# Patient Record
Sex: Female | Born: 1975 | Race: Black or African American | Hispanic: No | Marital: Married | Smoking: Never smoker
Health system: Southern US, Community
[De-identification: ages and names within clinical notes are randomized; demographics above are authoritative.]

## PROBLEM LIST (undated history)

## (undated) DIAGNOSIS — D6861 Antiphospholipid syndrome: Secondary | ICD-10-CM

## (undated) DIAGNOSIS — N6009 Solitary cyst of unspecified breast: Secondary | ICD-10-CM

## (undated) DIAGNOSIS — N938 Other specified abnormal uterine and vaginal bleeding: Secondary | ICD-10-CM

## (undated) DIAGNOSIS — E669 Obesity, unspecified: Secondary | ICD-10-CM

## (undated) DIAGNOSIS — D219 Benign neoplasm of connective and other soft tissue, unspecified: Secondary | ICD-10-CM

## (undated) DIAGNOSIS — I82409 Acute embolism and thrombosis of unspecified deep veins of unspecified lower extremity: Secondary | ICD-10-CM

## (undated) DIAGNOSIS — D649 Anemia, unspecified: Secondary | ICD-10-CM

## (undated) HISTORY — DX: Obesity, unspecified: E66.9

## (undated) HISTORY — DX: Solitary cyst of unspecified breast: N60.09

## (undated) HISTORY — DX: Benign neoplasm of connective and other soft tissue, unspecified: D21.9

## (undated) HISTORY — DX: Acute embolism and thrombosis of unspecified deep veins of unspecified lower extremity: I82.409

## (undated) HISTORY — PX: MYOMECTOMY: SHX85

---

## 2006-08-03 ENCOUNTER — Ambulatory Visit: Payer: Self-pay | Admitting: Internal Medicine

## 2006-08-07 ENCOUNTER — Ambulatory Visit: Payer: Self-pay | Admitting: Internal Medicine

## 2006-08-17 ENCOUNTER — Ambulatory Visit: Payer: Self-pay | Admitting: Internal Medicine

## 2006-09-21 ENCOUNTER — Ambulatory Visit (HOSPITAL_COMMUNITY): Admission: RE | Admit: 2006-09-21 | Discharge: 2006-09-21 | Payer: Self-pay | Admitting: Obstetrics & Gynecology

## 2006-09-21 ENCOUNTER — Ambulatory Visit: Payer: Self-pay | Admitting: Obstetrics & Gynecology

## 2006-09-27 ENCOUNTER — Ambulatory Visit: Payer: Self-pay | Admitting: Internal Medicine

## 2006-09-28 ENCOUNTER — Encounter: Payer: Self-pay | Admitting: Internal Medicine

## 2006-10-03 ENCOUNTER — Ambulatory Visit: Payer: Self-pay

## 2006-10-05 ENCOUNTER — Ambulatory Visit: Payer: Self-pay | Admitting: Cardiology

## 2006-10-09 ENCOUNTER — Encounter: Payer: Self-pay | Admitting: Family Medicine

## 2006-10-09 ENCOUNTER — Ambulatory Visit: Payer: Self-pay | Admitting: Family Medicine

## 2006-10-15 ENCOUNTER — Ambulatory Visit (HOSPITAL_COMMUNITY): Admission: RE | Admit: 2006-10-15 | Discharge: 2006-10-15 | Payer: Self-pay | Admitting: Obstetrics & Gynecology

## 2006-10-15 ENCOUNTER — Ambulatory Visit: Payer: Self-pay | Admitting: Family Medicine

## 2006-11-06 ENCOUNTER — Ambulatory Visit: Payer: Self-pay | Admitting: Family Medicine

## 2006-11-06 ENCOUNTER — Ambulatory Visit (HOSPITAL_COMMUNITY): Admission: RE | Admit: 2006-11-06 | Discharge: 2006-11-06 | Payer: Self-pay | Admitting: Obstetrics & Gynecology

## 2006-11-26 ENCOUNTER — Ambulatory Visit (HOSPITAL_COMMUNITY): Admission: RE | Admit: 2006-11-26 | Discharge: 2006-11-26 | Payer: Self-pay | Admitting: Obstetrics & Gynecology

## 2006-12-03 ENCOUNTER — Ambulatory Visit: Payer: Self-pay | Admitting: Gynecology

## 2006-12-18 ENCOUNTER — Ambulatory Visit: Payer: Self-pay | Admitting: Family Medicine

## 2006-12-24 ENCOUNTER — Ambulatory Visit (HOSPITAL_COMMUNITY): Admission: RE | Admit: 2006-12-24 | Discharge: 2006-12-24 | Payer: Self-pay | Admitting: Obstetrics & Gynecology

## 2007-01-03 ENCOUNTER — Ambulatory Visit: Payer: Self-pay | Admitting: Obstetrics & Gynecology

## 2007-01-22 ENCOUNTER — Ambulatory Visit: Payer: Self-pay | Admitting: Family Medicine

## 2007-01-22 ENCOUNTER — Ambulatory Visit (HOSPITAL_COMMUNITY): Admission: RE | Admit: 2007-01-22 | Discharge: 2007-01-22 | Payer: Self-pay | Admitting: Obstetrics & Gynecology

## 2007-02-19 ENCOUNTER — Ambulatory Visit (HOSPITAL_COMMUNITY): Admission: RE | Admit: 2007-02-19 | Discharge: 2007-02-19 | Payer: Self-pay | Admitting: Obstetrics & Gynecology

## 2007-02-20 ENCOUNTER — Ambulatory Visit: Payer: Self-pay | Admitting: Obstetrics & Gynecology

## 2007-02-24 ENCOUNTER — Inpatient Hospital Stay (HOSPITAL_COMMUNITY): Admission: AD | Admit: 2007-02-24 | Discharge: 2007-02-24 | Payer: Self-pay | Admitting: Obstetrics and Gynecology

## 2007-02-26 ENCOUNTER — Ambulatory Visit: Payer: Self-pay | Admitting: Family Medicine

## 2007-03-12 ENCOUNTER — Ambulatory Visit: Payer: Self-pay | Admitting: Family Medicine

## 2007-03-19 ENCOUNTER — Ambulatory Visit: Payer: Self-pay | Admitting: Family Medicine

## 2007-03-19 ENCOUNTER — Ambulatory Visit (HOSPITAL_COMMUNITY): Admission: RE | Admit: 2007-03-19 | Discharge: 2007-03-19 | Payer: Self-pay | Admitting: Obstetrics & Gynecology

## 2007-03-23 ENCOUNTER — Inpatient Hospital Stay (HOSPITAL_COMMUNITY): Admission: AD | Admit: 2007-03-23 | Discharge: 2007-03-23 | Payer: Self-pay | Admitting: Family Medicine

## 2007-03-23 ENCOUNTER — Ambulatory Visit: Payer: Self-pay | Admitting: Family Medicine

## 2007-03-28 ENCOUNTER — Inpatient Hospital Stay (HOSPITAL_COMMUNITY): Admission: RE | Admit: 2007-03-28 | Discharge: 2007-03-31 | Payer: Self-pay | Admitting: Family Medicine

## 2007-03-28 ENCOUNTER — Ambulatory Visit: Payer: Self-pay | Admitting: Family Medicine

## 2007-03-31 ENCOUNTER — Encounter: Payer: Self-pay | Admitting: Internal Medicine

## 2007-04-01 ENCOUNTER — Telehealth: Payer: Self-pay | Admitting: Family Medicine

## 2007-04-01 ENCOUNTER — Encounter: Admission: RE | Admit: 2007-04-01 | Discharge: 2007-05-01 | Payer: Self-pay | Admitting: Family Medicine

## 2007-04-02 ENCOUNTER — Ambulatory Visit: Payer: Self-pay | Admitting: Family Medicine

## 2007-04-02 LAB — CONVERTED CEMR LAB: Prothrombin Time: 23.6 s

## 2007-04-03 ENCOUNTER — Ambulatory Visit: Payer: Self-pay | Admitting: Obstetrics & Gynecology

## 2007-04-04 ENCOUNTER — Ambulatory Visit: Payer: Self-pay | Admitting: Internal Medicine

## 2007-04-04 LAB — CONVERTED CEMR LAB: Prothrombin Time: 16.8 s

## 2007-04-08 ENCOUNTER — Ambulatory Visit: Payer: Self-pay | Admitting: Gynecology

## 2007-04-09 ENCOUNTER — Ambulatory Visit: Payer: Self-pay | Admitting: Family Medicine

## 2007-04-10 DIAGNOSIS — Z8672 Personal history of thrombophlebitis: Secondary | ICD-10-CM

## 2007-04-11 ENCOUNTER — Ambulatory Visit: Admission: RE | Admit: 2007-04-11 | Discharge: 2007-04-11 | Payer: Self-pay | Admitting: Family Medicine

## 2007-04-16 ENCOUNTER — Ambulatory Visit: Payer: Self-pay | Admitting: Family Medicine

## 2007-04-16 ENCOUNTER — Ambulatory Visit: Payer: Self-pay | Admitting: Internal Medicine

## 2007-04-21 ENCOUNTER — Ambulatory Visit: Payer: Self-pay | Admitting: Surgery

## 2007-04-21 ENCOUNTER — Other Ambulatory Visit: Payer: Self-pay | Admitting: Family Medicine

## 2007-04-21 ENCOUNTER — Ambulatory Visit: Payer: Self-pay | Admitting: Oncology

## 2007-04-21 ENCOUNTER — Inpatient Hospital Stay (HOSPITAL_COMMUNITY): Admission: EM | Admit: 2007-04-21 | Discharge: 2007-04-23 | Payer: Self-pay | Admitting: Internal Medicine

## 2007-04-21 ENCOUNTER — Ambulatory Visit: Payer: Self-pay | Admitting: Internal Medicine

## 2007-04-22 ENCOUNTER — Encounter: Payer: Self-pay | Admitting: Internal Medicine

## 2007-04-23 ENCOUNTER — Ambulatory Visit: Payer: Self-pay | Admitting: Oncology

## 2007-05-02 ENCOUNTER — Encounter: Admission: RE | Admit: 2007-05-02 | Discharge: 2007-05-31 | Payer: Self-pay | Admitting: Family Medicine

## 2007-05-03 ENCOUNTER — Encounter (INDEPENDENT_AMBULATORY_CARE_PROVIDER_SITE_OTHER): Payer: Self-pay | Admitting: *Deleted

## 2007-05-09 ENCOUNTER — Ambulatory Visit: Payer: Self-pay | Admitting: Family Medicine

## 2007-05-22 ENCOUNTER — Encounter (INDEPENDENT_AMBULATORY_CARE_PROVIDER_SITE_OTHER): Payer: Self-pay | Admitting: *Deleted

## 2007-05-23 LAB — PROTEIN C ACTIVITY: Protein C Activity: 122 % (ref 75–133)

## 2007-06-01 ENCOUNTER — Encounter: Admission: RE | Admit: 2007-06-01 | Discharge: 2007-07-01 | Payer: Self-pay | Admitting: Family Medicine

## 2007-06-13 ENCOUNTER — Ambulatory Visit: Payer: Self-pay | Admitting: Obstetrics & Gynecology

## 2007-11-21 ENCOUNTER — Ambulatory Visit: Payer: Self-pay | Admitting: Oncology

## 2007-11-25 ENCOUNTER — Encounter: Payer: Self-pay | Admitting: Internal Medicine

## 2007-11-25 LAB — CBC & DIFF AND RETIC
Basophils Absolute: 0 10*3/uL (ref 0.0–0.1)
EOS%: 1 % (ref 0.0–7.0)
HCT: 39.7 % (ref 34.8–46.6)
HGB: 13.8 g/dL (ref 11.6–15.9)
IRF: 0.26 (ref 0.130–0.330)
MCH: 26.3 pg (ref 26.0–34.0)
MONO#: 0.4 10*3/uL (ref 0.1–0.9)
NEUT%: 40.9 % (ref 39.6–76.8)
Platelets: 273 10*3/uL (ref 145–400)
lymph#: 2 10*3/uL (ref 0.9–3.3)

## 2007-11-25 LAB — HEPARIN ANTI-XA: Heparin LMW: 1.06 IU/mL

## 2007-11-25 LAB — FERRITIN: Ferritin: 10 ng/mL (ref 10–291)

## 2007-12-04 ENCOUNTER — Encounter: Payer: Self-pay | Admitting: Family Medicine

## 2007-12-04 ENCOUNTER — Ambulatory Visit: Payer: Self-pay | Admitting: Family Medicine

## 2007-12-12 ENCOUNTER — Ambulatory Visit: Payer: Self-pay | Admitting: Family Medicine

## 2007-12-12 ENCOUNTER — Encounter: Payer: Self-pay | Admitting: Internal Medicine

## 2008-01-23 ENCOUNTER — Ambulatory Visit (HOSPITAL_COMMUNITY): Admission: RE | Admit: 2008-01-23 | Discharge: 2008-01-23 | Payer: Self-pay | Admitting: Gynecology

## 2008-02-04 ENCOUNTER — Ambulatory Visit: Payer: Self-pay | Admitting: Family Medicine

## 2008-02-11 ENCOUNTER — Ambulatory Visit: Payer: Self-pay | Admitting: Family Medicine

## 2008-02-11 ENCOUNTER — Encounter (INDEPENDENT_AMBULATORY_CARE_PROVIDER_SITE_OTHER): Payer: Self-pay | Admitting: Internal Medicine

## 2008-03-24 ENCOUNTER — Ambulatory Visit (HOSPITAL_COMMUNITY): Admission: RE | Admit: 2008-03-24 | Discharge: 2008-03-24 | Payer: Self-pay | Admitting: Gynecology

## 2008-04-06 ENCOUNTER — Ambulatory Visit: Payer: Self-pay | Admitting: Oncology

## 2008-04-08 ENCOUNTER — Encounter: Payer: Self-pay | Admitting: Internal Medicine

## 2008-04-08 ENCOUNTER — Ambulatory Visit (HOSPITAL_COMMUNITY): Admission: RE | Admit: 2008-04-08 | Discharge: 2008-04-08 | Payer: Self-pay | Admitting: Oncology

## 2008-05-22 ENCOUNTER — Ambulatory Visit: Payer: Self-pay | Admitting: Family Medicine

## 2008-05-22 DIAGNOSIS — J069 Acute upper respiratory infection, unspecified: Secondary | ICD-10-CM | POA: Insufficient documentation

## 2008-05-25 ENCOUNTER — Ambulatory Visit: Payer: Self-pay | Admitting: Family Medicine

## 2008-05-25 DIAGNOSIS — J019 Acute sinusitis, unspecified: Secondary | ICD-10-CM

## 2009-02-25 ENCOUNTER — Encounter: Payer: Self-pay | Admitting: Obstetrics & Gynecology

## 2009-02-25 ENCOUNTER — Ambulatory Visit: Payer: Self-pay | Admitting: Obstetrics & Gynecology

## 2009-03-10 ENCOUNTER — Ambulatory Visit (HOSPITAL_COMMUNITY): Admission: RE | Admit: 2009-03-10 | Discharge: 2009-03-10 | Payer: Self-pay | Admitting: Obstetrics and Gynecology

## 2010-03-03 ENCOUNTER — Ambulatory Visit: Payer: Self-pay | Admitting: Family Medicine

## 2010-03-03 DIAGNOSIS — B359 Dermatophytosis, unspecified: Secondary | ICD-10-CM | POA: Insufficient documentation

## 2010-03-08 ENCOUNTER — Ambulatory Visit: Payer: Self-pay | Admitting: Obstetrics & Gynecology

## 2010-07-08 ENCOUNTER — Ambulatory Visit (HOSPITAL_COMMUNITY): Admission: RE | Admit: 2010-07-08 | Discharge: 2010-07-08 | Payer: Self-pay | Admitting: Obstetrics & Gynecology

## 2010-07-08 ENCOUNTER — Encounter: Admission: RE | Admit: 2010-07-08 | Discharge: 2010-07-08 | Payer: Self-pay | Admitting: Obstetrics & Gynecology

## 2010-09-19 ENCOUNTER — Encounter: Payer: Self-pay | Admitting: Oncology

## 2010-09-27 NOTE — Assessment & Plan Note (Signed)
Summary: RASH/DLO   Vital Signs:  Patient profile:   35 year old female Height:      68 inches Weight:      201.0 pounds BMI:     30.67 Temp:     98.6 degrees F oral Pulse rate:   88 / minute Pulse rhythm:   regular BP sitting:   110 / 80  (left arm) Cuff size:   regular  Vitals Entered By: Benny Lennert CMA Duncan Dull) (March 03, 2010 3:27 PM)  History of Present Illness: Chief complaint rash   35 year old female:  L arm and torso  several weeks, itchy, circular with central clearing daughter with dry, itchy scalp.   ROS: o/w no fever, chills, sweats  GEN: Well-developed,well-nourished,in no acute distress; alert,appropriate and cooperative throughout examination HEENT: Normocephalic and atraumatic without obvious abnormalities. No apparent alopecia or balding. Ears, externally no deformities PULM: Breathing comfortably in no respiratory distress EXT: No clubbing, cyanosis, or edema PSYCH: Normally interactive. Cooperative during the interview. Pleasant. Friendly and conversant. Not anxious or depressed appearing. Normal, full affect.   SKIN: circular elevated scale o nL forearm and torso  Allergies (verified): No Known Drug Allergies  Past History:  Past medical, surgical, family and social histories (including risk factors) reviewed, and no changes noted (except as noted below).  Past Medical History: Reviewed history from 04/10/2007 and no changes required. DVT--9/07  Past Surgical History: Reviewed history from 02/11/2008 and no changes required. DVT L--9/07--annd R --9/08 Myomectomy 3/07 C'secstion 7/08  Family History: Reviewed history from 04/10/2007 and no changes required. Father: Alive Mother: Alive Siblings: One brother, alive No CAD or HTN DM in Pat GM No breast or colon cancer No Hx DVT/PE  Social History: Reviewed history from 04/10/2007 and no changes required. 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

## 2011-01-10 NOTE — Assessment & Plan Note (Signed)
NAME:  [name], [name]            ACCOUNT NO.:  [id number]   MEDICAL RECORD NO.:  [id number]          PATIENT TYPE:  POB   LOCATION:  [facility]         FACILITY:  [facility]   PHYSICIAN:  Scheryl Darter, MD       DATE OF BIRTH:  Apr 27, 1976   DATE OF SERVICE:  02/25/2009                                  CLINIC NOTE   CHIEF COMPLAINT:  Need routine physical exam.   The patient is a 35 year old black female, gravida 1, para 1, last  menstrual period February 11, 2009.  She is not currently using any  contraception.  She has considered using Mirena in the past, still  unsure which she wants to use for birth control.  She is followed with a  4.8 x 3.9 cm left adnexal cyst and the repeat ultrasound March 24, 2008  showed that this was stable.  It was recommended that she follow up 6  months after that for another ultrasound.  She has no complaints of  pain.  Her cycles are regular and complains heavy flow.   PAST MEDICAL HISTORY:  1. Bilateral deep venous thrombosis.  2. Fibroid uterus.  3. Bilateral breast cyst.   SURGICAL HISTORY:  1. Myomectomy.  2. Cesarean section.   FAMILY HISTORY:  Significant for fibroids.   SOCIAL HISTORY:  The patient is married and she denies alcohol, tobacco,  or drug use.  She is a Education officer, museum.   MEDICATIONS:  None.   ALLERGIES:  No known drug allergies.   REVIEW OF SYSTEMS:  Negative for any breast pain, abdominal pain, heavy  bleeding, vaginal discharge, or urinary pain.   PHYSICAL EXAMINATION:  GENERAL:  The patient is in no acute distress and  affect is normal.  VITAL SIGNS:  Weight 199 pounds, height 5 feet 9 inches, BP 132/93,  pulse 80.  CHEST:  Clear.  HEART:  Regular rhythm.  No thyromegaly.  BREASTS:  Without masses and nontender.  ABDOMEN:  Soft, nontender, no mass.  EXTREMITIES:  Show no swelling.  She has some mild early venous stasis  changes around her left ankle.  PELVIC:  External genitalia, vagina, and cervix  normal.  Pap was done.  Uterus normal size.  No adnexal masses or tenderness.   IMPRESSION:  1. History of left ovarian cyst with possible thin septum  2. History of fibrocystic breast changes.   PLAN:  The patient will have a followup ultrasound of the pelvis.  She  needs a repeat bilateral mammogram.  She will notify us if she decides  she wants to schedule Mirena insertion that had been discussed before.      Scheryl Darter, MD     JA/MEDQ  D:  02/25/2009  T:  02/26/2009  Job:  [id number]

## 2011-01-10 NOTE — H&P (Signed)
[name], [name]            ACCOUNT NO.:  [id number]   MEDICAL RECORD NO.:  [id number]          PATIENT TYPE:  MAT   LOCATION:  [facility]                          [facility]ACILITY:  [facility]   PHYSICIAN:  Hollice Espy, M.D.DATE OF BIRTH:  [birth year]   DATE OF ADMISSION:  04/21/2007  DATE OF DISCHARGE:  04/21/2007                              HISTORY & PHYSICAL   ATTENDING PHYSICIAN:  Vikki Ports A. Felicity Coyer, M.D.   PRIMARY CARE PHYSICIAN:  Karie Schwalbe, M.D.   CHIEF COMPLAINT:  Left-sided groin pain.   HISTORY OF PRESENT ILLNESS:  The patient is a 35 year old African  American female who has had previous history of DVT and underwent a  cesarean section at 36 weeks on March 28, 2007.  She was discharged from  the hospital on March 31, 2007.  Her Coumadin, which had been held  during her pregnancy, was resumed at the time of discharge.  Postpartum  she was doing well with no complications.  However, over the last one to  two days, she started having problems with severe pain in her left  groin.  Again she has a previous history of DVT on the right lower  extremity.  She went to [facility] and there in the emergency  room,  she was evaluated and found to have by ultrasound an acute DVT  involving the right saphenous, common femoral, femoral, profundus, and  popliteal.  I spoke with the physician who was evaluating the patient  and after an INR was checked, it was found to be 3.2.  The patient has  failed outpatient therapy despite therapeutic Coumadin still developed  an acute DVT and was at high risk for possible pulmonary embolus.  In  addition, in discussion with the [facility] physician, there was no further OB  needs for this patient at this time, so it was felt best that she  transfer over to [facility] for further evaluation.  The patient was  arranged as a direct transfer by [facility] and brought to 4700 floor.  Currently she is doing well.  She complains of some left-sided groin  pain but otherwise she is doing well.  She denies any headaches, vision  changes, dysphagia, chest pain, palpitations, shortness of breath,  wheeze, cough, abdominal pain, hematuria, dysuria, constipation,  diarrhea. No focal extremity numbness, weakness or pain other than  described above.  Review of systems is otherwise negative.   PAST MEDICAL HISTORY:  1. History of a fibroid uterus, previous myomectomy.  2. DVT of the right lower extremity.  3. Intrauterine pregnancy, status post cesarean section at 36 weeks on      March 28, 2007.   MEDICATIONS:  1. Percocet p.r.n.  2. Colace 100 mg b.i.d.  3. Coumadin 5 mg at bedtime.  4. Lovenox 80 subcutaneously b.i.d. which she discontinued when she      was therapeutic on Coumadin.  5. Prenatal vitamins daily.   ALLERGIES:  No known drug allergies.   SOCIAL HISTORY:  She denies any tobacco, alcohol or drug use.   FAMILY HISTORY:  A grandmother was on Coumadin for unknown reasons.  PHYSICAL EXAMINATION:  VITAL SIGNS:  On admission, temperature 97, heart  rate 103, blood pressure 115/82, respirations 18, oxygen saturation 98%  on room air.  GENERAL:  The patient is alert and oriented x3, in no apparent distress.  HEENT:  Normocephalic, atraumatic.  Mucous membranes are moist.  No  carotid bruits.  HEART:  Regular rhythm.  Mild tachycardia.  LUNGS:  Clear to auscultation bilaterally.  ABDOMEN:  Soft, nontender, nondistended.  Positive bowel sounds.  Occasional high-pitched bowel sounds.  EXTREMITIES:  No clubbing or cyanosis.  She has a trace pitting edema on  the left side from the mid calf down.   LABORATORY DATA:  PT 34.1, INR 3.2.  White count 6.5, no shift.  Hemoglobin 11.1, hematocrit 35.  MCV 76, platelet count 368.  Urinalysis  unremarkable.   ASSESSMENT/PLAN:  1. Acute deep venous thrombosis despite therapeutic Coumadin level.      Clearly the patient has failed outpatient therapy.  Plan to admit      the patient,  monitor her on telemetry and I have asked for a      consult from interventional radiology for placement of a IVC      filter.  In addition, we will hold her Coumadin and let her levels      trend down to acceptable range for interventional radiology.  In      the meantime, the decision should be made as to whether or not to      resume the patient on Coumadin with placement of this filter.  Once      she is off Coumadin, we will consider as a possibility do genetic      screening  for clot formation although she has been recently      pregnant, but this does not explain why she was clot forming      despite therapeutic INR.  2. Status post pregnancy, stable.      Hollice Espy, M.D.  Electronically Signed     SKK/MEDQ  D:  04/22/2007  T:  04/22/2007  Job:  [id number]   cc:   Shelbie Proctor. Shawnie Pons, M.D.  Karie Schwalbe, MD

## 2011-01-10 NOTE — Assessment & Plan Note (Signed)
NAME:  [name], [name]            ACCOUNT NO.:  [id number]   MEDICAL RECORD NO.:  [id number]          PATIENT TYPE:  POB   LOCATION:  [facility]         FACILITY:  [facility]   PHYSICIAN:  Tinnie Gens, MD        DATE OF BIRTH:  1976/08/15   DATE OF SERVICE:  12/04/2007                                  CLINIC NOTE   CHIEF COMPLAINT:  Yearly exam.   HISTORY OF PRESENT ILLNESS:  The patient is a 35 year old gravida 1,  para 1, who is status post C-section for a previous myomectomy in August  2008.  Postpartum, she went on Coumadin.  She had a lot of trouble with  bleeding related to Lovenox plus Coumadin.  She had her postpartum visit  here and was going to come back for IUD insertion, but subsequently  developed a new blood clot in her opposite leg, the right one.  She was  hospitalized and has since been on Lovenox 120 mg subcu daily.  She  supposedly is on that until at least August.  She sees Dr. Darnelle Catalan with  Hematology/Oncology for this problem.  The patient does not use any  birth control, except occasional condom use.  She is not anxious to get  pregnant at this time.   The patient also complains of abdominal pain that comes with her cycle  and lasts approximately 4 days post cycle.  It has been going on since  her February cycle.  Her cycles are not heavy.  She has no significant  bleeding.   PAST MEDICAL HISTORY:  DVT.   PAST SURGICAL HISTORY:  1. Myomectomy.  2. C-section.   FAMILY HISTORY:  Significant for fibroid.   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   MEDICATIONS:  Lovenox 120 mg subcu daily.   ALLERGIES:  NONE KNOWN.   OBSTETRICAL HISTORY:  G1, P1, with one C-section in August 2008.   GYN HISTORY:  No history of abnormal Paps.   REVIEW OF SYSTEMS:  A 14 point review of systems was reviewed and is  negative for headache, vision changes, chest pain,  shortness of breath,  hemoptysis, breast discomfort, breast masses, abdominal pain, nausea,  vomiting, diarrhea, constipation, trouble passing her urine.  No vaginal  discharge.  She does report pain as described in the HPI.   PHYSICAL EXAMINATION:  VITAL SIGNS:  Are as noted in the chart.  GENERAL:  She is a well-developed, well-nourished female in no acute  distress.  HEENT:  Normocephalic, atraumatic.  Sclerae anicteric.  NECK:  Supple.  Normal thyroid.  LUNGS:  Clear bilaterally.  CV:  Regular rate and rhythm.  No rubs, gallops or murmurs.  ABDOMEN:  Soft, nontender, nondistended, C-section scar is well-healed.  EXTREMITIES:  No cyanosis.  Trace edema.  She has vascular changes,  especially in the left greater than right leg with dilation of small  capillary vessels on the foot and ankle.  BREASTS:  Symmetric with everted nipples.  No masses.  No  supraclavicular or axillary adenopathy.  GU:  Normal external  female genitalia, BUS normal.  Vagina is pink and  rugated.  Cervix is nulliparous and anterior.  The uterus is retroverted  and firm, but not overly enlarged.  No adnexal mass or tenderness was  appreciated.   IMPRESSION:  1. Physical examination.  2. Cycle related low pelvic pain for the past to 2 months.  3. Undesired fertility.   PLAN:  1. Pap smear today.  2. Pelvic sonogram.  3. Info given about Mirena versus copper T.  The patient is to return      with her cycle for insertion of this.           ______________________________  Tinnie Gens, MD     TP/MEDQ  D:  12/04/2007  T:  12/04/2007  Job:  [id number]

## 2011-01-10 NOTE — Discharge Summary (Signed)
NAME:  [name], [name]            ACCOUNT NO.:  [id number]   MEDICAL RECORD NO.:  [id number]          PATIENT TYPE:  INP   LOCATION:  6711                         FACILITY:  [facility]   PHYSICIAN:  Valerie A. Felicity Coyer, MDDATE OF BIRTH:  October 29, 1975   DATE OF ADMISSION:  04/21/2007  DATE OF DISCHARGE:                               DISCHARGE SUMMARY   DISCHARGE DIAGNOSIS:  1. Extensive right lower extremity DVT, question hypercoagulable      state, question Coumadin failure, to continue solo Lovenox therapy      without Coumadin until follow-up with hematology, see below.  2. Small bilateral PEs by CT angio 08/25 asymptomatic without chest,      pain without shortness of breath, without hypoxia or pleurisy.  3. Anemia multifactorial.  4. Recent postpartum state necessitating delivery July 31.  Discharge      medications include Lovenox 80 mg subcu q.12h until supply gone,      then to begin Lovenox 120 mg subcu q.24 h for planned therapy of      approximately 1 year anticoagulation, or as to be determined by      hematologist Dr. Darnelle Catalan  5. Discontinue Coumadin.  6. Tylenol #3 one to two p.o. q.4 h p.r.n. pain.  The patient      understands she is to discontinue any form of Coumadin and will not      resume any form of oral contraceptive pills and postpartum state.   CONSULTATIONS THIS HOSPITALIZATION:  Hematology, Dr. Marikay Alar Magrinat.  Hospital follow-up will be with primary care physician Dr. Tillman Abide.  The patient to call for routine follow-up with the next 3-4  weeks, also to follow up with hematologist, Dr. Darnelle Catalan in mid  September, presumably September 22.  The office will call and confirm  this day and time with the patient later this week.   CONDITION ON DISCHARGE:  Hemodynamically stable, asymptomatic and pain  controlled, with the understanding of need for treatment and plans for  follow-up regarding anticoagulation issues.   HOSPITAL COURSE BY PROBLEM:  1. Acute  DVT.  The patient is a 35 year old woman with previous      history of DVT in September 2007 prepregnancy who was then treated      with twice daily Lovenox throughout her pregnancy until cesarean      delivery recently July 31, at which time she was then resumed on      p.o. Coumadin.  She says her doses have been variable due to      fluctuating INR sometimes too high and sometimes too low, most      recently 2 levels of INR 1.4 on August 19.  However, in 3 days      prior to presentation at this time she began to develop an ache and      pain in her right groin similar to that of her previous DVT on the      left side last summer so she went to [facility] for      evaluation where she was diagnosed with extensive DVT despite  having at that time an INR 3.3.  She was referred to [facility] for further evaluation and treatment for presumed failure      of Coumadin therapy with an acute DVT.  Initially plans were      arranged for an IVC filter,  however due to concerns that this      would not adequately prevent development of thromboembolism and      only prevent symptomatic pulmonary embolism she was continued on      full-dose Lovenox as well as Coumadin pending a hematology      evaluation.  Dr. Darnelle Catalan kindly saw the patient the evening of      August 25 who agreed that this may not represent a true Coumadin      failure and recommended further blood testing of her      hypercoagulable state as far as the Factor II levels  and for a      lupus anticoagulant given her elevated PTT as well.  He recommended      discontinuation of Coumadin altogether at this time and continuing      full-dose Lovenox every 24 hours for the time course at least 1      year, at which point further decisions will be made regarding      observation off anticoagulation versus continue lifelong treatment.      He agreed there was no clear indication for a filter at this time      and  felt the patient was stable for discharge home to continue this      treatment of full Lovenox therapy with outpatient follow-up in the      next several weeks to follow up on these lab tests for further      establishment of planned follow-up. Given the patient's newborn      daughter and hemodynamic and pulmonary stability she is anxious for      discharge home.  For completeness of evaluation we did do a CT test      which also confirmed  bilateral small PEs which are generally      asymptomatic as noted above, which was then discussed with the      patient who understands diagnosis and need for absolute compliance      with Lovenox therapy as instructed.  All this being said, we have      agreed for discharge home to continue general bedrest with slow      increase in activity, maintaining full Lovenox therapy at this time      and close followup with hematology who will decide on further      management.  Coumadin will be discontinued at this time and she      will continue Tylenol #3 p.r.n. pain.  This has been discussed with      the patient as well as consultant Dr. Darnelle Catalan prior to discharge.      Valerie A. Felicity Coyer, MD  Electronically Signed     VAL/MEDQ  D:  04/23/2007  T:  04/23/2007  Job:  [id number]

## 2011-01-10 NOTE — Discharge Summary (Signed)
NAME:  [name], [name]            ACCOUNT NO.:  [id number]   MEDICAL RECORD NO.:  [id number]          PATIENT TYPE:  INP   LOCATION:  9124                          [facility]ACILITY[facility]   PHYSICIAN:  Tanya S. Shawnie Pons, M.D.   DATE OF BIRTH:  [birth year]   DATE OF ADMISSION:  03/28/2007  DATE OF DISCHARGE:  03/31/2007                               DISCHARGE SUMMARY   FINAL DIAGNOSES:  1. Intrauterine pregnancy at 36 weeks.  2. Previous myomectomy.  3. History of a deep vein thrombosis.  4. Fibroid uterus, previous myomectomy.   PERTINENT LABS:  A preoperative hemoglobin of 12.2, postoperative  hemoglobin of 9.8, normal PT, PTT, INR and bleeding time.  Blood type is  B positive, RPR is nonreactive.   REASON FOR ADMISSION:  Briefly, patient is a 35 year old gravida 1 who  had previous DVT on oral contraceptives who then got pregnant 3 months  after her DVT and stopped her Coumadin and was on Lovenox for this  pregnancy after propagation of the clot.  The patient had a history of  previous myomectomy and needed a cesarean section.  Given the previous  myomectomy it was felt that cesarean section should be done early and  this was done at 36 weeks.   HOSPITAL COURSE:  The patient was admitted, underwent primary low  transverse C-section without difficulty, which resulted in a viable  female infant.  Postoperatively, she was transferred to the floor where  she was ambulating well, her Foley was discontinued, she was able to  void without difficulty, she was tolerating p.o.'s and had restarted her  Lovenox and Coumadin.  These were restarted approximately 18 hours post  surgery.  She tolerated both medications well and is discharged home on  postoperative day #3.   DISCHARGE DISPOSITION AND CONDITION:  The patient is discharged home in  good condition.  Followup care will be at [facility] office in 6 weeks  for a postpartum check and a visit April 02, 2007, at 11:30, with her  primary care  doctor at [facility], to follow her INR, which should be  ready to be checked at that point and to essentially discontinue her  Lovenox.   DISCHARGE MEDICATIONS:  1. Percocet 5/325, 1-2 p.o. q.4-6h. p.r.n. pain, #48.  2. Colace 100 mg one p.o. b.i.d.  3. Coumadin 5 mg p.o. q.h.s.  4. Lovenox 80 mg subcu b.i.d.  5. Prenatal vitamin one p.o. daily.   She also has pain management followup arranged at her primary care  physician office as well.  Staples were discontinued prior to discharge  and wound care is discussed with this patient.      Shelbie Proctor. Shawnie Pons, M.D.  Electronically Signed     TSP/MEDQ  D:  03/31/2007  T:  03/31/2007  Job:  [id number]

## 2011-01-10 NOTE — Assessment & Plan Note (Signed)
NAME:  [name], [name]            ACCOUNT NO.:  [id number]   MEDICAL RECORD NO.:  [id number]          PATIENT TYPE:  POB   LOCATION:  [facility]         FACILITY:  [facility]   PHYSICIAN:  Scheryl Darter, MD       DATE OF BIRTH:  04-Jan-1976   DATE OF SERVICE:  03/08/2010                                  CLINIC NOTE   The patient comes for her yearly exam.  The patient is a 35 year old  black female gravida 1, para 1, last menstrual period February 12, 2010, who  returns for her yearly examination.  She is not using any birth control  method currently and is considering whether she would like to conceive  again.  The patient has known history of fibroid uterus since she has  had left adnexal cyst in the past.  Last ultrasound was done on March 10, 2009, which showed physiologic bilateral ovarian cyst and a 3.3 x 2.9 cm  left posterior fundal intramural fibroid.  She says her menses actually  gotten a bit lighter and lasted about 4 days and are fairly regular.   PAST MEDICAL HISTORY:  1. Bilateral DVT  2. Fibroid uterus.  3. Bilateral breast cyst.   PAST SURGICAL HISTORY:  1. Myomectomy.  2. Cesarean section.   FAMILY HISTORY:  Fibroid uterus.  She says her father has high  cholesterol.  She denies any family history of hypertension.   SOCIAL HISTORY:  The patient is married.  She denies alcohol, tobacco or  drug use.  She is a Education officer, museum.   MEDICATIONS:  Clotrimazole topical that she is using for ringworm.   ALLERGIES:  No known drug allergies.   REVIEW OF SYSTEMS:  The patient is being treated for ringworm on her  abdomen and her left arm.  Her child also has ringworm.  Denies any  breast pain, abdominal pain, heavy bleeding, vaginal discharge, or  dysuria.   PHYSICAL EXAMINATION:  GENERAL:  The patient in no acute distress.  VITAL SIGNS:  Weight is 202 pounds, height 5 feet 9 inches, blood  pressure 143/90.  CHEST:  Clear.  HEART:  Regular rate and rhythm.  NECK:  No thyromegaly.  BREASTS:  Symmetric.  No mass or tenderness.  No axillary  lymphadenopathy.  ABDOMEN:  Soft, nontender, no mass, well healed, no transverse incision.  EXTREMITIES:  Some superficial venous stasis especially in the left  lower extremity.  PELVIC:  External genitalia, vagina and cervix are normal.  Pap was  done.  Uterus about 8-week size, nontender.  No adnexal masses.   IMPRESSION:  1. History of fibroid uterus.  2. History of deep vein thrombosis.  3. History of breast cyst.   PLAN:  Plan is to repeat ultrasound scheduled.  She also has screening  mammogram.  Followup for breast cyst.  Recommend that she consider  seeing a vein specialist due to abnormal vein especially over the left  leg.  She has had DVT in the past.      Scheryl Darter, MD     JA/MEDQ  D:  03/08/2010  T:  03/09/2010  Job:  [id number]

## 2011-01-10 NOTE — Consult Note (Signed)
NAME:  [name], [name]            ACCOUNT NO.:  [id number]   MEDICAL RECORD NO.:  [id number]          PATIENT TYPE:  POB   LOCATION:  [facility]                          FACILITY:  [facility]   PHYSICIAN:  Raymond Gurney C. Magrinat, M.D.DATE OF BIRTH:  1975/12/30   DATE OF CONSULTATION:  DATE OF DISCHARGE:                                 CONSULTATION   HISTORY OF PRESENT ILLNESS:  [name] is a 35 year old  [place] woman referred by Dr. Felicity Coyer for evaluation and treatment  in the setting of hypercoagulability.   The patient had a left lower extremity DVT September 2007 and now has a  postpartum DVT in the setting of a therapeutic PT/INR.   That is the basic story, but in more detail, there are some suggestive  details.  The patient had a myomectomy in March of 2007 which is  described as extensive.  After the surgery, she developed pain in the  left groin/left leg area and she had this in the middle of the summer  around June and July.  She was not diagnosed with her left lower  extremity clot until September.  What this suggests is there might have  been some local change in the left leg venous drainage area from the  surgery, whether this was damage to a blood vessel, scar tissue impeding  regular flow from the vascular bed or some other possibility.   At any rate the patient was coumadinized and was on Coumadin for a  couple of months before her pregnancy developed and she was switched to  Lovenox.  She did very well through the pregnancy and delivered by  cesarean section March 28, 2007.  She was then restarted appropriately on  Lovenox, and the Coumadin was started under cover of Lovenox.  She was  followed through [facility] and she tells me the Lovenox  was stopped and the Coumadin INR was therapeutic.  However, she also  tells me that it was low there a little after the Lovenox was stopped,  which certainly happens.  At any rate, the patient presented to the  emergency room with worsening problems in the left leg yesterday and was  found to have progression of her DVT in the left leg.  Her INR at that  point was 3.2.   The questions then are whether the patient needs a filter, whether she  needs lifelong coumadinization and what further tests need to be sent  for further evaluation.   PAST MEDICAL HISTORY:  Significant only for the history of fibroids and  she had again an extensive myomectomy I believe at [facility].  I do not have those records, but will try to obtain them.   FAMILY HISTORY:  There is a vague memory that possibly the patient's  mother's mother may have had a clot, but this is really just as vague as  it sounds.  The patient's father is 63 years old, is present this  evening.  The patient's mother 43 years old is present tonight.  Neither  of them has any history of clotting.  The patient's mother in  particular  had two successful pregnancies with no difficulties.  The patient's  brother also has no clotting problems.   GYNECOLOGIC HISTORY:  The patient is G X P 1.  As stated, she just had  her child a March 28, 2007, the delivery itself being through cesarean.   SOCIAL HISTORY:  She teaches seventh grade English.  Her husband [name]  also present tonight teaches computer systems.  Their daughter [name] who  is now almost a month old is present, alert, very pretty and smart  looking.  In addition there were two aunts and the patient's brother in  the room.  None was aware of any clotting history in the family.   REVIEW OF SYSTEMS:  Her pain is controlled on her current medications.  There has been no bleeding with her Coumadin.  She has a history of oral  contraceptives for a few months with no clotting complications at that  time.  Otherwise review of systems is noncontributory.   PHYSICAL EXAMINATION:  VITAL SIGNS:  Her temperature is 98.5, pulse 101,  respiratory rate 20 and blood pressure 117/80.  Her  room air saturation  is 100%.  HEENT:  The sclerae are not icteric.  NECK:  I do not palpate any adenopathy.  CHEST:  The lungs were auscultated anteriorly with no crackles or  wheezes noted.  HEART:  Regular rate and rhythm.  ABDOMEN:  Benign.   ALLERGIES:  No known drug allergies.   MEDICATIONS:  1. Prior to admission she was on Coumadin 5 mg at bedtime.  2. Percocet as needed.  3. Colace as needed.  4. Prior to that she was on Lovenox 80 mg subcutaneously b.i.d.   LABORATORY DATA:  Labs on admission showed a white cell count of 6.5,  hemoglobin 11.1, MCV 76.4 and platelets 368,000.  On April 22, 2007 she  had a sodium of 140, potassium 3.6, chloride 102, CO2 28, glucose 97,  BUN 6, creatinine 0.76 and calcium 9.1.  Her PTT was 66 this morning on  Lovenox, not unfractionated heparin.  Her PTT this morning was 3.3.  She  had a bleeding time March 27, 2007 which was 5.  She had a normal PT and  PTT just before delivery.   IMPRESSION AND PLAN:  A 35 year old [place] woman with a history of  left lower extremity deep venous thrombosis September 2007, now with  progression of the deep venous thrombosis in the setting of a  therapeutic prothrombin time/international normalized ratio.   First of all as discussed above, there is no family history whatsoever  of clotting diathesis and the patient herself took oral contraceptives  for many months without complications.  I think the possible chance of  an inheritable cause being found is low.  We will send the appropriate  tests (the protein CNS at this point would not be informative) to check  for the most common inheritable conditions.  Again, my expectation is  these will not be informative.   The patient may have had some trauma, damage or otherwise scarring in  the drainage of the left lower extremity which may have caused her  original clot.  That would then not be an unprovoked clot.  The clot  progression of course we have  two reasons, namely cesarean section which  is a form of trauma even those well controlled and secondly the  pregnancy itself, and thirdly the fact that she already had a clot in  that leg.  It  is of concern that she may have had clot progression and a  therapeutic INR, but she tells me there may have been some low INRs  after she went off the Lovenox and we will have to get the records from  [place].   I do not see a clear indication for a filter in this patient.  Usually  those are placed short term to facilitate a procedure where the patient  cannot be anticoagulated.  The big question with Ms. Nantz is how  long should she be anticoagulated.   The initial answer is at least a year she should be on the Lovenox.  I  would do it once a day and do that for a year.  She will need a bone  density at some point in the next couple of months and she should be on  appropriate osteoporosis prophylaxis.   After a year, the question is should we gone on for lifelong  anticoagulation or should we go for observation with perhaps coverage  with Lovenox for periods of greater risk (for example another pregnancy,  a long trip, post trauma, or postop.)  We can make that decision at that  time although at this point my __________  would be for observation with  close monitoring after a year.   She may be discharged at your discretion.  I will follow her in my  clinic in a few weeks to discuss these plans.  Please feel free to  contact me further as needed.      Valentino Hue. Magrinat, M.D.  Electronically Signed     GCM/MEDQ  D:  04/22/2007  T:  04/23/2007  Job:  [id number]   cc:   Vikki Ports A. Felicity Coyer, MD  Shelbie Proctor Shawnie Pons, M.D.

## 2011-01-10 NOTE — Op Note (Signed)
NAME:  KAYAL, MULA            ACCOUNT NO.:  [id number]   MEDICAL RECORD NO.:  [id number]          PATIENT TYPE:  INP   LOCATION:  9124                          [facility]ACILITY:  [facility]   PHYSICIAN:  Tanya S. Shawnie Pons, M.D.   DATE OF BIRTH:  [birth year]   DATE OF PROCEDURE:  03/28/2007  DATE OF DISCHARGE:                               OPERATIVE REPORT   PREOPERATIVE DIAGNOSIS:  Intrauterine pregnancy at 37 weeks, previous  myomectomy, fibroid uterus, and deep venous thrombosis.   POSTOPERATIVE DIAGNOSIS:  Intrauterine pregnancy at 37 weeks, previous  myomectomy, fibroid uterus, and deep venous thrombosis.   PROCEDURE:  Primary low transverse cesarean section.   SURGEON:  Shelbie Proctor. Shawnie Pons, M.D.   ASSISTANT:  None.   ANESTHESIA:  Spinal local.   FINDINGS:  A viable female infant.  Apgars are 9 and 9.  Weight is 6  pounds, 2 ounces.   SPECIMENS:  Placenta to labor and delivery.   ESTIMATED BLOOD LOSS:  Approximately 1000 cc.   COMPLICATIONS:  None were obvious.   REASON FOR PROCEDURE:  Briefly, patient is a 35 year old gravida 1 who  had a history of a myomectomy with a fairly extensive dissection in the  body of the uterus.  It was felt she would be safer delivered by C-  section.  She is at 36 weeks.  She also has a history of a DVT that she  acquired approximately three months prior to pregnancy on oral  contraceptives, which propagated during pregnancy when she was on no  anticoagulation.  She has been off her Lovenox for approximately 36  hours.   PROCEDURE:  Patient is taken to the OR.  She is placed in a supine  position with a left lateral tilt.  She is prepped and draped in the  usual sterile fashion.  The anesthesia was felt to be adequate.  A  Pfannenstiel incision was made through an old scar.  The incision was  carried down to the underlying fascia which was incised in the midline.  The fascia incision was extended laterally sharply.  Kocher clamps were  used to elevate  the superior edge of the fascia off the underlying  rectus, and sharp dissection was used to remove the fascia from the  rectus in the midline.  The peritoneal cavity was then entered bluntly,  and the peritoneal opening extended with the electrocautery laterally  high on the peritoneum above the bladder.  A self-retaining retractor  was then placed inside the incision.  The bladder was noted to be high  up on the uterus.  A bladder flap was created, and a knife was used to  make a low transverse incision on the uterus.  The low uterine segment  was not well developed, and this incision was very thick.  Once the  amniotic cavity was entered, rupture of membranes revealed clear fluid.  The infant was in a vertex position and was brought out through the  incision.  The infant was bulb-suctioned on the abdomen.  The cord was  clamped x2.  A spontaneous cry was heard.  The infant was taken  to the  waiting peds.  The placenta was then delivered manually.  The uterine  cavity cleaned with dry lap pads.  The edge of the uterine incision was  grasped with a ring forceps, and the uterine incision closed with a 0  Vicryl suture in a locked running fashion.  Any areas of major  hemorrhage were treated with over-sewing and figure-of-eight.  The edges  of the bladder flap were treated with cauterization until hemostasis was  felt to be adequate.  The attention was then turned to the rectus, and  any bleeders were cauterized.  The fascia was closed with a 0 Vicryl  suture in a running fashion.  The subcutaneous tissue was irrigated, and  any bleeders.  The same closed using clips.  Then 30 cc of 0.25%  Marcaine was injected, and a pressure dressing was applied.  The patient  was awakened and taken to the recovery room in stable condition.  The  infant to the newborn nursery, also stable.      Shelbie Proctor. Shawnie Pons, M.D.  Electronically Signed     TSP/MEDQ  D:  03/28/2007  T:  03/29/2007  Job:  [id number]

## 2011-06-09 LAB — URINALYSIS, ROUTINE W REFLEX MICROSCOPIC
Glucose, UA: NEGATIVE
Leukocytes, UA: NEGATIVE
Nitrite: NEGATIVE
Protein, ur: 30 — AB
Specific Gravity, Urine: 1.01
Specific Gravity, Urine: >1.046 — ABNORMAL HIGH
Urobilinogen, UA: 4 — ABNORMAL HIGH
pH: 6.5

## 2011-06-09 LAB — BASIC METABOLIC PANEL
BUN: 6
CO2: 28
Calcium: 9.1
Chloride: 102
Creatinine, Ser: 0.76
Glucose, Bld: 97

## 2011-06-09 LAB — URINE MICROSCOPIC-ADD ON

## 2011-06-09 LAB — CULTURE, BLOOD (ROUTINE X 2)
Culture: NO GROWTH
Culture: NO GROWTH

## 2011-06-09 LAB — CBC
HCT: 34.8 — ABNORMAL LOW
HCT: 30.4 — ABNORMAL LOW
Hemoglobin: 11.1 — ABNORMAL LOW
Hemoglobin: 9.6 — ABNORMAL LOW
MCHC: 32
MCHC: 31.9
MCV: 76.4 — ABNORMAL LOW
MCV: 75.3 — ABNORMAL LOW
Platelets: 368
Platelets: 293
RBC: 4.06
RDW: 23.6 — ABNORMAL HIGH
RDW: 23.5 — ABNORMAL HIGH
RDW: 23.8 — ABNORMAL HIGH

## 2011-06-09 LAB — APTT
aPTT: 81 — ABNORMAL HIGH
aPTT: 89 — ABNORMAL HIGH

## 2011-06-09 LAB — LUPUS ANTICOAGULANT PANEL
DRVVT: 122.5 — ABNORMAL HIGH (ref 36.1–47.0)
Drvvt confirmation: 1.67 — ABNORMAL HIGH (ref <1.18)
PTT Lupus Anticoagulant: 139.5 — ABNORMAL HIGH (ref 36.3–48.8)
PTTLA 4:1 Mix: 90.7 — ABNORMAL HIGH (ref 36.3–48.8)
PTTLA Confirmation: 15.8 — ABNORMAL HIGH (ref <8.0)

## 2011-06-09 LAB — ANTITHROMBIN III: AntiThromb III Func: 117

## 2011-06-09 LAB — FACTOR 2 ASSAY: Factor II Activity: 24 — ABNORMAL LOW

## 2011-06-09 LAB — DIFFERENTIAL
Basophils Absolute: 0
Eosinophils Absolute: 0.1
Lymphs Abs: 1
Monocytes Absolute: 0.5
Monocytes Relative: 7
Neutro Abs: 4.9

## 2011-06-09 LAB — PROTIME-INR
Prothrombin Time: 34.1 — ABNORMAL HIGH
Prothrombin Time: 34.4 — ABNORMAL HIGH
Prothrombin Time: 40.7 — ABNORMAL HIGH

## 2011-06-12 LAB — CBC
HCT: 29.6 — ABNORMAL LOW
HCT: 38.2
Hemoglobin: 11.1 — ABNORMAL LOW
Hemoglobin: 12.2
MCHC: 32
MCHC: 32.5
MCV: 73 — ABNORMAL LOW
MCV: 73.4 — ABNORMAL LOW
Platelets: 129 — ABNORMAL LOW
RBC: 4.06
RBC: 4.74
RBC: 5.2 — ABNORMAL HIGH
RDW: 30.3 — ABNORMAL HIGH
RDW: 30.6 — ABNORMAL HIGH
WBC: 6.2

## 2011-06-12 LAB — URINALYSIS, ROUTINE W REFLEX MICROSCOPIC
Glucose, UA: NEGATIVE
Ketones, ur: NEGATIVE
Nitrite: NEGATIVE
Specific Gravity, Urine: >1.03 — ABNORMAL HIGH
pH: 6

## 2011-06-12 LAB — COMPREHENSIVE METABOLIC PANEL
ALT: 12
AST: 18
Alkaline Phosphatase: 128 — ABNORMAL HIGH
Calcium: 9.4
GFR calc Af Amer: >60
Potassium: 4.1
Sodium: 136
Total Protein: 6.2

## 2011-06-12 LAB — CROSSMATCH
ABO/RH(D): B POS
Antibody Screen: NEGATIVE

## 2011-06-12 LAB — PROTIME-INR: INR: 0.9

## 2011-06-12 LAB — URINE MICROSCOPIC-ADD ON

## 2011-06-14 LAB — URINALYSIS, ROUTINE W REFLEX MICROSCOPIC
Glucose, UA: NEGATIVE
Leukocytes, UA: NEGATIVE
pH: 6

## 2011-06-14 LAB — URINE MICROSCOPIC-ADD ON

## 2011-07-19 ENCOUNTER — Ambulatory Visit: Payer: Self-pay | Admitting: Obstetrics & Gynecology

## 2011-08-08 ENCOUNTER — Ambulatory Visit: Payer: Self-pay | Admitting: Obstetrics and Gynecology

## 2011-08-08 DIAGNOSIS — Z01419 Encounter for gynecological examination (general) (routine) without abnormal findings: Secondary | ICD-10-CM

## 2012-04-10 ENCOUNTER — Telehealth: Payer: Self-pay | Admitting: Emergency Medicine

## 2012-04-10 ENCOUNTER — Ambulatory Visit (HOSPITAL_COMMUNITY): Admission: RE | Admit: 2012-04-10 | Payer: Self-pay | Source: Ambulatory Visit

## 2012-04-10 ENCOUNTER — Other Ambulatory Visit: Payer: Self-pay | Admitting: Emergency Medicine

## 2012-04-10 ENCOUNTER — Ambulatory Visit (HOSPITAL_COMMUNITY)
Admission: RE | Admit: 2012-04-10 | Discharge: 2012-04-10 | Disposition: A | Discharge status: Home / Self Care | Payer: Self-pay | Source: Ambulatory Visit | Attending: Oncology | Admitting: Oncology

## 2012-04-10 DIAGNOSIS — M7989 Other specified soft tissue disorders: Secondary | ICD-10-CM | POA: Insufficient documentation

## 2012-04-10 DIAGNOSIS — Z8672 Personal history of thrombophlebitis: Secondary | ICD-10-CM

## 2012-04-10 DIAGNOSIS — M79609 Pain in unspecified limb: Secondary | ICD-10-CM | POA: Insufficient documentation

## 2012-04-10 DIAGNOSIS — M79606 Pain in leg, unspecified: Secondary | ICD-10-CM

## 2012-04-10 NOTE — Progress Notes (Signed)
Bilateral:  No evidence of DVT, superficial thrombosis, or Baker's Cyst.  A fibrous string is visualized in the left common femoral and popliteal veins.

## 2012-04-10 NOTE — Telephone Encounter (Signed)
Patient states that her left lower extremity is "feeling weird". States onset 8/11 after wearing high heels. Patient states pain with palpation and some swelling. Patient states area is "a little warm".  Per Dr Darnelle Catalan, a doppler study was ordered and scheduled for today. Patient aware of plan and instructed to come to [facility] admitting now for registration.

## 2012-04-11 ENCOUNTER — Ambulatory Visit: Payer: Self-pay | Admitting: Obstetrics & Gynecology

## 2012-04-12 ENCOUNTER — Telehealth: Payer: Self-pay | Admitting: *Deleted

## 2012-04-12 NOTE — Telephone Encounter (Signed)
Message left by pt requesting results of doppler done 04/10/2012.  Return call number given as [phone].  Obtained reading- called to above number and transferred to verified VM.  Message left.

## 2012-04-15 ENCOUNTER — Ambulatory Visit: Payer: Self-pay | Admitting: Obstetrics & Gynecology

## 2012-04-18 ENCOUNTER — Encounter: Payer: Self-pay | Admitting: Internal Medicine

## 2012-04-19 ENCOUNTER — Ambulatory Visit (INDEPENDENT_AMBULATORY_CARE_PROVIDER_SITE_OTHER): Payer: BC Managed Care – PPO | Admitting: Internal Medicine

## 2012-04-19 ENCOUNTER — Ambulatory Visit: Payer: Self-pay | Admitting: Obstetrics and Gynecology

## 2012-04-19 DIAGNOSIS — Z0000 Encounter for general adult medical examination without abnormal findings: Secondary | ICD-10-CM

## 2012-04-20 DIAGNOSIS — Z0000 Encounter for general adult medical examination without abnormal findings: Secondary | ICD-10-CM | POA: Insufficient documentation

## 2012-04-20 NOTE — Progress Notes (Signed)
  Subjective:    Patient ID: [name], female    DOB: 25-Jan-1976, 36 y.o.   MRN: [id number]  HPI Visit cancelled and rescheduled   Review of Systems     Objective:   Physical Exam        Assessment & Plan:

## 2012-05-01 ENCOUNTER — Encounter: Payer: Self-pay | Admitting: Internal Medicine

## 2012-05-01 ENCOUNTER — Encounter: Payer: BC Managed Care – PPO | Admitting: Internal Medicine

## 2012-05-01 ENCOUNTER — Ambulatory Visit (INDEPENDENT_AMBULATORY_CARE_PROVIDER_SITE_OTHER): Payer: BC Managed Care – PPO | Admitting: Internal Medicine

## 2012-05-01 VITALS — BP 122/90 | HR 117 | Temp 99.0°F | Ht 68.0 in | Wt 192.0 lb

## 2012-05-01 DIAGNOSIS — Z0000 Encounter for general adult medical examination without abnormal findings: Secondary | ICD-10-CM

## 2012-05-01 NOTE — Progress Notes (Signed)
Subjective:    Patient ID: [name], female    DOB: 04-29-1976, 36 y.o.   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    No Known Allergies  Past Medical History  Diagnosis Date  . DVT (deep venous thrombosis)   . Fibroids   . Breast cyst     BILATERAL    Past Surgical History  Procedure Date  . Myomectomy   . Cesarean section     Family History  Problem Relation Age of Onset  . Diabetes Paternal Grandmother   . Heart disease Neg Hx   . Hypertension Neg Hx   . Cancer Neg Hx     breast or colon cancer    History   Social History  . Marital Status: Married    Spouse Name: N/A    Number of Children: 1  . Years of Education: N/A   Occupational History  . 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 Smoking status: Never Smoker   . Smokeless tobacco: Never Used  . Alcohol Use: Yes  . Drug Use: No  . Sexually Active: Not on file   Other Topics Concern  . Not on file   Social History Narrative  . No narrative on file   Review of Systems  Constitutional: Negative for fatigue and unexpected weight change.       Wears seat belt  HENT: Positive for congestion and rhinorrhea. Negative for hearing loss, dental problem and tinnitus.        Mild rhinorrhea--cold vs. Allergies.  Regular with dentist  Eyes: Negative for visual disturbance.       No diplopia or unilateral vision loss  Respiratory: Negative for cough, chest tightness and shortness of breath.   Cardiovascular: Positive for chest pain, palpitations and leg swelling.       Twinge of chest pain a couple of weeks ago----worried about blood clot. Seen at hematologist and ultrasound on legs negative Occ  heart races  Gastrointestinal: Negative for nausea, vomiting, abdominal pain, constipation and blood in stool.       No heartburn  Genitourinary: Negative for dysuria, difficulty urinating and dyspareunia.       Periods are longer now and more irregular No birth control---okay if she conceives again  Musculoskeletal: Negative for back pain, joint swelling and arthralgias.  Skin: Positive for rash.       Gets flaking on extensor forearms in cold weather Lighter areas and flaking lateral to right eye  Neurological: Negative for dizziness, syncope, weakness, light-headedness, numbness and headaches.       Arm tingling with the chest pain episode  Hematological: Negative for adenopathy. Bruises/bleeds easily.  Psychiatric/Behavioral: Negative for disturbed wake/sleep cycle and dysphoric mood. The patient is nervous/anxious.        Mild anxiety at times----normally a week before periods       Objective:   Physical Exam  Constitutional: She is oriented to person, place, and time. She appears well-developed and well-nourished. No distress.  HENT:  Head: Normocephalic and atraumatic.  Right Ear: External ear normal.  Left Ear: External ear normal.  Mouth/Throat: Oropharynx is clear and moist. No oropharyngeal exudate.       Mild  nasal congestion and rhinorrhea  Eyes: Conjunctivae and EOM are normal. Pupils are equal, round, and reactive to light.  Neck: Normal range of motion. Neck supple. No thyromegaly present.  Cardiovascular: Normal rate, regular rhythm, normal heart sounds and intact distal pulses.  Exam reveals no gallop.   No murmur heard. Pulmonary/Chest: Effort normal and breath sounds normal. No respiratory distress. She has no wheezes. She has no rales.  Abdominal: Soft. Bowel sounds are normal. There is no tenderness.  Musculoskeletal: Normal range of motion. She exhibits no edema and no tenderness.  Lymphadenopathy:    She has no cervical adenopathy.  Neurological: She is  alert and oriented to person, place, and time.  Skin: No rash noted. No erythema.  Psychiatric: She has a normal mood and affect. Her behavior is normal. Thought content normal.          Assessment & Plan:

## 2012-05-01 NOTE — Assessment & Plan Note (Signed)
Healthy School form done Discussed fitness efforts Rx given to check lipid and glucose (not fasting now)

## 2012-06-13 ENCOUNTER — Encounter: Payer: Self-pay | Admitting: Obstetrics & Gynecology

## 2012-06-13 ENCOUNTER — Ambulatory Visit (INDEPENDENT_AMBULATORY_CARE_PROVIDER_SITE_OTHER): Payer: BC Managed Care – PPO | Admitting: Obstetrics & Gynecology

## 2012-06-13 VITALS — BP 136/86 | HR 85 | Ht 69.0 in | Wt 213.0 lb

## 2012-06-13 DIAGNOSIS — Z01419 Encounter for gynecological examination (general) (routine) without abnormal findings: Secondary | ICD-10-CM

## 2012-06-13 DIAGNOSIS — D219 Benign neoplasm of connective and other soft tissue, unspecified: Secondary | ICD-10-CM

## 2012-06-13 DIAGNOSIS — Z1151 Encounter for screening for human papillomavirus (HPV): Secondary | ICD-10-CM

## 2012-06-13 DIAGNOSIS — N949 Unspecified condition associated with female genital organs and menstrual cycle: Secondary | ICD-10-CM

## 2012-06-13 DIAGNOSIS — N938 Other specified abnormal uterine and vaginal bleeding: Secondary | ICD-10-CM

## 2012-06-13 DIAGNOSIS — Z113 Encounter for screening for infections with a predominantly sexual mode of transmission: Secondary | ICD-10-CM

## 2012-06-13 DIAGNOSIS — N920 Excessive and frequent menstruation with regular cycle: Secondary | ICD-10-CM

## 2012-06-13 DIAGNOSIS — D259 Leiomyoma of uterus, unspecified: Secondary | ICD-10-CM

## 2012-06-13 DIAGNOSIS — Z124 Encounter for screening for malignant neoplasm of cervix: Secondary | ICD-10-CM

## 2012-06-13 NOTE — Patient Instructions (Addendum)
Menorrhagia Dysfunctional uterine bleeding is different from a normal menstrual period. When periods are heavy or there is more bleeding than is usual for you, it is called menorrhagia. It may be caused by hormonal imbalance, or physical, metabolic, or other problems. Examination is necessary in order that your caregiver may treat treatable causes. If this is a continuing problem, a D&C may be needed. That means that the cervix (the opening of the uterus or womb) is dilated (stretched larger) and the lining of the uterus is scraped out. The tissue scraped out is then examined under a microscope by a specialist (pathologist) to make sure there is nothing of concern that needs further or more extensive treatment. HOME CARE INSTRUCTIONS   If medications were prescribed, take exactly as directed. Do not change or switch medications without consulting your caregiver.  Long term heavy bleeding may result in iron deficiency. Your caregiver may have prescribed iron pills. They help replace the iron your body lost from heavy bleeding. Take exactly as directed. Iron may cause constipation. If this becomes a problem, increase the bran, fruits, and roughage in your diet.  Do not take aspirin or medicines that contain aspirin one week before or during your menstrual period. Aspirin may make the bleeding worse.  If you need to change your sanitary pad or tampon more than once every 2 hours, stay in bed and rest as much as possible until the bleeding stops.  Eat well-balanced meals. Eat foods high in iron. Examples are leafy green vegetables, meat, liver, eggs, and whole grain breads and cereals. Do not try to lose weight until the abnormal bleeding has stopped and your blood iron level is back to normal. SEEK MEDICAL CARE IF:   You need to change your sanitary pad or tampon more than once an hour.  You develop nausea (feeling sick to your stomach) and vomiting, dizziness, or diarrhea while you are taking your  medicine.  You have any problems that may be related to the medicine you are taking. SEEK IMMEDIATE MEDICAL CARE IF:   You have a fever.  You develop chills.  You develop severe bleeding or start to pass blood clots.  You feel dizzy or faint. MAKE SURE YOU:   Understand these instructions.  Will watch your condition.  Will get help right away if you are not doing well or get worse. Document Released: 08/14/2005 Document Revised: 11/06/2011 Document Reviewed: 04/03/2008 [facility] Patient Information 2013 [place], [place]. Fibroids Fibroids are lumps (tumors) that can occur any place in a woman's body. These lumps are not cancerous. Fibroids vary in size, weight, and where they grow. HOME CARE  Do not take aspirin.  Write down the number of pads or tampons you use during your period. Tell your doctor. This can help determine the best treatment for you. GET HELP RIGHT AWAY IF:  You have pain in your lower belly (abdomen) that is not helped with medicine.  You have cramps that are not helped with medicine.  You have more bleeding between or during your period.  You feel lightheaded or pass out (faint).  Your lower belly pain gets worse. MAKE SURE YOU:  Understand these instructions.  Will watch your condition.  Will get help right away if you are not doing well or get worse. Document Released: 09/16/2010 Document Revised: 11/06/2011 Document Reviewed: 09/16/2010 [facility] Patient Information 2013 [place], [place].

## 2012-06-14 DIAGNOSIS — N938 Other specified abnormal uterine and vaginal bleeding: Secondary | ICD-10-CM | POA: Insufficient documentation

## 2012-06-14 NOTE — Progress Notes (Signed)
Patient ID: [name], female   DOB: 11-07-1975, 36 y.o.   MRN: [id number] [name] is an 36 y.o. female. Pt presents for annual GYN exam.  Pt c/o irregular cycles.  H/o uterine fibroids.  Pt and partner are interested in conception.  Pertinent Gynecological History: Menses: usually lasting 10 to 14 days Bleeding: dysfunctional uterine bleeding Contraception: none DES exposure: denies Blood transfusions: unk Sexually transmitted diseases: no past history Last mammogram:has not has Last pap: normal Date: 02/2009 OB History: G?, P1   Menstrual History: Menarche age: 19 Patient's last menstrual period was 05/19/2012.    Past Medical History  Diagnosis Date  . DVT (deep venous thrombosis)   . Fibroids   . Breast cyst     BILATERAL    Past Surgical History  Procedure Date  . Myomectomy   . Cesarean section     Family History  Problem Relation Age of Onset  . Diabetes Paternal Grandmother   . Heart disease Neg Hx   . Hypertension Neg Hx   . Cancer Neg Hx     breast or colon cancer   No current outpatient prescriptions on file prior to visit.   Social History:  reports that she has never smoked. She has never used smokeless tobacco. She reports that she drinks alcohol. She reports that she does not use illicit drugs.  Allergies: No Known Allergies   ROS  Blood pressure 136/86, pulse 85, height 5\' 9"  (1.753 m), weight 213 lb (96.616 kg), last menstrual period 05/19/2012. Physical Exam Lungs: CV: Abd:  GU: EGBUS: no lesions Vagina: no blood in vault Cervix: no lesion; no mucopurulent d/c Uterus: enlarged with irregular contour, mobile Adnexa: no masses; non tender     Assessment: Annual GYN exam H/o symptomatic uterine fibroids in a pt who desires fertility and has a h/o a DVT on no meds presently  Plan: Pelvic sono F/u PAP with HPV and cx F/u 4 weeks to review sono and discuss conception      HARRAWAY-SMITH, Schneur Crowson 06/14/2012, 11:57  AM

## 2012-06-17 ENCOUNTER — Ambulatory Visit (INDEPENDENT_AMBULATORY_CARE_PROVIDER_SITE_OTHER): Payer: BC Managed Care – PPO | Admitting: Obstetrics & Gynecology

## 2012-06-17 ENCOUNTER — Encounter: Payer: Self-pay | Admitting: Obstetrics & Gynecology

## 2012-06-17 VITALS — BP 122/88 | HR 102 | Ht 69.0 in | Wt 212.0 lb

## 2012-06-17 DIAGNOSIS — D259 Leiomyoma of uterus, unspecified: Secondary | ICD-10-CM

## 2012-06-17 DIAGNOSIS — N938 Other specified abnormal uterine and vaginal bleeding: Secondary | ICD-10-CM

## 2012-06-17 DIAGNOSIS — N949 Unspecified condition associated with female genital organs and menstrual cycle: Secondary | ICD-10-CM

## 2012-06-17 DIAGNOSIS — N946 Dysmenorrhea, unspecified: Secondary | ICD-10-CM

## 2012-06-17 DIAGNOSIS — D219 Benign neoplasm of connective and other soft tissue, unspecified: Secondary | ICD-10-CM | POA: Insufficient documentation

## 2012-06-17 MED ORDER — MEDROXYPROGESTERONE ACETATE 10 MG PO TABS: 20.0000 mg | ORAL_TABLET | Freq: Every day | ORAL | Status: DC

## 2012-06-17 MED ORDER — DICLOFENAC SODIUM 75 MG PO TBEC: 75.0000 mg | DELAYED_RELEASE_TABLET | Freq: Two times a day (BID) | ORAL | Status: DC

## 2012-06-17 NOTE — Patient Instructions (Signed)
Uterine Fibroid A uterine fibroid is a growth (tumor) that occurs in a woman's uterus. This type of tumor is not cancerous and does not spread out of the uterus. A woman can have one or many fibroids, and the fiboid(s) can become quite large. A fibroid can vary in size, weight, and where it grows in the uterus. Most fibroids do not require medical treatment, but some can cause pain or heavy bleeding during and between periods. CAUSES  A fibroid is the result of a single uterine cell that keeps growing (unregulated), which is different than most cells in the human body. Most cells have a control mechanism that keeps them from reproducing without control.  SYMPTOMS   Bleeding.  Pelvic pain and pressure.  Bladder problems due to the size of the fibroid.  Infertility and miscarriages depending on the size and location of the fibroid. DIAGNOSIS  A diagnosis is made by physical exam. Your caregiver may feel the lumpy tumors during a pelvic exam. Important information regarding size, location, and number of tumors can be gained by having an ultrasound. It is rare that other tests, such as a CT scan or MRI, are needed. TREATMENT   Your caregiver may recommend watchful waiting. This involves getting the fibroid checked by your caregiver to see if the fibroids grow or shrink.   Hormonal treatment or an intrauterine device (IUD) may be prescribed.   Surgery may be needed to remove the fibroids (myomectomy) or the uterus (hysterectomy). This depends on your situation. When fibroids interfere with fertility and a woman wants to become pregnant, a caregiver may recommend having the fibroids removed.  HOME CARE INSTRUCTIONS  Home care depends on how you were treated. In general:   Keep all follow-up appointments with your caregiver.   Only take medicine as told by your caregiver. Do not take aspirin. It can cause bleeding.   If you have excessive periods and soak tampons or pads in a half hour or  less, contact your caregiver immediately. If your periods are troublesome but not so heavy, lie down with your feet raised slightly above your heart. Place cold packs on your lower abdomen.   If your periods are heavy, write down the number of pads or tampons you use per month. Bring this information to your caregiver.   Talk to your caregiver about taking iron pills.   Include green vegetables in your diet.   If you were prescribed a hormonal treatment, take the hormonal medicines as directed.   If you need surgery, ask your caregiver for information on your specific surgery.  SEEK IMMEDIATE MEDICAL CARE IF:  You have pelvic pain or cramps not controlled with medicines.   You have a sudden increase in pelvic pain.   You have an increase of bleeding between and during periods.   You feel lightheaded or have fainting episodes.  MAKE SURE YOU:  Understand these instructions.  Will watch your condition.  Will get help right away if you are not doing well or get worse. Document Released: 08/11/2000 Document Revised: 11/06/2011 Document Reviewed: 09/04/2011 [facility] Patient Information 2013 [place], [place].  Uterine Artery Embolization for Fibroids Uterine fibroids are non-cancerous (benign) smooth muscle tumors of the uterus. When they become large, they may produce symptoms of pain and bleeding. Fibroids are sometimes individually removed during surgery or removed with the uterus (hysterectomy).  One non-surgical treatment used to shrink fibroids is called uterine artery embolization. A specialist (interventional radiologist) uses a thin plastic hose(catheter) to inject  material that blocks off the blood supply to the fibroid. In time, this causes the fibroid to shrink. PROCEDURE  Under local anesthetic (a medication that numbs part of the body) the radiologist makes a small cut in the groin. A catheter is then inserted into the main artery of the leg. Using fluoroscopy,  your radiologist guides the catheter through the artery to the uterus. A series of images are taken while dye is injected. This is done to provide a road map of the blood supply to the uterus and fibroids. Tiny plastic spheres about the size of sand grains are then injected through the catheter. Metal coils may sometimes also be used to help block the artery. The particles lodge in tiny branches of the uterine artery that supplies blood to the fibroids. The procedure is repeated on the artery that supplies the other side of the uterus. The hospital stay is usually overnight. Normal activity can resume after about a week. Mild pain and cramping following the procedure is easily treated with medication and anti-inflammatory drugs. These usually last only a couple days.  RISKS AND COMPLICATIONS  Injury to the uterus from decreased blood supply may happen.  This could require removal of the uterus (hysterectomy).  Pain and bleeding can occur.  Infection and abscess (a cyst filled with pus).  A cyst filled with blood (hematoma).  Blood infection (septicemia).  Amenorrhea (no menstrual period).  Dying of tissue cells that cannot recover (necrosis) to the bladder or lips of the vagina.  Fistula (a connection between organs or from organ to the skin).  Blood clot in the lung (pulmonary embolus).  Rarely death. EXPECTED OUTCOME An ultrasound or MRI is done in 6 months to make sure the fibroids have shrunk. The fibroids usually shrink to about half their original size. In most cases these effects are long lasting.   The uterus also shrinks but does not die. You may not be able to get pregnant following this procedure.  It cannot be estimated what the effects of the procedure will be on menses. Usually there is less bleeding.  The procedure may cause premature menopause or loss of menstrual cycle. HOME CARE INSTRUCTIONS   Follow your caregiver's advice regarding medications given to you, diet,  activity and when to begin sexual activity.  See your caregiver for follow up care as directed.  Do not take aspirin it can cause bleeding. Only take over-the-counter or prescription medicines for pain, discomfort, or fever as directed by your caregiver.  Care for and change dressing as directed. SEEK MEDICAL CARE IF:   You develop a temperature of 102 F (38.9 C) or higher.  There is redness, swelling and pain around the wound.  You have pus draining from the wound.  You develop a rash. SEEK IMMEDIATE MEDICAL CARE IF:   You have bleeding from the wound.  You have difficulty breathing.  You develop chest pain.  You develop belly (abdominal) pain.  You develop leg pain.  You become dizzy and pass out. Document Released: 10/30/2005 Document Revised: 11/06/2011 Document Reviewed: 09/26/2007 [facility] Patient Information 2013 [place], [place].

## 2012-06-17 NOTE — Progress Notes (Signed)
History:  36 y.o. G1P1 here today for management of ongoing heavy menstrual period. Reports soaking through a pad every two hours, no LH, dizziness.  Also has a lot of cramping.  She was seen by Dr. Erin Fulling for her annual exam on 06/14/12 and pelvic ultrasound was ordered for evaluation of her DUB and fibroids.    The following portions of the patient's history were reviewed and updated as appropriate: allergies, current medications, past family history, past medical history, past social history, past surgical history and problem list. Pap smear done 06/14/12, results pending.  Review of Systems:  Pertinent items are noted in HPI.  Objective:  Physical Exam Blood pressure 122/88, pulse 102, height 5\' 9"  (1.753 m), weight 212 lb (96.163 kg), last menstrual period 05/12/2012. Deferred  Assessment & Plan:  Provera prescribed for patient to help with her heavy menstrual period (40 mg daily for 4 days then 20 mg daily). NSAIDs recommended for cramping; Diclofenac prescribed. Will follow up with ultrasound results and decide on further management. Bleeding precautions reviewed

## 2012-06-24 ENCOUNTER — Ambulatory Visit (HOSPITAL_COMMUNITY)
Admission: RE | Admit: 2012-06-24 | Discharge: 2012-06-24 | Disposition: A | Discharge status: Home / Self Care | Payer: BC Managed Care – PPO | Source: Ambulatory Visit | Attending: Obstetrics & Gynecology | Admitting: Obstetrics & Gynecology

## 2012-06-24 DIAGNOSIS — N920 Excessive and frequent menstruation with regular cycle: Secondary | ICD-10-CM

## 2012-06-24 DIAGNOSIS — D251 Intramural leiomyoma of uterus: Secondary | ICD-10-CM | POA: Insufficient documentation

## 2012-06-24 DIAGNOSIS — N854 Malposition of uterus: Secondary | ICD-10-CM | POA: Insufficient documentation

## 2012-06-24 DIAGNOSIS — D219 Benign neoplasm of connective and other soft tissue, unspecified: Secondary | ICD-10-CM

## 2012-07-08 ENCOUNTER — Encounter: Payer: Self-pay | Admitting: Family Medicine

## 2012-07-08 ENCOUNTER — Ambulatory Visit (INDEPENDENT_AMBULATORY_CARE_PROVIDER_SITE_OTHER): Payer: BC Managed Care – PPO | Admitting: Family Medicine

## 2012-07-08 ENCOUNTER — Ambulatory Visit (HOSPITAL_COMMUNITY): Payer: BC Managed Care – PPO

## 2012-07-08 VITALS — BP 138/90 | HR 81 | Ht 69.0 in | Wt 215.0 lb

## 2012-07-08 DIAGNOSIS — Z8672 Personal history of thrombophlebitis: Secondary | ICD-10-CM

## 2012-07-08 DIAGNOSIS — N979 Female infertility, unspecified: Secondary | ICD-10-CM

## 2012-07-09 LAB — FOLLICLE STIMULATING HORMONE: FSH: 0.7 m[IU]/mL

## 2012-07-09 LAB — TSH: TSH: 1.257 u[IU]/mL (ref 0.350–4.500)

## 2012-07-09 NOTE — Assessment & Plan Note (Signed)
Will check TSH, FSH and AMH level.  Order Semen analysis and HSG.  Pt. May elect for ovulation prediction kits.  I have given instruction on timing of intercourse, and normal cycle length.  She will f/u in 1 month for results.  May need referral to [facility].

## 2012-07-09 NOTE — Patient Instructions (Signed)
Infertility WHAT IS INFERTILITY?  Infertility is usually defined as not being able to get pregnant after trying for one year of regular sexual intercourse without the use of contraceptives. Or not being able to carry a pregnancy to term and have a baby. The infertility rate in the [place] is around 10%. Pregnancy is the result of a chain of events. A woman must release an egg from one of her ovaries (ovulation). The egg must be fertilized by the female sperm. Then it travels through a fallopian tube into the uterus (womb), where it attaches to the wall of the uterus and grows. A man must have enough sperm, and the sperm must join with (fertilize) the egg along the way, at the proper time. The fertilized egg must then become attached to the inside of the uterus. While this may seem simple, many things can happen to prevent pregnancy from occurring.  WHOSE PROBLEM IS IT?  About 20% of infertility cases are due to problems with the man (female factors) and 65% are due to problems with the woman (female factors). Other cases are due to a combination of female and female factors or to unknown causes.  WHAT CAUSES INFERTILITY IN MEN?  Infertility in men is often caused by problems with making enough normal sperm or getting the sperm to reach the egg. Problems with sperm may exist from birth or develop later in life, due to illness or injury. Some men produce no sperm, or produce too few sperm (oligospermia). Other problems include:  Sexual dysfunction.  Hormonal or endocrine problems.  Age. Female fertility decreases with age, but not at as young an age as female fertility.  Infection.  Congenital problems. Birth defect, such as absence of the tubes that carry the sperm (vas deferens).  Genetic/chromosomal problems.  Antisperm antibody problems.  Retrograde ejaculation (sperm go into the bladder).  Varicoceles, spematoceles, or tumors of the testicles.  Lifestyle can influence the number and  quality of a man's sperm.  Alcohol and drugs can temporarily reduce sperm quality.  Environmental toxins, including pesticides and lead, may cause some cases of infertility in men. WHAT CAUSES INFERTILITY IN WOMEN?   Problems with ovulation account for most infertility in women. Without ovulation, eggs are not available to be fertilized.  Signs of problems with ovulation include irregular menstrual periods or no periods at all.  Simple lifestyle factors, including stress, diet, or athletic training, can affect a woman's hormonal balance.  Age. Fertility begins to decrease in women in the early 30s and is worse after age 37.  Much less often, a hormonal imbalance from a serious medical problem, such as a pituitary gland tumor, thyroid or other chronic medical disease, can cause ovulation problems.  Pelvic infections.  Polycystic ovary syndrome (increase in female hormones, unable to ovulate).  Alcohol or illegal drugs.  Environmental toxins, radiation, pesticides, and certain chemicals.  Aging is an important factor in female infertility.  The ability of a woman's ovaries to produce eggs declines with age, especially after age 35. About one third of couples where the woman is over 35 will have problems with fertility.  By the time she reaches menopause when her monthly periods stop for good, a woman can no longer produce eggs or become pregnant.  Other problems can also lead to infertility in women. If the fallopian tubes are blocked at one or both ends, the egg cannot travel through the tubes into the uterus. Scar tissue (adhesions) in the pelvis may cause blocked   tubes. This may result from pelvic inflammatory disease, endometriosis, or surgery for an ectopic pregnancy (fertilized egg implanted outside the uterus) or any pelvic or abdominal surgery causing adhesions.  Fibroid tumors or polyps of the uterus.  Congenital (birth defect) abnormalities of the uterus.  Infection of the  cervix (cervicitis).  Cervical stenosis (narrowing).  Abnormal cervical mucus.  Polycystic ovary syndrome.  Having sexual intercourse too often (every other day or 4 to 5 times a week).  Obesity.  Anorexia.  Poor nutrition.  Over exercising, with loss of body fat.  DES. Your mother received diethylstilbesterol hormone when pregnant with you. HOW IS INFERTILITY TESTED?  If you have been trying to have a baby without success, you may want to seek medical help. You should not wait for one year of trying before seeing a health care provider if:  You are over 35.  You have reason to believe that there may be a fertility problem. A medical evaluation may determine the reasons for a couple's infertility. Usually this process begins with:  Physical exams.  Medical histories of both partners.  Sexual histories of both partners. If there is no obvious problem, like improperly timed intercourse or absence of ovulation, tests may be needed.   For a man, testing usually begins with tests of his semen to look at:  The number of sperm.  The shape of sperm.  Movement of his sperm.  Taking a complete medical and surgical history.  Physical examination.  Check for infection of the female reproductive organs. Sometimes hormone tests are done.   For a woman, the first step in testing is to find out if she is ovulating each month. There are several ways to do this. For example, she can keep track of changes in her morning body temperature and in the texture of her cervical mucus. Another tool is a home ovulation test kit, which can be bought at drug or grocery stores.  Checks of ovulation can also be done in the doctor's office, using blood tests for hormone levels or ultrasound tests of the ovaries. If the woman is ovulating, more tests will need to be done. Some common female tests include:  Hysterosalpingogram: An x-ray of the fallopian tubes and uterus after they are injected with  dye. It shows if the tubes are open and shows the shape of the uterus.  Laparoscopy: An exam of the tubes and other female organs for disease. A lighted tube called a laparoscope is used to see inside the abdomen.  Endometrial biopsy: Sample of uterus tissue taken on the first day of the menstrual period, to see if the tissue indicates you are ovulating.  Transvaginal ultrasound: Examines the female organs.  Hysteroscopy: Uses a lighted tube to examine the cervix and inside the uterus, to see if there are any abnormalities inside the uterus. TREATMENT  Depending on the test results, different treatments can be suggested. The type of treatment depends on the cause. 85 to 90% of infertility cases are treated with drugs or surgery.   Various fertility drugs may be used for women with ovulation problems. It is important to talk with your caregiver about the drug to be used. You should understand the drug's benefits and side effects. Depending on the type of fertility drug and the dosage of the drug used, multiple births (twins or multiples) can occur in some women.  If needed, surgery can be done to repair damage to a woman's ovaries, fallopian tubes, cervix, or uterus.  Surgery   or medical treatment for endometriosis or polycystic ovary syndrome. Sometimes a man has an infertility problem that can be corrected with medicine or by surgery.  Intrauterine insemination (IUI) of sperm, timed with ovulation.  Change in lifestyle, if that is the cause (lose weight, increase exercise, and stop smoking, drinking excessively, or taking illegal drugs).  Other types of surgery:  Removing growths inside and on the uterus.  Removing scar tissue from inside of the uterus.  Fixing blocked tubes.  Removing scar tissue in the pelvis and around the female organs. WHAT IS ASSISTED REPRODUCTIVE TECHNOLOGY (ART)?  Assisted reproductive technology (ART) is another form of special methods used to help infertile  couples. ART involves handling both the woman's eggs and the man's sperm. Success rates vary and depend on many factors. ART can be expensive and time-consuming. But ART has made it possible for many couples to have children that otherwise would not have been conceived. Some methods are listed below:  In vitro fertilization (IVF). IVF is often used when a woman's fallopian tubes are blocked or when a man has low sperm counts. A drug is used to stimulate the ovaries to produce multiple eggs. Once mature, the eggs are removed and placed in a culture dish with the man's sperm for fertilization. After about 40 hours, the eggs are examined to see if they have become fertilized by the sperm and are dividing into cells. These fertilized eggs (embryos) are then placed in the woman's uterus. This bypasses the fallopian tubes.  Gamete intrafallopian transfer (GIFT) is similar to IVF, but used when the woman has at least one normal fallopian tube. Three to five eggs are placed in the fallopian tube, along with the man's sperm, for fertilization inside the woman's body.  Zygote intrafallopian transfer (ZIFT), also called tubal embryo transfer, combines IVF and GIFT. The eggs retrieved from the woman's ovaries are fertilized in the lab and placed in the fallopian tubes rather than in the uterus.  ART procedures sometimes involve the use of donor eggs (eggs from another woman) or previously frozen embryos. Donor eggs may be used if a woman has impaired ovaries or has a genetic disease that could be passed on to her baby.  When performing ART, you are at higher risk for resulting in multiple pregnancies, twins, triplets or more.  Intracytoplasma sperm injection is a procedure that injects a single sperm into the egg to fertilize it.  Embryo transplant is a procedure that starts after growing an embryo in a special media (chemical solution) developed to keep the embryo alive for 2 to 5 days, and then transplanting it  into the uterus. In cases where a cause cannot be found and pregnancy does not occur, adoption may be a consideration. Document Released: 08/17/2003 Document Revised: 11/06/2011 Document Reviewed: 07/13/2009 [employer] Patient Information 2013 [employer].  

## 2012-07-09 NOTE — Progress Notes (Signed)
  Subjective:    Patient ID: [name], female    DOB: October 29, 1975, 36 y.o.   MRN: [id number]  HPI Here today for f/u.  2 issues 1.  Painful cycles, with h/o myomectomy--pelvic sonogram ordered and reviewed results with pt. Today 2.  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  Had difficulty conceiving first pregnancy, but achieved easily after myomectomy.  Prior C-section for myomectomy.  Same partner.  Has one focal 3.3 cm fibroid in left fundus.   Review of Systems  Constitutional: Negative for fever and chills.  Gastrointestinal: Negative for nausea, vomiting and abdominal pain.  Genitourinary: Positive for menstrual problem.       Objective:   Physical Exam  Vitals reviewed. Constitutional: She appears well-developed and well-nourished.  HENT:  Head: Normocephalic and atraumatic.  Neck: Neck supple.  Cardiovascular: Normal rate.   Pulmonary/Chest: Effort normal.  Abdominal: Soft. There is no tenderness.          Assessment & Plan:

## 2012-07-30 ENCOUNTER — Ambulatory Visit: Payer: BC Managed Care – PPO | Admitting: Family Medicine

## 2012-08-14 ENCOUNTER — Other Ambulatory Visit: Payer: Self-pay | Admitting: Family Medicine

## 2012-08-14 DIAGNOSIS — N979 Female infertility, unspecified: Secondary | ICD-10-CM

## 2012-08-15 ENCOUNTER — Other Ambulatory Visit (HOSPITAL_COMMUNITY): Payer: Self-pay | Admitting: Orthopedic Surgery

## 2012-08-19 ENCOUNTER — Ambulatory Visit (HOSPITAL_COMMUNITY)
Admission: RE | Admit: 2012-08-19 | Discharge: 2012-08-19 | Disposition: A | Discharge status: Home / Self Care | Payer: BC Managed Care – PPO | Source: Ambulatory Visit | Attending: Family Medicine | Admitting: Family Medicine

## 2012-08-19 DIAGNOSIS — N979 Female infertility, unspecified: Secondary | ICD-10-CM | POA: Insufficient documentation

## 2012-08-19 MED ORDER — IOHEXOL 300 MG/ML  SOLN
15.0000 mL | Freq: Once | INTRAMUSCULAR | Status: AC | PRN
  Administered 2012-08-19: 15 mL

## 2013-06-24 ENCOUNTER — Ambulatory Visit: Payer: BC Managed Care – PPO | Admitting: Family Medicine

## 2013-07-04 ENCOUNTER — Ambulatory Visit: Payer: BC Managed Care – PPO | Admitting: Obstetrics & Gynecology

## 2013-10-13 ENCOUNTER — Ambulatory Visit (INDEPENDENT_AMBULATORY_CARE_PROVIDER_SITE_OTHER): Payer: BC Managed Care – PPO | Admitting: Obstetrics & Gynecology

## 2013-10-13 ENCOUNTER — Encounter: Payer: Self-pay | Admitting: Obstetrics & Gynecology

## 2013-10-13 VITALS — BP 132/88 | HR 102 | Ht 69.0 in | Wt 211.0 lb

## 2013-10-13 DIAGNOSIS — Z124 Encounter for screening for malignant neoplasm of cervix: Secondary | ICD-10-CM

## 2013-10-13 DIAGNOSIS — N949 Unspecified condition associated with female genital organs and menstrual cycle: Secondary | ICD-10-CM

## 2013-10-13 DIAGNOSIS — Z1151 Encounter for screening for human papillomavirus (HPV): Secondary | ICD-10-CM

## 2013-10-13 DIAGNOSIS — N938 Other specified abnormal uterine and vaginal bleeding: Secondary | ICD-10-CM

## 2013-10-13 DIAGNOSIS — Z0000 Encounter for general adult medical examination without abnormal findings: Secondary | ICD-10-CM

## 2013-10-13 DIAGNOSIS — Z01419 Encounter for gynecological examination (general) (routine) without abnormal findings: Secondary | ICD-10-CM

## 2013-10-13 LAB — CBC
HEMATOCRIT: 37 % (ref 36.0–46.0)
HEMOGLOBIN: 12.2 g/dL (ref 12.0–15.0)
MCH: 24.2 pg — ABNORMAL LOW (ref 26.0–34.0)
MCHC: 33 g/dL (ref 30.0–36.0)
MCV: 73.4 fL — AB (ref 78.0–100.0)
Platelets: 324 10*3/uL (ref 150–400)
RBC: 5.04 MIL/uL (ref 3.87–5.11)
RDW: 16.6 % — AB (ref 11.5–15.5)
WBC: 5 10*3/uL (ref 4.0–10.5)

## 2013-10-13 NOTE — Progress Notes (Signed)
Subjective:    [name] is a 38 y.o. female who presents for an annual exam. The patient has no complaints today. She does mention that her cycles are"irregular", lasted 14-15 days in January and now 6 days this months. The patient is sexually active. GYN screening history: last pap: was normal. The patient wears seatbelts: yes. The patient participates in regular exercise: yes. Has the patient ever been transfused or tattooed?: no. The patient reports that there is not domestic violence in her life.   Menstrual History: OB History   Grav Para Term Preterm Abortions TAB SAB Ect Mult Living   1 1        1       Menarche age: 52  Patient's last menstrual period was 10/02/2013.    The following portions of the patient's history were reviewed and updated as appropriate: allergies, current medications, past family history, past medical history, past social history, past surgical history and problem list.  Review of Systems Pertinent items are noted in HPI. Married for 7 years. She declines a flu vaccine today.   Objective:    BP 132/88  Pulse 102  Ht 5' 9"  (1.753 m)  Wt 211 lb (95.709 kg)  BMI 31.15 kg/m2  LMP 10/02/2013  General Appearance:    Alert, cooperative, no distress, appears stated age  Head:    Normocephalic, without obvious abnormality, atraumatic  Eyes:    PERRL, conjunctiva/corneas clear, EOM's intact, fundi    benign, both eyes  Ears:    Normal TM's and external ear canals, both ears  Nose:   Nares normal, septum midline, mucosa normal, no drainage    or sinus tenderness  Throat:   Lips, mucosa, and tongue normal; teeth and gums normal  Neck:   Supple, symmetrical, trachea midline, no adenopathy;    thyroid:  no enlargement/tenderness/nodules; no carotid   bruit or JVD  Back:     Symmetric, no curvature, ROM normal, no CVA tenderness  Lungs:     Clear to auscultation bilaterally, respirations unlabored  Chest Wall:    No tenderness or deformity   Heart:     Regular rate and rhythm, S1 and S2 normal, no murmur, rub   or gallop  Breast Exam:    No tenderness, masses, or nipple abnormality  Abdomen:     Soft, non-tender, bowel sounds active all four quadrants,    no masses, no organomegaly  Genitalia:    Normal female without lesion, discharge or tenderness, 10 week size mobile and NT uterus, normal adnexal exam     Extremities:   Extremities normal, atraumatic, no cyanosis or edema  Pulses:   2+ and symmetric all extremities  Skin:   Skin color, texture, turgor normal, no rashes or lesions  Lymph nodes:   Cervical, supraclavicular, and axillary nodes normal  Neurologic:   CNII-XII intact, normal strength, sensation and reflexes    throughout  .    Assessment:    Healthy female exam.  DUB    Plan:     Breast self exam technique reviewed and patient encouraged to perform self-exam monthly. Thin prep Pap smear.  with cotesting Check TSH and CBC

## 2013-10-13 NOTE — Progress Notes (Signed)
Patient is having irregular cycles.

## 2013-10-14 LAB — TSH: TSH: 1.419 u[IU]/mL (ref 0.350–4.500)

## 2014-02-13 ENCOUNTER — Encounter: Payer: Self-pay | Admitting: Family Medicine

## 2014-02-13 ENCOUNTER — Ambulatory Visit (INDEPENDENT_AMBULATORY_CARE_PROVIDER_SITE_OTHER): Payer: BC Managed Care – PPO | Admitting: Family Medicine

## 2014-02-13 VITALS — BP 120/80 | HR 90 | Temp 98.0°F | Wt 215.0 lb

## 2014-02-13 DIAGNOSIS — A499 Bacterial infection, unspecified: Secondary | ICD-10-CM

## 2014-02-13 DIAGNOSIS — J208 Acute bronchitis due to other specified organisms: Principal | ICD-10-CM

## 2014-02-13 DIAGNOSIS — B9689 Other specified bacterial agents as the cause of diseases classified elsewhere: Secondary | ICD-10-CM

## 2014-02-13 DIAGNOSIS — J209 Acute bronchitis, unspecified: Secondary | ICD-10-CM

## 2014-02-13 MED ORDER — AZITHROMYCIN 250 MG PO TABS: ORAL_TABLET | ORAL | Status: DC

## 2014-02-13 MED ORDER — GUAIFENESIN-CODEINE 100-10 MG/5ML PO SYRP: 5.0000 mL | ORAL_SOLUTION | Freq: Every evening | ORAL | Status: DC | PRN

## 2014-02-13 NOTE — Progress Notes (Signed)
   Subjective:    Patient ID: [name], female    DOB: 03/31/1976, 38 y.o.   MRN: [id number]  Cough This is a new problem. The current episode started 1 to 4 weeks ago. The problem has been gradually worsening. The problem occurs constantly. The cough is productive of sputum (keeping up at night). Associated symptoms include ear congestion, nasal congestion, postnasal drip, rhinorrhea and a sore throat. Pertinent negatives include no chills, ear pain, fever, rash, shortness of breath or wheezing. Associated symptoms comments: sneezing. The symptoms are aggravated by lying down. Risk factors: daughter sick contact. She has tried OTC cough suppressant (mucinex, dayquil) for the symptoms. The treatment provided mild relief. There is no history of asthma, bronchiectasis, bronchitis, COPD, emphysema, environmental allergies or pneumonia.      Review of Systems  Constitutional: Negative for fever and chills.  HENT: Positive for postnasal drip, rhinorrhea and sore throat. Negative for ear pain.   Respiratory: Positive for cough. Negative for shortness of breath and wheezing.   Skin: Negative for rash.  Allergic/Immunologic: Negative for environmental allergies.       Objective:   Physical Exam        Assessment & Plan:

## 2014-02-13 NOTE — Progress Notes (Signed)
Pre visit review using our clinic review tool, if applicable. No additional management support is needed unless otherwise documented below in the visit note. 

## 2014-02-13 NOTE — Patient Instructions (Signed)
Rest, fluids.  Cough suppressant at night.  Continue Mucinex DM during the day.  Complete antibiotics Call if you are not feeling better in next 4-5 days, go To ER if severe shortness of breath.

## 2014-02-13 NOTE — Assessment & Plan Note (Signed)
>   2 weeks.  Start and complete antibiotics.

## 2014-06-29 ENCOUNTER — Encounter: Payer: Self-pay | Admitting: Family Medicine

## 2014-06-30 ENCOUNTER — Ambulatory Visit (INDEPENDENT_AMBULATORY_CARE_PROVIDER_SITE_OTHER): Payer: BC Managed Care – PPO | Admitting: Internal Medicine

## 2014-06-30 ENCOUNTER — Encounter: Payer: Self-pay | Admitting: Internal Medicine

## 2014-06-30 VITALS — BP 118/82 | HR 91 | Temp 98.2°F | Wt 225.0 lb

## 2014-06-30 DIAGNOSIS — B9789 Other viral agents as the cause of diseases classified elsewhere: Principal | ICD-10-CM

## 2014-06-30 DIAGNOSIS — J069 Acute upper respiratory infection, unspecified: Secondary | ICD-10-CM

## 2014-06-30 MED ORDER — HYDROCODONE-HOMATROPINE 5-1.5 MG/5ML PO SYRP: 5.0000 mL | ORAL_SOLUTION | Freq: Three times a day (TID) | ORAL | Status: DC | PRN

## 2014-06-30 NOTE — Progress Notes (Signed)
HPI  Pt presents to the clinic today with c/o sore throat, headache, and cough. She reports this started 3 days ago. The cough is unproductive. It is keeping her up at night. She denies fever but has had chills and body aches. She has had 1 episode of loose stool but not sure if the is related. She denies nausea or vomiting. She has tried Mucinex without much relief. She has no history of allergies or breathing problems. She has had sick contacts.  Review of Systems      Past Medical History  Diagnosis Date  . DVT (deep venous thrombosis)   . Fibroids   . Breast cyst     BILATERAL  . Obesity     Family History  Problem Relation Age of Onset  . Diabetes Paternal Grandmother   . Heart disease Neg Hx   . Hypertension Neg Hx   . Cancer Neg Hx     breast or colon cancer    History   Social History  . Marital Status: Married    Spouse Name: N/A    Number of Children: 1  . Years of Education: N/A   Occupational History  . 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 Smoking status: Never Smoker   . Smokeless tobacco: Never Used  . Alcohol Use: Yes     Comment: rare  . Drug Use: No  . Sexual Activity:    Partners: Male    Birth Control/ Protection: None   Other Topics Concern  . Not on file   Social History Narrative    No Known Allergies   Constitutional: Positive headache, fatigueDenies fever or abrupt weight changes.  HEENT:  Positive sore throat. Denies eye redness, eye pain, pressure behind the eyes, facial pain, nasal congestion, ear pain, ringing in the ears, wax buildup, runny nose or bloody nose. Respiratory: Positive cough. Denies difficulty breathing or shortness of breath.  Cardiovascular: Denies chest pain, chest tightness, palpitations or swelling in the hands or feet.   No other specific complaints in a complete review of systems (except as listed in HPI above).  Objective:   BP 118/82 mmHg  Pulse 91  Temp(Src) 98.2  F (36.8 C) (Oral)  Wt 225 lb (102.059 kg)  SpO2 98% Wt Readings from Last 3 Encounters:  06/30/14 225 lb (102.059 kg)  02/13/14 215 lb (97.523 kg)  10/13/13 211 lb (95.709 kg)     General: Appears her stated age, well developed, well nourished in NAD. HEENT: Head: normal shape and size;  Ears: Tm's red and intact, normal light reflex, + effusion on the right; Nose: mucosa pink and moist, septum midline; Throat/Mouth: . Teeth present, mucosa erythematous and moist, no exudate noted, no lesions or ulcerations noted.  Cardiovascular: Normal rate and rhythm. S1,S2 noted.  No murmur, rubs or gallops noted.  Pulmonary/Chest: Normal effort and positive vesicular breath sounds. No respiratory distress. No wheezes, rales or ronchi noted.      Assessment & Plan:   Viral Upper Respiratory Infection with Cough:  Get some rest and drink plenty of water Do salt water gargles for the sore throat Try ibuprofen for the body aches eRx for Hycodan cough syrup  RTC as needed or if symptoms persist.

## 2014-06-30 NOTE — Progress Notes (Signed)
Pre visit review using our clinic review tool, if applicable. No additional management support is needed unless otherwise documented below in the visit note. 

## 2014-06-30 NOTE — Patient Instructions (Signed)
Upper Respiratory Infection, Adult An upper respiratory infection (URI) is also sometimes known as the common cold. The upper respiratory tract includes the nose, sinuses, throat, trachea, and bronchi. Bronchi are the airways leading to the lungs. Most people improve within 1 week, but symptoms can last up to 2 weeks. A residual cough may last even longer.  CAUSES Many different viruses can infect the tissues lining the upper respiratory tract. The tissues become irritated and inflamed and often become very moist. Mucus production is also common. A cold is contagious. You can easily spread the virus to others by oral contact. This includes kissing, sharing a glass, coughing, or sneezing. Touching your mouth or nose and then touching a surface, which is then touched by another person, can also spread the virus. SYMPTOMS  Symptoms typically develop 1 to 3 days after you come in contact with a cold virus. Symptoms vary from person to person. They may include:  Runny nose.  Sneezing.  Nasal congestion.  Sinus irritation.  Sore throat.  Loss of voice (laryngitis).  Cough.  Fatigue.  Muscle aches.  Loss of appetite.  Headache.  Low-grade fever. DIAGNOSIS  You might diagnose your own cold based on familiar symptoms, since most people get a cold 2 to 3 times a year. Your caregiver can confirm this based on your exam. Most importantly, your caregiver can check that your symptoms are not due to another disease such as strep throat, sinusitis, pneumonia, asthma, or epiglottitis. Blood tests, throat tests, and X-rays are not necessary to diagnose a common cold, but they may sometimes be helpful in excluding other more serious diseases. Your caregiver will decide if any further tests are required. RISKS AND COMPLICATIONS  You may be at risk for a more severe case of the common cold if you smoke cigarettes, have chronic heart disease (such as heart failure) or lung disease (such as asthma), or if  you have a weakened immune system. The very young and very old are also at risk for more serious infections. Bacterial sinusitis, middle ear infections, and bacterial pneumonia can complicate the common cold. The common cold can worsen asthma and chronic obstructive pulmonary disease (COPD). Sometimes, these complications can require emergency medical care and may be life-threatening. PREVENTION  The best way to protect against getting a cold is to practice good hygiene. Avoid oral or hand contact with people with cold symptoms. Wash your hands often if contact occurs. There is no clear evidence that vitamin C, vitamin E, echinacea, or exercise reduces the chance of developing a cold. However, it is always recommended to get plenty of rest and practice good nutrition. TREATMENT  Treatment is directed at relieving symptoms. There is no cure. Antibiotics are not effective, because the infection is caused by a virus, not by bacteria. Treatment may include:  Increased fluid intake. Sports drinks offer valuable electrolytes, sugars, and fluids.  Breathing heated mist or steam (vaporizer or shower).  Eating chicken soup or other clear broths, and maintaining good nutrition.  Getting plenty of rest.  Using gargles or lozenges for comfort.  Controlling fevers with ibuprofen or acetaminophen as directed by your caregiver.  Increasing usage of your inhaler if you have asthma. Zinc gel and zinc lozenges, taken in the first 24 hours of the common cold, can shorten the duration and lessen the severity of symptoms. Pain medicines may help with fever, muscle aches, and throat pain. A variety of non-prescription medicines are available to treat congestion and runny nose. Your caregiver   can make recommendations and may suggest nasal or lung inhalers for other symptoms.  HOME CARE INSTRUCTIONS   Only take over-the-counter or prescription medicines for pain, discomfort, or fever as directed by your  caregiver.  Use a warm mist humidifier or inhale steam from a shower to increase air moisture. This may keep secretions moist and make it easier to breathe.  Drink enough water and fluids to keep your urine clear or pale yellow.  Rest as needed.  Return to work when your temperature has returned to normal or as your caregiver advises. You may need to stay home longer to avoid infecting others. You can also use a face mask and careful hand washing to prevent spread of the virus. SEEK MEDICAL CARE IF:   After the first few days, you feel you are getting worse rather than better.  You need your caregiver's advice about medicines to control symptoms.  You develop chills, worsening shortness of breath, or brown or red sputum. These may be signs of pneumonia.  You develop yellow or brown nasal discharge or pain in the face, especially when you bend forward. These may be signs of sinusitis.  You develop a fever, swollen neck glands, pain with swallowing, or white areas in the back of your throat. These may be signs of strep throat. SEEK IMMEDIATE MEDICAL CARE IF:   You have a fever.  You develop severe or persistent headache, ear pain, sinus pain, or chest pain.  You develop wheezing, a prolonged cough, cough up blood, or have a change in your usual mucus (if you have chronic lung disease).  You develop sore muscles or a stiff neck. Document Released: 02/07/2001 Document Revised: 11/06/2011 Document Reviewed: 11/19/2013 [employer] Patient Information 2015 [employer]. This information is not intended to replace advice given to you by your health care provider. Make sure you discuss any questions you have with your health care provider.  

## 2014-07-08 ENCOUNTER — Ambulatory Visit (INDEPENDENT_AMBULATORY_CARE_PROVIDER_SITE_OTHER): Payer: BC Managed Care – PPO | Admitting: Advanced Practice Midwife

## 2014-07-08 ENCOUNTER — Encounter: Payer: Self-pay | Admitting: Advanced Practice Midwife

## 2014-07-08 VITALS — BP 129/95 | HR 85 | Wt 227.0 lb

## 2014-07-08 DIAGNOSIS — N938 Other specified abnormal uterine and vaginal bleeding: Secondary | ICD-10-CM

## 2014-07-08 DIAGNOSIS — N979 Female infertility, unspecified: Secondary | ICD-10-CM

## 2014-07-08 NOTE — Progress Notes (Signed)
Subjective:     Patient ID: [name], female   DOB: [birth year], 38 y.o.   MRN: [id number]  HPI 38 y.o. G1P1 presents to office with irregular periods, lasting 2 weeks each time, with spotting in between.  She also has desired fertility, and is interested in talking about ways to regulate her cycle.  She was seen for her annual exam in February and had normal TSH and CBC.  She denies vaginal itching/burning, urinary symptoms, h/a, dizziness, n/v, or fever/chills.    Review of Systems Review of Systems - General ROS: negative    Objective:   Physical Exam Physical Examination: General appearance - alert, well appearing, and in no distress, oriented to person, place, and time and acyanotic, in no respiratory distress  While discussing endometrial biopsy with pt, fire alarm went off in office. Pt decided to leave, and reschedule visit for endometrial biopsy on same day as ultrasound at [facility].      Assessment:     1. DUB (dysfunctional uterine bleeding)   2. Infertility, female        Plan:     Reschedule office visit with provider for endometrial biopsy Pelvic U/S ordered at [facility] r/t DUB Follow up in office with results Discuss fertility treatment options at follow up visit

## 2014-07-22 ENCOUNTER — Ambulatory Visit (HOSPITAL_COMMUNITY)
Admission: RE | Admit: 2014-07-22 | Discharge: 2014-07-22 | Disposition: A | Discharge status: Home / Self Care | Payer: BC Managed Care – PPO | Source: Ambulatory Visit | Attending: Advanced Practice Midwife | Admitting: Advanced Practice Midwife

## 2014-07-22 ENCOUNTER — Encounter: Payer: Self-pay | Admitting: Family Medicine

## 2014-07-22 ENCOUNTER — Ambulatory Visit (INDEPENDENT_AMBULATORY_CARE_PROVIDER_SITE_OTHER): Payer: BC Managed Care – PPO | Admitting: Family Medicine

## 2014-07-22 ENCOUNTER — Other Ambulatory Visit: Payer: Self-pay | Admitting: Advanced Practice Midwife

## 2014-07-22 VITALS — BP 116/85 | HR 74 | Ht 71.0 in | Wt 222.6 lb

## 2014-07-22 DIAGNOSIS — N938 Other specified abnormal uterine and vaginal bleeding: Secondary | ICD-10-CM

## 2014-07-22 DIAGNOSIS — D251 Intramural leiomyoma of uterus: Secondary | ICD-10-CM | POA: Diagnosis not present

## 2014-07-22 DIAGNOSIS — Z01812 Encounter for preprocedural laboratory examination: Secondary | ICD-10-CM

## 2014-07-22 LAB — POCT URINE PREGNANCY: PREG TEST UR: NEGATIVE

## 2014-07-22 NOTE — Progress Notes (Signed)
ENDOMETRIAL BIOPSY     The indications for endometrial biopsy were reviewed.   Risks of the biopsy including cramping, bleeding, infection, uterine perforation, inadequate specimen and need for additional procedures  were discussed. The patient states she understands and agrees to undergo procedure today. Consent was signed. Time out was performed. Urine HCG was negative. A sterile speculum was placed in the patient's vagina and the cervix was prepped with Betadine. A single-toothed tenaculum was placed on the anterior lip of the cervix to stabilize it. The 3 mm pipelle was introduced into the endometrial cavity without difficulty to a depth of 10 cm, and a moderate amount of tissue was obtained and sent to pathology. The instruments were removed from the patient's vagina. Minimal bleeding from the cervix was noted. The patient tolerated the procedure well. Routine post-procedure instructions were given to the patient. The patient will follow up to review the results and for further management.

## 2014-08-18 ENCOUNTER — Ambulatory Visit (INDEPENDENT_AMBULATORY_CARE_PROVIDER_SITE_OTHER): Payer: BC Managed Care – PPO | Admitting: Family Medicine

## 2014-08-18 ENCOUNTER — Encounter: Payer: Self-pay | Admitting: Family Medicine

## 2014-08-18 VITALS — BP 139/94 | HR 87 | Ht 69.0 in | Wt 224.4 lb

## 2014-08-18 DIAGNOSIS — N979 Female infertility, unspecified: Secondary | ICD-10-CM | POA: Diagnosis not present

## 2014-08-18 DIAGNOSIS — N938 Other specified abnormal uterine and vaginal bleeding: Secondary | ICD-10-CM | POA: Diagnosis not present

## 2014-08-18 MED ORDER — MEGESTROL ACETATE 40 MG PO TABS: 40.0000 mg | ORAL_TABLET | Freq: Two times a day (BID) | ORAL | Status: DC

## 2014-08-18 NOTE — Patient Instructions (Addendum)
Spero Curb, M.D.  Endocrinologist  Address: [address], [place], [place] [address]  [phone]   Uterine Fibroid A uterine fibroid is a growth (tumor) that occurs in your uterus. This type of tumor is not cancerous and does not spread out of the uterus. You can have one or many fibroids. Fibroids can vary in size, weight, and where they grow in the uterus. Some can become quite large. Most fibroids do not require medical treatment, but some can cause pain or heavy bleeding during and between periods. CAUSES  A fibroid is the result of a single uterine cell that keeps growing (unregulated), which is different than most cells in the human body. Most cells have a control mechanism that keeps them from reproducing without control.  SIGNS AND SYMPTOMS   Bleeding.  Pelvic pain and pressure.  Bladder problems due to the size of the fibroid.  Infertility and miscarriages depending on the size and location of the fibroid. DIAGNOSIS  Uterine fibroids are diagnosed through a physical exam. Your health care provider may feel the lumpy tumors during a pelvic exam. Ultrasonography may be done to get information regarding size, location, and number of tumors.  TREATMENT   Your health care provider may recommend watchful waiting. This involves getting the fibroid checked by your health care provider to see if it grows or shrinks.   Hormone treatment or an intrauterine device (IUD) may be prescribed.   Surgery may be needed to remove the fibroids (myomectomy) or the uterus (hysterectomy). This depends on your situation. When fibroids interfere with fertility and a woman wants to become pregnant, a health care provider may recommend having the fibroids removed.  [place] care depends on how you were treated. In general:   Keep all follow-up appointments with your health care provider.   Only take over-the-counter or prescription medicines as directed by  your health care provider. If you were prescribed a hormone treatment, take the hormone medicines exactly as directed. Do not take aspirin. It can cause bleeding.   Talk to your health care provider about taking iron pills.  If your periods are troublesome but not so heavy, lie down with your feet raised slightly above your heart. Place cold packs on your lower abdomen.   If your periods are heavy, write down the number of pads or tampons you use per month. Bring this information to your health care provider.   Include green vegetables in your diet.  SEEK IMMEDIATE MEDICAL CARE IF:  You have pelvic pain or cramps not controlled with medicines.   You have a sudden increase in pelvic pain.   You have an increase in bleeding between and during periods.   You have excessive periods and soak tampons or pads in a half hour or less.  You feel lightheaded or have fainting episodes. Document Released: 08/11/2000 Document Revised: 06/04/2013 Document Reviewed: 03/13/2013 [facility] Patient Information 2015 [place], [place]. This information is not intended to replace advice given to you by your health care provider. Make sure you discuss any questions you have with your health care provider. Menorrhagia Menorrhagia is the medical term for when your menstrual periods are heavy or last longer than usual. With menorrhagia, every period you have may cause enough blood loss and cramping that you are unable to maintain your usual activities. CAUSES  In some cases, the cause of heavy periods is unknown, but a number of conditions may cause menorrhagia. Common causes include:  A problem with  the hormone-producing thyroid gland (hypothyroid).  Noncancerous growths in the uterus (polyps or fibroids).  An imbalance of the estrogen and progesterone hormones.  One of your ovaries not releasing an egg during one or more months.  Side effects of having an intrauterine device (IUD).  Side effects of  some medicines, such as anti-inflammatory medicines or blood thinners.  A bleeding disorder that stops your blood from clotting normally. SIGNS AND SYMPTOMS  During a normal period, bleeding lasts between 4 and 8 days. Signs that your periods are too heavy include:  You routinely have to change your pad or tampon every 1 or 2 hours because it is completely soaked.  You pass blood clots larger than 1 inch (2.5 cm) in size.  You have bleeding for more than 7 days.  You need to use pads and tampons at the same time because of heavy bleeding.  You need to wake up to change your pads or tampons during the night.  You have symptoms of anemia, such as tiredness, fatigue, or shortness of breath. DIAGNOSIS  Your health care provider will perform a physical exam and ask you questions about your symptoms and menstrual history. Other tests may be ordered based on what the health care provider finds during the exam. These tests can include:  Blood tests. Blood tests are used to check if you are pregnant or have hormonal changes, a bleeding or thyroid disorder, low iron levels (anemia), or other problems.  Endometrial biopsy. Your health care provider takes a sample of tissue from the inside of your uterus to be examined under a microscope.  Pelvic ultrasound. This test uses sound waves to make a picture of your uterus, ovaries, and vagina. The pictures can show if you have fibroids or other growths.  Hysteroscopy. For this test, your health care provider will use a small telescope to look inside your uterus. Based on the results of your initial tests, your health care provider may recommend further testing. TREATMENT  Treatment may not be needed. If it is needed, your health care provider may recommend treatment with one or more medicines first. If these do not reduce bleeding enough, a surgical treatment might be an option. The best treatment for you will depend on:   Whether you need to prevent  pregnancy.  Your desire to have children in the future.  The cause and severity of your bleeding.  Your opinion and personal preference.  Medicines for menorrhagia may include:  Birth control methods that use hormones. These include the pill, skin patch, vaginal ring, shots that you get every 3 months, hormonal IUD, and implant. These treatments reduce bleeding during your menstrual period.  Medicines that thicken blood and slow bleeding.  Medicines that reduce swelling, such as ibuprofen.  Medicines that contain a synthetic hormone called progestin.   Medicines that make the ovaries stop working for a short time.  You may need surgical treatment for menorrhagia if the medicines are unsuccessful. Treatment options include:  Dilation and curettage (D&C). In this procedure, your health care provider opens (dilates) your cervix and then scrapes or suctions tissue from the lining of your uterus to reduce menstrual bleeding.  Operative hysteroscopy. This procedure uses a tiny tube with a light (hysteroscope) to view your uterine cavity and can help in the surgical removal of a polyp that may be causing heavy periods.  Endometrial ablation. Through various techniques, your health care provider permanently destroys the entire lining of your uterus (endometrium). After endometrial ablation, most  women have little or no menstrual flow. Endometrial ablation reduces your ability to become pregnant.  Endometrial resection. This surgical procedure uses an electrosurgical wire loop to remove the lining of the uterus. This procedure also reduces your ability to become pregnant.  Hysterectomy. Surgical removal of the uterus and cervix is a permanent procedure that stops menstrual periods. Pregnancy is not possible after a hysterectomy. This procedure requires anesthesia and hospitalization. HOME CARE INSTRUCTIONS   Only take over-the-counter or prescription medicines as directed by your health  care provider. Take prescribed medicines exactly as directed. Do not change or switch medicines without consulting your health care provider.  Take any prescribed iron pills exactly as directed by your health care provider. Long-term heavy bleeding may result in low iron levels. Iron pills help replace the iron your body lost from heavy bleeding. Iron may cause constipation. If this becomes a problem, increase the bran, fruits, and roughage in your diet.  Do not take aspirin or medicines that contain aspirin 1 week before or during your menstrual period. Aspirin may make the bleeding worse.  If you need to change your sanitary pad or tampon more than once every 2 hours, stay in bed and rest as much as possible until the bleeding stops.  Eat well-balanced meals. Eat foods high in iron. Examples are leafy green vegetables, meat, liver, eggs, and whole grain breads and cereals. Do not try to lose weight until the abnormal bleeding has stopped and your blood iron level is back to normal. SEEK MEDICAL CARE IF:   You soak through a pad or tampon every 1 or 2 hours, and this happens every time you have a period.  You need to use pads and tampons at the same time because you are bleeding so much.  You need to change your pad or tampon during the night.  You have a period that lasts for more than 8 days.  You pass clots bigger than 1 inch wide.  You have irregular periods that happen more or less often than once a month.  You feel dizzy or faint.  You feel very weak or tired.  You feel short of breath or feel your heart is beating too fast when you exercise.  You have nausea and vomiting or diarrhea while you are taking your medicine.  You have any problems that may be related to the medicine you are taking. SEEK IMMEDIATE MEDICAL CARE IF:   You soak through 4 or more pads or tampons in 2 hours.  You have any bleeding while you are pregnant. MAKE SURE YOU:   Understand these  instructions.  Will watch your condition.  Will get help right away if you are not doing well or get worse. Document Released: 08/14/2005 Document Revised: 08/19/2013 Document Reviewed: 02/02/2013 [facility] Patient Information 2015 [place], [place]. This information is not intended to replace advice given to you by your health care provider. Make sure you discuss any questions you have with your health care provider.

## 2014-08-18 NOTE — Assessment & Plan Note (Signed)
Options reviewed.  Including treatment with myomectomy, hysterectomy, IUD insertion, endometrial ablation, po progesterone [place]. Fertility will drive treatment.  At this point she desires only po progesterone-->to take with cycles to shorten them and make them shorter. She will continue to consider desires around fertility.

## 2014-08-18 NOTE — Assessment & Plan Note (Signed)
Remains unclear about possible further w/u and treatment.  Advised that age is a factor.  Given number for Dr. Kerin Perna, for possible w/u, robotic myomectomy, treatment of fertility.  Discussed surrogacy option, adoption.  Pt. Would need anti-coagulation, and repeat C-section, due to h/o DVT and myomectomy if achieves pregnancy.

## 2014-08-18 NOTE — Progress Notes (Signed)
    Subjective:    Patient ID: [name] is a 38 y.o. female presenting with Follow-up  on 08/18/2014  HPI: Reports that she is having monthly cycles, but they are longer than normal and heavier. U/s shows fibroids have returned, has 2 now, largest is 4 cm. Had negative EMB.  Normal thyroid in 2/15.  Has h/o DVT on OC's. Pt. Has had trouble conceiving since delivery of her last child.  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  Normal left tube.  Never had semen analysis.  Normal FSH, [facility] 2 years ago.  Review of Systems  Constitutional: Negative for fever and chills.  Respiratory: Negative for shortness of breath.   Cardiovascular: Negative for chest pain.  Gastrointestinal: Negative for nausea, vomiting and abdominal pain.  Genitourinary: Positive for vaginal bleeding and menstrual problem. Negative for dysuria and vaginal pain.  Skin: Negative for rash.      Objective:    BP 139/94 mmHg  Pulse 87  Ht 5' 9"  (1.753 m)  Wt 224 lb 6.4 oz (101.787 kg)  BMI 33.12 kg/m2  LMP 07/21/2014 (Exact Date) Physical Exam  Constitutional: She is oriented to person, place, and time. She appears well-developed and well-nourished. No distress.  HENT:  Head: Normocephalic and atraumatic.  Eyes: No scleral icterus.  Neck: Neck supple.  Cardiovascular: Normal rate.   Pulmonary/Chest: Effort normal.  Abdominal: Soft.  Neurological: She is alert and oriented to person, place, and time.  Skin: Skin is warm and dry.  Psychiatric: She has a normal mood and affect.        Assessment & Plan:   Problem List Items Addressed This Visit      Unprioritized   DUB (dysfunctional uterine bleeding) - Primary    Options reviewed.  Including treatment with myomectomy, hysterectomy, IUD insertion, endometrial ablation, po progesterone [place]. Fertility will drive treatment.  At this point she desires only po progesterone-->to take with cycles to shorten them and make them shorter. She will  continue to consider desires around fertility.    Relevant Medications      megestrol (MEGACE) tablet   Infertility, female    Remains unclear about possible further w/u and treatment.  Advised that age is a factor.  Given number for Dr. Kerin Perna, for possible w/u, robotic myomectomy, treatment of fertility.  Discussed surrogacy option, adoption.  Pt. Would need anti-coagulation, and repeat C-section, due to h/o DVT and myomectomy if achieves pregnancy.        Return in about 3 months (around 11/17/2014).

## 2014-09-24 ENCOUNTER — Ambulatory Visit (INDEPENDENT_AMBULATORY_CARE_PROVIDER_SITE_OTHER): Payer: BC Managed Care – PPO | Admitting: Family Medicine

## 2014-09-24 ENCOUNTER — Encounter: Payer: Self-pay | Admitting: Family Medicine

## 2014-09-24 VITALS — BP 123/95 | HR 110 | Ht 69.0 in | Wt 221.4 lb

## 2014-09-24 DIAGNOSIS — Z8672 Personal history of thrombophlebitis: Secondary | ICD-10-CM

## 2014-09-24 DIAGNOSIS — N979 Female infertility, unspecified: Secondary | ICD-10-CM

## 2014-09-24 DIAGNOSIS — N938 Other specified abnormal uterine and vaginal bleeding: Secondary | ICD-10-CM | POA: Diagnosis not present

## 2014-09-24 LAB — CBC
HCT: 24.4 % — ABNORMAL LOW (ref 36.0–46.0)
Hemoglobin: 7.6 g/dL — ABNORMAL LOW (ref 12.0–15.0)
MCH: 20.3 pg — ABNORMAL LOW (ref 26.0–34.0)
MCHC: 31.1 g/dL (ref 30.0–36.0)
MCV: 65.2 fL — AB (ref 78.0–100.0)
MPV: 9 fL (ref 8.6–12.4)
Platelets: 248 10*3/uL (ref 150–400)
RBC: 3.74 MIL/uL — AB (ref 3.87–5.11)
RDW: 16.7 % — ABNORMAL HIGH (ref 11.5–15.5)
WBC: 6.4 10*3/uL (ref 4.0–10.5)

## 2014-09-24 MED ORDER — MEGESTROL ACETATE 40 MG PO TABS: 80.0000 mg | ORAL_TABLET | Freq: Three times a day (TID) | ORAL | Status: DC

## 2014-09-24 NOTE — Progress Notes (Signed)
    Subjective:    Patient ID: [name] is a 39 y.o. female presenting with Follow-up  on 09/24/2014  HPI: Here for f/u.  Heavy cycles.  S/p EMB--wnl, u/s shows return of fibroids, 2 5 and 3.5 cm. Using megace but bleeding continues--she is still trying to decide about fertility. Turns 39 in 3 months.  Review of Systems  Constitutional: Negative for fever and chills.  Respiratory: Negative for shortness of breath.   Cardiovascular: Negative for chest pain.  Gastrointestinal: Negative for nausea, vomiting and abdominal pain.  Genitourinary: Negative for dysuria.  Skin: Negative for rash.      Objective:    BP 123/95 mmHg  Pulse 110  Ht 5' 9"  (1.753 m)  Wt 221 lb 6.4 oz (100.426 kg)  BMI 32.68 kg/m2  LMP  Physical Exam  Constitutional: She is oriented to person, place, and time. She appears well-developed and well-nourished. No distress.  HENT:  Head: Normocephalic and atraumatic.  Eyes: No scleral icterus.  Neck: Neck supple.  Cardiovascular: Normal rate.   Pulmonary/Chest: Effort normal.  Abdominal: Soft.  Neurological: She is alert and oriented to person, place, and time.  Skin: Skin is warm and dry.  Psychiatric: She has a normal mood and affect.        Assessment & Plan:   Problem List Items Addressed This Visit      Medium   DEEP VENOUS THROMBOPHLEBITIS, HX OF   DUB (dysfunctional uterine bleeding) - Primary    Likely related to fibroids--will increase megace and await infertility w/u.  Discussed myomectomy, hysterectomy, continued po progesterone, IUD, [place].      Relevant Medications   megestrol (MEGACE) tablet   Other Relevant Orders   CBC   TSH   Follicle stimulating hormone   Infertility, female    Given age and issues--think she should start with [place] and see about fertility--then can make a better decision around treatment of fibroids and bleeding.      Relevant Orders   Ambulatory referral to Infertility      Return in  about 3 months (around 12/24/2014).

## 2014-09-24 NOTE — Assessment & Plan Note (Signed)
Likely related to fibroids--will increase megace and await infertility w/u.  Discussed myomectomy, hysterectomy, continued po progesterone, IUD, [place].

## 2014-09-24 NOTE — Assessment & Plan Note (Signed)
Given age and issues--think she should start with [place] and see about fertility--then can make a better decision around treatment of fibroids and bleeding.

## 2014-09-24 NOTE — Patient Instructions (Signed)
Menorrhagia Menorrhagia is the medical term for when your menstrual periods are heavy or last longer than usual. With menorrhagia, every period you have may cause enough blood loss and cramping that you are unable to maintain your usual activities. CAUSES  In some cases, the cause of heavy periods is unknown, but a number of conditions may cause menorrhagia. Common causes include:  A problem with the hormone-producing thyroid gland (hypothyroid).  Noncancerous growths in the uterus (polyps or fibroids).  An imbalance of the estrogen and progesterone hormones.  One of your ovaries not releasing an egg during one or more months.  Side effects of having an intrauterine device (IUD).  Side effects of some medicines, such as anti-inflammatory medicines or blood thinners.  A bleeding disorder that stops your blood from clotting normally. SIGNS AND SYMPTOMS  During a normal period, bleeding lasts between 4 and 8 days. Signs that your periods are too heavy include:  You routinely have to change your pad or tampon every 1 or 2 hours because it is completely soaked.  You pass blood clots larger than 1 inch (2.5 cm) in size.  You have bleeding for more than 7 days.  You need to use pads and tampons at the same time because of heavy bleeding.  You need to wake up to change your pads or tampons during the night.  You have symptoms of anemia, such as tiredness, fatigue, or shortness of breath. DIAGNOSIS  Your health care provider will perform a physical exam and ask you questions about your symptoms and menstrual history. Other tests may be ordered based on what the health care provider finds during the exam. These tests can include:  Blood tests. Blood tests are used to check if you are pregnant or have hormonal changes, a bleeding or thyroid disorder, low iron levels (anemia), or other problems.  Endometrial biopsy. Your health care provider takes a sample of tissue from the inside of your  uterus to be examined under a microscope.  Pelvic ultrasound. This test uses sound waves to make a picture of your uterus, ovaries, and vagina. The pictures can show if you have fibroids or other growths.  Hysteroscopy. For this test, your health care provider will use a small telescope to look inside your uterus. Based on the results of your initial tests, your health care provider may recommend further testing. TREATMENT  Treatment may not be needed. If it is needed, your health care provider may recommend treatment with one or more medicines first. If these do not reduce bleeding enough, a surgical treatment might be an option. The best treatment for you will depend on:   Whether you need to prevent pregnancy.  Your desire to have children in the future.  The cause and severity of your bleeding.  Your opinion and personal preference.  Medicines for menorrhagia may include:  Birth control methods that use hormones. These include the pill, skin patch, vaginal ring, shots that you get every 3 months, hormonal IUD, and implant. These treatments reduce bleeding during your menstrual period.  Medicines that thicken blood and slow bleeding.  Medicines that reduce swelling, such as ibuprofen.  Medicines that contain a synthetic hormone called progestin.   Medicines that make the ovaries stop working for a short time.  You may need surgical treatment for menorrhagia if the medicines are unsuccessful. Treatment options include:  Dilation and curettage (D&C). In this procedure, your health care provider opens (dilates) your cervix and then scrapes or suctions tissue from  the lining of your uterus to reduce menstrual bleeding.  Operative hysteroscopy. This procedure uses a tiny tube with a light (hysteroscope) to view your uterine cavity and can help in the surgical removal of a polyp that may be causing heavy periods.  Endometrial ablation. Through various techniques, your health care  provider permanently destroys the entire lining of your uterus (endometrium). After endometrial ablation, most women have little or no menstrual flow. Endometrial ablation reduces your ability to become pregnant.  Endometrial resection. This surgical procedure uses an electrosurgical wire loop to remove the lining of the uterus. This procedure also reduces your ability to become pregnant.  Hysterectomy. Surgical removal of the uterus and cervix is a permanent procedure that stops menstrual periods. Pregnancy is not possible after a hysterectomy. This procedure requires anesthesia and hospitalization. HOME CARE INSTRUCTIONS   Only take over-the-counter or prescription medicines as directed by your health care provider. Take prescribed medicines exactly as directed. Do not change or switch medicines without consulting your health care provider.  Take any prescribed iron pills exactly as directed by your health care provider. Long-term heavy bleeding may result in low iron levels. Iron pills help replace the iron your body lost from heavy bleeding. Iron may cause constipation. If this becomes a problem, increase the bran, fruits, and roughage in your diet.  Do not take aspirin or medicines that contain aspirin 1 week before or during your menstrual period. Aspirin may make the bleeding worse.  If you need to change your sanitary pad or tampon more than once every 2 hours, stay in bed and rest as much as possible until the bleeding stops.  Eat well-balanced meals. Eat foods high in iron. Examples are leafy green vegetables, meat, liver, eggs, and whole grain breads and cereals. Do not try to lose weight until the abnormal bleeding has stopped and your blood iron level is back to normal. SEEK MEDICAL CARE IF:   You soak through a pad or tampon every 1 or 2 hours, and this happens every time you have a period.  You need to use pads and tampons at the same time because you are bleeding so much.  You  need to change your pad or tampon during the night.  You have a period that lasts for more than 8 days.  You pass clots bigger than 1 inch wide.  You have irregular periods that happen more or less often than once a month.  You feel dizzy or faint.  You feel very weak or tired.  You feel short of breath or feel your heart is beating too fast when you exercise.  You have nausea and vomiting or diarrhea while you are taking your medicine.  You have any problems that may be related to the medicine you are taking. SEEK IMMEDIATE MEDICAL CARE IF:   You soak through 4 or more pads or tampons in 2 hours.  You have any bleeding while you are pregnant. MAKE SURE YOU:   Understand these instructions.  Will watch your condition.  Will get help right away if you are not doing well or get worse. Document Released: 08/14/2005 Document Revised: 08/19/2013 Document Reviewed: 02/02/2013 [facility] Patient Information 2015 [place], [place]. This information is not intended to replace advice given to you by your health care provider. Make sure you discuss any questions you have with your health care provider. Fibroids Fibroids are lumps (tumors) that can occur any place in a woman's body. These lumps are not cancerous. Fibroids vary  in size, weight, and where they grow. HOME CARE  Do not take aspirin.  Write down the number of pads or tampons you use during your period. Tell your doctor. This can help determine the best treatment for you. GET HELP RIGHT AWAY IF:  You have pain in your lower belly (abdomen) that is not helped with medicine.  You have cramps that are not helped with medicine.  You have more bleeding between or during your period.  You feel lightheaded or pass out (faint).  Your lower belly pain gets worse. MAKE SURE YOU:  Understand these instructions.  Will watch your condition.  Will get help right away if you are not doing well or get worse. Document Released:  09/16/2010 Document Revised: 11/06/2011 Document Reviewed: 09/16/2010 [facility] Patient Information 2015 [place], [place]. This information is not intended to replace advice given to you by your health care provider. Make sure you discuss any questions you have with your health care provider. Infertility WHAT IS INFERTILITY?  Infertility is usually defined as not being able to get pregnant after trying for one year of regular sexual intercourse without the use of contraceptives. Or not being able to carry a pregnancy to term and have a baby. The infertility rate in the [place] is around 10%. Pregnancy is the result of a chain of events. A woman must release an egg from one of her ovaries (ovulation). The egg must be fertilized by the female sperm. Then it travels through a fallopian tube into the uterus (womb), where it attaches to the wall of the uterus and grows. A man must have enough sperm, and the sperm must join with (fertilize) the egg along the way, at the proper time. The fertilized egg must then become attached to the inside of the uterus. While this may seem simple, many things can happen to prevent pregnancy from occurring.  WHOSE PROBLEM IS IT?  About 20% of infertility cases are due to problems with the man (female factors) and 65% are due to problems with the woman (female factors). Other cases are due to a combination of female and female factors or to unknown causes.  WHAT CAUSES INFERTILITY IN MEN?  Infertility in men is often caused by problems with making enough normal sperm or getting the sperm to reach the egg. Problems with sperm may exist from birth or develop later in life, due to illness or injury. Some men produce no sperm, or produce too few sperm (oligospermia). Other problems include:  Sexual dysfunction.  Hormonal or endocrine problems.  Age. Female fertility decreases with age, but not at as young an age as female fertility.  Infection.  Congenital problems. Birth  defect, such as absence of the tubes that carry the sperm (vas deferens).  Genetic/chromosomal problems.  Antisperm antibody problems.  Retrograde ejaculation (sperm go into the bladder).  Varicoceles, spematoceles, or tumors of the testicles.  Lifestyle can influence the number and quality of a man's sperm.  Alcohol and drugs can temporarily reduce sperm quality.  Environmental toxins, including pesticides and lead, may cause some cases of infertility in men. WHAT CAUSES INFERTILITY IN WOMEN?   Problems with ovulation account for most infertility in women. Without ovulation, eggs are not available to be fertilized.  Signs of problems with ovulation include irregular menstrual periods or no periods at all.  Simple lifestyle factors, including stress, diet, or athletic training, can affect a woman's hormonal balance.  Age. Fertility begins to decrease in women in the early 69s  and is worse after age 60.  Much less often, a hormonal imbalance from a serious medical problem, such as a pituitary gland tumor, thyroid or other chronic medical disease, can cause ovulation problems.  Pelvic infections.  Polycystic ovary syndrome (increase in female hormones, unable to ovulate).  Alcohol or illegal drugs.  Environmental toxins, radiation, pesticides, and certain chemicals.  Aging is an important factor in female infertility.  The ability of a woman's ovaries to produce eggs declines with age, especially after age 37. About one third of couples where the woman is over 26 will have problems with fertility.  By the time she reaches menopause when her monthly periods stop for good, a woman can no longer produce eggs or become pregnant.  Other problems can also lead to infertility in women. If the fallopian tubes are blocked at one or both ends, the egg cannot travel through the tubes into the uterus. Scar tissue (adhesions) in the pelvis may cause blocked tubes. This may result from pelvic  inflammatory disease, endometriosis, or surgery for an ectopic pregnancy (fertilized egg implanted outside the uterus) or any pelvic or abdominal surgery causing adhesions.  Fibroid tumors or polyps of the uterus.  Congenital (birth defect) abnormalities of the uterus.  Infection of the cervix (cervicitis).  Cervical stenosis (narrowing).  Abnormal cervical mucus.  Polycystic ovary syndrome.  Having sexual intercourse too often (every other day or 4 to 5 times a week).  Obesity.  Anorexia.  Poor nutrition.  Over exercising, with loss of body fat.  DES. Your mother received diethylstilbesterol hormone when pregnant with you. HOW IS INFERTILITY TESTED?  If you have been trying to have a baby without success, you may want to seek medical help. You should not wait for one year of trying before seeing a health care provider if:  You are over 35.  You have reason to believe that there may be a fertility problem. A medical evaluation may determine the reasons for a couple's infertility. Usually this process begins with:  Physical exams.  Medical histories of both partners.  Sexual histories of both partners. If there is no obvious problem, like improperly timed intercourse or absence of ovulation, tests may be needed.   For a man, testing usually begins with tests of his semen to look at:  The number of sperm.  The shape of sperm.  Movement of his sperm.  Taking a complete medical and surgical history.  Physical examination.  Check for infection of the female reproductive organs. Sometimes hormone tests are done.   For a woman, the first step in testing is to find out if she is ovulating each month. There are several ways to do this. For example, she can keep track of changes in her morning body temperature and in the texture of her cervical mucus. Another tool is a home ovulation test kit, which can be bought at drug or grocery stores.  Checks of ovulation can also be  done in the doctor's office, using blood tests for hormone levels or ultrasound tests of the ovaries. If the woman is ovulating, more tests will need to be done. Some common female tests include:  Hysterosalpingogram: An x-ray of the fallopian tubes and uterus after they are injected with dye. It shows if the tubes are open and shows the shape of the uterus.  Laparoscopy: An exam of the tubes and other female organs for disease. A lighted tube called a laparoscope is used to see inside the abdomen.  Endometrial  biopsy: Sample of uterus tissue taken on the first day of the menstrual period, to see if the tissue indicates you are ovulating.  Transvaginal ultrasound: Examines the female organs.  Hysteroscopy: Uses a lighted tube to examine the cervix and inside the uterus, to see if there are any abnormalities inside the uterus. TREATMENT  Depending on the test results, different treatments can be suggested. The type of treatment depends on the cause. 85 to 90% of infertility cases are treated with drugs or surgery.   Various fertility drugs may be used for women with ovulation problems. It is important to talk with your caregiver about the drug to be used. You should understand the drug's benefits and side effects. Depending on the type of fertility drug and the dosage of the drug used, multiple births (twins or multiples) can occur in some women.  If needed, surgery can be done to repair damage to a woman's ovaries, fallopian tubes, cervix, or uterus.  Surgery or medical treatment for endometriosis or polycystic ovary syndrome. Sometimes a man has an infertility problem that can be corrected with medicine or by surgery.  Intrauterine insemination (IUI) of sperm, timed with ovulation.  Change in lifestyle, if that is the cause (lose weight, increase exercise, and stop smoking, drinking excessively, or taking illegal drugs).  Other types of surgery:  Removing growths inside and on the  uterus.  Removing scar tissue from inside of the uterus.  Fixing blocked tubes.  Removing scar tissue in the pelvis and around the female organs. WHAT IS ASSISTED REPRODUCTIVE TECHNOLOGY (ART)?  Assisted reproductive technology (ART) is another form of special methods used to help infertile couples. ART involves handling both the woman's eggs and the man's sperm. Success rates vary and depend on many factors. ART can be expensive and time-consuming. But ART has made it possible for many couples to have children that otherwise would not have been conceived. Some methods are listed below:  In vitro fertilization (IVF). IVF is often used when a woman's fallopian tubes are blocked or when a man has low sperm counts. A drug is used to stimulate the ovaries to produce multiple eggs. Once mature, the eggs are removed and placed in a culture dish with the man's sperm for fertilization. After about 40 hours, the eggs are examined to see if they have become fertilized by the sperm and are dividing into cells. These fertilized eggs (embryos) are then placed in the woman's uterus. This bypasses the fallopian tubes.  Gamete intrafallopian transfer (GIFT) is similar to IVF, but used when the woman has at least one normal fallopian tube. Three to five eggs are placed in the fallopian tube, along with the man's sperm, for fertilization inside the woman's body.  Zygote intrafallopian transfer (ZIFT), also called tubal embryo transfer, combines IVF and GIFT. The eggs retrieved from the woman's ovaries are fertilized in the lab and placed in the fallopian tubes rather than in the uterus.  ART procedures sometimes involve the use of donor eggs (eggs from another woman) or previously frozen embryos. Donor eggs may be used if a woman has impaired ovaries or has a genetic disease that could be passed on to her baby.  When performing ART, you are at higher risk for resulting in multiple pregnancies, twins, triplets or  more.  Intracytoplasma sperm injection is a procedure that injects a single sperm into the egg to fertilize it.  Embryo transplant is a procedure that starts after growing an embryo in a special media (  chemical solution) developed to keep the embryo alive for 2 to 5 days, and then transplanting it into the uterus. In cases where a cause cannot be found and pregnancy does not occur, adoption may be a consideration. Document Released: 08/17/2003 Document Revised: 11/06/2011 Document Reviewed: 07/13/2009 [facility] Patient Information 2015 [place], [place]. This information is not intended to replace advice given to you by your health care provider. Make sure you discuss any questions you have with your health care provider.

## 2014-09-25 LAB — TSH: TSH: 1.566 u[IU]/mL (ref 0.350–4.500)

## 2014-09-25 LAB — FOLLICLE STIMULATING HORMONE: FSH: 6.1 m[IU]/mL

## 2014-09-28 ENCOUNTER — Ambulatory Visit (INDEPENDENT_AMBULATORY_CARE_PROVIDER_SITE_OTHER): Payer: BC Managed Care – PPO | Admitting: Internal Medicine

## 2014-09-28 ENCOUNTER — Telehealth: Payer: Self-pay | Admitting: *Deleted

## 2014-09-28 ENCOUNTER — Ambulatory Visit (HOSPITAL_COMMUNITY): Payer: BC Managed Care – PPO | Attending: Internal Medicine | Admitting: Cardiology

## 2014-09-28 ENCOUNTER — Encounter: Payer: Self-pay | Admitting: Internal Medicine

## 2014-09-28 ENCOUNTER — Other Ambulatory Visit (HOSPITAL_COMMUNITY): Payer: Self-pay | Admitting: Cardiology

## 2014-09-28 VITALS — BP 118/88 | HR 116 | Temp 99.2°F | Ht 69.0 in | Wt 221.2 lb

## 2014-09-28 DIAGNOSIS — I82402 Acute embolism and thrombosis of unspecified deep veins of left lower extremity: Secondary | ICD-10-CM

## 2014-09-28 DIAGNOSIS — M25569 Pain in unspecified knee: Secondary | ICD-10-CM

## 2014-09-28 DIAGNOSIS — M79605 Pain in left leg: Secondary | ICD-10-CM | POA: Insufficient documentation

## 2014-09-28 DIAGNOSIS — M79652 Pain in left thigh: Secondary | ICD-10-CM

## 2014-09-28 DIAGNOSIS — Z8672 Personal history of thrombophlebitis: Secondary | ICD-10-CM

## 2014-09-28 MED ORDER — RIVAROXABAN 15 MG PO TABS: 15.0000 mg | ORAL_TABLET | Freq: Two times a day (BID) | ORAL | Status: DC

## 2014-09-28 NOTE — Telephone Encounter (Signed)
Please let [name] know I am starting xarelto and seeing her on Wednesday She is to call 911 if any chest pain or SOB  I discussed this all with the patient and she will start the xarelto immediately--Rx sent  Please put her on my schedule for 11:15AM on Wednesday--she knows to come in then

## 2014-09-28 NOTE — Telephone Encounter (Signed)
Message received in [place] from pt stating she has developed leg pain .  Noted pt had DVT in 2009 and treated under Dr Jannifer Rodney- last contact with pt was 2013 when a doppler was ordered per Dr Jannifer Rodney with same complaint.  Doppler in 2013 was negative.  Noted pt is currently under the care of GYN for severe mengorra and is on progesterone and megace.  This RN returned call to pt and obtained verified VM- message left informing pt MD is not in the office and for best care she should proceed to the ER.  Per further chart review noted pt is scheduled with Dr Kara Pacer at 1115 today per above complaint.  appt with Dr Kara Pacer was made today at 814 ( about same time pt left message with TRIAGE ).

## 2014-09-28 NOTE — Telephone Encounter (Signed)
Call report per Gina-venus duplex-positive.

## 2014-09-28 NOTE — Progress Notes (Signed)
   Subjective:    Patient ID: [name], female    DOB: [birth year], 39 y.o.   MRN: [id number]  HPI Here for problems with leg pain  The bleeding has eased up some Now with "terrible" leg pain Hurts to walk--feels heavy on thigh. On left Feels like it did with past DVT  Leg not really swollen Doesn't seem to be having restless legs symptoms  Current Outpatient Prescriptions on File Prior to Visit  Medication Sig Dispense Refill  . megestrol (MEGACE) 40 MG tablet Take 2 tablets (80 mg total) by mouth 3 (three) times daily. (Patient not taking: Reported on 09/28/2014) 90 tablet 3   No current facility-administered medications on file prior to visit.    No Known Allergies  Past Medical History  Diagnosis Date  . DVT (deep venous thrombosis)   . Fibroids   . Breast cyst     BILATERAL  . Obesity     Past Surgical History  Procedure Laterality Date  . Myomectomy    . Cesarean section      Family History  Problem Relation Age of Onset  . Diabetes Paternal Grandmother   . Heart disease Neg Hx   . Hypertension Neg Hx   . Cancer Neg Hx     breast or colon cancer    History   Social History  . Marital Status: Married    Spouse Name: N/A    Number of Children: 1  . Years of Education: N/A   Occupational History  . 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 Smoking status: Never Smoker   . Smokeless tobacco: Never Used  . Alcohol Use: Yes     Comment: rare  . Drug Use: No  . Sexual Activity:    Partners: Male    Birth Control/ Protection: None   Other Topics Concern  . Not on file   Social History Narrative   Review of Systems Weight is stable Sleeping okay Still working as Animal nutritionist    Objective:   Physical Exam  Constitutional: She appears well-developed and well-nourished. No distress.  Musculoskeletal:  No sig edema in calves Tenderness along medial left thigh without redness/warmth  Neurological:    Mildly antalgic gait          Assessment & Plan:

## 2014-09-28 NOTE — Progress Notes (Addendum)
Bilateral lower venous duplex performed.  Results given to Dr.Letvak.

## 2014-09-28 NOTE — Progress Notes (Signed)
Pre visit review using our clinic review tool, if applicable. No additional management support is needed unless otherwise documented below in the visit note. 

## 2014-09-28 NOTE — Telephone Encounter (Signed)
She was fine when I saw her and denied SOB I think it is reasonable to try the outpatient Rx with close follow up and 911 for any concerning symptoms

## 2014-09-28 NOTE — Telephone Encounter (Signed)
Patient called back to get lab results and let us know that she did see Dr. Silvio Pate and was diagnosed with a blood clot.  Dr. Kennon Rounds recommends that she stop the Megace and begin iron supplements three times daily.  Her bleeding has currently slowed down some but is still happening and she will call us back if she continues to bleed to come in to be seen.

## 2014-09-28 NOTE — Telephone Encounter (Signed)
Gina notified as instructed by telephone. Barnett Applebaum wanted Dr. Silvio Pate to know that patient told her while she ws there that when she started with the thigh pain she was having SOB, but that was better today since she was not walking a lot on her leg. Patient added to scheduled as instructed.

## 2014-09-28 NOTE — Assessment & Plan Note (Signed)
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

## 2014-09-30 ENCOUNTER — Encounter: Payer: Self-pay | Admitting: Internal Medicine

## 2014-09-30 ENCOUNTER — Ambulatory Visit (INDEPENDENT_AMBULATORY_CARE_PROVIDER_SITE_OTHER): Payer: BC Managed Care – PPO | Admitting: Internal Medicine

## 2014-09-30 VITALS — BP 122/84 | HR 110 | Temp 98.8°F | Wt 221.0 lb

## 2014-09-30 DIAGNOSIS — N938 Other specified abnormal uterine and vaginal bleeding: Secondary | ICD-10-CM

## 2014-09-30 DIAGNOSIS — I82409 Acute embolism and thrombosis of unspecified deep veins of unspecified lower extremity: Secondary | ICD-10-CM

## 2014-09-30 DIAGNOSIS — I82402 Acute embolism and thrombosis of unspecified deep veins of left lower extremity: Secondary | ICD-10-CM

## 2014-09-30 HISTORY — DX: Acute embolism and thrombosis of unspecified deep veins of unspecified lower extremity: I82.409

## 2014-09-30 MED ORDER — RIVAROXABAN 20 MG PO TABS: 20.0000 mg | ORAL_TABLET | Freq: Every day | ORAL | Status: DC

## 2014-09-30 NOTE — Assessment & Plan Note (Addendum)
If decides on surgical procedure, I would defer for 3 months unless emergency---then can have brief time off the xarelto If is trying to conceive at some point, would need to be changed to lovenox

## 2014-09-30 NOTE — Patient Instructions (Signed)
Please start the xarelto 81m daily after 3 weeks of the 143mtwice a day.

## 2014-09-30 NOTE — Assessment & Plan Note (Signed)
Recurred without risk factors--- no injury, immobilization, etc Was not on estrogen  I think she is going to need to continue on xarelto indefinitely----risk for complication too high with thigh DVT to stop again Will start 28m daily after 3 weeks of 15bid

## 2014-09-30 NOTE — Progress Notes (Signed)
Pre visit review using our clinic review tool, if applicable. No additional management support is needed unless otherwise documented below in the visit note. 

## 2014-09-30 NOTE — Progress Notes (Signed)
   Subjective:    Patient ID: [name], female    DOB: 03/30/1976, 38 y.o.   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 Rivaroxaban (XARELTO) 15 MG TABS tablet Take 1 tablet (15 mg total) by mouth 2 (two) times daily with a meal. 42 tablet 0   No current facility-administered medications on file prior to visit.    No Known Allergies  Past Medical History  Diagnosis Date  . DVT (deep venous thrombosis)   . Fibroids   . Breast cyst     BILATERAL  . Obesity     Past Surgical History  Procedure Laterality Date  . Myomectomy    . Cesarean section      Family History  Problem Relation Age of Onset  . Diabetes Paternal Grandmother   . Heart disease Neg Hx   . Hypertension Neg Hx   . Cancer Neg Hx     breast or colon cancer    History   Social History  . Marital Status: Married    Spouse Name: N/A    Number of Children: 1  . Years of Education: N/A   Occupational History  . 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 Smoking status: Never Smoker   . Smokeless tobacco: Never Used  . Alcohol Use: Yes     Comment: rare  . Drug Use: No  . Sexual Activity:    Partners: Male    Birth Control/ Protection: None   Other Topics Concern  . Not on file   Social History Narrative    Review of Systems Sleeps okay Appetite is okay    Objective:   Physical Exam  Musculoskeletal:  No sig edema Still has medial left thigh tenderness          Assessment & Plan:

## 2014-10-06 ENCOUNTER — Encounter: Payer: Self-pay | Admitting: Family Medicine

## 2014-10-06 ENCOUNTER — Ambulatory Visit (INDEPENDENT_AMBULATORY_CARE_PROVIDER_SITE_OTHER): Payer: BC Managed Care – PPO | Admitting: Family Medicine

## 2014-10-06 ENCOUNTER — Telehealth: Payer: Self-pay | Admitting: Internal Medicine

## 2014-10-06 VITALS — BP 115/81 | HR 131 | Wt 215.0 lb

## 2014-10-06 DIAGNOSIS — D62 Acute posthemorrhagic anemia: Secondary | ICD-10-CM | POA: Diagnosis not present

## 2014-10-06 DIAGNOSIS — D251 Intramural leiomyoma of uterus: Secondary | ICD-10-CM

## 2014-10-06 DIAGNOSIS — I82402 Acute embolism and thrombosis of unspecified deep veins of left lower extremity: Secondary | ICD-10-CM | POA: Diagnosis not present

## 2014-10-06 LAB — CBC
HEMATOCRIT: 19.6 % — AB (ref 36.0–46.0)
Hemoglobin: 5.7 g/dL — CL (ref 12.0–15.0)
MCH: 19.5 pg — ABNORMAL LOW (ref 26.0–34.0)
MCHC: 29.1 g/dL — ABNORMAL LOW (ref 30.0–36.0)
MCV: 66.9 fL — ABNORMAL LOW (ref 78.0–100.0)
MPV: 8.6 fL (ref 8.6–12.4)
Platelets: 355 10*3/uL (ref 150–400)
RBC: 2.93 MIL/uL — AB (ref 3.87–5.11)
RDW: 18.3 % — ABNORMAL HIGH (ref 11.5–15.5)
WBC: 6.5 10*3/uL (ref 4.0–10.5)

## 2014-10-06 NOTE — Telephone Encounter (Signed)
Pt came in asking for note for missing work due to leg clot.  Pt said she was out of work from Feb 1 to Feb 12.  Please write note and call pt back when ready to pick up. Best number is [phone]. Thanks

## 2014-10-06 NOTE — Patient Instructions (Signed)
Deep Vein Thrombosis A deep vein thrombosis (DVT) is a blood clot that develops in the deep, larger veins of the leg, arm, or pelvis. These are more dangerous than clots that might form in veins near the surface of the body. A DVT can lead to serious and even life-threatening complications if the clot breaks off and travels in the bloodstream to the lungs.  A DVT can damage the valves in your leg veins so that instead of flowing upward, the blood pools in the lower leg. This is called post-thrombotic syndrome, and it can result in pain, swelling, discoloration, and sores on the leg. CAUSES Usually, several things contribute to the formation of blood clots. Contributing factors include:  The flow of blood slows down.  The inside of the vein is damaged in some way.  You have a condition that makes blood clot more easily. RISK FACTORS Some people are more likely than others to develop blood clots. Risk factors include:   Smoking.  Being overweight (obese).  Sitting or lying still for a long time. This includes long-distance travel, paralysis, or recovery from an illness or surgery. Other factors that increase risk are:   Older age, especially over 65 years of age.  Having a family history of blood clots or if you have already had a blot clot.  Having major or lengthy surgery. This is especially true for surgery on the hip, knee, or belly (abdomen). Hip surgery is particularly high risk.  Having a long, thin tube (catheter) placed inside a vein during a medical procedure.  Breaking a hip or leg.  Having cancer or cancer treatment.  Pregnancy and childbirth.  Hormone changes make the blood clot more easily during pregnancy.  The fetus puts pressure on the veins of the pelvis.  There is a risk of injury to veins during delivery or a caesarean delivery. The risk is highest just after childbirth.  Medicines containing the female hormone estrogen. This includes birth control pills and  hormone replacement therapy.  Other circulation or heart problems.  SIGNS AND SYMPTOMS When a clot forms, it can either partially or totally block the blood flow in that vein. Symptoms of a DVT can include:  Swelling of the leg or arm, especially if one side is much worse.  Warmth and redness of the leg or arm, especially if one side is much worse.  Pain in an arm or leg. If the clot is in the leg, symptoms may be more noticeable or worse when standing or walking. The symptoms of a DVT that has traveled to the lungs (pulmonary embolism, PE) usually start suddenly and include:  Shortness of breath.  Coughing.  Coughing up blood or blood-tinged mucus.  Chest pain. The chest pain is often worse with deep breaths.  Rapid heartbeat. Anyone with these symptoms should get emergency medical treatment right away. Do not wait to see if the symptoms will go away. Call your local emergency services (911 in the [place] if you have these symptoms. Do not drive yourself to the hospital. DIAGNOSIS If a DVT is suspected, your health care provider will take a full medical history and perform a physical exam. Tests that also may be required include:  Blood tests, including studies of the clotting properties of the blood.  Ultrasound to see if you have clots in your legs or lungs.  X-rays to show the flow of blood when dye is injected into the veins (venogram).  Studies of your lungs if you have any  chest symptoms. PREVENTION  Exercise the legs regularly. Take a brisk 30-minute walk every day.  Maintain a weight that is appropriate for your height.  Avoid sitting or lying in bed for long periods of time without moving your legs.  Women, particularly those over the age of 28 years, should consider the risks and benefits of taking estrogen medicines, including birth control pills.  Do not smoke, especially if you take estrogen medicines.  Long-distance travel can increase your risk of DVT. You  should exercise your legs by walking or pumping the muscles every hour.  Many of the risk factors above relate to situations that exist with hospitalization, either for illness, injury, or elective surgery. Prevention may include medical and nonmedical measures.  Your health care provider will assess you for the need for venous thromboembolism prevention when you are admitted to the hospital. If you are having surgery, your surgeon will assess you the day of or day after surgery. TREATMENT Once identified, a DVT can be treated. It can also be prevented in some circumstances. Once you have had a DVT, you may be at increased risk for a DVT in the future. The most common treatment for DVT is blood-thinning (anticoagulant) medicine, which reduces the blood's tendency to clot. Anticoagulants can stop new blood clots from forming and stop old clots from growing. They cannot dissolve existing clots. Your body does this by itself over time. Anticoagulants can be given by mouth, through an IV tube, or by injection. Your health care provider will determine the best program for you. Other medicines or treatments that may be used are:  Heparin or related medicines (low molecular weight heparin) are often the first treatment for a blood clot. They act quickly. However, they cannot be taken orally and must be given either in shot form or by IV tube.  Heparin can cause a fall in a component of blood that stops bleeding and forms blood clots (platelets). You will be monitored with blood tests to be sure this does not occur.  Warfarin is an anticoagulant that can be swallowed. It takes a few days to start working, so usually heparin or related medicines are used in combination. Once warfarin is working, heparin is usually stopped.  Factor Xa inhibitor medicines, such as rivaroxaban and apixaban, also reduce blood clotting. These medicines are taken orally and can often be used without heparin or related  medicines.  Less commonly, clot dissolving drugs (thrombolytics) are used to dissolve a DVT. They carry a high risk of bleeding, so they are used mainly in severe cases where your life or a part of your body is threatened.  Very rarely, a blood clot in the leg needs to be removed surgically.  If you are unable to take anticoagulants, your health care provider may arrange for you to have a filter placed in a main vein in your abdomen. This filter prevents clots from traveling to your lungs. HOME CARE INSTRUCTIONS  Take all medicines as directed by your health care provider.  Learn as much as you can about DVT.  Wear a medical alert bracelet or carry a medical alert card.  Ask your health care provider how soon you can go back to normal activities. It is important to stay active to prevent blood clots. If you are on anticoagulant medicine, avoid contact sports.  It is very important to exercise. This is especially important while traveling, sitting, or standing for long periods of time. Exercise your legs by walking or by  tightening and relaxing your leg muscles regularly. Take frequent walks.  You may need to wear compression stockings. These are tight elastic stockings that apply pressure to the lower legs. This pressure can help keep the blood in the legs from clotting. Taking Warfarin Warfarin is a daily medicine that is taken by mouth. Your health care provider will advise you on the length of treatment (usually 3-6 months, sometimes lifelong). If you take warfarin:  Understand how to take warfarin and foods that can affect how warfarin works in Veterinary surgeon.  Too much and too little warfarin are both dangerous. Too much warfarin increases the risk of bleeding. Too little warfarin continues to allow the risk for blood clots. Warfarin and Regular Blood Testing While taking warfarin, you will need to have regular blood tests to measure your blood clotting time. These blood tests usually  include both the prothrombin time (PT) and international normalized ratio (INR) tests. The PT and INR results allow your health care provider to adjust your dose of warfarin. It is very important that you have your PT and INR tested as often as directed by your health care provider.  Warfarin and Your Diet Avoid major changes in your diet, or notify your health care provider before changing your diet. Arrange a visit with a registered dietitian to answer your questions. Many foods, especially foods high in vitamin K, can interfere with warfarin and affect the PT and INR results. You should eat a consistent amount of foods high in vitamin K. Foods high in vitamin K include:   Spinach, kale, broccoli, cabbage, collard and turnip greens, Brussels sprouts, peas, cauliflower, seaweed, and parsley.  Beef and pork liver.  Green tea.  Soybean oil. Warfarin with Other Medicines Many medicines can interfere with warfarin and affect the PT and INR results. You must:  Tell your health care provider about any and all medicines, vitamins, and supplements you take, including aspirin and other over-the-counter anti-inflammatory medicines. Be especially cautious with aspirin and anti-inflammatory medicines. Ask your health care provider before taking these.  Do not take or discontinue any prescribed or over-the-counter medicine except on the advice of your health care provider or pharmacist. Warfarin Side Effects Warfarin can have side effects, such as easy bruising and difficulty stopping bleeding. Ask your health care provider or pharmacist about other side effects of warfarin. You will need to:  Hold pressure over cuts for longer than usual.  Notify your dentist and other health care providers that you are taking warfarin before you undergo any procedures where bleeding may occur. Warfarin with Alcohol and Tobacco   Drinking alcohol frequently can increase the effect of warfarin, leading to excess  bleeding. It is best to avoid alcoholic drinks or to consume only very small amounts while taking warfarin. Notify your health care provider if you change your alcohol intake.   Do not use any tobacco products including cigarettes, chewing tobacco, or electronic cigarettes. If you smoke, quit. Ask your health care provider for help with quitting smoking. Alternative Medicines to Warfarin: Factor Xa Inhibitor Medicines  These blood-thinning medicines are taken by mouth, usually for several weeks or longer. It is important to take the medicine every single day at the same time each day.  There are no regular blood tests required when using these medicines.  There are fewer food and drug interactions than with warfarin.  The side effects of this class of medicine are similar to those of warfarin, including excessive bruising or bleeding. Ask your  health care provider or pharmacist about other potential side effects. SEEK MEDICAL CARE IF:  You notice a rapid heartbeat.  You feel weaker or more tired than usual.  You feel faint.  You notice increased bruising.  You feel your symptoms are not getting better in the time expected.  You believe you are having side effects of medicine. SEEK IMMEDIATE MEDICAL CARE IF:  You have chest pain.  You have trouble breathing.  You have new or increased swelling or pain in one leg.  You cough up blood.  You notice blood in vomit, in a bowel movement, or in urine. MAKE SURE YOU:  Understand these instructions.  Will watch your condition.  Will get help right away if you are not doing well or get worse. Document Released: 08/14/2005 Document Revised: 12/29/2013 Document Reviewed: 04/21/2013 [facility] Patient Information 2015 [place], [place]. This information is not intended to replace advice given to you by your health care provider. Make sure you discuss any questions you have with your health care provider. Uterine Fibroid A uterine fibroid  is a growth (tumor) that occurs in your uterus. This type of tumor is not cancerous and does not spread out of the uterus. You can have one or many fibroids. Fibroids can vary in size, weight, and where they grow in the uterus. Some can become quite large. Most fibroids do not require medical treatment, but some can cause pain or heavy bleeding during and between periods. CAUSES  A fibroid is the result of a single uterine cell that keeps growing (unregulated), which is different than most cells in the human body. Most cells have a control mechanism that keeps them from reproducing without control.  SIGNS AND SYMPTOMS   Bleeding.  Pelvic pain and pressure.  Bladder problems due to the size of the fibroid.  Infertility and miscarriages depending on the size and location of the fibroid. DIAGNOSIS  Uterine fibroids are diagnosed through a physical exam. Your health care provider may feel the lumpy tumors during a pelvic exam. Ultrasonography may be done to get information regarding size, location, and number of tumors.  TREATMENT   Your health care provider may recommend watchful waiting. This involves getting the fibroid checked by your health care provider to see if it grows or shrinks.   Hormone treatment or an intrauterine device (IUD) may be prescribed.   Surgery may be needed to remove the fibroids (myomectomy) or the uterus (hysterectomy). This depends on your situation. When fibroids interfere with fertility and a woman wants to become pregnant, a health care provider may recommend having the fibroids removed.  [place] care depends on how you were treated. In general:   Keep all follow-up appointments with your health care provider.   Only take over-the-counter or prescription medicines as directed by your health care provider. If you were prescribed a hormone treatment, take the hormone medicines exactly as directed. Do not take aspirin. It can cause bleeding.    Talk to your health care provider about taking iron pills.  If your periods are troublesome but not so heavy, lie down with your feet raised slightly above your heart. Place cold packs on your lower abdomen.   If your periods are heavy, write down the number of pads or tampons you use per month. Bring this information to your health care provider.   Include green vegetables in your diet.  SEEK IMMEDIATE MEDICAL CARE IF:  You have pelvic pain or cramps not controlled with  medicines.   You have a sudden increase in pelvic pain.   You have an increase in bleeding between and during periods.   You have excessive periods and soak tampons or pads in a half hour or less.  You feel lightheaded or have fainting episodes. Document Released: 08/11/2000 Document Revised: 06/04/2013 Document Reviewed: 03/13/2013 [facility] Patient Information 2015 [place], [place]. This information is not intended to replace advice given to you by your health care provider. Make sure you discuss any questions you have with your health care provider.

## 2014-10-06 NOTE — Assessment & Plan Note (Signed)
On Xarelto, which is worsening bleeding.

## 2014-10-06 NOTE — Assessment & Plan Note (Signed)
Given multiple medical issues, would prefer non-invasive techniques. We will refer to [facility] for MR guided [place] techniques, if pt. Is candidate.

## 2014-10-06 NOTE — Assessment & Plan Note (Signed)
Continued bleeding, will check CBC today and give blood if needed.

## 2014-10-06 NOTE — Progress Notes (Signed)
    Subjective:    Patient ID: [name] is a 39 y.o. female presenting with Follow-up  on 10/06/2014  HPI: Pt. With fibroids, s/p previous myomectomy and subsequent DVT. Came in with bleeding, w/u revealed continued fibroids.  Initially trying to preserve fertility.  Placed on Megace 40 tid, and bleeding failed to respond.  Noted to be anemic, Megace increased to 80 mg tid and then she developed another DVT. Returns with continued bleeding.   Megace has been held due to DVT. Her bleeding has worsened due to Xarelto use.  Already profoundly anemic with Hgb 7.5.  Review of Systems  Constitutional: Negative for fever and chills.  Respiratory: Positive for shortness of breath (mild).   Cardiovascular: Negative for chest pain.  Gastrointestinal: Negative for nausea, vomiting and abdominal pain.  Genitourinary: Negative for dysuria.  Skin: Negative for rash.  Neurological: Positive for light-headedness (mild).      Objective:    BP 115/81 mmHg  Pulse 131  Wt 215 lb (97.523 kg) Physical Exam  Constitutional: She is oriented to person, place, and time. She appears well-developed and well-nourished. No distress.  HENT:  Head: Normocephalic and atraumatic.  Eyes: No scleral icterus.  Neck: Neck supple.  Cardiovascular: Normal rate.   Pulmonary/Chest: Effort normal.  Abdominal: Soft.  Neurological: She is alert and oriented to person, place, and time.  Skin: Skin is warm and dry.  Psychiatric: She has a normal mood and affect.        Assessment & Plan:   Problem List Items Addressed This Visit      High   Fibroids    Given multiple medical issues, would prefer non-invasive techniques. We will refer to [facility] for MR guided [place] techniques, if pt. Is candidate.        Unprioritized   Left leg DVT    On Xarelto, which is worsening bleeding.      Acute blood loss anemia - Primary    Continued bleeding, will check CBC today and give blood if needed.      Relevant  Medications   ferrous sulfate 325 (65 FE) MG tablet   Other Relevant Orders   CBC       Return in about 4 weeks (around 11/03/2014) for a follow-up.

## 2014-10-07 ENCOUNTER — Observation Stay (HOSPITAL_COMMUNITY)
Admission: EM | Admit: 2014-10-07 | Discharge: 2014-10-08 | Disposition: A | Discharge status: Other Acute Inpatient Hospital | Payer: BC Managed Care – PPO | Attending: Obstetrics & Gynecology | Admitting: Obstetrics & Gynecology

## 2014-10-07 ENCOUNTER — Encounter (HOSPITAL_COMMUNITY): Payer: Self-pay

## 2014-10-07 DIAGNOSIS — D62 Acute posthemorrhagic anemia: Secondary | ICD-10-CM

## 2014-10-07 DIAGNOSIS — N939 Abnormal uterine and vaginal bleeding, unspecified: Secondary | ICD-10-CM

## 2014-10-07 DIAGNOSIS — D649 Anemia, unspecified: Secondary | ICD-10-CM | POA: Insufficient documentation

## 2014-10-07 DIAGNOSIS — N938 Other specified abnormal uterine and vaginal bleeding: Secondary | ICD-10-CM | POA: Diagnosis present

## 2014-10-07 LAB — PREPARE RBC (CROSSMATCH)

## 2014-10-07 LAB — CBC WITH DIFFERENTIAL/PLATELET
BASOS PCT: 0 % (ref 0–1)
Basophils Absolute: 0 10*3/uL (ref 0.0–0.1)
EOS PCT: 1 % (ref 0–5)
Eosinophils Absolute: 0.1 10*3/uL (ref 0.0–0.7)
HCT: 18.1 % — ABNORMAL LOW (ref 36.0–46.0)
HEMOGLOBIN: 5 g/dL — AB (ref 12.0–15.0)
LYMPHS ABS: 2.2 10*3/uL (ref 0.7–4.0)
Lymphocytes Relative: 22 % (ref 12–46)
MCH: 19.5 pg — ABNORMAL LOW (ref 26.0–34.0)
MCHC: 27.6 g/dL — AB (ref 30.0–36.0)
MCV: 70.7 fL — AB (ref 78.0–100.0)
Monocytes Absolute: 0.6 10*3/uL (ref 0.1–1.0)
Monocytes Relative: 6 % (ref 3–12)
Neutro Abs: 7.3 10*3/uL (ref 1.7–7.7)
Neutrophils Relative %: 71 % (ref 43–77)
Platelets: 367 10*3/uL (ref 150–400)
RBC: 2.56 MIL/uL — AB (ref 3.87–5.11)
RDW: 19.1 % — ABNORMAL HIGH (ref 11.5–15.5)
WBC: 10.2 10*3/uL (ref 4.0–10.5)

## 2014-10-07 LAB — BASIC METABOLIC PANEL
Anion gap: 9 (ref 5–15)
BUN: 9 mg/dL (ref 6–23)
CO2: 20 mmol/L (ref 19–32)
CREATININE: 1.11 mg/dL — AB (ref 0.50–1.10)
Calcium: 8.8 mg/dL (ref 8.4–10.5)
Chloride: 109 mmol/L (ref 96–112)
GFR calc Af Amer: 72 mL/min — ABNORMAL LOW (ref >90)
GFR, EST NON AFRICAN AMERICAN: 62 mL/min — AB (ref >90)
Glucose, Bld: 163 mg/dL — ABNORMAL HIGH (ref 70–99)
POTASSIUM: 3.7 mmol/L (ref 3.5–5.1)
Sodium: 138 mmol/L (ref 135–145)

## 2014-10-07 LAB — I-STAT CHEM 8, ED
BUN: 10 mg/dL (ref 6–23)
CHLORIDE: 105 mmol/L (ref 96–112)
CREATININE: 0.9 mg/dL (ref 0.50–1.10)
Calcium, Ion: 1.18 mmol/L (ref 1.12–1.23)
Glucose, Bld: 160 mg/dL — ABNORMAL HIGH (ref 70–99)
HCT: 19 % — ABNORMAL LOW (ref 36.0–46.0)
Hemoglobin: 6.5 g/dL — CL (ref 12.0–15.0)
Potassium: 3.7 mmol/L (ref 3.5–5.1)
Sodium: 140 mmol/L (ref 135–145)
TCO2: 16 mmol/L (ref 0–100)

## 2014-10-07 LAB — CBC
HCT: 23.5 % — ABNORMAL LOW (ref 36.0–46.0)
Hemoglobin: 7.7 g/dL — ABNORMAL LOW (ref 12.0–15.0)
MCH: 25.5 pg — AB (ref 26.0–34.0)
MCHC: 32.8 g/dL (ref 30.0–36.0)
MCV: 77.8 fL — AB (ref 78.0–100.0)
Platelets: 232 10*3/uL (ref 150–400)
RBC: 3.02 MIL/uL — ABNORMAL LOW (ref 3.87–5.11)
RDW: 21.1 % — ABNORMAL HIGH (ref 11.5–15.5)
WBC: 10.5 10*3/uL (ref 4.0–10.5)

## 2014-10-07 LAB — I-STAT BETA HCG BLOOD, ED (MC, WL, AP ONLY)

## 2014-10-07 LAB — ABO/RH: ABO/RH(D): B POS

## 2014-10-07 MED ORDER — SODIUM CHLORIDE 0.9 % IJ SOLN
3.0000 mL | Freq: Two times a day (BID) | INTRAMUSCULAR | Status: DC
  Administered 2014-10-07: 3 mL via INTRAVENOUS

## 2014-10-07 MED ORDER — SODIUM CHLORIDE 0.9 % IV SOLN: Freq: Once | INTRAVENOUS | Status: DC

## 2014-10-07 MED ORDER — DIPHENHYDRAMINE HCL 25 MG PO CAPS
25.0000 mg | ORAL_CAPSULE | Freq: Once | ORAL | Status: AC
  Administered 2014-10-07: 25 mg via ORAL
  Filled 2014-10-07: qty 1

## 2014-10-07 MED ORDER — ACETAMINOPHEN 325 MG PO TABS: 650.0000 mg | ORAL_TABLET | Freq: Four times a day (QID) | ORAL | Status: DC | PRN

## 2014-10-07 MED ORDER — SODIUM CHLORIDE 0.9 % IJ SOLN: 3.0000 mL | INTRAMUSCULAR | Status: DC | PRN

## 2014-10-07 MED ORDER — RIVAROXABAN 20 MG PO TABS
20.0000 mg | ORAL_TABLET | Freq: Every day | ORAL | Status: DC
  Administered 2014-10-07 – 2014-10-08 (×2): 20 mg via ORAL
  Filled 2014-10-07 (×2): qty 1

## 2014-10-07 MED ORDER — ZOLPIDEM TARTRATE 5 MG PO TABS: 5.0000 mg | ORAL_TABLET | Freq: Every evening | ORAL | Status: DC | PRN

## 2014-10-07 MED ORDER — ACETAMINOPHEN 325 MG PO TABS
650.0000 mg | ORAL_TABLET | Freq: Once | ORAL | Status: AC
  Administered 2014-10-07: 650 mg via ORAL
  Filled 2014-10-07: qty 2

## 2014-10-07 MED ORDER — ACETAMINOPHEN 650 MG RE SUPP: 650.0000 mg | Freq: Four times a day (QID) | RECTAL | Status: DC | PRN

## 2014-10-07 MED ORDER — SODIUM CHLORIDE 0.9 % IV BOLUS (SEPSIS)
1000.0000 mL | Freq: Once | INTRAVENOUS | Status: AC
Start: 2014-10-07 — End: 2014-10-07
  Administered 2014-10-07: 1000 mL via INTRAVENOUS

## 2014-10-07 MED ORDER — LACTATED RINGERS IV SOLN
INTRAVENOUS | Status: DC
  Administered 2014-10-07 – 2014-10-08 (×4): via INTRAVENOUS

## 2014-10-07 MED ORDER — SODIUM CHLORIDE 0.9 % IV SOLN: 250.0000 mL | INTRAVENOUS | Status: DC | PRN

## 2014-10-07 MED ORDER — FERROUS SULFATE 325 (65 FE) MG PO TABS
325.0000 mg | ORAL_TABLET | Freq: Three times a day (TID) | ORAL | Status: DC
  Administered 2014-10-07 – 2014-10-08 (×2): 325 mg via ORAL
  Filled 2014-10-07 (×2): qty 1

## 2014-10-07 NOTE — Telephone Encounter (Signed)
Okay to give her out of work note for those dates

## 2014-10-07 NOTE — H&P (Signed)
[name] is an 39 y.o. female. Admitted for DUB and symptomatic anemia.  Pt was seen yesterday in the ofc at [facility] and c/o bleeding since November.  She reports that she was placed on Xeralto for a DVT 9 days ago and her bleeding became worse.  She reports that this am she had a presyncopal episode and was taken to [facility] via EMS.  She received 1 until of PRBCs and was transferred here for further management.  She was initially undecided regarding future childbearing but, reports that she understands the need to stop the bleeding.  She has been sexually active without contraception for >6years with no subsequent pregnancy.  She is intereseted in an endometrial ablation in the am as a temporizing procedure to control the bleeding.  Of note she was dx'd with a DVT on Megace 45m tid 9 days prev.  She had a prev clot x2 once following a myomectomy and once during her pregnancy while anticoagulated.  She reports that her left leg has improved on Xarelto and she deneis SOB, DOE or difficulty breathing.  She reports that she is still actively bleeding and c/o continued passage of clots today. She reports changing her pad q 1-2 hours.     Pertinent Gynecological History: Bleeding: intermenstrual bleeding, dysfunctional uterine bleeding and continuous since Nov Contraception: none DES exposure: denies Previous GYN Procedures: s/p myomectomy  OB History: G1, P1   Menstrual History:  Patient's last menstrual period was 08/23/2014.    Past Medical History  Diagnosis Date  . DVT (deep venous thrombosis)   . Fibroids   . Breast cyst     BILATERAL  . Obesity     Past Surgical History  Procedure Laterality Date  . Myomectomy    . Cesarean section      Family History  Problem Relation Age of Onset  . Diabetes Paternal Grandmother   . Heart disease Neg Hx   . Hypertension Neg Hx   . Cancer Neg Hx     breast or colon cancer    Social History:  reports that she has never smoked. She has  never used smokeless tobacco. She reports that she drinks alcohol. She reports that she does not use illicit drugs.  Allergies: No Known Allergies  Prescriptions prior to admission  Medication Sig Dispense Refill Last Dose  . Ascorbic Acid (VITAMIN C PO) Take 1 tablet by mouth daily.   10/07/2014 at Unknown time  . ferrous sulfate 325 (65 FE) MG tablet Take 325 mg by mouth 3 (three) times daily with meals.   10/07/2014 at Unknown time  . rivaroxaban (XARELTO) 20 MG TABS tablet Take 1 tablet (20 mg total) by mouth daily with supper. 30 tablet 11 10/06/2014 at Unknown time    ROS  Blood pressure 126/82, pulse 106, temperature 99.1 F (37.3 C), temperature source Oral, resp. rate 18, last menstrual period 08/23/2014, SpO2 100 %. Physical ExamPt in NAD When I first entered the room pt was sitting on toilet with a full pad and was passing clots. Lungs: CTA CV: RRR Abd: soft, NT, ND.  Well healed transverse incision   Results for orders placed or performed during the hospital encounter of 10/07/14 (from the past 24 hour(s))  CBC with Differential/Platelet     Status: Abnormal   Collection Time: 10/07/14  9:52 AM  Result Value Ref Range   WBC 10.2 4.0 - 10.5 K/uL   RBC 2.56 (L) 3.87 - 5.11 MIL/uL   Hemoglobin 5.0 (LL)  12.0 - 15.0 g/dL   HCT 18.1 (L) 36.0 - 46.0 %   MCV 70.7 (L) 78.0 - 100.0 fL   MCH 19.5 (L) 26.0 - 34.0 pg   MCHC 27.6 (L) 30.0 - 36.0 g/dL   RDW 19.1 (H) 11.5 - 15.5 %   Platelets 367 150 - 400 K/uL   Neutrophils Relative % 71 43 - 77 %   Lymphocytes Relative 22 12 - 46 %   Monocytes Relative 6 3 - 12 %   Eosinophils Relative 1 0 - 5 %   Basophils Relative 0 0 - 1 %   Neutro Abs 7.3 1.7 - 7.7 K/uL   Lymphs Abs 2.2 0.7 - 4.0 K/uL   Monocytes Absolute 0.6 0.1 - 1.0 K/uL   Eosinophils Absolute 0.1 0.0 - 0.7 K/uL   Basophils Absolute 0.0 0.0 - 0.1 K/uL   RBC Morphology MARKED POLYCHROMASIA   Basic metabolic panel     Status: Abnormal   Collection Time: 10/07/14  9:52 AM   Result Value Ref Range   Sodium 138 135 - 145 mmol/L   Potassium 3.7 3.5 - 5.1 mmol/L   Chloride 109 96 - 112 mmol/L   CO2 20 19 - 32 mmol/L   Glucose, Bld 163 (H) 70 - 99 mg/dL   BUN 9 6 - 23 mg/dL   Creatinine, Ser 1.11 (H) 0.50 - 1.10 mg/dL   Calcium 8.8 8.4 - 10.5 mg/dL   GFR calc non Af Amer 62 (L) >90 mL/min   GFR calc Af Amer 72 (L) >90 mL/min   Anion gap 9 5 - 15  Type and screen     Status: None (Preliminary result)   Collection Time: 10/07/14  9:56 AM  Result Value Ref Range   ABO/RH(D) B POS    Antibody Screen NEG    Sample Expiration 10/10/2014    Unit Number [id number]    Blood Component Type RED CELLS,LR    Unit division 00    Status of Unit ISSUED    Transfusion Status OK TO TRANSFUSE    Crossmatch Result Compatible    Unit Number [id number]    Blood Component Type RED CELLS,LR    Unit division 00    Status of Unit ISSUED    Transfusion Status OK TO TRANSFUSE    Crossmatch Result Compatible   Prepare RBC     Status: None   Collection Time: 10/07/14  9:56 AM  Result Value Ref Range   Order Confirmation ORDER PROCESSED BY BLOOD BANK   ABO/Rh     Status: None   Collection Time: 10/07/14  9:56 AM  Result Value Ref Range   ABO/RH(D) B POS   I-Stat Beta hCG blood, ED ([facility], [facility], [facility] only)     Status: None   Collection Time: 10/07/14 11:30 AM  Result Value Ref Range   I-stat hCG, quantitative <5.0 <5 mIU/mL   Comment 3          I-stat chem 8, ed     Status: Abnormal   Collection Time: 10/07/14 11:32 AM  Result Value Ref Range   Sodium 140 135 - 145 mmol/L   Potassium 3.7 3.5 - 5.1 mmol/L   Chloride 105 96 - 112 mmol/L   BUN 10 6 - 23 mg/dL   Creatinine, Ser 0.90 0.50 - 1.10 mg/dL   Glucose, Bld 160 (H) 70 - 99 mg/dL   Calcium, Ion 1.18 1.12 - 1.23 mmol/L   TCO2 16 0 - 100 mmol/L  Hemoglobin 6.5 (LL) 12.0 - 15.0 g/dL   HCT 19.0 (L) 36.0 - 46.0 %   Comment NOTIFIED PHYSICIAN   Prepare RBC     Status: None   Collection Time: 10/07/14  3:00 PM   Result Value Ref Range   Order Confirmation ORDER PROCESSED BY BLOOD BANK   Type and screen     Status: None (Preliminary result)   Collection Time: 10/07/14  3:15 PM  Result Value Ref Range   ABO/RH(D) B POS    Antibody Screen NEG    Sample Expiration 10/10/2014    Unit Number [id number]    Blood Component Type RED CELLS,LR    Unit division 00    Status of Unit ALLOCATED    Transfusion Status OK TO TRANSFUSE    Crossmatch Result Compatible    Unit Number [id number]    Blood Component Type RED CELLS,LR    Unit division 00    Status of Unit ALLOCATED    Transfusion Status OK TO TRANSFUSE    Crossmatch Result Compatible   CBC     Status: Abnormal   Collection Time: 10/07/14  3:15 PM  Result Value Ref Range   WBC 10.5 4.0 - 10.5 K/uL   RBC 3.02 (L) 3.87 - 5.11 MIL/uL   Hemoglobin 7.7 (L) 12.0 - 15.0 g/dL   HCT 23.5 (L) 36.0 - 46.0 %   MCV 77.8 (L) 78.0 - 100.0 fL   MCH 25.5 (L) 26.0 - 34.0 pg   MCHC 32.8 30.0 - 36.0 g/dL   RDW 21.1 (H) 11.5 - 15.5 %   Platelets 232 150 - 400 K/uL    No results found.  Assessment/Plan: DUB with symptomatic anemia in pt with multiple DVT.  I have explained to the pt in detail that we are limited in her treatment options given her h/o DVT's.  She is not a candidate for EES or Progestins.  She is also not a candidate for TXA.  I have reviewed with the pt that she has been referred to [facility] for MRI guided fibroid embolization HOWEVER< they are reviewing her chart and will contact her.  This is not an emergent option.  I have reviewed endometrial ablation and [place] as immediate options to control current bleeding.  I have explained to there that this may affect her fertility.  She expressed understanding and after speaking to her for >36mn she is wants to proceed with blood transfusion and endometrial ablation in the am.    Patient desires surgical management with hysteroscopy and endometrial ablation.  The risks of surgery were discussed in  detail with the patient including but not limited to: bleeding which may require transfusion or reoperation; infection which may require prolonged hospitalization or re-hospitalization and antibiotic therapy; injury to bowel, bladder, ureters and major vessels or other surrounding organs; need for additional procedures including laparotomy; thromboembolic phenomenon, incisional problems and other postoperative or anesthesia complications.  Patient was told that the likelihood that her condition and symptoms will be treated effectively with this surgical management was very high; the postoperative expectations were also discussed in detail. The patient also understands the alternative treatment options which were discussed in full. All questions were answered.    Transfuse 2 untis of PRBC's Repeat CBC in am NPO after MN  HARRAWAY-SMITH, Sharnetta Gielow 10/07/2014, 4:57 PM

## 2014-10-07 NOTE — ED Notes (Signed)
2nd unit remains hanging and sent to [facility] via [facility]

## 2014-10-07 NOTE — ED Notes (Signed)
Results of chem 8 given to [place], [place]

## 2014-10-07 NOTE — ED Notes (Signed)
Per [facility], pt from home for near syncope and vaginal bleeding. Pt states she has been having her menses now since December. This is irregular for pt and was placed on megesterone 3-4 weeks prior to learning she had a DVT in her left leg last Monday and was placed on xarelto. Pt is no longer taking megesterone but is still taking xarelto. States she was in the tub this morning and felt dizzy but did not pass out. Pt is pale and has an 18g to her RAC.

## 2014-10-07 NOTE — ED Provider Notes (Signed)
CSN: [id number]     Arrival date & time 10/07/14  3762 History   First MD Initiated Contact with Patient 10/07/14 [phone]     Chief Complaint  Patient presents with  . Near Syncope     (Consider location/radiation/quality/duration/timing/severity/associated sxs/prior Treatment) HPI Comments: Patient presents to the emergency department with chief complaint of near syncope and vaginal bleeding. She states that she has been having vaginal bleeding since December. She states that she was taking Megace, but then was diagnosed with a DVT, and was placed on Xarelto.  Patient states that she has had persistent vaginal bleeding. She was seen yesterday [facility] and was referred to [facility] for MR guided ultrasound for fibroids. Patient was informed this morning by [facility] that her hemoglobin was critical, and that she needed to come to the emergency department for blood transfusion. She reports feeling dizzy and lightheaded. She has felt very weak. She denies any chest pain or shortness of breath.  The history is provided by the patient. No language interpreter was used.    Past Medical History  Diagnosis Date  . DVT (deep venous thrombosis)   . Fibroids   . Breast cyst     BILATERAL  . Obesity    Past Surgical History  Procedure Laterality Date  . Myomectomy    . Cesarean section     Family History  Problem Relation Age of Onset  . Diabetes Paternal Grandmother   . Heart disease Neg Hx   . Hypertension Neg Hx   . Cancer Neg Hx     breast or colon cancer   History  Substance Use Topics  . Smoking status: Never Smoker   . Smokeless tobacco: Never Used  . Alcohol Use: Yes     Comment: rare   OB History    Gravida Para Term Preterm AB TAB SAB Ectopic Multiple Living   1 1        1      Review of Systems  Constitutional: Negative for fever and chills.  Respiratory: Negative for shortness of breath.   Cardiovascular: Negative for chest pain.   Gastrointestinal: Negative for nausea, vomiting, diarrhea and constipation.  Genitourinary: Positive for vaginal bleeding. Negative for dysuria.  All other systems reviewed and are negative.     Allergies  Review of patient's allergies indicates no known allergies.  Home Medications   Prior to Admission medications   Medication Sig Start Date End Date Taking? Authorizing Provider  ferrous sulfate 325 (65 FE) MG tablet Take 325 mg by mouth 3 (three) times daily with meals.    Historical Provider, MD  rivaroxaban (XARELTO) 20 MG TABS tablet Take 1 tablet (20 mg total) by mouth daily with supper. 09/30/14   Venia Carbon, MD   BP 106/69 mmHg  Pulse 116  Temp(Src) 98.5 F (36.9 C) (Oral)  Resp 22  SpO2 100%  LMP 08/23/2014 Physical Exam  Constitutional: She is oriented to person, place, and time. She appears well-developed and well-nourished.  HENT:  Head: Normocephalic and atraumatic.  Pale, dry mucous membranes  Eyes: Conjunctivae and EOM are normal. Pupils are equal, round, and reactive to light.  Neck: Normal range of motion. Neck supple.  Cardiovascular: Regular rhythm.  Exam reveals no gallop and no friction rub.   No murmur heard. Tachycardic  Pulmonary/Chest: Effort normal and breath sounds normal. No respiratory distress. She has no wheezes. She has no rales. She exhibits no tenderness.  Abdominal: Soft. Bowel sounds are normal. She  exhibits no distension and no mass. There is no tenderness. There is no rebound and no guarding.  Musculoskeletal: Normal range of motion. She exhibits no edema or tenderness.  Neurological: She is alert and oriented to person, place, and time.  Skin: Skin is warm and dry. There is pallor.  Psychiatric: She has a normal mood and affect. Her behavior is normal. Judgment and thought content normal.  Nursing note and vitals reviewed.   ED Course  Procedures (including critical care time) Results for orders placed or performed during the  hospital encounter of 10/07/14  CBC with Differential/Platelet  Result Value Ref Range   WBC 10.2 4.0 - 10.5 K/uL   RBC 2.56 (L) 3.87 - 5.11 MIL/uL   Hemoglobin 5.0 (LL) 12.0 - 15.0 g/dL   HCT 18.1 (L) 36.0 - 46.0 %   MCV 70.7 (L) 78.0 - 100.0 fL   MCH 19.5 (L) 26.0 - 34.0 pg   MCHC 27.6 (L) 30.0 - 36.0 g/dL   RDW 19.1 (H) 11.5 - 15.5 %   Platelets 367 150 - 400 K/uL   Neutrophils Relative % 71 43 - 77 %   Lymphocytes Relative 22 12 - 46 %   Monocytes Relative 6 3 - 12 %   Eosinophils Relative 1 0 - 5 %   Basophils Relative 0 0 - 1 %   Neutro Abs 7.3 1.7 - 7.7 K/uL   Lymphs Abs 2.2 0.7 - 4.0 K/uL   Monocytes Absolute 0.6 0.1 - 1.0 K/uL   Eosinophils Absolute 0.1 0.0 - 0.7 K/uL   Basophils Absolute 0.0 0.0 - 0.1 K/uL   RBC Morphology MARKED POLYCHROMASIA   Basic metabolic panel  Result Value Ref Range   Sodium 138 135 - 145 mmol/L   Potassium 3.7 3.5 - 5.1 mmol/L   Chloride 109 96 - 112 mmol/L   CO2 20 19 - 32 mmol/L   Glucose, Bld 163 (H) 70 - 99 mg/dL   BUN 9 6 - 23 mg/dL   Creatinine, Ser 1.11 (H) 0.50 - 1.10 mg/dL   Calcium 8.8 8.4 - 10.5 mg/dL   GFR calc non Af Amer 62 (L) >90 mL/min   GFR calc Af Amer 72 (L) >90 mL/min   Anion gap 9 5 - 15  CBC  Result Value Ref Range   WBC 10.5 4.0 - 10.5 K/uL   RBC 3.02 (L) 3.87 - 5.11 MIL/uL   Hemoglobin 7.7 (L) 12.0 - 15.0 g/dL   HCT 23.5 (L) 36.0 - 46.0 %   MCV 77.8 (L) 78.0 - 100.0 fL   MCH 25.5 (L) 26.0 - 34.0 pg   MCHC 32.8 30.0 - 36.0 g/dL   RDW 21.1 (H) 11.5 - 15.5 %   Platelets 232 150 - 400 K/uL  I-stat chem 8, ed  Result Value Ref Range   Sodium 140 135 - 145 mmol/L   Potassium 3.7 3.5 - 5.1 mmol/L   Chloride 105 96 - 112 mmol/L   BUN 10 6 - 23 mg/dL   Creatinine, Ser 0.90 0.50 - 1.10 mg/dL   Glucose, Bld 160 (H) 70 - 99 mg/dL   Calcium, Ion 1.18 1.12 - 1.23 mmol/L   TCO2 16 0 - 100 mmol/L   Hemoglobin 6.5 (LL) 12.0 - 15.0 g/dL   HCT 19.0 (L) 36.0 - 46.0 %   Comment NOTIFIED PHYSICIAN   I-Stat Beta hCG  blood, ED ([facility], [facility], [facility] only)  Result Value Ref Range   I-stat hCG, quantitative <5.0 <5  mIU/mL   Comment 3          Type and screen  Result Value Ref Range   ABO/RH(D) B POS    Antibody Screen NEG    Sample Expiration 10/10/2014    Unit Number [id number]    Blood Component Type RED CELLS,LR    Unit division 00    Status of Unit ISSUED    Transfusion Status OK TO TRANSFUSE    Crossmatch Result Compatible    Unit Number [id number]    Blood Component Type RED CELLS,LR    Unit division 00    Status of Unit ISSUED    Transfusion Status OK TO TRANSFUSE    Crossmatch Result Compatible   Prepare RBC  Result Value Ref Range   Order Confirmation ORDER PROCESSED BY BLOOD BANK   ABO/Rh  Result Value Ref Range   ABO/RH(D) B POS    No results found.     EKG Interpretation None      MDM   Final diagnoses:  Vaginal bleeding  Acute blood loss anemia   Patient with critical hemoglobin, requiring blood transfusion. Patient has had persistent vaginal bleeding, and is taking Xarelto.  Will give fluids and blood in the emergency department. Anticipate transfer to [facility]. Will check pelvic exam to rule out active hemorrhage.  Patient discussed with Dr. Ihor Dow from [facility].  Will transfuse 2 units here and transfer to [facility].  Plan for ablation.   Patient seen by and discussed with Dr. Betsey Holiday.  Medications  0.9 %  sodium chloride infusion (not administered)  sodium chloride 0.9 % injection 3 mL (not administered)  sodium chloride 0.9 % injection 3 mL (not administered)  0.9 %  sodium chloride infusion (not administered)  acetaminophen (TYLENOL) tablet 650 mg (not administered)    Or  acetaminophen (TYLENOL) suppository 650 mg (not administered)  zolpidem (AMBIEN) tablet 5 mg (not administered)  0.9 %  sodium chloride infusion (not administered)  diphenhydrAMINE (BENADRYL) capsule 25 mg (not administered)  acetaminophen (TYLENOL) tablet 650 mg (not  administered)  sodium chloride 0.9 % bolus 1,000 mL (0 mLs Intravenous Stopped 10/07/14 1129)     CRITICAL CARE Performed by: Montine Circle   Total critical care time: 45  Critical care time was exclusive of separately billable procedures and treating other patients.  Critical care was necessary to treat or prevent imminent or life-threatening deterioration.  Critical care was time spent personally by me on the following activities: development of treatment plan with patient and/or surrogate as well as nursing, discussions with consultants, evaluation of patient's response to treatment, examination of patient, obtaining history from patient or surrogate, ordering and performing treatments and interventions, ordering and review of laboratory studies, ordering and review of radiographic studies, pulse oximetry and re-evaluation of patient's condition.     Montine Circle, PA-C 10/07/14 1543  Orpah Greek, MD 10/07/14 [phone]

## 2014-10-07 NOTE — ED Provider Notes (Signed)
Patient presented to the ER with vaginal bleeding. Patient has been experiencing progressively worsening vaginal bleeding. She has a history of heavy vaginal bleeding, but was recently started on Xarelto because of a DVT. After starting Xarelto, bleeding has worsened. Patient presents with weakness, dizziness, feeling like she is going to pass out  Face to face Exam: HEENT - PERRLA Lungs - CTAB Heart - RRR, no M/R/G Abd - S/NT/ND Neuro - alert, oriented x3  Plan: Significant symptomatic anemia identified. [facility] transf[facility]   Orpah Greek, MD 10/07/14 1210

## 2014-10-07 NOTE — Telephone Encounter (Signed)
Left message on voicemail Work note placed in front office for pick up

## 2014-10-07 NOTE — ED Notes (Signed)
Pt changed pads and mesh panties. Passed a large vaginal clot when she changed. Pt still feels weak but states she is starting to feel a little better.

## 2014-10-07 NOTE — ED Notes (Signed)
Dr. Betsey Holiday at the bedside.

## 2014-10-08 ENCOUNTER — Observation Stay (HOSPITAL_COMMUNITY): Payer: BC Managed Care – PPO | Admitting: Anesthesiology

## 2014-10-08 ENCOUNTER — Encounter (HOSPITAL_COMMUNITY)
Admission: EM | Disposition: A | Discharge status: Other Acute Inpatient Hospital | Payer: Self-pay | Source: Home / Self Care | Attending: Obstetrics & Gynecology

## 2014-10-08 DIAGNOSIS — D62 Acute posthemorrhagic anemia: Secondary | ICD-10-CM | POA: Diagnosis not present

## 2014-10-08 HISTORY — PX: DILITATION & CURRETTAGE/HYSTROSCOPY WITH HYDROTHERMAL ABLATION: SHX5570

## 2014-10-08 LAB — TYPE AND SCREEN
ABO/RH(D): B POS
ABO/RH(D): B POS
ANTIBODY SCREEN: NEGATIVE
Antibody Screen: NEGATIVE
UNIT DIVISION: 0
UNIT DIVISION: 0
Unit division: 0
Unit division: 0

## 2014-10-08 LAB — MRSA PCR SCREENING: MRSA by PCR: NEGATIVE

## 2014-10-08 LAB — CBC
HCT: 25.4 % — ABNORMAL LOW (ref 36.0–46.0)
HEMOGLOBIN: 8.4 g/dL — AB (ref 12.0–15.0)
MCH: 26.8 pg (ref 26.0–34.0)
MCHC: 33.1 g/dL (ref 30.0–36.0)
MCV: 81.2 fL (ref 78.0–100.0)
Platelets: 186 10*3/uL (ref 150–400)
RBC: 3.13 MIL/uL — ABNORMAL LOW (ref 3.87–5.11)
RDW: 19.8 % — AB (ref 11.5–15.5)
WBC: 8.5 10*3/uL (ref 4.0–10.5)

## 2014-10-08 SURGERY — DILATATION & CURETTAGE/HYSTEROSCOPY WITH HYDROTHERMAL ABLATION
Anesthesia: General

## 2014-10-08 MED ORDER — BUPIVACAINE HCL (PF) 0.5 % IJ SOLN
INTRAMUSCULAR | Status: AC
Start: 2014-10-08 — End: 2014-10-08
  Filled 2014-10-08: qty 30

## 2014-10-08 MED ORDER — OXYCODONE-ACETAMINOPHEN 5-325 MG PO TABS: 1.0000 | ORAL_TABLET | Freq: Four times a day (QID) | ORAL | Status: DC | PRN

## 2014-10-08 MED ORDER — LIDOCAINE HCL (CARDIAC) 20 MG/ML IV SOLN
INTRAVENOUS | Status: AC
  Filled 2014-10-08: qty 5

## 2014-10-08 MED ORDER — MIDAZOLAM HCL 2 MG/2ML IJ SOLN: 0.5000 mg | Freq: Once | INTRAMUSCULAR | Status: DC | PRN

## 2014-10-08 MED ORDER — FENTANYL CITRATE 0.05 MG/ML IJ SOLN
25.0000 ug | INTRAMUSCULAR | Status: DC | PRN
  Administered 2014-10-08: 50 ug via INTRAVENOUS

## 2014-10-08 MED ORDER — DOCUSATE SODIUM 100 MG PO CAPS: 100.0000 mg | ORAL_CAPSULE | Freq: Two times a day (BID) | ORAL | Status: DC | PRN

## 2014-10-08 MED ORDER — MEPERIDINE HCL 25 MG/ML IJ SOLN: 6.2500 mg | INTRAMUSCULAR | Status: DC | PRN

## 2014-10-08 MED ORDER — ONDANSETRON HCL 4 MG/2ML IJ SOLN
INTRAMUSCULAR | Status: DC | PRN
  Administered 2014-10-08: 4 mg via INTRAVENOUS

## 2014-10-08 MED ORDER — MIDAZOLAM HCL 2 MG/2ML IJ SOLN
INTRAMUSCULAR | Status: AC
  Filled 2014-10-08: qty 2

## 2014-10-08 MED ORDER — SCOPOLAMINE 1 MG/3DAYS TD PT72
MEDICATED_PATCH | TRANSDERMAL | Status: DC | PRN
  Administered 2014-10-08: 1 via TRANSDERMAL

## 2014-10-08 MED ORDER — BUPIVACAINE HCL 0.5 % IJ SOLN
INTRAMUSCULAR | Status: DC | PRN
  Administered 2014-10-08: 30 mL

## 2014-10-08 MED ORDER — ACETAMINOPHEN 325 MG PO TABS: 325.0000 mg | ORAL_TABLET | ORAL | Status: DC | PRN

## 2014-10-08 MED ORDER — FENTANYL CITRATE 0.05 MG/ML IJ SOLN
INTRAMUSCULAR | Status: AC
  Filled 2014-10-08: qty 2

## 2014-10-08 MED ORDER — PROPOFOL 10 MG/ML IV BOLUS
INTRAVENOUS | Status: DC | PRN
  Administered 2014-10-08: 200 mg via INTRAVENOUS

## 2014-10-08 MED ORDER — KETOROLAC TROMETHAMINE 30 MG/ML IJ SOLN
INTRAMUSCULAR | Status: AC
  Filled 2014-10-08: qty 1

## 2014-10-08 MED ORDER — MIDAZOLAM HCL 2 MG/2ML IJ SOLN
INTRAMUSCULAR | Status: DC | PRN
  Administered 2014-10-08: 2 mg via INTRAVENOUS

## 2014-10-08 MED ORDER — LIDOCAINE HCL (CARDIAC) 20 MG/ML IV SOLN
INTRAVENOUS | Status: DC | PRN
  Administered 2014-10-08: 60 mg via INTRAVENOUS

## 2014-10-08 MED ORDER — SCOPOLAMINE 1 MG/3DAYS TD PT72
MEDICATED_PATCH | TRANSDERMAL | Status: AC
  Filled 2014-10-08: qty 1

## 2014-10-08 MED ORDER — FENTANYL CITRATE 0.05 MG/ML IJ SOLN
INTRAMUSCULAR | Status: DC | PRN
  Administered 2014-10-08 (×2): 50 ug via INTRAVENOUS

## 2014-10-08 MED ORDER — PROMETHAZINE HCL 25 MG/ML IJ SOLN: 6.2500 mg | INTRAMUSCULAR | Status: DC | PRN

## 2014-10-08 MED ORDER — PROPOFOL 10 MG/ML IV BOLUS
INTRAVENOUS | Status: AC
  Filled 2014-10-08: qty 20

## 2014-10-08 MED ORDER — ONDANSETRON HCL 4 MG/2ML IJ SOLN
INTRAMUSCULAR | Status: AC
  Filled 2014-10-08: qty 2

## 2014-10-08 MED ORDER — FENTANYL CITRATE 0.05 MG/ML IJ SOLN
INTRAMUSCULAR | Status: AC
  Filled 2014-10-08: qty 5

## 2014-10-08 MED ORDER — SODIUM CHLORIDE 0.9 % IR SOLN
Status: DC | PRN
  Administered 2014-10-08: 3000 mL

## 2014-10-08 MED ORDER — DEXAMETHASONE SODIUM PHOSPHATE 4 MG/ML IJ SOLN
INTRAMUSCULAR | Status: AC
Start: 2014-10-08 — End: 2014-10-08
  Filled 2014-10-08: qty 1

## 2014-10-08 MED ORDER — ACETAMINOPHEN 10 MG/ML IV SOLN
1000.0000 mg | Freq: Once | INTRAVENOUS | Status: AC
  Administered 2014-10-08: 1000 mg via INTRAVENOUS
  Filled 2014-10-08: qty 100

## 2014-10-08 MED ORDER — DEXAMETHASONE SODIUM PHOSPHATE 4 MG/ML IJ SOLN
INTRAMUSCULAR | Status: DC | PRN
  Administered 2014-10-08: 4 mg via INTRAVENOUS

## 2014-10-08 MED ORDER — ACETAMINOPHEN 160 MG/5ML PO SOLN: 325.0000 mg | ORAL | Status: DC | PRN

## 2014-10-08 SURGICAL SUPPLY — 13 items
CATH ROBINSON RED A/P 16FR (CATHETERS) ×2 IMPLANT
CLOTH BEACON ORANGE TIMEOUT ST (SAFETY) ×2 IMPLANT
CONTAINER PREFILL 10% NBF 60ML (FORM) ×4 IMPLANT
GLOVE BIOGEL PI IND STRL 7.0 (GLOVE) ×2 IMPLANT
GLOVE BIOGEL PI INDICATOR 7.0 (GLOVE) ×2
GLOVE ECLIPSE 7.0 STRL STRAW (GLOVE) ×2 IMPLANT
GOWN STRL REUS W/TWL LRG LVL3 (GOWN DISPOSABLE) ×4 IMPLANT
NEEDLE SPNL 20GX3.5 QUINCKE YW (NEEDLE) ×2 IMPLANT
PACK VAGINAL MINOR WOMEN LF (CUSTOM PROCEDURE TRAY) ×2 IMPLANT
PAD OB MATERNITY 4.3X12.25 (PERSONAL CARE ITEMS) ×2 IMPLANT
SET GENESYS HTA PROCERVA (MISCELLANEOUS) ×2 IMPLANT
TOWEL OR 17X24 6PK STRL BLUE (TOWEL DISPOSABLE) ×4 IMPLANT
WATER STERILE IRR 1000ML POUR (IV SOLUTION) ×2 IMPLANT

## 2014-10-08 NOTE — Op Note (Signed)
PREOPERATIVE DIAGNOSIS:  Abnormal uterine bleeding POSTOPERATIVE DIAGNOSIS: The same PROCEDURE: Hysteroscopy, Dilation and Curettage,  Hydrothermal Endometrial Ablation SURGEON:  Dr. Verita Schneiders  INDICATIONS: 39 y.o. G1P1 here for scheduled surgery for abnormal uterine bleeding. Risks of surgery were discussed with the patient including but not limited to: bleeding; infection which may require antibiotics; injury to uterus leading to risk of injury to surrounding intraperitoneal organs, burn injury to vagina or other organs, need for additional procedures including laparoscopy or laparotomy, and other postoperative/anesthesia complications.  Patient was informed that there is a high likelihood of success of controlling her symptoms; however about 5% of patients may require further intervention.  Written informed consent was obtained.    FINDINGS:  A 13 week size uterus.  Diffuse proliferative endometrium.  Normal ostia bilaterally.  ANESTHESIA:   General, paracervical block. INTRAVENOUS FLUIDS:  1500 ml of LR ESTIMATED BLOOD LOSS:  20 ml SPECIMENS: Endometrial curettings sent to pathology COMPLICATIONS:  None immediate.  PROCEDURE DETAILS:  The patient was then taken to the operating room where general anesthesia was administered and was found to be adequate.  After an adequate timeout was performed, she was placed in the dorsal lithotomy position and examined; then prepped and draped in the sterile manner.   Her bladder was catheterized for clear, yellow urine. A speculum was then placed in the patient's vagina and a single tooth tenaculum was applied to the anterior lip of the cervix.   A paracervical block using 30 ml of 0.5% Marcaine was administered.  The cervix was sounded to 13 cm and dilated manually with metal dilators to accommodate the hydrothermal ablation hysteroscopic apparatus.  Once the cervix was dilated, gentle curettage was then performed to obtain a moderate amount of  endometrial curettings.  The hysteroscope was inserted under direct visualization using normal saline as a suspension medium.  The uterine cavity was carefully examined and diffusely proliferative endometrium was noted.   The hydrothermal ablation was then carried out as per protocol.   Complete ablation of the endometrium was observed and the hysteroscope was removed under direct visualization.  No complications were observed.  The tenaculum was removed from the anterior lip of the cervix, and the vaginal speculum was removed after noting good hemostasis.  The patient tolerated the procedure well and was taken to the recovery area awake, extubated and in stable condition.  The patient will be discharged to home later today.  Routine postoperative instructions given.  She was prescribed Percocet, Ibuprofen and Colace.  She will follow up in the clinic on 10/26/14  for postoperative evaluation.   Verita Schneiders, MD, [place] Attending [place], [facility]

## 2014-10-08 NOTE — Discharge Instructions (Signed)
Endometrial Ablation Endometrial ablation removes the lining of the uterus (endometrium). It is usually a same-day, outpatient treatment. Ablation helps avoid major surgery, such as surgery to remove the cervix and uterus (hysterectomy). After endometrial ablation, you will have little or no menstrual bleeding and may not be able to have children. However, if you are premenopausal, you will need to use a reliable method of birth control following the procedure because of the small chance that pregnancy can occur. There are different reasons to have this procedure, which include:  Heavy periods.  Bleeding that is causing anemia.  Irregular bleeding.  Bleeding fibroids on the lining inside the uterus if they are smaller than 3 centimeters. This procedure should not be done if:  You want children in the future.  You have severe cramps with your menstrual period.  You have precancerous or cancerous cells in your uterus.  You were recently pregnant.  You have gone through menopause.  You have had major surgery on the uterus, such as a cesarean delivery. LET [facility] CARE PROVIDER KNOW ABOUT:  Any allergies you have.  All medicines you are taking, including vitamins, herbs, eye drops, creams, and over-the-counter medicines.  Previous problems you or members of your family have had with the use of anesthetics.  Any blood disorders you have.  Previous surgeries you have had.  Medical conditions you have. RISKS AND COMPLICATIONS  Generally, this is a safe procedure. However, as with any procedure, complications can occur. Possible complications include:  Perforation of the uterus.  Bleeding.  Infection of the uterus, bladder, or vagina.  Injury to surrounding organs.  An air bubble to the lung (air embolus).  Pregnancy following the procedure.  Failure of the procedure to help the problem, requiring hysterectomy.  Decreased ability to diagnose cancer in the lining of  the uterus. BEFORE THE PROCEDURE  The lining of the uterus must be tested to make sure there is no pre-cancerous or cancer cells present.  An ultrasound may be performed to look at the size of the uterus and to check for abnormalities.  Medicines may be given to thin the lining of the uterus. PROCEDURE  During the procedure, your health care provider will use a tool called a resectoscope to help see inside your uterus. There are different ways to remove the lining of your uterus.   Radiofrequency - This method uses a radiofrequency-alternating electric current to remove the lining of the uterus.  Cryotherapy - This method uses extreme cold to freeze the lining of the uterus.  Heated-Free Liquid - This method uses heated salt (saline) solution to remove the lining of the uterus.  Microwave - This method uses high-energy microwaves to heat up the lining of the uterus to remove it.  Thermal balloon - This method involves inserting a catheter with a balloon tip into the uterus. The balloon tip is filled with heated fluid to remove the lining of the uterus. AFTER THE PROCEDURE  After your procedure, do not have sexual intercourse or insert anything into your vagina until permitted by your health care provider. After the procedure, you may experience:  Cramps.  Vaginal discharge.  Frequent urination. Document Released: 06/23/2004 Document Revised: 04/16/2013 Document Reviewed: 01/15/2013 [facility] Patient Information 2015 [place], [place]. This information is not intended to replace advice given to you by your health care provider. Make sure you discuss any questions you have with your health care provider.

## 2014-10-08 NOTE — Transfer of Care (Signed)
Immediate Anesthesia Transfer of Care Note  Patient: [name]  Procedure(s) Performed: Procedure(s): DILATATION & CURETTAGE/HYSTEROSCOPY WITH HYDROTHERMAL ABLATION (N/A)  Patient Location: PACU  Anesthesia Type:General  Level of Consciousness: awake, alert  and oriented  Airway & Oxygen Therapy: Patient Spontanous Breathing and Patient connected to nasal cannula oxygen  Post-op Assessment: Report given to RN and Post -op Vital signs reviewed and stable  Post vital signs: Reviewed and stable  Last Vitals:  Filed Vitals:   10/08/14 0855  BP: 144/74  Pulse: 112  Temp: 37.1 C  Resp: 18    Complications: No apparent anesthesia complications

## 2014-10-08 NOTE — Anesthesia Postprocedure Evaluation (Signed)
Anesthesia Post Note  Patient: [name]  Procedure(s) Performed: Procedure(s) (LRB): DILATATION & CURETTAGE/HYSTEROSCOPY WITH HYDROTHERMAL ABLATION (N/A)  Anesthesia type: GA  Patient location: PACU  Post pain: Pain level controlled  Post assessment: Post-op Vital signs reviewed  Last Vitals:  Filed Vitals:   10/08/14 1130  BP: 126/86  Pulse: 104  Temp:   Resp: 19    Post vital signs: Reviewed  Level of consciousness: sedated  Complications: No apparent anesthesia complications

## 2014-10-08 NOTE — Progress Notes (Signed)
Pt out in wheel chair  Voided  And ate well   Teaching complete

## 2014-10-08 NOTE — Anesthesia Preprocedure Evaluation (Signed)
Anesthesia Evaluation  Patient identified by MRN, date of birth, ID band Patient awake    Reviewed: Allergy & Precautions, H&P , Patient's Chart, lab work & pertinent test results, reviewed documented beta blocker date and time   History of Anesthesia Complications Negative for: history of anesthetic complications  Airway Mallampati: II  TM Distance: >3 FB Neck ROM: full    Dental   Pulmonary  breath sounds clear to auscultation        Cardiovascular Exercise Tolerance: Good + Peripheral Vascular Disease Rhythm:regular Rate:Normal     Neuro/Psych negative psych ROS   GI/Hepatic   Endo/Other    Renal/GU      Musculoskeletal   Abdominal   Peds  Hematology  (+) anemia ,   Anesthesia Other Findings On xarelto for DVT  Reproductive/Obstetrics                             Anesthesia Physical Anesthesia Plan  ASA: III  Anesthesia Plan: General LMA   Post-op Pain Management:    Induction:   Airway Management Planned:   Additional Equipment:   Intra-op Plan:   Post-operative Plan:   Informed Consent: I have reviewed the patients History and Physical, chart, labs and discussed the procedure including the risks, benefits and alternatives for the proposed anesthesia with the patient or authorized representative who has indicated his/her understanding and acceptance.   Dental Advisory Given  Plan Discussed with: CRNA, Surgeon and Anesthesiologist  Anesthesia Plan Comments:         Anesthesia Quick Evaluation

## 2014-10-08 NOTE — Anesthesia Procedure Notes (Signed)
Procedure Name: LMA Insertion Date/Time: 10/08/2014 10:14 AM Performed by: Elenore Paddy Pre-anesthesia Checklist: Patient identified, Emergency Drugs available, Suction available, Patient being monitored and Timeout performed Patient Re-evaluated:Patient Re-evaluated prior to inductionOxygen Delivery Method: Circle system utilized Preoxygenation: Pre-oxygenation with 100% oxygen Intubation Type: IV induction LMA: LMA inserted LMA Size: 4.0 Number of attempts: 1 Placement Confirmation: positive ETCO2 and breath sounds checked- equal and bilateral Dental Injury: Teeth and Oropharynx as per pre-operative assessment

## 2014-10-08 NOTE — Interval H&P Note (Signed)
History and Physical Interval Note   10/08/2014 9:07 AM  [name]  has presented today for surgery, with the diagnosis of endometrial bleeding. Received a total of 3 units of pRBCs since admission, Hgb now 8.4   The various methods of treatment have been discussed with the patient and husband.   After consideration of risks, benefits and other options for treatment, the patient has consented to DILATATION & CURETTAGE/HYSTEROSCOPY WITH HYDROTHERMAL ABLATION (N/A) as a surgical intervention.  The patient's history has been reviewed, patient examined, no change in status, stable for surgery.  I have reviewed the patient's chart and labs.  Questions were answered to the patient's satisfaction. To OR when ready.    Verita Schneiders, MD, [place] Attending [place], [facility]

## 2014-10-08 NOTE — Discharge Summary (Signed)
Gynecology Physician Discharge Summary  Patient ID: [name] MRN: [id number] DOB/AGE: 40-Dec-1977 40 y.o.  Admit date: 10/07/2014 Discharge date: 10/08/2014  Admission Diagnoses: Abnormal uterine bleeding, fibroids, acute blood loss anemia, recent DVT on Xarelto anticoagulation  Procedures: DILATATION & CURETTAGE/HYSTEROSCOPY WITH HYDROTHERMAL ENDOMETRIAL ABLATION  CBC Latest Ref Rng 10/08/2014 10/07/2014 10/07/2014  WBC 4.0 - 10.5 K/uL 8.5 10.5 -  Hemoglobin 12.0 - 15.0 g/dL 8.4(L) 7.7(L) 6.5(LL)  Hematocrit 36.0 - 46.0 % 25.4(L) 23.5(L) 19.0(L)  Platelets 150 - 400 K/uL 186 232 -    Hospital Course:  [name] is a 39 y.o. G1P1 admitted for symptomatic anemia from abnormal uterine bleeding, in the setting of known fibroids and anticoagulation for recent DVT.  She received 3 units of pRBCs and was scheduled for endometrial ablation.  She underwent an complicated Hydrothermal Endometrial Ablation on 10/08/2014.  For further details about surgery, please refer to the operative report. Patient had an uncomplicated postoperative course and was discharged to home after being observed for few hours after surgery. By time of discharge, her pain was controlled on oral pain medications; she was ambulating, voiding without difficulty and tolerating regular diet and passing flatus. She was deemed stable for discharge to home with outpatient follow up.  Bleeding precautions reviewed.   Patient will follow up with PCP regarding management of her anticoagulation.  Discharge Exam: Blood pressure 122/83, pulse 94, temperature 98.8 F (37.1 C), temperature source Oral, resp. rate 20, height 5' 9"  (1.753 m), weight 215 lb (97.523 kg), last menstrual period 08/23/2014, SpO2 98 %. General appearance: alert and no distress  Resp: clear to auscultation bilaterally  Cardio: RRR  GI: soft, non-tender; bowel sounds normal; no masses, no organomegaly.  Pelvic: scant blood on pad  Extremities:  extremities normal, atraumatic, no cyanosis or edema and Homans sign is negative, no sign of DVT  Discharged Condition: Stable  Disposition: Home     Medication List    TAKE these medications        docusate sodium 100 MG capsule  Commonly known as:  COLACE  Take 1 capsule (100 mg total) by mouth 2 (two) times daily as needed.     ferrous sulfate 325 (65 FE) MG tablet  Take 325 mg by mouth 3 (three) times daily with meals.     oxyCODONE-acetaminophen 5-325 MG per tablet  Commonly known as:  PERCOCET/ROXICET  Take 1-2 tablets by mouth every 6 (six) hours as needed for moderate pain or severe pain.     rivaroxaban 20 MG Tabs tablet  Commonly known as:  XARELTO  Take 1 tablet (20 mg total) by mouth daily with supper.     VITAMIN C PO  Take 1 tablet by mouth daily.       Follow-up Information    Follow up with Osborne Oman, MD On 10/26/2014.   Specialty:  Obstetrics and Gynecology   Why:  1:45 pm for postoperative appointment. Call clinic/come to [facility]U for any concerning issues.   Contact information:   [place] [id number] [phone]       Signed:  Verita Schneiders, MD, [place] Attending [place], [facility]

## 2014-10-10 ENCOUNTER — Encounter (HOSPITAL_COMMUNITY): Payer: Self-pay | Admitting: Obstetrics & Gynecology

## 2014-10-13 ENCOUNTER — Telehealth: Payer: Self-pay | Admitting: General Practice

## 2014-10-13 NOTE — Telephone Encounter (Signed)
-----   Message from Osborne Oman, MD sent at 10/13/2014  9:33 AM EST ----- Benign endometrial pathology from surgery. Please call to inform patient of results.

## 2014-10-13 NOTE — Telephone Encounter (Signed)
Called patient, no answer- left message stating we are trying to reach you with results, nothing urgent but please call us back at the clinics

## 2014-10-13 NOTE — Telephone Encounter (Signed)
Patient called back to front office and I informed her of normal results. Patient verbalized understanding and had no questions

## 2014-10-26 ENCOUNTER — Ambulatory Visit (INDEPENDENT_AMBULATORY_CARE_PROVIDER_SITE_OTHER): Payer: BC Managed Care – PPO | Admitting: Obstetrics & Gynecology

## 2014-10-26 ENCOUNTER — Encounter: Payer: Self-pay | Admitting: Obstetrics & Gynecology

## 2014-10-26 VITALS — BP 124/92 | HR 92 | Wt 220.0 lb

## 2014-10-26 DIAGNOSIS — Z8672 Personal history of thrombophlebitis: Secondary | ICD-10-CM

## 2014-10-26 DIAGNOSIS — Z9889 Other specified postprocedural states: Secondary | ICD-10-CM

## 2014-10-26 DIAGNOSIS — N938 Other specified abnormal uterine and vaginal bleeding: Secondary | ICD-10-CM

## 2014-10-26 NOTE — Patient Instructions (Signed)
Return to clinic for any scheduled appointments or for any gynecologic concerns as needed.

## 2014-10-26 NOTE — Progress Notes (Signed)
   CLINIC ENCOUNTER NOTE  History:  39 y.o. G1P1 here today for follow up after HTA on 10/08/14 in the setting of AUB, acute blood loss anemia while on Xarelto for recent DVT.  HTA done during recent admission during which she received 3 units of pRBCs.  Patient was discharged to home later in the day.  Today, she reports scant discharge. No other problems.  The following portions of the patient's history were reviewed and updated as appropriate: allergies, current medications, past family history, past medical history, past social history, past surgical history and problem list. Normal pap and negative HRHPV on 10/13/13.   Review of Systems:  Pertinent items are noted in HPI.  Objective:  BP 124/92 mmHg  Pulse 92  Wt 220 lb (99.791 kg)  LMP 08/23/2014 Physical Exam deferred  10/08/14 Endometrium, curettage - FRAGMENTS OF BENIGN ENDOMETRIAL TYPE POLYP. - BENIGN ENDOCERVICAL MUCOSA.  Assessment & Plan:  Normal postoperative check after HTA Will observe for 3-4 months to see what her new bleeding pattern would be; she was warned that Xarelto may still amplify any bleeding that she gets Routine preventative health maintenance measures emphasized.   Verita Schneiders, MD, [place] Attending [place] for [employer], [place]

## 2015-05-05 ENCOUNTER — Ambulatory Visit (INDEPENDENT_AMBULATORY_CARE_PROVIDER_SITE_OTHER): Payer: BC Managed Care – PPO | Admitting: Obstetrics & Gynecology

## 2015-05-05 ENCOUNTER — Encounter: Payer: Self-pay | Admitting: Obstetrics & Gynecology

## 2015-05-05 VITALS — BP 125/86 | HR 97 | Resp 20 | Ht 69.0 in | Wt 224.0 lb

## 2015-05-05 DIAGNOSIS — D259 Leiomyoma of uterus, unspecified: Secondary | ICD-10-CM | POA: Diagnosis not present

## 2015-05-05 DIAGNOSIS — N946 Dysmenorrhea, unspecified: Secondary | ICD-10-CM

## 2015-05-05 DIAGNOSIS — Z1231 Encounter for screening mammogram for malignant neoplasm of breast: Secondary | ICD-10-CM

## 2015-05-05 MED ORDER — NAPROXEN 500 MG PO TABS: 500.0000 mg | ORAL_TABLET | Freq: Two times a day (BID) | ORAL | Status: DC

## 2015-05-05 NOTE — Progress Notes (Signed)
CLINIC ENCOUNTER NOTE  History:  39 y.o. G1P1 here today reporting concerning dysmenorrhea during last menstrual period. She underwent HTA in 09/2014 and had normal periods every 21 days with normal flow since then.  In 12/2014 and again in 03/2015; she experienced moderate-severe dysmenorrhea. She reported taking Ibuprofen 800 mg daily which did not help with the pain. She is worried about her fibroids; thinks they have grown in size.  She denies any current abnormal vaginal discharge, bleeding, pelvic pain or other concerns.   Past Medical History  Diagnosis Date  . DVT (deep venous thrombosis)   . Fibroids   . Breast cyst     BILATERAL  . Obesity     Past Surgical History  Procedure Laterality Date  . Myomectomy    . Cesarean section    . Dilitation & currettage/hystroscopy with hydrothermal ablation N/A 10/08/2014    Procedure: DILATATION & CURETTAGE/HYSTEROSCOPY WITH HYDROTHERMAL ABLATION;  Surgeon: Osborne Oman, MD;  Location: [place] [facility];  Service: Gynecology;  Laterality: N/A;    The following portions of the patient's history were reviewed and updated as appropriate: allergies, current medications, past family history, past medical history, past social history, past surgical history and problem list.   Health Maintenance:  Normal pap and negative HRHPV on 10/13/2013.     Review of Systems:  Pertinent items are noted in HPI. Comprehensive review of systems was otherwise negative.  Objective:  Physical Exam BP 125/86 mmHg  Pulse 97  Resp 20  Ht 5' 9"  (1.753 m)  Wt 224 lb (101.606 kg)  BMI 33.06 kg/m2  LMP 04/26/2015 CONSTITUTIONAL: Well-developed, well-nourished female in no acute distress.  HENT:  Normocephalic, atraumatic. External right and left ear normal. Oropharynx is clear and moist EYES: Conjunctivae and EOM are normal. Pupils are equal, round, and reactive to light. No scleral icterus.  NECK: Normal range of motion, supple, no masses SKIN: Skin is warm and  dry. No rash noted. Not diaphoretic. No erythema. No pallor. [place]: Alert and oriented to person, place, and time. Normal reflexes, muscle tone coordination. No cranial nerve deficit noted. PSYCHIATRIC: Normal mood and affect. Normal behavior. Normal judgment and thought content. CARDIOVASCULAR: Normal heart rate noted RESPIRATORY: Effort and breath sounds normal, no problems with respiration noted ABDOMEN: Soft, no distention noted.   PELVIC: Deferred MUSCULOSKELETAL: Normal range of motion. No edema noted.  Labs and Imaging 07/22/2014 TRANSABDOMINAL AND TRANSVAGINAL ULTRASOUND OF PELVIS CLINICAL DATA: Dysfunctional uterine bleeding, status post endometrial biopsy COMPARISON: 06/16/2012 FINDINGS: Uterus Measurements: 13.3 x 6.9 x 8.6 cm. Retroflexed. Two dominant uterine fibroids, as follows:4.9 x 4.4 x 4.0 cm intramural fibroid in the right uterine body; 3.7 x 3.3 x 2.7 cm intramural fibroid in the left uterine body Endometrium Thickness: 13 mm. No focal abnormality visualized. Right ovary Measurements: 3.6 x 2.7 x 2.8 cm. 3.1 x 2.1 x 2.3 cm corpus luteal cyst, simple. Left ovary Measurements: 2.8 x 1.6 x 1.5 cm. Normal appearance/no adnexal mass. Other findings No free fluid. IMPRESSION: Two uterine fibroids measuring up to 4.9 cm.  Endometrial complex measures 13 mm.   3.1 cm right corpus luteal cyst, simple.   Assessment & Plan:  Discussed management modalities for her fibroids and pain; discussed Depo Lupron vs UFE vs myomectomy vs hysterectomy in detail. All questions answered. Patient will think about her options and decide later.  Ultrasound ordered to evaluate her fibroids. Naproxen 500 mg bid prn ordered Follow up after ultrasound Pain precautions reviewed Routine preventative health maintenance measures  emphasized; mammogram ordered Please refer to After Visit Summary for other counseling recommendations.    Total face-to-face time with patient: 25 minutes. Over 50%  of encounter was spent on counseling and coordination of care.   Verita Schneiders, MD, [place] Attending Obstetrician & Gynecologist, [place] for [facility]

## 2015-05-05 NOTE — Patient Instructions (Signed)
Uterine Artery Embolization for Fibroids Uterine artery embolization is a nonsurgical treatment to shrink fibroids. A thin plastic tube (catheter) is used to inject material that blocks off the blood supply to the fibroid, which causes the fibroid to shrink. LET [facility] CARE PROVIDER KNOW ABOUT:  Any allergies you have.  All medicines you are taking, including vitamins, herbs, eye drops, creams, and over-the-counter medicines.  Previous problems you or members of your family have had with the use of anesthetics.  Any blood disorders you have.  Previous surgeries you have had.  Medical conditions you have. RISKS AND COMPLICATIONS  Injury to the uterus from decreased blood supply  Infection.  Blood infection (septicemia).  Lack of menstrual periods (amenorrhea).  Death of tissue cells (necrosis) around your bladder or vulva.  Development of a hole between organs or from an organ to the surface of your skin (fistula).  Blood clot in the legs (deep vein thrombosis) or lung (pulmonary embolus). BEFORE THE PROCEDURE  Ask your health care provider about changing or stopping your regular medicines.   Do not take aspirin or blood thinners (anticoagulants) for 1 week before the surgery or as directed by your health care provider.  Do not eat or drink anything for 8 hours before the surgery or as directed by your health care provider.   Empty your bladder before the procedure begins. PROCEDURE   An IV tube will be placed into one of your veins. This will be used to give you a sedative and pain medication (conscious sedation).  You will be given a medicine that numbs the area (local anesthetic).  A small cut will be made in your groin. A catheter is then inserted into the main artery of your leg.  The catheter will be guided through the artery to your uterus. A series of images will be taken while dye is injected through the catheter in your groin. X-rays are taken at the  same time. This is done to provide a road map of the blood supply to your uterus and fibroids.  Tiny plastic spheres, about the size of sand grains, will be injected through the catheter. Metal coils may be used to help block the artery. The particles will lodge in tiny branches of the uterine artery that supplies blood to the fibroids.  The procedure is repeated on the artery that supplies the other side of the uterus.  The catheter is then removed and pressure is held to stop any bleeding. No stitches are needed.  A dressing is then placed over the cut (incision). AFTER THE PROCEDURE  You will be taken to a recovery area where your progress will be monitored until you are awake, stable, and taking fluids well. If there are no other problems, you will then be moved to a regular hospital room.  You will be observed overnight in the hospital.  You will have cramping that should be controlled with pain medication. Document Released: 10/30/2005 Document Revised: 06/04/2013 Document Reviewed: 02/27/2013 [facility] Patient Information 2015 [place], [place]. This information is not intended to replace advice given to you by your health care provider. Make sure you discuss any questions you have with your health care provider.   Myomectomy Myomectomy is surgery to remove a noncancerous tumor (myoma) from the uterus. Myomas are tumors made up of fibrous tissue. They are often called fibroid tumors. Fibroid tumors can range from the size of a pea to the size of a grapefruit. In a myomectomy, the fibroid tumor is  removed without removing the uterus. Because these tumors are rarely cancerous, this surgery is usually done only if the tumor is growing or causing symptoms such as pain, pressure, bleeding, or pain with intercourse. LET [facility] CARE PROVIDER KNOW ABOUT:  Any allergies you have.  All medicines you are taking, including vitamins, herbs, eye drops, creams, and over-the-counter  medicines.  Previous problems you or members of your family have had with the use of anesthetics.  Any blood disorders you have.  Previous surgeries you have had.  Medical conditions you have. RISKS AND COMPLICATIONS  Generally, this is a safe procedure. However, as with any procedure, complications can occur. Possible complications include:  Excessive bleeding.  Infection.  Injury to nearby organs.  Blood clots in the legs, chest, or brain.  Scar tissue on other organs and in the pelvis. This may require another surgery to remove the scar tissue. BEFORE THE PROCEDURE  Ask your health care provider about changing or stopping your regular medicines. Avoid taking aspirin or blood thinners as directed by your health care provider.  Do not  eat or drink anything after midnight on the night before surgery.  If you smoke, do not  smoke for 2 weeks before the surgery.  Do not  drink alcohol the day before the surgery.  Arrange for someone to drive you home after the procedure or after your hospital stay. Also arrange for someone to help you with activities during your recovery. PROCEDURE You will be given medicine to make you sleep through the procedure (general anesthetic). Any of the following methods may be used to perform a myomectomy:  Small monitors will be put on your body. They are used to check your heart, blood pressure, and oxygen level.  An IV access tube will be put into one of your veins. Medicine will be able to flow directly into your body through this IV tube.  You might be given a medicine to help you relax (sedative).  You will be given a medicine to make you sleep (general anesthetic). A breathing tube will be placed into your lungs during the procedure.  A thin, flexible tube (catheter) will be inserted into your bladder to collect urine.  Any of the following methods may be used to perform a myomectomy:  Hysteroscopic myomectomy--This method may be used  when the fibroid tumor is inside the cavity of the uterus. A long, thin tube that is like a telescope (hysteroscope) is inserted inside the uterus. A saline solution is put into your uterus. This expands the uterus and allows the surgeon to see the fibroids. Tools are passed through the hysteroscope to remove the fibroid tumor in pieces.  Laparoscopic myomectomy--A few small cuts (incisions) are made in the lower abdomen. A thin, lighted tube with a tiny camera on the end (laparoscope) is inserted through one of the incisions. This gives the surgeon a good view of the area. The fibroid tumor is removed through the other incisions. The incisions are then closed with stitches (sutures) or staples.  Abdominal myomectomy--This method is used when the fibroid tumor cannot be removed with a hysteroscope or laparoscope. The surgery is performed through a larger surgical incision in the abdomen. The fibroid tumor is removed through this incision. The incision is closed with sutures or staples. AFTER THE PROCEDURE  If you had a laparoscopic or hysteroscopic myomectomy, you may be able to go home the same day, or you may need to stay in the hospital overnight.  If  you had an abdominal myomectomy, you may need to stay in the hospital for a few days.  Your IV access tube and catheter will be removed in 1-2 days.  You may be given medicine for pain or to help you sleep.  You may be given an antibiotic medicine, if needed. Document Released: 06/11/2007 Document Revised: 06/04/2013 Document Reviewed: 03/26/2013 [facility] Patient Information 2015 [place], [place]. This information is not intended to replace advice given to you by your health care provider. Make sure you discuss any questions you have with your health care provider.  Hysterectomy Information  A hysterectomy is a surgery in which your uterus is removed. This surgery may be done to treat various medical problems. After the surgery, you will no longer  have menstrual periods. The surgery will also make you unable to become pregnant (sterile). The fallopian tubes and ovaries can be removed (bilateral salpingo-oophorectomy) during this surgery as well.  REASONS FOR A HYSTERECTOMY  Persistent, abnormal bleeding.  Lasting (chronic) pelvic pain or infection.  The lining of the uterus (endometrium) starts growing outside the uterus (endometriosis).  The endometrium starts growing in the muscle of the uterus (adenomyosis).  The uterus falls down into the vagina (pelvic organ prolapse).  Noncancerous growths in the uterus (uterine fibroids) that cause symptoms.  Precancerous cells.  Cervical cancer or uterine cancer. TYPES OF HYSTERECTOMIES  Supracervical hysterectomy--In this type, the top part of the uterus is removed, but not the cervix.  Total hysterectomy--The uterus and cervix are removed.  Radical hysterectomy--The uterus, the cervix, and the fibrous tissue that holds the uterus in place in the pelvis (parametrium) are removed. WAYS A HYSTERECTOMY CAN BE PERFORMED  Abdominal hysterectomy--A large surgical cut (incision) is made in the abdomen. The uterus is removed through this incision.  Vaginal hysterectomy--An incision is made in the vagina. The uterus is removed through this incision. There are no abdominal incisions.  Conventional laparoscopic hysterectomy--Three or four small incisions are made in the abdomen. A thin, lighted tube with a camera (laparoscope) is inserted into one of the incisions. Other tools are put through the other incisions. The uterus is cut into small pieces. The small pieces are removed through the incisions, or they are removed through the vagina.  Laparoscopically assisted vaginal hysterectomy (LAVH)--Three or four small incisions are made in the abdomen. Part of the surgery is performed laparoscopically and part vaginally. The uterus is removed through the vagina.  Robot-assisted laparoscopic  hysterectomy--A laparoscope and other tools are inserted into 3 or 4 small incisions in the abdomen. A computer-controlled device is used to give the surgeon a 3D image and to help control the surgical instruments. This allows for more precise movements of surgical instruments. The uterus is cut into small pieces and removed through the incisions or removed through the vagina. RISKS AND COMPLICATIONS  Possible complications associated with this procedure include:  Bleeding and risk of blood transfusion. Tell your health care provider if you do not want to receive any blood products.  Blood clots in the legs or lung.  Infection.  Injury to surrounding organs.  Problems or side effects related to anesthesia.  Conversion to an abdominal hysterectomy from one of the other techniques. WHAT TO EXPECT AFTER A HYSTERECTOMY  You will be given pain medicine.  You will need to have someone with you for the first 3-5 days after you go home.  You will need to follow up with your surgeon in 2-4 weeks after surgery to evaluate your progress.  You may have early menopause symptoms such as hot flashes, night sweats, and insomnia.  If you had a hysterectomy for a problem that was not cancer or not a condition that could lead to cancer, then you no longer need Pap tests. However, even if you no longer need a Pap test, a regular exam is a good idea to make sure no other problems are starting. Document Released: 02/07/2001 Document Revised: 06/04/2013 Document Reviewed: 04/21/2013 [facility] Patient Information 2015 [place], [place]. This information is not intended to replace advice given to you by your health care provider. Make sure you discuss any questions you have with your health care provider.  Uterine Fibroid A uterine fibroid is a growth (tumor) that occurs in your uterus. This type of tumor is not cancerous and does not spread out of the uterus. You can have one or many fibroids. Fibroids can vary  in size, weight, and where they grow in the uterus. Some can become quite large. Most fibroids do not require medical treatment, but some can cause pain or heavy bleeding during and between periods. CAUSES  A fibroid is the result of a single uterine cell that keeps growing (unregulated), which is different than most cells in the human body. Most cells have a control mechanism that keeps them from reproducing without control.  SIGNS AND SYMPTOMS   Bleeding.  Pelvic pain and pressure.  Bladder problems due to the size of the fibroid.  Infertility and miscarriages depending on the size and location of the fibroid. DIAGNOSIS  Uterine fibroids are diagnosed through a physical exam. Your health care provider may feel the lumpy tumors during a pelvic exam. Ultrasonography may be done to get information regarding size, location, and number of tumors.  TREATMENT   Your health care provider may recommend watchful waiting. This involves getting the fibroid checked by your health care provider to see if it grows or shrinks.   Hormone treatment or an intrauterine device (IUD) may be prescribed.   Surgery may be needed to remove the fibroids (myomectomy) or the uterus (hysterectomy). This depends on your situation. When fibroids interfere with fertility and a woman wants to become pregnant, a health care provider may recommend having the fibroids removed.  [place] care depends on how you were treated. In general:   Keep all follow-up appointments with your health care provider.   Only take over-the-counter or prescription medicines as directed by your health care provider. If you were prescribed a hormone treatment, take the hormone medicines exactly as directed. Do not take aspirin. It can cause bleeding.   Talk to your health care provider about taking iron pills.  If your periods are troublesome but not so heavy, lie down with your feet raised slightly above your heart.  Place cold packs on your lower abdomen.   If your periods are heavy, write down the number of pads or tampons you use per month. Bring this information to your health care provider.   Include green vegetables in your diet.  SEEK IMMEDIATE MEDICAL CARE IF:  You have pelvic pain or cramps not controlled with medicines.   You have a sudden increase in pelvic pain.   You have an increase in bleeding between and during periods.   You have excessive periods and soak tampons or pads in a half hour or less.  You feel lightheaded or have fainting episodes. Document Released: 08/11/2000 Document Revised: 06/04/2013 Document Reviewed: 03/13/2013 [facility] Patient Information 2015 [place], [place]. This  information is not intended to replace advice given to you by your health care provider. Make sure you discuss any questions you have with your health care provider.

## 2015-05-05 NOTE — Progress Notes (Signed)
Pt c/o very painful cramping at the beginning of the menstrual cycle in August. C/o watery discharge with foul smelling odor at the end of her cycle, has resolved.

## 2015-07-02 ENCOUNTER — Ambulatory Visit (HOSPITAL_COMMUNITY): Payer: BC Managed Care – PPO

## 2015-07-06 ENCOUNTER — Ambulatory Visit (INDEPENDENT_AMBULATORY_CARE_PROVIDER_SITE_OTHER): Payer: BC Managed Care – PPO | Admitting: Internal Medicine

## 2015-07-06 ENCOUNTER — Encounter: Payer: Self-pay | Admitting: Internal Medicine

## 2015-07-06 VITALS — BP 128/82 | HR 101 | Temp 98.8°F | Wt 210.0 lb

## 2015-07-06 DIAGNOSIS — J029 Acute pharyngitis, unspecified: Secondary | ICD-10-CM

## 2015-07-06 LAB — POCT RAPID STREP A (OFFICE): Rapid Strep A Screen: NEGATIVE

## 2015-07-06 NOTE — Progress Notes (Signed)
   Subjective:    Patient ID: [name], female    DOB: September 17, 1975, 39 y.o.   MRN: [id number]  HPI  [name] is a 39 year old female who presents today with chief compliant of sore throat for 2-3 days.  She has pain especially with swallowing.  She has tried warm salt water rinses with minimal relief. Denies fever, malaise.  No sick contacts.  No post nasal drip, sinus pain.   Review of Systems  Constitutional: Negative for fever, chills and fatigue.  HENT: Positive for sore throat. Negative for congestion, postnasal drip and rhinorrhea.   Respiratory: Negative for cough, shortness of breath and wheezing.   Cardiovascular: Negative for chest pain, palpitations and leg swelling.  Gastrointestinal: Negative for nausea, diarrhea and constipation.  Neurological: Negative for dizziness, light-headedness and headaches.   Family History  Problem Relation Age of Onset  . Diabetes Paternal Grandmother   . Heart disease Neg Hx   . Hypertension Neg Hx   . Cancer Neg Hx     breast or colon cancer   Current Outpatient Prescriptions on File Prior to Visit  Medication Sig Dispense Refill  . rivaroxaban (XARELTO) 20 MG TABS tablet Take 1 tablet (20 mg total) by mouth daily with supper. (Patient not taking: Reported on 07/06/2015) 30 tablet 11   No current facility-administered medications on file prior to visit.       Objective:   Physical Exam  Constitutional: She is oriented to person, place, and time. She appears well-developed and well-nourished.  HENT:  Head: Normocephalic and atraumatic.  Right Ear: External ear normal.  Left Ear: External ear normal.  Unable to properly visualize the back of patient's throat.   Eyes: Pupils are equal, round, and reactive to light.  Neck: Normal range of motion. Neck supple.  Cardiovascular: Normal rate, regular rhythm and normal heart sounds.   Pulmonary/Chest: Effort normal and breath sounds normal.  Musculoskeletal: Normal range of  motion.  Lymphadenopathy:    She has no cervical adenopathy.  Neurological: She is alert and oriented to person, place, and time.  Skin: Skin is warm and dry.     BP 128/82 mmHg  Pulse 101  Temp(Src) 98.8 F (37.1 C) (Oral)  Wt 210 lb (95.255 kg)  SpO2 99%  LMP 07/02/2015      Assessment & Plan:  1. Sore throat Will send for culture. Rapid is negative. Can take ibuprofen 63m tid prn pain.

## 2015-07-06 NOTE — Patient Instructions (Signed)
Sore Throat A sore throat is pain, burning, irritation, or scratchiness of the throat. There is often pain or tenderness when swallowing or talking. A sore throat may be accompanied by other symptoms, such as coughing, sneezing, fever, and swollen neck glands. A sore throat is often the first sign of another sickness, such as a cold, flu, strep throat, or mononucleosis (commonly known as mono). Most sore throats go away without medical treatment. CAUSES  The most common causes of a sore throat include:  A viral infection, such as a cold, flu, or mono.  A bacterial infection, such as strep throat, tonsillitis, or whooping cough.  Seasonal allergies.  Dryness in the air.  Irritants, such as smoke or pollution.  Gastroesophageal reflux disease (GERD). HOME CARE INSTRUCTIONS   Only take over-the-counter medicines as directed by your caregiver.  Drink enough fluids to keep your urine clear or pale yellow.  Rest as needed.  Try using throat sprays, lozenges, or sucking on hard candy to ease any pain (if older than 4 years or as directed).  Sip warm liquids, such as broth, herbal tea, or warm water with honey to relieve pain temporarily. You may also eat or drink cold or frozen liquids such as frozen ice pops.  Gargle with salt water (mix 1 tsp salt with 8 oz of water).  Do not smoke and avoid secondhand smoke.  Put a cool-mist humidifier in your bedroom at night to moisten the air. You can also turn on a hot shower and sit in the bathroom with the door closed for 5-10 minutes. SEEK IMMEDIATE MEDICAL CARE IF:  You have difficulty breathing.  You are unable to swallow fluids, soft foods, or your saliva.  You have increased swelling in the throat.  Your sore throat does not get better in 7 days.  You have nausea and vomiting.  You have a fever or persistent symptoms for more than 2-3 days.  You have a fever and your symptoms suddenly get worse. MAKE SURE YOU:   Understand  these instructions.  Will watch your condition.  Will get help right away if you are not doing well or get worse.   This information is not intended to replace advice given to you by your health care provider. Make sure you discuss any questions you have with your health care provider.   Document Released: 09/21/2004 Document Revised: 09/04/2014 Document Reviewed: 04/21/2012 Elsevier Interactive Patient Education [employer].

## 2015-07-06 NOTE — Progress Notes (Signed)
HPI  Pt presents to the clinic today with c/o nasal congestion and sore throat. This started 3 days ago. She reports that swallowing is painful. She is not blowing anything out of her nose. She denies ear pain or cough. She denies fever, chills or body aches. She has tried Mucinex, salt water gargles and throat lozenges with minimal relief. She has no history of seasonal allergies. She has not had sick contacts that she is aware of.  Review of Systems      Past Medical History  Diagnosis Date  . DVT (deep venous thrombosis) ([place])   . Fibroids   . Breast cyst     BILATERAL  . Obesity     Family History  Problem Relation Age of Onset  . Diabetes Paternal Grandmother   . Heart disease Neg Hx   . Hypertension Neg Hx   . Cancer Neg Hx     breast or colon cancer    Social History   Social History  . Marital Status: Married    Spouse Name: N/A  . Number of Children: 1  . Years of Education: N/A   Occupational History  . 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 Smoking status: Never Smoker   . Smokeless tobacco: Never Used  . Alcohol Use: Yes     Comment: rare  . Drug Use: No  . Sexual Activity:    Partners: Male    Birth Control/ Protection: None   Other Topics Concern  . Not on file   Social History Narrative    No Known Allergies   Constitutional:  Denies headache, fatigue, fever or abrupt weight changes.  HEENT:  Positive nasal congestion, sore throat. Denies eye redness, eye pain, pressure behind the eyes, facial pain, ear pain, ringing in the ears, wax buildup, runny nose or bloody nose. Respiratory:  Denies cough, difficulty breathing or shortness of breath.  Cardiovascular: Denies chest pain, chest tightness, palpitations or swelling in the hands or feet.   No other specific complaints in a complete review of systems (except as listed in HPI above).  Objective:   BP 128/82 mmHg  Pulse 101  Temp(Src) 98.8 F (37.1 C)  (Oral)  Wt 210 lb (95.255 kg)  SpO2 99%  LMP 07/02/2015 Wt Readings from Last 3 Encounters:  07/06/15 210 lb (95.255 kg)  05/05/15 224 lb (101.606 kg)  10/26/14 220 lb (99.791 kg)     General: Appears her stated age, in NAD. HEENT: Head: normal shape and size, no sinus tenderness noted; Eyes: sclera white, no icterus, conjunctiva pink; Ears: Tm's gray and intact, normal light reflex;  Throat/Mouth: unable to visualize back of throat because she could not relax her tongue. Tongue blade kept slipping off her tongue.  Neck: No cervical lymphadenopathy.  Cardiovascular: Tachycardic with normal rhythm. S1,S2 noted.  No murmur, rubs or gallops noted.  Pulmonary/Chest: Normal effort and positive vesicular breath sounds. No respiratory distress. No wheezes, rales or ronchi noted.      Assessment & Plan:   Sore throat:  Get some rest and drink plenty of water Do salt water gargles for the sore throat RSt: negative Will send throat culture Ibuprofen 400-800 mg TID prn for pain and inflammation  RTC as needed or if symptoms persist.

## 2015-07-06 NOTE — Addendum Note (Signed)
Addended by: Lurlean Nanny on: 07/06/2015 04:32 PM   Modules accepted: Orders

## 2015-07-06 NOTE — Progress Notes (Signed)
Pre visit review using our clinic review tool, if applicable. No additional management support is needed unless otherwise documented below in the visit note. 

## 2015-07-07 ENCOUNTER — Ambulatory Visit: Payer: BC Managed Care – PPO | Admitting: Obstetrics & Gynecology

## 2015-07-08 ENCOUNTER — Other Ambulatory Visit: Payer: Self-pay | Admitting: Internal Medicine

## 2015-07-08 LAB — CULTURE, GROUP A STREP

## 2015-07-08 MED ORDER — AMOXICILLIN 875 MG PO TABS: 875.0000 mg | ORAL_TABLET | Freq: Two times a day (BID) | ORAL | Status: DC

## 2015-07-12 ENCOUNTER — Ambulatory Visit
Admission: RE | Admit: 2015-07-12 | Discharge: 2015-07-12 | Disposition: A | Discharge status: Home / Self Care | Payer: BC Managed Care – PPO | Source: Ambulatory Visit | Attending: Obstetrics & Gynecology | Admitting: Obstetrics & Gynecology

## 2015-07-12 DIAGNOSIS — Z1231 Encounter for screening mammogram for malignant neoplasm of breast: Secondary | ICD-10-CM

## 2015-07-15 ENCOUNTER — Other Ambulatory Visit: Payer: Self-pay | Admitting: Obstetrics & Gynecology

## 2015-07-15 DIAGNOSIS — R928 Other abnormal and inconclusive findings on diagnostic imaging of breast: Secondary | ICD-10-CM

## 2015-07-21 ENCOUNTER — Ambulatory Visit (HOSPITAL_COMMUNITY): Payer: BC Managed Care – PPO

## 2015-07-26 ENCOUNTER — Ambulatory Visit
Admission: RE | Admit: 2015-07-26 | Discharge: 2015-07-26 | Disposition: A | Discharge status: Home / Self Care | Payer: BC Managed Care – PPO | Source: Ambulatory Visit | Attending: Obstetrics & Gynecology | Admitting: Obstetrics & Gynecology

## 2015-07-26 DIAGNOSIS — R928 Other abnormal and inconclusive findings on diagnostic imaging of breast: Secondary | ICD-10-CM

## 2015-08-18 ENCOUNTER — Ambulatory Visit (HOSPITAL_COMMUNITY)
Admission: RE | Admit: 2015-08-18 | Discharge: 2015-08-18 | Disposition: A | Discharge status: Home / Self Care | Payer: BC Managed Care – PPO | Source: Ambulatory Visit | Attending: Obstetrics & Gynecology | Admitting: Obstetrics & Gynecology

## 2015-08-18 DIAGNOSIS — N946 Dysmenorrhea, unspecified: Secondary | ICD-10-CM | POA: Diagnosis not present

## 2015-08-18 DIAGNOSIS — D259 Leiomyoma of uterus, unspecified: Secondary | ICD-10-CM | POA: Diagnosis present

## 2015-08-18 DIAGNOSIS — D252 Subserosal leiomyoma of uterus: Secondary | ICD-10-CM | POA: Insufficient documentation

## 2015-08-24 ENCOUNTER — Encounter: Payer: Self-pay | Admitting: Internal Medicine

## 2015-08-24 ENCOUNTER — Ambulatory Visit (INDEPENDENT_AMBULATORY_CARE_PROVIDER_SITE_OTHER): Payer: BC Managed Care – PPO | Admitting: Internal Medicine

## 2015-08-24 VITALS — BP 120/88 | HR 86 | Temp 97.5°F | Wt 218.0 lb

## 2015-08-24 DIAGNOSIS — I82409 Acute embolism and thrombosis of unspecified deep veins of unspecified lower extremity: Secondary | ICD-10-CM | POA: Diagnosis not present

## 2015-08-24 NOTE — Progress Notes (Signed)
   Subjective:    Patient ID: [name], female    DOB: Jan 01, 1976, 39 y.o.   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 rivaroxaban (XARELTO) 20 MG TABS tablet Take 1 tablet (20 mg total) by mouth daily with supper. 30 tablet 11   No current facility-administered medications on file prior to visit.    No Known Allergies  Past Medical History  Diagnosis Date  . DVT (deep venous thrombosis) ([place])   . Fibroids   . Breast cyst     BILATERAL  . Obesity     Past Surgical History  Procedure Laterality Date  . Myomectomy    . Cesarean section    . Dilitation & currettage/hystroscopy with hydrothermal ablation N/A 10/08/2014    Procedure: DILATATION & CURETTAGE/HYSTEROSCOPY WITH HYDROTHERMAL ABLATION;  Surgeon: Osborne Oman, MD;  Location: [facility];  Service: Gynecology;  Laterality: N/A;    Family History  Problem Relation Age of Onset  . Diabetes Paternal Grandmother   . Heart disease Neg Hx   . Hypertension Neg Hx   . Cancer Neg Hx     breast or colon cancer    Social History   Social History  . Marital Status: Married    Spouse Name: N/A  . Number of Children: 1  . Years of Education: N/A   Occupational History  . 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 Smoking status: Never Smoker   . Smokeless tobacco: Never Used  . Alcohol Use: Yes     Comment: rare  . Drug Use: No  . Sexual Activity:    Partners: Male    Birth Control/ Protection:  None   Other Topics Concern  . Not on file   Social History Narrative   Review of Systems  Sleeps well Normal appetite  Weight is up some with holidays     Objective:   Physical Exam  Constitutional: She appears well-developed and well-nourished. No distress.  Neck: Normal range of motion. Neck supple. No thyromegaly present.  Cardiovascular: Normal rate, regular rhythm and normal heart sounds.  Exam reveals no gallop.   No murmur heard. Pulmonary/Chest: Effort normal and breath sounds normal. No respiratory distress. She has no wheezes. She has no rales.  Musculoskeletal: She exhibits no edema or tenderness.  No leg swelling or tenderness  Lymphadenopathy:    She has no cervical adenopathy.          Assessment & Plan:

## 2015-08-24 NOTE — Assessment & Plan Note (Signed)
First 2 were provoked but not this one (only on progesterone) Will set back up with hematology (thinks she saw Dr Jana Hakim in past) Not sure if she had testing for hypercoaguable state ??should she stay on xarelto--or safe to stop

## 2015-08-24 NOTE — Progress Notes (Signed)
Pre visit review using our clinic review tool, if applicable. No additional management support is needed unless otherwise documented below in the visit note. 

## 2015-10-18 ENCOUNTER — Ambulatory Visit (HOSPITAL_BASED_OUTPATIENT_CLINIC_OR_DEPARTMENT_OTHER): Payer: BC Managed Care – PPO

## 2015-10-18 ENCOUNTER — Telehealth: Payer: Self-pay | Admitting: Hematology and Oncology

## 2015-10-18 ENCOUNTER — Ambulatory Visit (HOSPITAL_BASED_OUTPATIENT_CLINIC_OR_DEPARTMENT_OTHER): Payer: BC Managed Care – PPO | Admitting: Hematology and Oncology

## 2015-10-18 ENCOUNTER — Encounter: Payer: Self-pay | Admitting: Hematology and Oncology

## 2015-10-18 DIAGNOSIS — I82401 Acute embolism and thrombosis of unspecified deep veins of right lower extremity: Secondary | ICD-10-CM

## 2015-10-18 DIAGNOSIS — I82409 Acute embolism and thrombosis of unspecified deep veins of unspecified lower extremity: Secondary | ICD-10-CM

## 2015-10-18 LAB — CBC & DIFF AND RETIC
BASO%: 0.6 % (ref 0.0–2.0)
Basophils Absolute: 0 10*3/uL (ref 0.0–0.1)
EOS ABS: 0.2 10*3/uL (ref 0.0–0.5)
EOS%: 4.8 % (ref 0.0–7.0)
HEMATOCRIT: 29.9 % — AB (ref 34.8–46.6)
HEMOGLOBIN: 8.7 g/dL — AB (ref 11.6–15.9)
IMMATURE RETIC FRACT: 22.7 % — AB (ref 1.60–10.00)
LYMPH%: 38 % (ref 14.0–49.7)
MCH: 17.7 pg — AB (ref 25.1–34.0)
MCHC: 29.2 g/dL — ABNORMAL LOW (ref 31.5–36.0)
MCV: 60.7 fL — AB (ref 79.5–101.0)
MONO#: 0.4 10*3/uL (ref 0.1–0.9)
MONO%: 8.9 % (ref 0.0–14.0)
NEUT#: 2.2 10*3/uL (ref 1.5–6.5)
NEUT%: 47.7 % (ref 38.4–76.8)
PLATELETS: 286 10*3/uL (ref 145–400)
RBC: 4.93 10*6/uL (ref 3.70–5.45)
RDW: 17.8 % — AB (ref 11.2–14.5)
Retic %: 1.31 % (ref 0.70–2.10)
Retic Ct Abs: 64.58 10*3/uL (ref 33.70–90.70)
WBC: 4.6 10*3/uL (ref 3.9–10.3)
lymph#: 1.7 10*3/uL (ref 0.9–3.3)

## 2015-10-18 NOTE — Progress Notes (Signed)
Unable to get in to exam room prior to MD.  No assessment performed.  

## 2015-10-18 NOTE — Progress Notes (Signed)
[place] NOTE  Patient Care Team: Venia Carbon, MD as PCP - General  CHIEF COMPLAINTS/PURPOSE OF CONSULTATION:  Recurrent DVTs  HISTORY OF PRESENTING ILLNESS:  [name] 40 y.o. female is here because of recurrent blood clots in the leg. In 2007 patient underwent myomectomy and following that she had a left leg DVT. She was put on Coumadin therapy. In 2008 she was pregnant and hence Coumadin was switched to Lovenox. During pregnancy she remained on Lovenox until a few months after the pregnancy and it was stopped. She did well for quite some time and then 2009 she developed a right leg DVT and went on Lovenox for 6-7 months. There was no predisposing factors for the second blood clot. She did well until February 2015 when she developed left leg DVT and was started on xarelto. She continued on xarelto up until November 2016. She stopped xarelto because of heavy menstrual bleeding. She once again did not have any predisposing factors.  Patient does not have any family history of blood clots. She does not smoke alcohol or does not take oral contraceptive pills either. She states reasonably active and has not been immobile. Previous blood work done in 2008 revealed normal factor V Leiden normal prothrombin gene mutation, normal protein C protein S and antithrombin III. However lupus anticoagulant test was positive at that time. This is however not been reported when she had the subsequent blood clots.  I reviewed her records extensively and collaborated the history with the patient.  MEDICAL HISTORY:  Past Medical History  Diagnosis Date  . DVT (deep venous thrombosis) ([place])   . Fibroids   . Breast cyst     BILATERAL  . Obesity     SURGICAL HISTORY: Past Surgical History  Procedure Laterality Date  . Myomectomy    . Cesarean section    . Dilitation & currettage/hystroscopy with hydrothermal ablation N/A 10/08/2014    Procedure: DILATATION &  CURETTAGE/HYSTEROSCOPY WITH HYDROTHERMAL ABLATION;  Surgeon: Osborne Oman, MD;  Location: [facility];  Service: Gynecology;  Laterality: N/A;    SOCIAL HISTORY: Social History   Social History  . Marital Status: Married    Spouse Name: N/A  . Number of Children: 1  . Years of Education: N/A   Occupational History  . 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 Smoking status: Never Smoker   . Smokeless tobacco: Never Used  . Alcohol Use: Yes     Comment: rare  . Drug Use: No  . Sexual Activity:    Partners: Male    Birth Control/ Protection: None   Other Topics Concern  . Not on file   Social History Narrative    FAMILY HISTORY: Family History  Problem Relation Age of Onset  . Diabetes Paternal Grandmother   . Heart disease Neg Hx   . Hypertension Neg Hx   . Cancer Neg Hx     breast or colon cancer    ALLERGIES:  has No Known Allergies.  MEDICATIONS:  Current Outpatient Prescriptions  Medication Sig Dispense Refill  . rivaroxaban (XARELTO) 20 MG TABS tablet Take 1 tablet (20 mg total) by mouth daily with supper. 30 tablet 11   No current facility-administered medications for this visit.    REVIEW OF SYSTEMS:   Constitutional: Denies fevers, chills or abnormal night sweats Eyes: Denies blurriness of vision, double vision or watery eyes Ears, nose, mouth, throat, and face: Denies  mucositis or sore throat Respiratory: Denies cough, dyspnea or wheezes Cardiovascular: Denies palpitation, chest discomfort or lower extremity swelling Gastrointestinal:  Denies nausea, heartburn or change in bowel habits Skin: Denies abnormal skin rashes Lymphatics: Denies new lymphadenopathy or easy bruising Neurological:Denies numbness, tingling or new weaknesses Behavioral/Psych: Mood is stable, no new changes   All other systems were reviewed with the patient and are negative.  PHYSICAL EXAMINATION: ECOG PERFORMANCE STATUS: 0 -  Asymptomatic  Filed Vitals:   10/18/15 1323  BP: 139/89  Pulse: 94  Temp: 98.2 F (36.8 C)  Resp: 18   Filed Weights   10/18/15 1323  Weight: 218 lb 9.6 oz (99.156 kg)    GENERAL:alert, no distress and comfortable SKIN: skin color, texture, turgor are normal, no rashes or significant lesions EYES: normal, conjunctiva are pink and non-injected, sclera clear OROPHARYNX:no exudate, no erythema and lips, buccal mucosa, and tongue normal  NECK: supple, thyroid normal size, non-tender, without nodularity LYMPH:  no palpable lymphadenopathy in the cervical, axillary or inguinal LUNGS: clear to auscultation and percussion with normal breathing effort HEART: regular rate & rhythm and no murmurs and no lower extremity edema ABDOMEN:abdomen soft, non-tender and normal bowel sounds Musculoskeletal:no cyanosis of digits and no clubbing  PSYCH: alert & oriented x 3 with fluent speech NEURO: no focal motor/sensory deficits LABORATORY DATA:  I have reviewed the data as listed Lab Results  Component Value Date   WBC 4.6 10/18/2015   HGB 8.7* 10/18/2015   HCT 29.9* 10/18/2015   MCV 60.7* 10/18/2015   PLT 286 10/18/2015   Lab Results  Component Value Date   NA 140 10/07/2014   K 3.7 10/07/2014   CL 105 10/07/2014   CO2 20 10/07/2014   ASSESSMENT AND PLAN:  Recurrent deep vein thrombosis (DVT) ([facility]) Recurrent DVT with antiphospholipid antibodies: First episode: 2007 after undergoing myomectomy, got anticoagulation initially with Coumadin and later with Lovenox for about 12 months ( Lovenox was given during her pregnancy in 2008 and discontinued after she delivered) Second episode: 2009 right leg DVT given Lovenox 6-7 months Third episode February 2015: Left leg DVT xarelto until November 2016 stopped for heavy bleeding  Review of blood work from 2008 revealed positive lupus anticoagulant suggestive of antiphospholipid antibody syndrome. However this test has not been  repeated.  Recommendation: 1. Repeat lupus anticoagulant and antiphospholipid antibody testing today 2. I recommend lifelong anticoagulation with xarelto.  I will call the patient when the results of the blood work are available. I recommend that she start taking xarelto back again. We are happy to see her on an as-needed basis.   All questions were answered. The patient knows to call the clinic with any problems, questions or concerns.    Rulon Eisenmenger, MD 10/18/2015

## 2015-10-18 NOTE — Addendum Note (Signed)
Addended by: Prentiss Bells on: 10/18/2015 05:18 PM   Modules accepted: Orders

## 2015-10-18 NOTE — Assessment & Plan Note (Signed)
Recurrent DVT with antiphospholipid antibodies: First episode: 2007 after undergoing myomectomy, got anticoagulation initially with Coumadin and later with Lovenox for about 12 months ( Lovenox was given during her pregnancy in 2008 and discontinued after she delivered) Second episode: 2009 right leg DVT given Lovenox 6-7 months Third episode February 2015: Left leg DVT xarelto until November 2016 stopped for heavy bleeding  Review of blood work from 2008 revealed positive lupus anticoagulant suggestive of antiphospholipid antibody syndrome. However this test has not been repeated.  Recommendation: 1. Repeat lupus anticoagulant and antiphospholipid antibody testing today 2. I recommend lifelong anticoagulation with xarelto.  I will call the patient when the results of the blood work are available. I recommend that she start taking xarelto back again. We are happy to see her on an as-needed basis.

## 2015-10-18 NOTE — Telephone Encounter (Signed)
Patient sent back to lab. No other orders at this time.

## 2015-10-19 ENCOUNTER — Other Ambulatory Visit: Payer: Self-pay | Admitting: Hematology and Oncology

## 2015-10-19 ENCOUNTER — Other Ambulatory Visit (HOSPITAL_BASED_OUTPATIENT_CLINIC_OR_DEPARTMENT_OTHER): Payer: BC Managed Care – PPO

## 2015-10-19 DIAGNOSIS — N920 Excessive and frequent menstruation with regular cycle: Secondary | ICD-10-CM | POA: Diagnosis not present

## 2015-10-19 DIAGNOSIS — I82409 Acute embolism and thrombosis of unspecified deep veins of unspecified lower extremity: Secondary | ICD-10-CM

## 2015-10-19 LAB — IRON AND TIBC
%SAT: 11 % — ABNORMAL LOW (ref 21–57)
Iron: 56 ug/dL (ref 41–142)
TIBC: 494 ug/dL — AB (ref 236–444)
UIBC: 438 ug/dL — AB (ref 120–384)

## 2015-10-19 LAB — FERRITIN: Ferritin: 11 ng/ml (ref 9–269)

## 2015-10-20 LAB — CARDIOLIPIN ANTIBODIES, IGG, IGM, IGA
Anticardiolipin Ab,IgA,Qn: <9 APL U/mL (ref 0–11)
Anticardiolipin Ab,IgM,Qn: 11 MPL U/mL (ref 0–12)

## 2015-10-20 LAB — BETA-2-GLYCOPROTEIN I ABS, IGG/M/A
Beta-2 Glyco 1 IgA: <9 GPI IgA units (ref 0–25)
Beta-2 Glycoprotein I Ab, IgG: <9 GPI IgG units (ref 0–20)

## 2015-10-20 LAB — LUPUS ANTICOAGULANT PANEL
PTT-LA: 38.4 s (ref 0.0–43.6)
dRVVT Mix: 43 s (ref 0.0–44.0)
dRVVT: 46.2 s — ABNORMAL HIGH (ref 0.0–44.0)

## 2015-10-21 ENCOUNTER — Telehealth: Payer: Self-pay | Admitting: Hematology and Oncology

## 2015-10-21 NOTE — Telephone Encounter (Signed)
S/w pt, gave appts for 2/28 + 3/7. Pt asked if there are any side effects she needs to be concerned about after getting the iron infusion. I advised pt, I would have RN cal her to discuss. Lvm for desk nurse.

## 2015-10-22 ENCOUNTER — Telehealth: Payer: Self-pay

## 2015-10-22 NOTE — Telephone Encounter (Signed)
Returned pt call re: feraheme side effects.  I advised pt we do not usually see a lot of side effects.  I let pt know the worst thing we see is allergic reactions and those are rare.  I let pt know to call if she has any other questions.  Pt voiced understanding.

## 2015-10-26 ENCOUNTER — Ambulatory Visit (HOSPITAL_BASED_OUTPATIENT_CLINIC_OR_DEPARTMENT_OTHER): Payer: BC Managed Care – PPO

## 2015-10-26 VITALS — BP 131/81 | HR 110 | Temp 98.8°F

## 2015-10-26 DIAGNOSIS — D62 Acute posthemorrhagic anemia: Secondary | ICD-10-CM | POA: Diagnosis not present

## 2015-10-26 MED ORDER — SODIUM CHLORIDE 0.9 % IV SOLN
510.0000 mg | Freq: Once | INTRAVENOUS | Status: AC
  Administered 2015-10-26: 510 mg via INTRAVENOUS
  Filled 2015-10-26: qty 17

## 2015-10-26 MED ORDER — SODIUM CHLORIDE 0.9 % IV SOLN
Freq: Once | INTRAVENOUS | Status: AC
  Administered 2015-10-26: 15:00:00 via INTRAVENOUS

## 2015-10-26 NOTE — Progress Notes (Signed)
1530 - Feraheme infusing.  Patient has no complaints.  Tolerating well.

## 2015-10-26 NOTE — Patient Instructions (Signed)

## 2015-11-02 ENCOUNTER — Encounter: Payer: Self-pay | Admitting: Internal Medicine

## 2015-11-02 ENCOUNTER — Ambulatory Visit (INDEPENDENT_AMBULATORY_CARE_PROVIDER_SITE_OTHER): Payer: BC Managed Care – PPO | Admitting: Internal Medicine

## 2015-11-02 ENCOUNTER — Ambulatory Visit (HOSPITAL_BASED_OUTPATIENT_CLINIC_OR_DEPARTMENT_OTHER): Payer: BC Managed Care – PPO

## 2015-11-02 VITALS — BP 113/75 | HR 83 | Temp 98.0°F | Resp 20

## 2015-11-02 VITALS — BP 126/82 | HR 78 | Temp 98.7°F | Wt 214.0 lb

## 2015-11-02 DIAGNOSIS — J309 Allergic rhinitis, unspecified: Secondary | ICD-10-CM | POA: Diagnosis not present

## 2015-11-02 DIAGNOSIS — D62 Acute posthemorrhagic anemia: Secondary | ICD-10-CM

## 2015-11-02 MED ORDER — SODIUM CHLORIDE 0.9 % IV SOLN
510.0000 mg | Freq: Once | INTRAVENOUS | Status: AC
  Administered 2015-11-02: 510 mg via INTRAVENOUS
  Filled 2015-11-02: qty 17

## 2015-11-02 MED ORDER — SODIUM CHLORIDE 0.9 % IV SOLN
Freq: Once | INTRAVENOUS | Status: AC
  Administered 2015-11-02: 08:00:00 via INTRAVENOUS

## 2015-11-02 MED ORDER — HYDROCODONE-HOMATROPINE 5-1.5 MG/5ML PO SYRP: 5.0000 mL | ORAL_SOLUTION | Freq: Three times a day (TID) | ORAL | Status: DC | PRN

## 2015-11-02 NOTE — Patient Instructions (Signed)

## 2015-11-02 NOTE — Patient Instructions (Signed)
Allergic Rhinitis Allergic rhinitis is when the mucous membranes in the nose respond to allergens. Allergens are particles in the air that cause your body to have an allergic reaction. This causes you to release allergic antibodies. Through a chain of events, these eventually cause you to release histamine into the blood stream. Although meant to protect the body, it is this release of histamine that causes your discomfort, such as frequent sneezing, congestion, and an itchy, runny nose.  CAUSES Seasonal allergic rhinitis (hay fever) is caused by pollen allergens that may come from grasses, trees, and weeds. Year-round allergic rhinitis (perennial allergic rhinitis) is caused by allergens such as house dust mites, pet dander, and mold spores. SYMPTOMS  Nasal stuffiness (congestion).  Itchy, runny nose with sneezing and tearing of the eyes. DIAGNOSIS Your health care provider can help you determine the allergen or allergens that trigger your symptoms. If you and your health care provider are unable to determine the allergen, skin or blood testing may be used. Your health care provider will diagnose your condition after taking your health history and performing a physical exam. Your health care provider may assess you for other related conditions, such as asthma, pink eye, or an ear infection. TREATMENT Allergic rhinitis does not have a cure, but it can be controlled by:  Medicines that block allergy symptoms. These may include allergy shots, nasal sprays, and oral antihistamines.  Avoiding the allergen. Hay fever may often be treated with antihistamines in pill or nasal spray forms. Antihistamines block the effects of histamine. There are over-the-counter medicines that may help with nasal congestion and swelling around the eyes. Check with your health care provider before taking or giving this medicine. If avoiding the allergen or the medicine prescribed do not work, there are many new medicines  your health care provider can prescribe. Stronger medicine may be used if initial measures are ineffective. Desensitizing injections can be used if medicine and avoidance does not work. Desensitization is when a patient is given ongoing shots until the body becomes less sensitive to the allergen. Make sure you follow up with your health care provider if problems continue. HOME CARE INSTRUCTIONS It is not possible to completely avoid allergens, but you can reduce your symptoms by taking steps to limit your exposure to them. It helps to know exactly what you are allergic to so that you can avoid your specific triggers. SEEK MEDICAL CARE IF:  You have a fever.  You develop a cough that does not stop easily (persistent).  You have shortness of breath.  You start wheezing.  Symptoms interfere with normal daily activities.   This information is not intended to replace advice given to you by your health care provider. Make sure you discuss any questions you have with your health care provider.   Document Released: 05/09/2001 Document Revised: 09/04/2014 Document Reviewed: 04/21/2013 Elsevier Interactive Patient Education [employer].

## 2015-11-02 NOTE — Progress Notes (Signed)
HPI  Pt presents to the clinic today with c/o scratchy throat and cough. This started 1 week ago. She denies difficulty swallowing. The cough productive of yellow mucous. She is having trouble sleeping at night secondary to the cough. She denies runny nose, ear fullness, chest congestion or shortness of breath. She denies fever, chills or body aches. She has tried Mucinex D with some relief. She has no history of seasonal allergies or breathing problems. She has had sick contacts.  Review of Systems      Past Medical History  Diagnosis Date  . DVT (deep venous thrombosis) ([place])   . Fibroids   . Breast cyst     BILATERAL  . Obesity     Family History  Problem Relation Age of Onset  . Diabetes Paternal Grandmother   . Heart disease Neg Hx   . Hypertension Neg Hx   . Cancer Neg Hx     breast or colon cancer    Social History   Social History  . Marital Status: Married    Spouse Name: N/A  . Number of Children: 1  . Years of Education: N/A   Occupational History  . 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 Smoking status: Never Smoker   . Smokeless tobacco: Never Used  . Alcohol Use: Yes     Comment: rare  . Drug Use: No  . Sexual Activity:    Partners: Male    Birth Control/ Protection: None   Other Topics Concern  . Not on file   Social History Narrative    No Known Allergies   Constitutional: Denies headache, fatigue, fever or abrupt weight changes.  HEENT:  Positive sore throat. Denies eye redness, eye pain, pressure behind the eyes, facial pain, nasal congestion, ear pain, ringing in the ears, wax buildup, runny nose or bloody nose. Respiratory: Positive cough. Denies difficulty breathing or shortness of breath.  Cardiovascular: Denies chest pain, chest tightness, palpitations or swelling in the hands or feet.   No other specific complaints in a complete review of systems (except as listed in HPI above).  Objective:   BP  126/82 mmHg  Pulse 78  Temp(Src) 98.7 F (37.1 C) (Oral)  Wt 214 lb (97.07 kg)  SpO2 98%  LMP 10/25/2015 Wt Readings from Last 3 Encounters:  11/02/15 214 lb (97.07 kg)  10/18/15 218 lb 9.6 oz (99.156 kg)  08/24/15 218 lb (98.884 kg)     General: Appears her stated age, well developed, well nourished in NAD. HEENT: Head: normal shape and size, no sinus tenderness noted; Eyes: sclera white, no icterus, conjunctiva pink; Ears: Tm's gray and intact, normal light reflex, cerumen impaction on the left; Nose: mucosa pink and moist, septum midline; Throat/Mouth: + PND. Teeth present, mucosa pink and moist, no exudate noted, no lesions or ulcerations noted.  Neck: No cervical lymphadenopathy.  Cardiovascular: Normal rate and rhythm. S1,S2 noted.  No murmur, rubs or gallops noted.  Pulmonary/Chest: Normal effort and positive vesicular breath sounds. No respiratory distress. No wheezes, rales or ronchi noted.      Assessment & Plan:   Allergic Rhinitis:  Get some rest and drink plenty of water Start Zyrtec and Flonase OTC Rx for Hycodan cough syrup  RTC as needed or if symptoms persist.

## 2015-11-02 NOTE — Progress Notes (Signed)
Pre visit review using our clinic review tool, if applicable. No additional management support is needed unless otherwise documented below in the visit note. 

## 2016-02-22 ENCOUNTER — Encounter: Payer: BC Managed Care – PPO | Admitting: Internal Medicine

## 2016-04-07 ENCOUNTER — Encounter: Payer: BC Managed Care – PPO | Admitting: Internal Medicine

## 2016-04-11 ENCOUNTER — Other Ambulatory Visit: Payer: Self-pay | Admitting: Obstetrics & Gynecology

## 2016-04-11 ENCOUNTER — Encounter: Payer: Self-pay | Admitting: Obstetrics & Gynecology

## 2016-04-11 ENCOUNTER — Ambulatory Visit (INDEPENDENT_AMBULATORY_CARE_PROVIDER_SITE_OTHER): Payer: BC Managed Care – PPO | Admitting: Obstetrics & Gynecology

## 2016-04-11 VITALS — BP 134/88 | HR 98 | Resp 20 | Ht 69.0 in | Wt 232.0 lb

## 2016-04-11 DIAGNOSIS — Z1231 Encounter for screening mammogram for malignant neoplasm of breast: Secondary | ICD-10-CM | POA: Diagnosis not present

## 2016-04-11 DIAGNOSIS — N939 Abnormal uterine and vaginal bleeding, unspecified: Secondary | ICD-10-CM | POA: Diagnosis not present

## 2016-04-11 NOTE — Progress Notes (Signed)
   CLINIC ENCOUNTER NOTE  History:  40 y.o. G1P1 here today for missed period last month followed by a normal period.  Had HTA 09/2014 has light-moderate regular periods since then; known uterine fibroids (< 5 cm).  Has occasional hot flashes and night sweats. She is not pregnant. She denies any abnormal vaginal discharge, other abnormal bleeding, pelvic pain or other concerns.   Past Medical History:  Diagnosis Date  . Breast cyst    BILATERAL  . DVT (deep venous thrombosis) ([place])   . Fibroids   . Obesity     Past Surgical History:  Procedure Laterality Date  . CESAREAN SECTION    . DILITATION & CURRETTAGE/HYSTROSCOPY WITH HYDROTHERMAL ABLATION N/A 10/08/2014   Procedure: DILATATION & CURETTAGE/HYSTEROSCOPY WITH HYDROTHERMAL ABLATION;  Surgeon: Osborne Oman, MD;  Location: [facility];  Service: Gynecology;  Laterality: N/A;  . MYOMECTOMY      The following portions of the patient's history were reviewed and updated as appropriate: allergies, current medications, past family history, past medical history, past social history, past surgical history and problem list.   Health Maintenance:  Normal pap and negative HRHPV on 10/13/2013.  Normal mammogram on 07/26/2015.   Review of Systems:  Pertinent items noted in HPI and remainder of comprehensive ROS otherwise negative.  Objective:  Physical Exam BP 134/88 (BP Location: Left Arm, Patient Position: Sitting, Cuff Size: Normal)   Pulse 98   Resp 20   Ht 5' 9"  (1.753 m)   Wt 232 lb (105.2 kg)   LMP 04/02/2016   BMI 34.26 kg/m  CONSTITUTIONAL: Well-developed, well-nourished female in no acute distress.  HENT:  Normocephalic, atraumatic. External right and left ear normal. Oropharynx is clear and moist EYES: Conjunctivae and EOM are normal. Pupils are equal, round, and reactive to light. No scleral icterus.  NECK: Normal range of motion, supple, no masses SKIN: Skin is warm and dry. No rash noted. Not diaphoretic. No erythema. No  pallor. NEUROLOGIC: Alert and oriented to person, place, and time. Normal reflexes, muscle tone coordination. No cranial nerve deficit noted. PSYCHIATRIC: Normal mood and affect. Normal behavior. Normal judgment and thought content. CARDIOVASCULAR: Normal heart rate noted RESPIRATORY: Effort and breath sounds normal, no problems with respiration noted ABDOMEN: Soft, no distention noted.   PELVIC: Deferred MUSCULOSKELETAL: Normal range of motion. No edema noted.   Assessment & Plan:  1. Abnormal uterine bleeding (AUB) Patient assured that one missed period is not concerning as of now; will continue to monitor bleeding patterns.  Could be secondary to stress, early perimenopausal or several other etiologies. May do further evaluation if this persists. For now, no action needed.   2. Encounter for screening mammogram for breast cancer Due for mammogram in 06/2016. - MM DIGITAL SCREENING BILATERAL; Future  Routine preventative health maintenance measures emphasized. Please refer to After Visit Summary for other counseling recommendations.   Return for any concerning symptoms.   Total face-to-face time with patient: 15 minutes. Over 50% of encounter was spent on counseling and coordination of care.   Verita Schneiders, MD, [place] Attending [place], [facility] [employer], [place]

## 2016-04-11 NOTE — Patient Instructions (Signed)
Return to clinic for any scheduled appointments or for any gynecologic concerns as needed.

## 2016-05-29 ENCOUNTER — Telehealth: Payer: Self-pay | Admitting: Internal Medicine

## 2016-05-29 NOTE — Telephone Encounter (Signed)
Rx sent to pharmacy. Forward to Robin to schedule CPE.

## 2016-05-29 NOTE — Telephone Encounter (Signed)
Approved: okay for a year Please have her set up for yearly physical Should have one within the next 3-4 months

## 2016-05-31 ENCOUNTER — Telehealth: Payer: Self-pay | Admitting: Internal Medicine

## 2016-05-31 NOTE — Telephone Encounter (Signed)
Pt returned your cal please call [phone] Thanks

## 2016-05-31 NOTE — Telephone Encounter (Signed)
Left message asking pt to call office Pt needs to schedule cpx with dr Silvio Pate

## 2016-05-31 NOTE — Telephone Encounter (Signed)
Physical was with gyn--not me. I should really see her once a year also---I don't need to repeat what the gyn does

## 2016-05-31 NOTE — Telephone Encounter (Signed)
Pt just had  cpx  04/07/16

## 2016-05-31 NOTE — Telephone Encounter (Signed)
Left message asking pt to call office  °

## 2016-06-02 ENCOUNTER — Telehealth: Payer: Self-pay | Admitting: Internal Medicine

## 2016-06-02 NOTE — Telephone Encounter (Signed)
Pt called to inquire about an RX discount card for her meds.  She said the pharmacist told her to contact us to see if there was something we could do to help lower the cost of her meds.  She did not say what meds she was calling about.

## 2016-06-02 NOTE — Telephone Encounter (Signed)
09/29/16 Pt aware

## 2016-06-02 NOTE — Telephone Encounter (Signed)
Spoke to pt. I looked online and she can get a Discount card on [email or web address]. She has to go on there and register for it.

## 2016-07-18 ENCOUNTER — Ambulatory Visit: Payer: BC Managed Care – PPO

## 2016-08-07 ENCOUNTER — Ambulatory Visit: Payer: BC Managed Care – PPO

## 2016-08-22 ENCOUNTER — Ambulatory Visit
Admission: RE | Admit: 2016-08-22 | Discharge: 2016-08-22 | Disposition: A | Discharge status: Home / Self Care | Payer: BC Managed Care – PPO | Source: Ambulatory Visit | Attending: Obstetrics & Gynecology | Admitting: Obstetrics & Gynecology

## 2016-08-22 DIAGNOSIS — Z1231 Encounter for screening mammogram for malignant neoplasm of breast: Secondary | ICD-10-CM

## 2016-09-29 ENCOUNTER — Encounter: Payer: BC Managed Care – PPO | Admitting: Internal Medicine

## 2016-11-10 ENCOUNTER — Encounter: Payer: BC Managed Care – PPO | Admitting: Internal Medicine

## 2017-03-06 ENCOUNTER — Telehealth: Payer: Self-pay | Admitting: Radiology

## 2017-03-06 NOTE — Telephone Encounter (Signed)
Called pt to schedule appointment for PAP, patient sent message requesting appointment. Was sent to voicemail and unable to leave message due to mailbox being full.

## 2017-03-08 ENCOUNTER — Encounter: Payer: Self-pay | Admitting: Internal Medicine

## 2017-03-08 ENCOUNTER — Ambulatory Visit (INDEPENDENT_AMBULATORY_CARE_PROVIDER_SITE_OTHER): Payer: BC Managed Care – PPO | Admitting: Internal Medicine

## 2017-03-08 VITALS — BP 116/82 | HR 91 | Temp 97.9°F | Ht 68.75 in | Wt 222.0 lb

## 2017-03-08 DIAGNOSIS — I82409 Acute embolism and thrombosis of unspecified deep veins of unspecified lower extremity: Secondary | ICD-10-CM | POA: Diagnosis not present

## 2017-03-08 DIAGNOSIS — Z0000 Encounter for general adult medical examination without abnormal findings: Secondary | ICD-10-CM | POA: Insufficient documentation

## 2017-03-08 DIAGNOSIS — D649 Anemia, unspecified: Secondary | ICD-10-CM

## 2017-03-08 NOTE — Assessment & Plan Note (Signed)
She needs to continue xarelto--probably indefinitely

## 2017-03-08 NOTE — Patient Instructions (Signed)
DASH Eating Plan DASH stands for "Dietary Approaches to Stop Hypertension." The DASH eating plan is a healthy eating plan that has been shown to reduce high blood pressure (hypertension). It may also reduce your risk for type 2 diabetes, heart disease, and stroke. The DASH eating plan may also help with weight loss. What are tips for following this plan? General guidelines  Avoid eating more than 2,300 mg (milligrams) of salt (sodium) a day. If you have hypertension, you may need to reduce your sodium intake to 1,500 mg a day.  Limit alcohol intake to no more than 1 drink a day for nonpregnant women and 2 drinks a day for men. One drink equals 12 oz of beer, 5 oz of wine, or 1 oz of hard liquor.  Work with your health care provider to maintain a healthy body weight or to lose weight. Ask what an ideal weight is for you.  Get at least 30 minutes of exercise that causes your heart to beat faster (aerobic exercise) most days of the week. Activities may include walking, swimming, or biking.  Work with your health care provider or diet and nutrition specialist (dietitian) to adjust your eating plan to your individual calorie needs. Reading food labels  Check food labels for the amount of sodium per serving. Choose foods with less than 5 percent of the Daily Value of sodium. Generally, foods with less than 300 mg of sodium per serving fit into this eating plan.  To find whole grains, look for the word "whole" as the first word in the ingredient list. Shopping  Buy products labeled as "low-sodium" or "no salt added."  Buy fresh foods. Avoid canned foods and premade or frozen meals. Cooking  Avoid adding salt when cooking. Use salt-free seasonings or herbs instead of table salt or sea salt. Check with your health care provider or pharmacist before using salt substitutes.  Do not fry foods. Cook foods using healthy methods such as baking, boiling, grilling, and broiling instead.  Cook with  heart-healthy oils, such as olive, canola, soybean, or sunflower oil. Meal planning   Eat a balanced diet that includes: ? 5 or more servings of fruits and vegetables each day. At each meal, try to fill half of your plate with fruits and vegetables. ? Up to 6-8 servings of whole grains each day. ? Less than 6 oz of lean meat, poultry, or fish each day. A 3-oz serving of meat is about the same size as a deck of cards. One egg equals 1 oz. ? 2 servings of low-fat dairy each day. ? A serving of nuts, seeds, or beans 5 times each week. ? Heart-healthy fats. Healthy fats called Omega-3 fatty acids are found in foods such as flaxseeds and coldwater fish, like sardines, salmon, and mackerel.  Limit how much you eat of the following: ? Canned or prepackaged foods. ? Food that is high in trans fat, such as fried foods. ? Food that is high in saturated fat, such as fatty meat. ? Sweets, desserts, sugary drinks, and other foods with added sugar. ? Full-fat dairy products.  Do not salt foods before eating.  Try to eat at least 2 vegetarian meals each week.  Eat more home-cooked food and less restaurant, buffet, and fast food.  When eating at a restaurant, ask that your food be prepared with less salt or no salt, if possible. What foods are recommended? The items listed may not be a complete list. Talk with your dietitian about what   dietary choices are best for you. Grains Whole-grain or whole-wheat bread. Whole-grain or whole-wheat pasta. Brown rice. Oatmeal. Quinoa. Bulgur. Whole-grain and low-sodium cereals. Pita bread. Low-fat, low-sodium crackers. Whole-wheat flour tortillas. Vegetables Fresh or frozen vegetables (raw, steamed, roasted, or grilled). Low-sodium or reduced-sodium tomato and vegetable juice. Low-sodium or reduced-sodium tomato sauce and tomato paste. Low-sodium or reduced-sodium canned vegetables. Fruits All fresh, dried, or frozen fruit. Canned fruit in natural juice (without  added sugar). Meat and other protein foods Skinless chicken or turkey. Ground chicken or turkey. Pork with fat trimmed off. Fish and seafood. Egg whites. Dried beans, peas, or lentils. Unsalted nuts, nut butters, and seeds. Unsalted canned beans. Lean cuts of beef with fat trimmed off. Low-sodium, lean deli meat. Dairy Low-fat (1%) or fat-free (skim) milk. Fat-free, low-fat, or reduced-fat cheeses. Nonfat, low-sodium ricotta or cottage cheese. Low-fat or nonfat yogurt. Low-fat, low-sodium cheese. Fats and oils Soft margarine without trans fats. Vegetable oil. Low-fat, reduced-fat, or light mayonnaise and salad dressings (reduced-sodium). Canola, safflower, olive, soybean, and sunflower oils. Avocado. Seasoning and other foods Herbs. Spices. Seasoning mixes without salt. Unsalted popcorn and pretzels. Fat-free sweets. What foods are not recommended? The items listed may not be a complete list. Talk with your dietitian about what dietary choices are best for you. Grains Baked goods made with fat, such as croissants, muffins, or some breads. Dry pasta or rice meal packs. Vegetables Creamed or fried vegetables. Vegetables in a cheese sauce. Regular canned vegetables (not low-sodium or reduced-sodium). Regular canned tomato sauce and paste (not low-sodium or reduced-sodium). Regular tomato and vegetable juice (not low-sodium or reduced-sodium). Pickles. Olives. Fruits Canned fruit in a light or heavy syrup. Fried fruit. Fruit in cream or butter sauce. Meat and other protein foods Fatty cuts of meat. Ribs. Fried meat. Bacon. Sausage. Bologna and other processed lunch meats. Salami. Fatback. Hotdogs. Bratwurst. Salted nuts and seeds. Canned beans with added salt. Canned or smoked fish. Whole eggs or egg yolks. Chicken or turkey with skin. Dairy Whole or 2% milk, cream, and half-and-half. Whole or full-fat cream cheese. Whole-fat or sweetened yogurt. Full-fat cheese. Nondairy creamers. Whipped toppings.  Processed cheese and cheese spreads. Fats and oils Butter. Stick margarine. Lard. Shortening. Ghee. Bacon fat. Tropical oils, such as coconut, palm kernel, or palm oil. Seasoning and other foods Salted popcorn and pretzels. Onion salt, garlic salt, seasoned salt, table salt, and sea salt. Worcestershire sauce. Tartar sauce. Barbecue sauce. Teriyaki sauce. Soy sauce, including reduced-sodium. Steak sauce. Canned and packaged gravies. Fish sauce. Oyster sauce. Cocktail sauce. Horseradish that you find on the shelf. Ketchup. Mustard. Meat flavorings and tenderizers. Bouillon cubes. Hot sauce and Tabasco sauce. Premade or packaged marinades. Premade or packaged taco seasonings. Relishes. Regular salad dressings. Where to find more information:  National Heart, Lung, and Blood Institute: www.nhlbi.nih.gov  American Heart Association: www.heart.org Summary  The DASH eating plan is a healthy eating plan that has been shown to reduce high blood pressure (hypertension). It may also reduce your risk for type 2 diabetes, heart disease, and stroke.  With the DASH eating plan, you should limit salt (sodium) intake to 2,300 mg a day. If you have hypertension, you may need to reduce your sodium intake to 1,500 mg a day.  When on the DASH eating plan, aim to eat more fresh fruits and vegetables, whole grains, lean proteins, low-fat dairy, and heart-healthy fats.  Work with your health care provider or diet and nutrition specialist (dietitian) to adjust your eating plan to your individual   calorie needs. This information is not intended to replace advice given to you by your health care provider. Make sure you discuss any questions you have with your health care provider. Document Released: 08/03/2011 Document Revised: 08/07/2016 Document Reviewed: 08/07/2016 Elsevier Interactive Patient Education  2017 Elsevier Inc.  

## 2017-03-08 NOTE — Assessment & Plan Note (Signed)
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

## 2017-03-08 NOTE — Progress Notes (Signed)
Subjective:    Patient ID: [name], female    DOB: 11/08/1975, 41 y.o.   MRN: [id number]  HPI Here for physical  Doing okay Still working as Animal nutritionist  No change in [place]  Ongoing Rx with xarelto Occasional heavy periods but not bad like before Fairly regular  Current Outpatient Prescriptions on File Prior to Visit  Medication Sig Dispense Refill  . ferrous sulfate 325 (65 FE) MG tablet Take 325 mg by mouth daily with breakfast.    . XARELTO 20 MG TABS tablet TAKE 1 TABLET (20 MG TOTAL) BY MOUTH DAILY WITH SUPPER. 30 tablet 11   No current facility-administered medications on file prior to visit.     No Known Allergies  Past Medical History:  Diagnosis Date  . Breast cyst    BILATERAL  . DVT (deep venous thrombosis) ([place])   . Fibroids   . Obesity     Past Surgical History:  Procedure Laterality Date  . CESAREAN SECTION    . DILITATION & CURRETTAGE/HYSTROSCOPY WITH HYDROTHERMAL ABLATION N/A 10/08/2014   Procedure: DILATATION & CURETTAGE/HYSTEROSCOPY WITH HYDROTHERMAL ABLATION;  Surgeon: Osborne Oman, MD;  Location: [facility];  Service: Gynecology;  Laterality: N/A;  . MYOMECTOMY      Family History  Problem Relation Age of Onset  . Diabetes Paternal Grandmother   . Heart disease Neg Hx   . Hypertension Neg Hx   . Cancer Neg Hx        breast or colon cancer    Social History   Social History  . Marital status: Married    Spouse name: N/A  . Number of children: 1  . Years of education: N/A   Occupational History  . 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 Smoking status: Never Smoker  . Smokeless tobacco: Never Used  . Alcohol use Yes     Comment: rare  . Drug use: No  . Sexual activity: Yes    Partners: Male    Birth control/ protection: None   Other Topics Concern  . Not on file   Social History Narrative  . No narrative on file   Review of Systems  Constitutional:       Has been  exercising Has lost some weight--but mostly eat out or bring in Wears seat belt  HENT: Negative for dental problem, hearing loss and tinnitus.        Overdue for dentist  Eyes: Negative for visual disturbance.       No diplopia or unilateral vision loss  Respiratory: Negative for cough and shortness of breath.        Brief chest tightness  Cardiovascular: Negative for chest pain and palpitations.       Mild edema  Gastrointestinal: Negative for abdominal pain, blood in stool, diarrhea and nausea.  Endocrine: Negative for polydipsia and polyuria.  Genitourinary: Negative for dyspareunia, dysuria and hematuria.  Musculoskeletal: Negative for arthralgias, back pain and joint swelling.  Skin:       Slight facial rash/flaking  Allergic/Immunologic: Positive for environmental allergies. Negative for immunocompromised state.       Mild symptoms  Neurological: Negative for dizziness, syncope, light-headedness and headaches.  Hematological: Negative for adenopathy. Does not bruise/bleed easily.  Psychiatric/Behavioral: Negative for dysphoric mood and sleep disturbance. The patient is not nervous/anxious.        Objective:   Physical Exam  Constitutional: She is oriented to person, place, and time.  She appears well-nourished. No distress.  HENT:  Mouth/Throat: Oropharynx is clear and moist. No oropharyngeal exudate.  Neck: No thyromegaly present.  Cardiovascular: Normal rate, regular rhythm, normal heart sounds and intact distal pulses.  Exam reveals no gallop.   No murmur heard. Pulmonary/Chest: Effort normal and breath sounds normal. No respiratory distress. She has no wheezes. She has no rales.  Abdominal: Soft. There is no tenderness.  Musculoskeletal: She exhibits no edema or tenderness.  Lymphadenopathy:    She has no cervical adenopathy.  Neurological: She is alert and oriented to person, place, and time.  Skin: No rash noted. No erythema.  Psychiatric: She has a normal mood and  affect. Her behavior is normal.          Assessment & Plan:

## 2017-03-09 ENCOUNTER — Telehealth: Payer: Self-pay | Admitting: *Deleted

## 2017-03-09 ENCOUNTER — Other Ambulatory Visit: Payer: Self-pay | Admitting: Internal Medicine

## 2017-03-09 DIAGNOSIS — D649 Anemia, unspecified: Secondary | ICD-10-CM | POA: Insufficient documentation

## 2017-03-09 DIAGNOSIS — D500 Iron deficiency anemia secondary to blood loss (chronic): Secondary | ICD-10-CM

## 2017-03-09 LAB — COMPREHENSIVE METABOLIC PANEL
ALT: 7 U/L (ref 0–35)
AST: 10 U/L (ref 0–37)
Albumin: 3.8 g/dL (ref 3.5–5.2)
Alkaline Phosphatase: 56 U/L (ref 39–117)
BILIRUBIN TOTAL: 0.2 mg/dL (ref 0.2–1.2)
BUN: 9 mg/dL (ref 6–23)
CALCIUM: 9.3 mg/dL (ref 8.4–10.5)
CHLORIDE: 105 meq/L (ref 96–112)
CO2: 27 meq/L (ref 19–32)
Creatinine, Ser: 0.81 mg/dL (ref 0.40–1.20)
GFR: 99.61 mL/min (ref >60.00)
GLUCOSE: 86 mg/dL (ref 70–99)
POTASSIUM: 4.1 meq/L (ref 3.5–5.1)
Sodium: 139 mEq/L (ref 135–145)
Total Protein: 7.2 g/dL (ref 6.0–8.3)

## 2017-03-09 LAB — CBC WITH DIFFERENTIAL/PLATELET
BASOS PCT: 1 % (ref 0.0–3.0)
Basophils Absolute: 0 10*3/uL (ref 0.0–0.1)
EOS PCT: 3 % (ref 0.0–5.0)
Eosinophils Absolute: 0.1 10*3/uL (ref 0.0–0.7)
HEMATOCRIT: 26.4 % — AB (ref 36.0–46.0)
Hemoglobin: 7.8 g/dL — CL (ref 12.0–15.0)
LYMPHS ABS: 1.7 10*3/uL (ref 0.7–4.0)
LYMPHS PCT: 37.8 % (ref 12.0–46.0)
MCHC: 29.4 g/dL — AB (ref 30.0–36.0)
MCV: 62.2 fl — AB (ref 78.0–100.0)
MONOS PCT: 13.7 % — AB (ref 3.0–12.0)
Monocytes Absolute: 0.6 10*3/uL (ref 0.1–1.0)
NEUTROS ABS: 2 10*3/uL (ref 1.4–7.7)
NEUTROS PCT: 44.5 % (ref 43.0–77.0)
PLATELETS: 236 10*3/uL (ref 150.0–400.0)
RBC: 4.25 Mil/uL (ref 3.87–5.11)
RDW: 21.6 % — AB (ref 11.5–15.5)
WBC: 4.4 10*3/uL (ref 4.0–10.5)

## 2017-03-09 LAB — LIPID PANEL
CHOL/HDL RATIO: 6
Cholesterol: 140 mg/dL (ref 0–200)
HDL: 24.2 mg/dL — AB (ref >39.00)
LDL Cholesterol: 91 mg/dL (ref 0–99)
NONHDL: 115.5
Triglycerides: 121 mg/dL (ref 0.0–149.0)
VLDL: 24.2 mg/dL (ref 0.0–40.0)

## 2017-03-09 NOTE — Telephone Encounter (Signed)
I will call her This is not new

## 2017-03-09 NOTE — Assessment & Plan Note (Signed)
Got critical report of low hemoglobin at 7.8. This is only a little lower than in February Likely related to her dysfunctional uterine bleeding but should probably proceed with GI evaluation Will recheck with ferritin, etc She may need to increase iron or get iron infusion I will call her

## 2017-03-09 NOTE — Progress Notes (Signed)
Mailed to pt

## 2017-03-09 NOTE — Telephone Encounter (Signed)
CRITICAL VALUE STICKER  CRITICAL VALUE: HGB 7.8  RECEIVER Daralene Milch, CMA  DATE & TIME NOTIFIED: 03/09/17 @ 11:52  MESSENGER Venetia Constable Lab MD NOTIFIED: Silvio Pate   TIME OF NOTIFICATION: 11:53  RESPONSE:

## 2017-03-09 NOTE — Progress Notes (Signed)
Please mail her the kit

## 2017-03-10 ENCOUNTER — Other Ambulatory Visit: Payer: Self-pay | Admitting: Internal Medicine

## 2017-03-10 DIAGNOSIS — D500 Iron deficiency anemia secondary to blood loss (chronic): Secondary | ICD-10-CM

## 2017-04-04 ENCOUNTER — Other Ambulatory Visit (INDEPENDENT_AMBULATORY_CARE_PROVIDER_SITE_OTHER): Payer: BC Managed Care – PPO

## 2017-04-04 DIAGNOSIS — D500 Iron deficiency anemia secondary to blood loss (chronic): Secondary | ICD-10-CM

## 2017-04-04 LAB — FECAL OCCULT BLOOD, IMMUNOCHEMICAL: Fecal Occult Bld: NEGATIVE

## 2017-04-05 ENCOUNTER — Telehealth: Payer: Self-pay | Admitting: Emergency Medicine

## 2017-04-05 NOTE — Telephone Encounter (Signed)
"  Need to schedule appointment with Dr.Gudena.  Communication problem with referral sent 03-10-2017 by my PCP Dr. Silvio Pate from [place] at [facility].  I need to receive an infusion.  Referral for anemia (IDA) was sent to [facility].  I live in [place].  Calling on my own to set an appointment.  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  Return number [phone]."   Further assessment: Most recent lab on 03-08-2017 HGB = 7.8.   Iron, TIBC and Ferritin checked last on 10-19-2015 per EPIC labs.   "I've not received blood transfusion in one to two years. (10-07-2014 per EPIC) No chest pain or shortness of breath.    I have been tired in the past.  I decided I would take over the counter iron from time to time.  Only as needed when I feel tired because I'm on Xarelto.  Dr. Silvio Pate thought iron by mouth would  not help me at all.  Last iron pill was three days ago.  I feel sluggish having good and bad days but I manage.   I have severe anemia, heavy menstrual cycles, My fecal blood test yesterday was negative.   After the first day of school on August 27-2018 it becomes difficult to get away from work but I can try for my health."   Last [facility] visit was on 10-18-2015 with F/U as needed.  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  Received Feraheme at [facility] 10-26-2015 and 11-02-2015.  Routing message, call information to collaborative nurse and provider for review with any further patient communication through collaborative nurse.

## 2017-04-05 NOTE — Telephone Encounter (Signed)
Pt left a VM on Nurse May, RN phone this am. This nurse returned her call which went to her VM. It was not indicated what patient was calling about. Asked her to please give [place] a call back

## 2017-04-06 ENCOUNTER — Telehealth: Payer: Self-pay | Admitting: Hematology and Oncology

## 2017-04-06 ENCOUNTER — Other Ambulatory Visit: Payer: Self-pay

## 2017-04-06 ENCOUNTER — Telehealth: Payer: Self-pay

## 2017-04-06 DIAGNOSIS — D500 Iron deficiency anemia secondary to blood loss (chronic): Secondary | ICD-10-CM

## 2017-04-06 NOTE — Telephone Encounter (Signed)
Will call and follow up with pt.

## 2017-04-06 NOTE — Telephone Encounter (Signed)
Called pt LVM to let her know that her msg was received by Dr.Gudena and we will schedule a time for her to come in to check iron levels and to determine if she will need iron infusion. Told pt Dr.Gudena will be out of the office all week next week, but we will get a hold of pt to let her know if she will need infusion, based on lab work and symptoms. Call back number provided.

## 2017-04-06 NOTE — Telephone Encounter (Signed)
lvm to inform pt of 8/16 and 8/23 appt per sch msg

## 2017-04-09 ENCOUNTER — Encounter: Payer: Self-pay | Admitting: Hematology and Oncology

## 2017-04-10 ENCOUNTER — Telehealth: Payer: Self-pay | Admitting: Hematology and Oncology

## 2017-04-10 NOTE — Telephone Encounter (Signed)
Left voicemail for patient regarding her change in appointment time on 8/23.

## 2017-04-12 ENCOUNTER — Other Ambulatory Visit (HOSPITAL_BASED_OUTPATIENT_CLINIC_OR_DEPARTMENT_OTHER): Payer: BC Managed Care – PPO

## 2017-04-12 DIAGNOSIS — D500 Iron deficiency anemia secondary to blood loss (chronic): Secondary | ICD-10-CM

## 2017-04-12 DIAGNOSIS — D62 Acute posthemorrhagic anemia: Secondary | ICD-10-CM | POA: Diagnosis not present

## 2017-04-12 LAB — CBC WITH DIFFERENTIAL/PLATELET
BASO%: 0.2 % (ref 0.0–2.0)
BASOS ABS: 0 10*3/uL (ref 0.0–0.1)
EOS%: 3 % (ref 0.0–7.0)
Eosinophils Absolute: 0.1 10*3/uL (ref 0.0–0.5)
HEMATOCRIT: 27 % — AB (ref 34.8–46.6)
HGB: 7.2 g/dL — ABNORMAL LOW (ref 11.6–15.9)
LYMPH#: 1.6 10*3/uL (ref 0.9–3.3)
LYMPH%: 37.1 % (ref 14.0–49.7)
MCH: 16.9 pg — AB (ref 25.1–34.0)
MCHC: 26.7 g/dL — AB (ref 31.5–36.0)
MCV: 63.5 fL — AB (ref 79.5–101.0)
MONO#: 0.5 10*3/uL (ref 0.1–0.9)
MONO%: 11.4 % (ref 0.0–14.0)
NEUT#: 2.1 10*3/uL (ref 1.5–6.5)
NEUT%: 48.3 % (ref 38.4–76.8)
PLATELETS: 194 10*3/uL (ref 145–400)
RBC: 4.25 10*6/uL (ref 3.70–5.45)
RDW: 18.9 % — ABNORMAL HIGH (ref 11.2–14.5)
WBC: 4.3 10*3/uL (ref 3.9–10.3)

## 2017-04-12 LAB — FERRITIN: Ferritin: 4 ng/ml — ABNORMAL LOW (ref 9–269)

## 2017-04-12 LAB — IRON AND TIBC
%SAT: 3 % — AB (ref 21–57)
IRON: 17 ug/dL — AB (ref 41–142)
TIBC: 478 ug/dL — ABNORMAL HIGH (ref 236–444)
UIBC: 461 ug/dL — AB (ref 120–384)

## 2017-04-17 ENCOUNTER — Other Ambulatory Visit: Payer: Self-pay | Admitting: Hematology and Oncology

## 2017-04-19 ENCOUNTER — Ambulatory Visit: Payer: BC Managed Care – PPO

## 2017-04-19 ENCOUNTER — Ambulatory Visit: Payer: BC Managed Care – PPO | Admitting: Hematology and Oncology

## 2017-04-19 ENCOUNTER — Ambulatory Visit (HOSPITAL_BASED_OUTPATIENT_CLINIC_OR_DEPARTMENT_OTHER): Payer: BC Managed Care – PPO | Admitting: Hematology and Oncology

## 2017-04-19 ENCOUNTER — Encounter: Payer: Self-pay | Admitting: Hematology and Oncology

## 2017-04-19 ENCOUNTER — Ambulatory Visit (HOSPITAL_BASED_OUTPATIENT_CLINIC_OR_DEPARTMENT_OTHER): Payer: BC Managed Care – PPO

## 2017-04-19 VITALS — BP 130/81 | HR 80 | Temp 98.4°F | Resp 17

## 2017-04-19 DIAGNOSIS — N920 Excessive and frequent menstruation with regular cycle: Secondary | ICD-10-CM

## 2017-04-19 DIAGNOSIS — D500 Iron deficiency anemia secondary to blood loss (chronic): Secondary | ICD-10-CM | POA: Diagnosis not present

## 2017-04-19 DIAGNOSIS — D62 Acute posthemorrhagic anemia: Secondary | ICD-10-CM

## 2017-04-19 DIAGNOSIS — I82409 Acute embolism and thrombosis of unspecified deep veins of unspecified lower extremity: Secondary | ICD-10-CM | POA: Diagnosis not present

## 2017-04-19 MED ORDER — SODIUM CHLORIDE 0.9 % IV SOLN
Freq: Once | INTRAVENOUS | Status: AC
  Administered 2017-04-19: 10:00:00 via INTRAVENOUS

## 2017-04-19 MED ORDER — SODIUM CHLORIDE 0.9 % IV SOLN
510.0000 mg | Freq: Once | INTRAVENOUS | Status: AC
  Administered 2017-04-19: 510 mg via INTRAVENOUS
  Filled 2017-04-19: qty 17

## 2017-04-19 NOTE — Progress Notes (Signed)
Patient Care Team: Venia Carbon, MD as PCP - General  DIAGNOSIS:  Encounter Diagnoses  Name Primary?  . Recurrent deep vein thrombosis (DVT) ([place])   . Acute blood loss anemia     CHIEF COMPLIANT:  Follow-up of severe iron deficiency anemia due to blood loss  INTERVAL HISTORY: [name] is a  41 year old with above-mentioned history of recurrent DVTs on anticoagulation. She is here for iron deficiency anemia due to severe blood loss. She has heavy menstrual bleeding lasting 10-12 days. She had previous thermal ablation but continues to have heavy cycles.  She complains of fatigue and shortness of breath to minimal exertion.  REVIEW OF SYSTEMS:   Constitutional: Denies fevers, chills or abnormal weight loss Eyes: Denies blurriness of vision Ears, nose, mouth, throat, and face: Denies mucositis or sore throat Respiratory: Denies cough, dyspnea or wheezes Cardiovascular: Denies palpitation, chest discomfort Gastrointestinal:  Denies nausea, heartburn or change in bowel habits Skin: Denies abnormal skin rashes Lymphatics: Denies new lymphadenopathy or easy bruising Neurological:Denies numbness, tingling or new weaknesses Behavioral/Psych: Mood is stable, no new changes  Extremities: No lower extremity edema All other systems were reviewed with the patient and are negative.  I have reviewed the past medical history, past surgical history, social history and family history with the patient and they are unchanged from previous note.  ALLERGIES:  has No Known Allergies.  MEDICATIONS:  Current Outpatient Prescriptions  Medication Sig Dispense Refill  . ferrous sulfate 325 (65 FE) MG tablet Take 325 mg by mouth daily with breakfast.    . XARELTO 20 MG TABS tablet TAKE 1 TABLET (20 MG TOTAL) BY MOUTH DAILY WITH SUPPER. 30 tablet 11   No current facility-administered medications for this visit.     PHYSICAL EXAMINATION: ECOG PERFORMANCE STATUS: 1 - Symptomatic but  completely ambulatory  Vitals:   04/19/17 0923  BP: 129/76  Pulse: 96  Resp: 18  Temp: 98.3 F (36.8 C)  SpO2: 100%   Filed Weights   04/19/17 0923  Weight: 219 lb 8 oz (99.6 kg)    GENERAL:alert, no distress and comfortable SKIN: skin color, texture, turgor are normal, no rashes or significant lesions EYES: normal, Conjunctiva are pink and non-injected, sclera clear OROPHARYNX:no exudate, no erythema and lips, buccal mucosa, and tongue normal  NECK: supple, thyroid normal size, non-tender, without nodularity LYMPH:  no palpable lymphadenopathy in the cervical, axillary or inguinal LUNGS: clear to auscultation and percussion with normal breathing effort HEART: regular rate & rhythm and no murmurs and no lower extremity edema ABDOMEN:abdomen soft, non-tender and normal bowel sounds MUSCULOSKELETAL:no cyanosis of digits and no clubbing  NEURO: alert & oriented x 3 with fluent speech, no focal motor/sensory deficits EXTREMITIES: No lower extremity edema  LABORATORY DATA:  I have reviewed the data as listed   Chemistry      Component Value Date/Time   NA 139 03/08/2017 1602   K 4.1 03/08/2017 1602   CL 105 03/08/2017 1602   CO2 27 03/08/2017 1602   BUN 9 03/08/2017 1602   CREATININE 0.81 03/08/2017 1602      Component Value Date/Time   CALCIUM 9.3 03/08/2017 1602   ALKPHOS 56 03/08/2017 1602   AST 10 03/08/2017 1602   ALT 7 03/08/2017 1602   BILITOT 0.2 03/08/2017 1602       Lab Results  Component Value Date   WBC 4.3 04/12/2017   HGB 7.2 (L) 04/12/2017   HCT 27.0 (L) 04/12/2017   MCV 63.5 (  L) 04/12/2017   PLT 194 04/12/2017   NEUTROABS 2.1 04/12/2017    ASSESSMENT & PLAN:  Recurrent deep vein thrombosis (DVT) ([place]) Recurrent DVT with antiphospholipid antibodies: First episode: 2007 after undergoing myomectomy, got anticoagulation initially with Coumadin and later with Lovenox for about 12 months ( Lovenox was given during her pregnancy in 2008 and  discontinued after she delivered) Second episode: 2009 right leg DVT given Lovenox 6-7 months Third episode February 2015: Left leg DVT xarelto until November 2016 stopped for heavy bleeding --------------------------------------------------------------- Repeat testing for lupus anticoagulant in February 2017 was negative. In spite of this. Recommended lifelong anticoagulation based on recurrent blood clots.  Acute blood loss anemia Severe iron deficiency anemia: Lab review: Hemoglobin 7.2, MCV 63.5, RDW 18.9, iron saturation 3%, ferritin 4 Suggestive of severe iron deficiency anemia We will administer 2 doses of Feraheme.  Recheck labs in 3 months and follow-up afterwards   I spent 25 minutes talking to the patient of which more than half was spent in counseling and coordination of care.  No orders of the defined types were placed in this encounter.  The patient has a good understanding of the overall plan. she agrees with it. she will call with any problems that may develop before the next visit here.   Rulon Eisenmenger, MD 04/19/17

## 2017-04-19 NOTE — Assessment & Plan Note (Signed)
Recurrent DVT with antiphospholipid antibodies: First episode: 2007 after undergoing myomectomy, got anticoagulation initially with Coumadin and later with Lovenox for about 12 months ( Lovenox was given during her pregnancy in 2008 and discontinued after she delivered) Second episode: 2009 right leg DVT given Lovenox 6-7 months Third episode February 2015: Left leg DVT xarelto until November 2016 stopped for heavy bleeding --------------------------------------------------------------- Repeat testing for lupus anticoagulant in February 2017 was negative. In spite of this. Recommended lifelong anticoagulation based on recurrent blood clots.

## 2017-04-19 NOTE — Progress Notes (Signed)
Pt refused to stay for 30 minute post-observation. Pt had to get to work.VSS and pt stable at discharge.

## 2017-04-19 NOTE — Assessment & Plan Note (Signed)
Severe iron deficiency anemia: Lab review: Hemoglobin 7.2, MCV 63.5, RDW 18.9, iron saturation 3%, ferritin 4 Suggestive of severe iron deficiency anemia We will administer 2 doses of Feraheme.  Recheck labs in 3 months and follow-up afterwards

## 2017-04-19 NOTE — Patient Instructions (Signed)

## 2017-04-26 ENCOUNTER — Ambulatory Visit (HOSPITAL_BASED_OUTPATIENT_CLINIC_OR_DEPARTMENT_OTHER): Payer: BC Managed Care – PPO

## 2017-04-26 VITALS — BP 126/84 | HR 84 | Temp 98.3°F | Resp 17

## 2017-04-26 DIAGNOSIS — D500 Iron deficiency anemia secondary to blood loss (chronic): Secondary | ICD-10-CM | POA: Diagnosis not present

## 2017-04-26 DIAGNOSIS — N920 Excessive and frequent menstruation with regular cycle: Secondary | ICD-10-CM | POA: Diagnosis not present

## 2017-04-26 DIAGNOSIS — D62 Acute posthemorrhagic anemia: Secondary | ICD-10-CM

## 2017-04-26 MED ORDER — SODIUM CHLORIDE 0.9 % IV SOLN
Freq: Once | INTRAVENOUS | Status: AC
  Administered 2017-04-26: 08:00:00 via INTRAVENOUS

## 2017-04-26 MED ORDER — SODIUM CHLORIDE 0.9 % IV SOLN
510.0000 mg | Freq: Once | INTRAVENOUS | Status: AC
  Administered 2017-04-26: 510 mg via INTRAVENOUS
  Filled 2017-04-26: qty 17

## 2017-04-26 NOTE — Patient Instructions (Signed)

## 2017-04-27 ENCOUNTER — Telehealth: Payer: Self-pay

## 2017-04-27 NOTE — Telephone Encounter (Signed)
Pt called to report a bruise to her arm where she received iron yesterday morning. The iv assessment upon removal was "clean,dry, intact".  lvm to keep it elevated, put warm compresses on it. Go to ER or urgent care if it appears infected. Most likely some bleeding under the skin where they took the needle out.

## 2017-07-17 ENCOUNTER — Ambulatory Visit: Payer: BC Managed Care – PPO | Admitting: Obstetrics & Gynecology

## 2017-07-17 ENCOUNTER — Encounter: Payer: Self-pay | Admitting: Radiology

## 2017-07-17 ENCOUNTER — Encounter: Payer: Self-pay | Admitting: Obstetrics & Gynecology

## 2017-07-17 ENCOUNTER — Other Ambulatory Visit (HOSPITAL_BASED_OUTPATIENT_CLINIC_OR_DEPARTMENT_OTHER): Payer: BC Managed Care – PPO

## 2017-07-17 ENCOUNTER — Ambulatory Visit (INDEPENDENT_AMBULATORY_CARE_PROVIDER_SITE_OTHER): Payer: BC Managed Care – PPO | Admitting: Obstetrics & Gynecology

## 2017-07-17 VITALS — BP 144/88 | HR 98 | Ht 69.0 in | Wt 225.0 lb

## 2017-07-17 DIAGNOSIS — Z7901 Long term (current) use of anticoagulants: Secondary | ICD-10-CM

## 2017-07-17 DIAGNOSIS — Z01419 Encounter for gynecological examination (general) (routine) without abnormal findings: Secondary | ICD-10-CM

## 2017-07-17 DIAGNOSIS — Z113 Encounter for screening for infections with a predominantly sexual mode of transmission: Secondary | ICD-10-CM

## 2017-07-17 DIAGNOSIS — D62 Acute posthemorrhagic anemia: Secondary | ICD-10-CM

## 2017-07-17 DIAGNOSIS — D219 Benign neoplasm of connective and other soft tissue, unspecified: Secondary | ICD-10-CM

## 2017-07-17 DIAGNOSIS — D500 Iron deficiency anemia secondary to blood loss (chronic): Secondary | ICD-10-CM | POA: Diagnosis not present

## 2017-07-17 DIAGNOSIS — Z1231 Encounter for screening mammogram for malignant neoplasm of breast: Secondary | ICD-10-CM

## 2017-07-17 DIAGNOSIS — N939 Abnormal uterine and vaginal bleeding, unspecified: Secondary | ICD-10-CM

## 2017-07-17 DIAGNOSIS — Z124 Encounter for screening for malignant neoplasm of cervix: Secondary | ICD-10-CM

## 2017-07-17 DIAGNOSIS — Z1151 Encounter for screening for human papillomavirus (HPV): Secondary | ICD-10-CM | POA: Diagnosis not present

## 2017-07-17 LAB — CBC WITH DIFFERENTIAL/PLATELET
BASO%: 0.4 % (ref 0.0–2.0)
BASOS ABS: 0 10*3/uL (ref 0.0–0.1)
EOS%: 2 % (ref 0.0–7.0)
Eosinophils Absolute: 0.1 10*3/uL (ref 0.0–0.5)
HEMATOCRIT: 30.3 % — AB (ref 34.8–46.6)
HEMOGLOBIN: 9.3 g/dL — AB (ref 11.6–15.9)
LYMPH#: 2 10*3/uL (ref 0.9–3.3)
LYMPH%: 40.4 % (ref 14.0–49.7)
MCH: 21.7 pg — AB (ref 25.1–34.0)
MCHC: 30.9 g/dL — AB (ref 31.5–36.0)
MCV: 70.4 fL — ABNORMAL LOW (ref 79.5–101.0)
MONO#: 0.5 10*3/uL (ref 0.1–0.9)
MONO%: 9 % (ref 0.0–14.0)
NEUT#: 2.4 10*3/uL (ref 1.5–6.5)
NEUT%: 48.2 % (ref 38.4–76.8)
PLATELETS: 256 10*3/uL (ref 145–400)
RBC: 4.3 10*6/uL (ref 3.70–5.45)
RDW: 20.9 % — ABNORMAL HIGH (ref 11.2–14.5)
WBC: 5 10*3/uL (ref 3.9–10.3)

## 2017-07-17 MED ORDER — MEGESTROL ACETATE 40 MG PO TABS: 40.0000 mg | ORAL_TABLET | Freq: Every day | ORAL | 5 refills | Status: DC

## 2017-07-17 NOTE — Progress Notes (Signed)
GYNECOLOGY ANNUAL PREVENTATIVE CARE ENCOUNTER NOTE  Subjective:   [name] is a 41 y.o. G59P0001 female here for a routine annual gynecologic exam.  Current complaints: increased length of periods.  Periods now last 10-11 days for the past 3 months; used to be 7-8 days.  Slightly increased volume. No cramping. History of fibroids, also on Xarelto for long term anticoagulation.   Denies abnormal vaginal discharge, pelvic pain, problems with intercourse or other gynecologic concerns.    Gynecologic History No LMP recorded. Contraception: none Last Pap: 2015. Results were: normal with negative HPV Last mammogram: 08/22/2016. Results were: normal  Obstetric History OB History  Gravida Para Term Preterm AB Living  1 1 0 0 0 1  SAB TAB Ectopic Multiple Live Births  0 0 0 0 1    # Outcome Date GA Lbr Len/2nd Weight Sex Delivery Anes PTL Lv  1 Para 03/28/07    F CS-Classical   LIV      Past Medical History:  Diagnosis Date  . Breast cyst    BILATERAL  . DVT (deep venous thrombosis) ([place])   . Fibroids   . Obesity     Past Surgical History:  Procedure Laterality Date  . CESAREAN SECTION    . DILITATION & CURRETTAGE/HYSTROSCOPY WITH HYDROTHERMAL ABLATION N/A 10/08/2014   Procedure: DILATATION & CURETTAGE/HYSTEROSCOPY WITH HYDROTHERMAL ABLATION;  Surgeon: Osborne Oman, MD;  Location: [facility];  Service: Gynecology;  Laterality: N/A;  . MYOMECTOMY      Current Outpatient Medications on File Prior to Visit  Medication Sig Dispense Refill  . XARELTO 20 MG TABS tablet TAKE 1 TABLET (20 MG TOTAL) BY MOUTH DAILY WITH SUPPER. 30 tablet 11   No current facility-administered medications on file prior to visit.     No Known Allergies  Social History   Socioeconomic History  . Marital status: Married    Spouse name: Not on file  . Number of children: 1  . Years of education: Not on file  . Highest education level: Not on file  Social Needs  . Financial resource  strain: Not on file  . Food insecurity - worry: Not on file  . Food insecurity - inability: Not on file  . Transportation needs - medical: Not on file  . Transportation needs - non-medical: Not on file  Occupational History  . 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 Smoking status: Never Smoker  . Smokeless tobacco: Never Used  Substance and Sexual Activity  . Alcohol use: Yes    Comment: rare  . Drug use: No  . Sexual activity: Yes    Partners: Male    Birth control/protection: None  Other Topics Concern  . Not on file  Social History Narrative  . Not on file    Family History  Problem Relation Age of Onset  . Diabetes Paternal Grandmother   . Heart disease Neg Hx   . Hypertension Neg Hx   . Cancer Neg Hx        breast or colon cancer    The following portions of the patient's history were reviewed and updated as appropriate: allergies, current medications, past family history, past medical history, past social history, past surgical history and problem list.  Review of Systems Pertinent items noted in HPI and remainder of comprehensive ROS otherwise negative.   Objective:  BP (!) 144/88   Pulse 98   Ht 5' 9"  (1.753 m)  Wt 225 lb (102.1 kg)   BMI 33.23 kg/m  CONSTITUTIONAL: Well-developed, well-nourished female in no acute distress.  HENT:  Normocephalic, atraumatic, External right and left ear normal. Oropharynx is clear and moist EYES: Conjunctivae and EOM are normal. Pupils are equal, round, and reactive to light. No scleral icterus.  NECK: Normal range of motion, supple, no masses.  Normal thyroid.  SKIN: Skin is warm and dry. No rash noted. Not diaphoretic. No erythema. No pallor. NEUROLOGIC: Alert and oriented to person, place, and time. Normal reflexes, muscle tone coordination. No cranial nerve deficit noted. PSYCHIATRIC: Normal mood and affect. Normal behavior. Normal judgment and thought content. CARDIOVASCULAR: Normal  heart rate noted, regular rhythm RESPIRATORY: Clear to auscultation bilaterally. Effort and breath sounds normal, no problems with respiration noted. BREASTS: Symmetric in size. No masses, skin changes, nipple drainage, or lymphadenopathy. ABDOMEN: Soft, normal bowel sounds, no distention noted.  No tenderness, rebound or guarding.  PELVIC: Normal appearing external genitalia; normal appearing vaginal mucosa and cervix.  No abnormal discharge noted.  Pap smear obtained.  Enlarged uterine size , about 16 week size, no other palpable masses, no uterine or adnexal tenderness. MUSCULOSKELETAL: Normal range of motion. No tenderness.  No cyanosis, clubbing, or edema.  2+ distal pulses.   Assessment and Plan:  1. Abnormal uterine bleeding (AUB) 2. Anticoagulant long-term use 3. Fibroids Enlarged fibroid uterus on exam, also on Xarelto. These can contribute to prolonged bleeding. For now, will try Megace to se if this helps. Will follow up ultrasound results.  Also follow up pap and cervicovaginal ancillary testing.  - US PELVIC COMPLETE WITH TRANSVAGINAL; Future - megestrol (MEGACE) 40 MG tablet; Take 1 tablet (40 mg total) by mouth daily. Can increase to two tablets a day in the event of heavy bleeding  Dispense: 60 tablet; Refill: 5 - Cervicovaginal ancillary only  4. Encounter for screening mammogram for breast cancer - MM DIGITAL SCREENING BILATERAL; Future Mammogram scheduled  5. Encounter for gynecological examination with Papanicolaou smear of cervix - Cytology - PAP Will follow up results of pap smear and manage accordingly. Routine preventative health maintenance measures emphasized. Please refer to After Visit Summary for other counseling recommendations.    Verita Schneiders, MD, [place] Attending Obstetrician & Gynecologist, [place] for [facility]

## 2017-07-18 ENCOUNTER — Ambulatory Visit: Payer: BC Managed Care – PPO | Admitting: Hematology and Oncology

## 2017-07-18 ENCOUNTER — Other Ambulatory Visit: Payer: Self-pay | Admitting: Hematology and Oncology

## 2017-07-18 ENCOUNTER — Telehealth: Payer: Self-pay | Admitting: Hematology and Oncology

## 2017-07-18 LAB — CERVICOVAGINAL ANCILLARY ONLY
BACTERIAL VAGINITIS: NEGATIVE
CANDIDA VAGINITIS: NEGATIVE
Chlamydia: NEGATIVE
Neisseria Gonorrhea: NEGATIVE
Trichomonas: NEGATIVE

## 2017-07-18 LAB — IRON AND TIBC
%SAT: 5 % — AB (ref 21–57)
Iron: 26 ug/dL — ABNORMAL LOW (ref 41–142)
TIBC: 504 ug/dL — ABNORMAL HIGH (ref 236–444)
UIBC: 478 ug/dL — AB (ref 120–384)

## 2017-07-18 LAB — FERRITIN: FERRITIN: 5 ng/mL — AB (ref 9–269)

## 2017-07-18 NOTE — Assessment & Plan Note (Deleted)
Severe iron deficiency anemia: Lab review: Hemoglobin 9.3, MCV 70.4, RDW 20.9, iron saturation , ferritin  Suggestive of severe iron deficiency anemia We will administer 2 doses of Feraheme.  Recheck labs in 3 months and follow-up afterwards

## 2017-07-18 NOTE — Assessment & Plan Note (Deleted)
Recurrent DVT with antiphospholipid antibodies: First episode: 2007 after undergoing myomectomy, got anticoagulation initially with Coumadin and later with Lovenox for about 12 months ( Lovenox was given during her pregnancy in 2008 and discontinued after she delivered) Second episode: 2009 right leg DVT given Lovenox 6-7 months Third episodeFebruary 2015: Left leg DVT xarelto until November 2016 stopped for heavy bleeding --------------------------------------------------------------- Repeat testing for lupus anticoagulant in February 2017 was negative. In spite of this. Recommended lifelong anticoagulation based on recurrent blood clots.

## 2017-07-18 NOTE — Telephone Encounter (Signed)
I called the patient to inform her that she is profoundly iron deficient and that she needs IV iron therapy.  I asked her to call us back to schedule IV iron infusion appointment. She did blood work yesterday but missed appointment with me today. I can see her back again in 3 months after the iron infusion with recheck of her blood work and follow-up.  Blood work to be done at least one day ahead of the visit.

## 2017-07-18 NOTE — Patient Instructions (Signed)
Preventive Care 42-64 Years, Female Preventive care refers to lifestyle choices and visits with your health care provider that can promote health and wellness. What does preventive care include?  A yearly physical exam. This is also called an annual well check.  Dental exams once or twice a year.  Routine eye exams. Ask your health care provider how often you should have your eyes checked.  Personal lifestyle choices, including: ? Daily care of your teeth and gums. ? Regular physical activity. ? Eating a healthy diet. ? Avoiding tobacco and drug use. ? Limiting alcohol use. ? Practicing safe sex. ? Taking low-dose aspirin daily starting at age 58. ? Taking vitamin and mineral supplements as recommended by your health care provider. What happens during an annual well check? The services and screenings done by your health care provider during your annual well check will depend on your age, overall health, lifestyle risk factors, and family history of disease. Counseling Your health care provider may ask you questions about your:  Alcohol use.  Tobacco use.  Drug use.  Emotional well-being.  Home and relationship well-being.  Sexual activity.  Eating habits.  Work and work Statistician.  Method of birth control.  Menstrual cycle.  Pregnancy history.  Screening You may have the following tests or measurements:  Height, weight, and BMI.  Blood pressure.  Lipid and cholesterol levels. These may be checked every 5 years, or more frequently if you are over 81 years old.  Skin check.  Lung cancer screening. You may have this screening every year starting at age 78 if you have a 30-pack-year history of smoking and currently smoke or have quit within the past 15 years.  Fecal occult blood test (FOBT) of the stool. You may have this test every year starting at age 65.  Flexible sigmoidoscopy or colonoscopy. You may have a sigmoidoscopy every 5 years or a colonoscopy  every 10 years starting at age 30.  Hepatitis C blood test.  Hepatitis B blood test.  Sexually transmitted disease (STD) testing.  Diabetes screening. This is done by checking your blood sugar (glucose) after you have not eaten for a while (fasting). You may have this done every 1-3 years.  Mammogram. This may be done every 1-2 years. Talk to your health care provider about when you should start having regular mammograms. This may depend on whether you have a family history of breast cancer.  BRCA-related cancer screening. This may be done if you have a family history of breast, ovarian, tubal, or peritoneal cancers.  Pelvic exam and Pap test. This may be done every 3 years starting at age 80. Starting at age 36, this may be done every 5 years if you have a Pap test in combination with an HPV test.  Bone density scan. This is done to screen for osteoporosis. You may have this scan if you are at high risk for osteoporosis.  Discuss your test results, treatment options, and if necessary, the need for more tests with your health care provider. Vaccines Your health care provider may recommend certain vaccines, such as:  Influenza vaccine. This is recommended every year.  Tetanus, diphtheria, and acellular pertussis (Tdap, Td) vaccine. You may need a Td booster every 10 years.  Varicella vaccine. You may need this if you have not been vaccinated.  Zoster vaccine. You may need this after age 5.  Measles, mumps, and rubella (MMR) vaccine. You may need at least one dose of MMR if you were born in  1957 or later. You may also need a second dose.  Pneumococcal 13-valent conjugate (PCV13) vaccine. You may need this if you have certain conditions and were not previously vaccinated.  Pneumococcal polysaccharide (PPSV23) vaccine. You may need one or two doses if you smoke cigarettes or if you have certain conditions.  Meningococcal vaccine. You may need this if you have certain  conditions.  Hepatitis A vaccine. You may need this if you have certain conditions or if you travel or work in places where you may be exposed to hepatitis A.  Hepatitis B vaccine. You may need this if you have certain conditions or if you travel or work in places where you may be exposed to hepatitis B.  Haemophilus influenzae type b (Hib) vaccine. You may need this if you have certain conditions.  Talk to your health care provider about which screenings and vaccines you need and how often you need them. This information is not intended to replace advice given to you by your health care provider. Make sure you discuss any questions you have with your health care provider. Document Released: 09/10/2015 Document Revised: 05/03/2016 Document Reviewed: 06/15/2015 Elsevier Interactive Patient Education  2017 [employer].

## 2017-07-20 LAB — CYTOLOGY - PAP
Diagnosis: NEGATIVE
HPV: NOT DETECTED

## 2017-08-02 ENCOUNTER — Emergency Department (HOSPITAL_COMMUNITY): Payer: BC Managed Care – PPO

## 2017-08-02 ENCOUNTER — Emergency Department (HOSPITAL_COMMUNITY)
Admission: EM | Admit: 2017-08-02 | Discharge: 2017-08-02 | Disposition: A | Discharge status: Home / Self Care | Payer: BC Managed Care – PPO | Attending: Emergency Medicine | Admitting: Emergency Medicine

## 2017-08-02 ENCOUNTER — Encounter: Payer: Self-pay | Admitting: Family Medicine

## 2017-08-02 ENCOUNTER — Ambulatory Visit: Payer: BC Managed Care – PPO | Admitting: Family Medicine

## 2017-08-02 ENCOUNTER — Other Ambulatory Visit: Payer: Self-pay

## 2017-08-02 ENCOUNTER — Encounter (HOSPITAL_COMMUNITY): Payer: Self-pay | Admitting: Nurse Practitioner

## 2017-08-02 VITALS — BP 120/80 | HR 90 | Temp 98.4°F | Ht 68.75 in | Wt 223.5 lb

## 2017-08-02 DIAGNOSIS — K460 Unspecified abdominal hernia with obstruction, without gangrene: Secondary | ICD-10-CM

## 2017-08-02 DIAGNOSIS — Z79899 Other long term (current) drug therapy: Secondary | ICD-10-CM | POA: Diagnosis not present

## 2017-08-02 DIAGNOSIS — K429 Umbilical hernia without obstruction or gangrene: Secondary | ICD-10-CM | POA: Insufficient documentation

## 2017-08-02 LAB — COMPREHENSIVE METABOLIC PANEL
ALT: 13 U/L — ABNORMAL LOW (ref 14–54)
ANION GAP: 6 (ref 5–15)
AST: 15 U/L (ref 15–41)
Albumin: 3.9 g/dL (ref 3.5–5.0)
Alkaline Phosphatase: 79 U/L (ref 38–126)
BILIRUBIN TOTAL: 0.5 mg/dL (ref 0.3–1.2)
BUN: 11 mg/dL (ref 6–20)
CALCIUM: 9 mg/dL (ref 8.9–10.3)
CO2: 27 mmol/L (ref 22–32)
Chloride: 106 mmol/L (ref 101–111)
Creatinine, Ser: 0.75 mg/dL (ref 0.44–1.00)
GFR calc Af Amer: >60 mL/min (ref >60)
Glucose, Bld: 93 mg/dL (ref 65–99)
POTASSIUM: 3.8 mmol/L (ref 3.5–5.1)
SODIUM: 139 mmol/L (ref 135–145)
TOTAL PROTEIN: 7.8 g/dL (ref 6.5–8.1)

## 2017-08-02 LAB — URINALYSIS, ROUTINE W REFLEX MICROSCOPIC
Bilirubin Urine: NEGATIVE
GLUCOSE, UA: NEGATIVE mg/dL
KETONES UR: NEGATIVE mg/dL
Leukocytes, UA: NEGATIVE
Nitrite: NEGATIVE
PH: 6 (ref 5.0–8.0)
Protein, ur: NEGATIVE mg/dL
SPECIFIC GRAVITY, URINE: 1.024 (ref 1.005–1.030)

## 2017-08-02 LAB — CBC
HCT: 26.5 % — ABNORMAL LOW (ref 36.0–46.0)
Hemoglobin: 8 g/dL — ABNORMAL LOW (ref 12.0–15.0)
MCH: 21.4 pg — ABNORMAL LOW (ref 26.0–34.0)
MCHC: 30.2 g/dL (ref 30.0–36.0)
MCV: 71 fL — ABNORMAL LOW (ref 78.0–100.0)
Platelets: 304 K/uL (ref 150–400)
RBC: 3.73 MIL/uL — ABNORMAL LOW (ref 3.87–5.11)
RDW: 17.7 % — ABNORMAL HIGH (ref 11.5–15.5)
WBC: 4.8 K/uL (ref 4.0–10.5)

## 2017-08-02 LAB — I-STAT BETA HCG BLOOD, ED (MC, WL, AP ONLY): I-stat hCG, quantitative: <5 m[IU]/mL (ref <5)

## 2017-08-02 MED ORDER — IOPAMIDOL (ISOVUE-300) INJECTION 61%
100.0000 mL | Freq: Once | INTRAVENOUS | Status: AC | PRN
  Administered 2017-08-02: 100 mL via INTRAVENOUS

## 2017-08-02 NOTE — ED Triage Notes (Signed)
Patient reports noticing some protrusion in belly button area about 3 days ago with increased pain over the past several days. She was seen at her PCP this morning who told her she had a hernia and to come over to be evaluated after talking to the surgery team. Patient rates pain 5/10. Denies any nausea, vomiting, or pain that radiates. Site is tender to touch.

## 2017-08-02 NOTE — ED Notes (Signed)
Being sent by Maryanna Shape, possible incarcerated hernia-MD has notified general surgery

## 2017-08-02 NOTE — ED Provider Notes (Signed)
[facility] DEPT Provider Note   CSN: [id number] Arrival date & time: 08/02/17  1000     History   Chief Complaint Chief Complaint  Patient presents with  . Hernia    HPI [name] is a 41 y.o. female.  HPI Patient is a 40 year old female presents the emergency department complaining of protrusion around her umbilicus and discomfort over the past 3 days.  She denies nausea vomiting.  She denies fevers and chills.  She has never had an umbilical hernia before.  Denies diarrhea.  No back pain.  No urinary complaints.   Past Medical History:  Diagnosis Date  . Breast cyst    BILATERAL  . DVT (deep venous thrombosis) ([place])   . Fibroids   . Obesity     Patient Active Problem List   Diagnosis Date Noted  . Anemia 03/09/2017  . Preventative health care 03/08/2017  . Acute blood loss anemia 10/06/2014  . Recurrent deep vein thrombosis (DVT) ([place]) 09/30/2014  . Infertility, female 07/08/2012  . Fibroids 06/17/2012  . DUB (dysfunctional uterine bleeding) 06/14/2012  . DEEP VENOUS THROMBOPHLEBITIS, HX OF 04/10/2007    Past Surgical History:  Procedure Laterality Date  . CESAREAN SECTION    . DILITATION & CURRETTAGE/HYSTROSCOPY WITH HYDROTHERMAL ABLATION N/A 10/08/2014   Procedure: DILATATION & CURETTAGE/HYSTEROSCOPY WITH HYDROTHERMAL ABLATION;  Surgeon: Osborne Oman, MD;  Location: [facility];  Service: Gynecology;  Laterality: N/A;  . MYOMECTOMY      OB History    Gravida Para Term Preterm AB Living   1 1 0 0 0 1   SAB TAB Ectopic Multiple Live Births   0 0 0 0 1       Home Medications    Prior to Admission medications   Medication Sig Start Date End Date Taking? Authorizing Provider  ibuprofen (ADVIL,MOTRIN) 200 MG tablet Take 400 mg by mouth every 6 (six) hours as needed for fever, headache, mild pain, moderate pain or cramping.   Yes [provider]  XARELTO 20 MG TABS tablet TAKE 1 TABLET (20 MG TOTAL) BY MOUTH  DAILY WITH SUPPER. Patient taking differently: Take 20 mg by mouth daily with supper. *Doesn't take during cycle* 05/29/16  Yes Venia Carbon, MD  megestrol (MEGACE) 40 MG tablet Take 1 tablet (40 mg total) by mouth daily. Can increase to two tablets a day in the event of heavy bleeding Patient not taking: Reported on 08/02/2017 07/17/17   Osborne Oman, MD    Family History Family History  Problem Relation Age of Onset  . Diabetes Paternal Grandmother   . Heart disease Neg Hx   . Hypertension Neg Hx   . Cancer Neg Hx        breast or colon cancer    Social History Social History   Tobacco Use  . Smoking status: Never Smoker  . Smokeless tobacco: Never Used  Substance Use Topics  . Alcohol use: Yes    Comment: rare  . Drug use: No     Allergies   Patient has no known allergies.   Review of Systems Review of Systems  All other systems reviewed and are negative.    Physical Exam Updated Vital Signs BP 124/90 (BP Location: Right Arm)   Pulse 80   Temp (!) 97.3 F (36.3 C) (Oral)   Resp 16   Ht 5' 8"  (1.727 m)   Wt 101.4 kg (223 lb 8 oz)   LMP 07/21/2017  SpO2 100%   BMI 33.98 kg/m   Physical Exam  Constitutional: She is oriented to person, place, and time. She appears well-developed and well-nourished.  HENT:  Head: Normocephalic.  Eyes: EOM are normal.  Neck: Normal range of motion.  Cardiovascular: Normal rate.  Pulmonary/Chest: Effort normal.  Abdominal:  Umbilical hernia palpable.  Unreducible  Musculoskeletal: Normal range of motion.  Neurological: She is alert and oriented to person, place, and time.  Psychiatric: She has a normal mood and affect.  Nursing note and vitals reviewed.    ED Treatments / Results  Labs (all labs ordered are listed, but only abnormal results are displayed) Labs Reviewed  CBC - Abnormal; Notable for the following components:      Result Value   RBC 3.73 (*)    Hemoglobin 8.0 (*)    HCT 26.5 (*)    MCV  71.0 (*)    MCH 21.4 (*)    RDW 17.7 (*)    All other components within normal limits  COMPREHENSIVE METABOLIC PANEL - Abnormal; Notable for the following components:   ALT 13 (*)    All other components within normal limits  URINALYSIS, ROUTINE W REFLEX MICROSCOPIC - Abnormal; Notable for the following components:   Hgb urine dipstick LARGE (*)    Bacteria, UA RARE (*)    Squamous Epithelial / LPF 0-5 (*)    All other components within normal limits  I-STAT BETA HCG BLOOD, ED ([facility], [facility], [facility] ONLY)    EKG  EKG Interpretation None       Radiology Ct Abdomen Pelvis W Contrast  Result Date: 08/02/2017 CLINICAL DATA:  Abdominal infection with pain in the belly button area. EXAM: CT ABDOMEN AND PELVIS WITH CONTRAST TECHNIQUE: Multidetector CT imaging of the abdomen and pelvis was performed using the standard protocol following bolus administration of intravenous contrast. CONTRAST:  15m ISOVUE-300 IOPAMIDOL (ISOVUE-300) INJECTION 61% COMPARISON:  None. FINDINGS: Lower chest:  Unremarkable. Hepatobiliary: No focal abnormality within the liver parenchyma. There is no evidence for gallstones, gallbladder wall thickening, or pericholecystic fluid. No intrahepatic or extrahepatic biliary dilation. Pancreas: No focal mass lesion. No dilatation of the main duct. No intraparenchymal cyst. No peripancreatic edema. Spleen: No splenomegaly. No focal mass lesion. Adrenals/Urinary Tract: No adrenal nodule or mass. Kidneys are unremarkable. No evidence for hydroureter. The urinary bladder appears normal for the degree of distention. Stomach/Bowel: Stomach is nondistended. No gastric wall thickening. No evidence of outlet obstruction. Duodenum is normally positioned as is the ligament of Treitz. No small bowel wall thickening. No small bowel dilatation. The terminal ileum is normal. The appendix is not visualized, but there is no edema or inflammation in the region of the cecum. No gross colonic mass. No colonic  wall thickening. No substantial diverticular change. Vascular/Lymphatic: No abdominal aortic aneurysm. No abdominal aortic atherosclerotic calcification. There is no gastrohepatic or hepatoduodenal ligament lymphadenopathy. No intraperitoneal or retroperitoneal lymphadenopathy. No pelvic sidewall lymphadenopathy. Reproductive: Uterus appears enlarged measuring 15.5 x 11.0 x 13.2 cm. Anterior deviation of the endometrial stripe suggest the presence of a large posterior intramural fibroid. 6.2 x 4.4 x 5.4 cm complex cystic mass identified posterior left adnexal space. Right ovary is normal in appearance. Other: Trace intraperitoneal free fluid evident Musculoskeletal: Umbilical hernia contains omental fat and there is edema/inflammation/fluid in the hernia sac suggesting fat incarceration. There may be adjacent discrete fashion defects in this area giving the hernia a somewhat complex appearance with overall process creating a 3.3 x 5.6 x 3.9 cm  hernia sac. Although the mid transverse colon is pulled towards the hernia, no colon is identified extending through the fascial defect. There is no adjacent colonic wall thickening. IMPRESSION: 1. Umbilical hernia contains omental fat with edema/fluid suggesting fat incarceration. Mid transverse colon is pulled towards the hernia, but does not appear to extend through the fascial defect and there is no associated colonic wall thickening or colonic obstruction. 2. 6 x 4 x 5 cm complex cystic mass posterior left adnexal space. Pelvic ultrasound recommended to further evaluate. 3. Uterine enlargement has progressed since ultrasound exam of 08/18/2015 with apparent large posterior intramural fibroid. Electronically Signed   By: Misty Stanley M.D.   On: 08/02/2017 14:35    Procedures Procedures (including critical care time)  Medications Ordered in ED Medications  iopamidol (ISOVUE-300) 61 % injection 100 mL (100 mLs Intravenous Contrast Given 08/02/17 1400)     Initial  Impression / Assessment and Plan / ED Course  I have reviewed the triage vital signs and the nursing notes.  Pertinent labs & imaging results that were available during my care of the patient were reviewed by me and considered in my medical decision making (see chart for details).     Fat-containing hernia only.  No bowel involved.  No nausea or vomiting.  Pain improved.  Discharged home in good condition.  Anti-inflammatories recommended.  Outpatient general surgery follow-up for possible elective repair.  No indication for emergent evaluation at this time given fat-containing only.  In regards to the adnexal mass noted she already has a scheduled follow-up with her gynecologist for an ultrasound this month.  Final Clinical Impressions(s) / ED Diagnoses   Final diagnoses:  Umbilical hernia without obstruction and without gangrene    ED Discharge Orders    None       Jola Schmidt, MD 08/02/17 [phone]

## 2017-08-02 NOTE — Progress Notes (Signed)
Dr. Frederico Hamman T. Lillianah Swartzentruber, MD, [facility] [place], [address] Phone: [phone] Fax: [phone]  08/02/2017  Patient: [name], MRN: [id number], DOB: Jun 03, 1976, 41 y.o.  Primary Physician:  Venia Carbon, MD   Chief Complaint  Patient presents with  . Abdominal Pain    Pain & Pressure around belly button x 3 days   Subjective:   [name] is a 41 y.o. very pleasant female patient who presents with the following:  Could see something new.  This began about 3 days ago.  She noted about 10 years ago after the birth of her child that she had a small umbilical hernia, but up until now this is not caused her any problems.  Approximately 3 days ago this started to bulge and she had pain and pressure around her bellybutton.  She has not been able to reduce the bulging area anterior to the belly button.  She has been able to eat and drink okay, and she has been passing stool as well as flatus.  3 days - ? Incarcerated hernia?  Called [facility] Spoke to [facility] - go to ER  Past Medical History, Surgical History, Social History, Family History, Problem List, Medications, and Allergies have been reviewed and updated if relevant.  Patient Active Problem List   Diagnosis Date Noted  . Anemia 03/09/2017  . Preventative health care 03/08/2017  . Acute blood loss anemia 10/06/2014  . Recurrent deep vein thrombosis (DVT) ([place]) 09/30/2014  . Infertility, female 07/08/2012  . Fibroids 06/17/2012  . DUB (dysfunctional uterine bleeding) 06/14/2012  . DEEP VENOUS THROMBOPHLEBITIS, HX OF 04/10/2007    Past Medical History:  Diagnosis Date  . Breast cyst    BILATERAL  . DVT (deep venous thrombosis) ([place])   . Fibroids   . Obesity     Past Surgical History:  Procedure Laterality Date  . CESAREAN SECTION    . DILITATION & CURRETTAGE/HYSTROSCOPY WITH HYDROTHERMAL ABLATION N/A 10/08/2014   Procedure: DILATATION &  CURETTAGE/HYSTEROSCOPY WITH HYDROTHERMAL ABLATION;  Surgeon: Osborne Oman, MD;  Location: [facility];  Service: Gynecology;  Laterality: N/A;  . MYOMECTOMY      Social History   Socioeconomic History  . Marital status: Married    Spouse name: Not on file  . Number of children: 1  . Years of education: Not on file  . Highest education level: Not on file  Social Needs  . Financial resource strain: Not on file  . Food insecurity - worry: Not on file  . Food insecurity - inability: Not on file  . Transportation needs - medical: Not on file  . Transportation needs - non-medical: Not on file  Occupational History  . 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 Smoking status: Never Smoker  . Smokeless tobacco: Never Used  Substance and Sexual Activity  . Alcohol use: Yes    Comment: rare  . Drug use: No  . Sexual activity: Yes    Partners: Male    Birth control/protection: None  Other Topics Concern  . Not on file  Social History Narrative  . Not on file    Family History  Problem Relation Age of Onset  . Diabetes Paternal Grandmother   . Heart disease Neg Hx   . Hypertension Neg Hx   . Cancer Neg Hx        breast or colon cancer  No Known Allergies  Medication list reviewed and updated in full in [place].   GEN: No acute illnesses, no fevers, chills. GI: No n/v/d, eating normally Pulm: No SOB Interactive and getting along well at home.  Otherwise, ROS is as per the HPI.  Objective:   BP 120/80   Pulse 90   Temp 98.4 F (36.9 C) (Oral)   Ht 5' 8.75" (1.746 m)   Wt 223 lb 8 oz (101.4 kg)   LMP 07/21/2017   BMI 33.25 kg/m   GEN: WDWN, NAD, Non-toxic, A & O x 3 HEENT: Atraumatic, Normocephalic. Neck supple. No masses, No LAD. Ears and Nose: No external deformity. CV: RRR, No M/G/R. No JVD. No thrill. No extra heart sounds. PULM: CTA B, no wheezes, crackles, rhonchi. No retractions. No resp. distress. No accessory  muscle use. ABD: S, at the area of the umbilicus there is a protrusion that is approximately 2-1/2 cm across that is hard and tender to palpation.  I am not able to reproduce this area.  Globally there is not tenderness in the upper quadrants or significantly lateral to the umbilicus., ND, + BS, No rebound, No HSM  EXTR: No c/c/e NEURO Normal gait.  PSYCH: Normally interactive. Conversant. Not depressed or anxious appearing.  Calm demeanor.   Laboratory and Imaging Data:  Assessment and Plan:   Incarcerated hernia  Clinically, consistent with incarcerated umbilical hernia.  I spoke with general surgery at [facility], and they recommended that the patient be evaluated at the emergency room.  The patient is going to go to [facility].  Follow-up: No Follow-up on file.  Future Appointments  Date Time Provider [place]  08/23/2017 11:40 AM GI-BCG MM 2 GI-BCGMM GI-BREAST CE  08/24/2017  9:00 AM WH-US 1 WH-US 203    Signed,  Ladaisha Portillo T. Rathana Viveros, MD   Allergies as of 08/02/2017   No Known Allergies     Medication List        Accurate as of 08/02/17  9:54 AM. Always use your most recent med list.          megestrol 40 MG tablet Commonly known as:  MEGACE Take 1 tablet (40 mg total) by mouth daily. Can increase to two tablets a day in the event of heavy bleeding   XARELTO 20 MG Tabs tablet Generic drug:  rivaroxaban TAKE 1 TABLET (20 MG TOTAL) BY MOUTH DAILY WITH SUPPER.

## 2017-08-02 NOTE — Patient Instructions (Signed)
Go directly to

## 2017-08-03 ENCOUNTER — Telehealth: Payer: Self-pay | Admitting: Internal Medicine

## 2017-08-03 ENCOUNTER — Ambulatory Visit: Payer: BC Managed Care – PPO | Admitting: Internal Medicine

## 2017-08-03 ENCOUNTER — Telehealth: Payer: Self-pay

## 2017-08-03 NOTE — Telephone Encounter (Signed)
Left a message for pt to call the office to see how she is doing after her ER visit yesterday.

## 2017-08-03 NOTE — Telephone Encounter (Signed)
Patient has decided to wait to do the surgery. She has a lot going on now and she will let provider when she is ready to move forward with the surgery. Dr Silvio Pate can contact her on MyChart or cell # if he needs to.

## 2017-08-03 NOTE — Telephone Encounter (Signed)
Please call her to let her know that it is okay if she waits as long as she is not having pain. The CT scan did not show bowel in the hernia so she is not at risk of dangerous problems but she could have pain. If she has ongoing problems, have her call for referral to see a surgeon

## 2017-08-08 NOTE — Telephone Encounter (Signed)
Left message on vm per dpr

## 2017-08-23 ENCOUNTER — Ambulatory Visit
Admission: RE | Admit: 2017-08-23 | Discharge: 2017-08-23 | Disposition: A | Discharge status: Home / Self Care | Payer: BC Managed Care – PPO | Source: Ambulatory Visit | Attending: Obstetrics & Gynecology | Admitting: Obstetrics & Gynecology

## 2017-08-23 DIAGNOSIS — Z1231 Encounter for screening mammogram for malignant neoplasm of breast: Secondary | ICD-10-CM

## 2017-08-24 ENCOUNTER — Other Ambulatory Visit: Payer: Self-pay | Admitting: Obstetrics & Gynecology

## 2017-08-24 ENCOUNTER — Ambulatory Visit (HOSPITAL_COMMUNITY)
Admission: RE | Admit: 2017-08-24 | Discharge: 2017-08-24 | Disposition: A | Discharge status: Home / Self Care | Payer: BC Managed Care – PPO | Source: Ambulatory Visit | Attending: Obstetrics & Gynecology | Admitting: Obstetrics & Gynecology

## 2017-08-24 DIAGNOSIS — N939 Abnormal uterine and vaginal bleeding, unspecified: Secondary | ICD-10-CM | POA: Diagnosis present

## 2017-08-24 DIAGNOSIS — D259 Leiomyoma of uterus, unspecified: Secondary | ICD-10-CM | POA: Insufficient documentation

## 2017-08-24 DIAGNOSIS — Z7901 Long term (current) use of anticoagulants: Secondary | ICD-10-CM | POA: Diagnosis not present

## 2017-08-24 DIAGNOSIS — D219 Benign neoplasm of connective and other soft tissue, unspecified: Secondary | ICD-10-CM

## 2017-08-24 DIAGNOSIS — R928 Other abnormal and inconclusive findings on diagnostic imaging of breast: Secondary | ICD-10-CM

## 2017-09-12 ENCOUNTER — Other Ambulatory Visit: Payer: Self-pay | Admitting: Internal Medicine

## 2017-09-13 NOTE — Telephone Encounter (Signed)
Approved: okay x 1 year

## 2017-09-14 ENCOUNTER — Encounter (HOSPITAL_COMMUNITY): Payer: BC Managed Care – PPO

## 2017-09-14 ENCOUNTER — Ambulatory Visit (HOSPITAL_COMMUNITY)
Admission: RE | Admit: 2017-09-14 | Discharge: 2017-09-14 | Disposition: A | Discharge status: Home / Self Care | Payer: BC Managed Care – PPO | Source: Ambulatory Visit | Attending: Cardiology | Admitting: Cardiology

## 2017-09-14 ENCOUNTER — Ambulatory Visit: Admission: RE | Admit: 2017-09-14 | Payer: BC Managed Care – PPO | Source: Ambulatory Visit

## 2017-09-14 ENCOUNTER — Encounter: Payer: Self-pay | Admitting: Internal Medicine

## 2017-09-14 ENCOUNTER — Ambulatory Visit: Payer: BC Managed Care – PPO | Admitting: Internal Medicine

## 2017-09-14 ENCOUNTER — Telehealth: Payer: Self-pay

## 2017-09-14 VITALS — BP 112/78 | HR 108 | Temp 98.7°F | Wt 213.0 lb

## 2017-09-14 DIAGNOSIS — J0100 Acute maxillary sinusitis, unspecified: Secondary | ICD-10-CM | POA: Insufficient documentation

## 2017-09-14 DIAGNOSIS — I82409 Acute embolism and thrombosis of unspecified deep veins of unspecified lower extremity: Secondary | ICD-10-CM

## 2017-09-14 DIAGNOSIS — I82411 Acute embolism and thrombosis of right femoral vein: Secondary | ICD-10-CM | POA: Insufficient documentation

## 2017-09-14 DIAGNOSIS — I825Z2 Chronic embolism and thrombosis of unspecified deep veins of left distal lower extremity: Secondary | ICD-10-CM | POA: Diagnosis not present

## 2017-09-14 MED ORDER — AMOXICILLIN 500 MG PO TABS: 1000.0000 mg | ORAL_TABLET | Freq: Two times a day (BID) | ORAL | 0 refills | Status: AC

## 2017-09-14 MED ORDER — RIVAROXABAN 15 MG PO TABS: 15.0000 mg | ORAL_TABLET | Freq: Two times a day (BID) | ORAL | 0 refills | Status: DC

## 2017-09-14 NOTE — Addendum Note (Signed)
Addended by: Pilar Grammes on: 09/14/2017 09:38 AM   Modules accepted: Orders

## 2017-09-14 NOTE — Assessment & Plan Note (Signed)
2 weeks of symptoms and not resolving Will give trial of amoxil

## 2017-09-14 NOTE — Progress Notes (Signed)
Subjective:    Patient ID: [name], female    DOB: [birth year], 42 y.o.   MRN: [id number]  HPI Here due to leg swelling and respiratory symptoms  Started with right calf pain--very reminiscent of past DVT Hurts with walking This week in calf and worsening Now with some thigh pain Regular with xarelto--but holds some days due to heavy bleeding (LMP 1/7 and still going some)  Not exercising---more sedentary lately No recent travel No trauma or leg injury  Bad cold---going on for 2 weeks Cough--- some phlegm Decreased appetite No fever, chills. Slight sweats No SOB No chest pain Using dayquil/nyquil--?some help  Current Outpatient Medications on File Prior to Visit  Medication Sig Dispense Refill  . ibuprofen (ADVIL,MOTRIN) 200 MG tablet Take 400 mg by mouth every 6 (six) hours as needed for fever, headache, mild pain, moderate pain or cramping.    . megestrol (MEGACE) 40 MG tablet Take 1 tablet (40 mg total) by mouth daily. Can increase to two tablets a day in the event of heavy bleeding 60 tablet 5  . XARELTO 20 MG TABS tablet TAKE 1 TABLET (20 MG TOTAL) BY MOUTH DAILY WITH SUPPER. 30 tablet 11   No current facility-administered medications on file prior to visit.     No Known Allergies  Past Medical History:  Diagnosis Date  . Breast cyst    BILATERAL  . DVT (deep venous thrombosis) ([place])   . Fibroids   . Obesity     Past Surgical History:  Procedure Laterality Date  . CESAREAN SECTION    . DILITATION & CURRETTAGE/HYSTROSCOPY WITH HYDROTHERMAL ABLATION N/A 10/08/2014   Procedure: DILATATION & CURETTAGE/HYSTEROSCOPY WITH HYDROTHERMAL ABLATION;  Surgeon: Osborne Oman, MD;  Location: [facility];  Service: Gynecology;  Laterality: N/A;  . MYOMECTOMY      Family History  Problem Relation Age of Onset  . Diabetes Paternal Grandmother   . Heart disease Neg Hx   . Hypertension Neg Hx   . Cancer Neg Hx        breast or colon cancer    Social History    Socioeconomic History  . Marital status: Married    Spouse name: Not on file  . Number of children: 1  . Years of education: Not on file  . Highest education level: Not on file  Social Needs  . Financial resource strain: Not on file  . Food insecurity - worry: Not on file  . Food insecurity - inability: Not on file  . Transportation needs - medical: Not on file  . Transportation needs - non-medical: Not on file  Occupational History  . 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 Smoking status: Never Smoker  . Smokeless tobacco: Never Used  Substance and Sexual Activity  . Alcohol use: Yes    Comment: rare  . Drug use: No  . Sexual activity: Yes    Partners: Male    Birth control/protection: None  Other Topics Concern  . Not on file  Social History Narrative  . Not on file   Review of Systems Gyn has prescribed the megace to try to reduce bleeding No rash Some vomiting and loose stools    Objective:   Physical Exam  Constitutional: No distress.  HENT:  Mouth/Throat: Oropharynx is clear and moist. No oropharyngeal exudate.  No sinus tenderness TMs normal Moderate nasal inflammation  Neck: No thyromegaly present.  Pulmonary/Chest: Effort normal and breath sounds normal. No  respiratory distress. She has no wheezes. She has no rales.  Musculoskeletal:  Right calf tight with slight tenderness but not bigger than left Right thigh has medial tenderness but is not enlarged  Lymphadenopathy:    She has no cervical adenopathy.          Assessment & Plan:

## 2017-09-14 NOTE — Telephone Encounter (Signed)
Copied from [place] [id number]. Topic: Quick Communication - Office Called Patient >> Sep 14, 2017  2:27 PM Camillia Herter wrote: Reason for CRM: I left a message on patient's voice mail to call back and schedule follow up appointment with Dr.Letvak. Dr.Letvak asked to please set her up for follow up next week--Wednesday or later . >> Sep 14, 2017  2:53 PM Oneta Rack wrote: Patient returned call and scheduled follow up with PCP for 09/20/17.

## 2017-09-14 NOTE — Addendum Note (Signed)
Addended by: Pilar Grammes on: 09/14/2017 10:00 AM   Modules accepted: Orders

## 2017-09-14 NOTE — Assessment & Plan Note (Addendum)
Inconsistent with xarelto during period and now several days of increased right side symptoms Will check stat ultrasound Increased xarelto to 15bid for 3 weeks if positive  Call report at 1PM Ultrasound is positive for clot Discussed with patient Will change to xarelto 15 bid and she will not miss any doses 911 for any chest pain or SOB We will set up a follow up next week---consider vascular surgery or hematology or both if not better

## 2017-09-14 NOTE — Addendum Note (Signed)
Addended by: Viviana Simpler I on: 09/14/2017 01:06 PM   Modules accepted: Orders

## 2017-09-14 NOTE — Telephone Encounter (Signed)
FYI to [place].

## 2017-09-17 ENCOUNTER — Telehealth: Payer: Self-pay

## 2017-09-17 ENCOUNTER — Ambulatory Visit
Admission: RE | Admit: 2017-09-17 | Discharge: 2017-09-17 | Disposition: A | Discharge status: Home / Self Care | Payer: BC Managed Care – PPO | Source: Ambulatory Visit | Attending: Obstetrics & Gynecology | Admitting: Obstetrics & Gynecology

## 2017-09-17 DIAGNOSIS — R928 Other abnormal and inconclusive findings on diagnostic imaging of breast: Secondary | ICD-10-CM

## 2017-09-17 NOTE — Telephone Encounter (Signed)
Pt has appt with Dr Silvio Pate on 09/20/17 f/u of leg.

## 2017-09-17 NOTE — Telephone Encounter (Signed)
New note written. Left message on vm that new letter is up front.

## 2017-09-17 NOTE — Telephone Encounter (Signed)
Copied from [place] [id number]. Topic: General - Other >> Sep 17, 2017  1:37 PM [name] wrote: Reason for CRM: Patient is asking for work note to be extended to this 09/19/17, her leg is still bothering her.

## 2017-09-17 NOTE — Telephone Encounter (Signed)
Okay to extend note till 1/23 or even 1/24

## 2017-09-20 ENCOUNTER — Encounter: Payer: Self-pay | Admitting: *Deleted

## 2017-09-20 ENCOUNTER — Encounter: Payer: Self-pay | Admitting: Internal Medicine

## 2017-09-20 ENCOUNTER — Ambulatory Visit: Payer: BC Managed Care – PPO | Admitting: Internal Medicine

## 2017-09-20 VITALS — BP 124/72 | HR 126 | Temp 98.6°F | Wt 209.5 lb

## 2017-09-20 DIAGNOSIS — I82409 Acute embolism and thrombosis of unspecified deep veins of unspecified lower extremity: Secondary | ICD-10-CM

## 2017-09-20 NOTE — Patient Instructions (Signed)
Continue the xarelto at 22m twice a day for the full 3 weeks, and then restart the 259mdaily.

## 2017-09-20 NOTE — Progress Notes (Signed)
Subjective:    Patient ID: [name], female    DOB: 01-12-1976, 42 y.o.   MRN: [id number]  HPI Here for follow up of right leg recurrent DVT  Leg is some better Hasn't been able to go back to work yet due to the pain--but hopes to go back on Monday  On the higher dose of xarelto for now No bleeding problems  Current Outpatient Medications on File Prior to Visit  Medication Sig Dispense Refill  . amoxicillin (AMOXIL) 500 MG tablet Take 2 tablets (1,000 mg total) by mouth 2 (two) times daily for 7 days. 28 tablet 0  . ibuprofen (ADVIL,MOTRIN) 200 MG tablet Take 400 mg by mouth every 6 (six) hours as needed for fever, headache, mild pain, moderate pain or cramping.    . megestrol (MEGACE) 40 MG tablet Take 1 tablet (40 mg total) by mouth daily. Can increase to two tablets a day in the event of heavy bleeding 60 tablet 5  . Rivaroxaban (XARELTO) 15 MG TABS tablet Take 1 tablet (15 mg total) by mouth 2 (two) times daily with a meal. 42 tablet 0  . XARELTO 20 MG TABS tablet TAKE 1 TABLET (20 MG TOTAL) BY MOUTH DAILY WITH SUPPER. 30 tablet 11   No current facility-administered medications on file prior to visit.     No Known Allergies  Past Medical History:  Diagnosis Date  . Breast cyst    BILATERAL  . DVT (deep venous thrombosis) ([place])   . Fibroids   . Obesity     Past Surgical History:  Procedure Laterality Date  . CESAREAN SECTION    . DILITATION & CURRETTAGE/HYSTROSCOPY WITH HYDROTHERMAL ABLATION N/A 10/08/2014   Procedure: DILATATION & CURETTAGE/HYSTEROSCOPY WITH HYDROTHERMAL ABLATION;  Surgeon: Osborne Oman, MD;  Location: [facility];  Service: Gynecology;  Laterality: N/A;  . MYOMECTOMY      Family History  Problem Relation Age of Onset  . Diabetes Paternal Grandmother   . Heart disease Neg Hx   . Hypertension Neg Hx   . Cancer Neg Hx        breast or colon cancer    Social History   Socioeconomic History  . Marital status: Married    Spouse name:  Not on file  . Number of children: 1  . Years of education: Not on file  . Highest education level: Not on file  Social Needs  . Financial resource strain: Not on file  . Food insecurity - worry: Not on file  . Food insecurity - inability: Not on file  . Transportation needs - medical: Not on file  . Transportation needs - non-medical: Not on file  Occupational History  . 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 Smoking status: Never Smoker  . Smokeless tobacco: Never Used  Substance and Sexual Activity  . Alcohol use: Yes    Comment: rare  . Drug use: No  . Sexual activity: Yes    Partners: Male    Birth control/protection: None  Other Topics Concern  . Not on file  Social History Narrative  . Not on file   Review of Systems  No chest pain Some DOE--if she rushes with walking (slight difference from baseline) Done with menses--- considering embolization of the fibroid     Objective:   Physical Exam  Musculoskeletal:  Mild tenderness at right ankle Right calf is tighter and slightly larger than left Thighs are similar size but mild  right medial tenderness          Assessment & Plan:

## 2017-09-20 NOTE — Assessment & Plan Note (Signed)
Improved from last week Tolerating the higher xarelto dose Nothing to suggest PE Okay to start light exercise Note--RTW on 1/28 "911" for sig CP/SOB

## 2017-09-28 DIAGNOSIS — D649 Anemia, unspecified: Secondary | ICD-10-CM

## 2017-09-28 HISTORY — DX: Anemia, unspecified: D64.9

## 2017-10-02 ENCOUNTER — Observation Stay (HOSPITAL_COMMUNITY)
Admission: EM | Admit: 2017-10-02 | Discharge: 2017-10-03 | Disposition: A | Discharge status: Home / Self Care | Payer: BC Managed Care – PPO | Attending: Internal Medicine | Admitting: Internal Medicine

## 2017-10-02 ENCOUNTER — Other Ambulatory Visit: Payer: Self-pay

## 2017-10-02 ENCOUNTER — Encounter (HOSPITAL_COMMUNITY): Payer: Self-pay | Admitting: Emergency Medicine

## 2017-10-02 DIAGNOSIS — R000 Tachycardia, unspecified: Secondary | ICD-10-CM

## 2017-10-02 DIAGNOSIS — R531 Weakness: Secondary | ICD-10-CM | POA: Insufficient documentation

## 2017-10-02 DIAGNOSIS — N938 Other specified abnormal uterine and vaginal bleeding: Secondary | ICD-10-CM | POA: Diagnosis not present

## 2017-10-02 DIAGNOSIS — R42 Dizziness and giddiness: Secondary | ICD-10-CM | POA: Insufficient documentation

## 2017-10-02 DIAGNOSIS — D649 Anemia, unspecified: Secondary | ICD-10-CM | POA: Diagnosis present

## 2017-10-02 DIAGNOSIS — D259 Leiomyoma of uterus, unspecified: Secondary | ICD-10-CM | POA: Insufficient documentation

## 2017-10-02 DIAGNOSIS — Z8672 Personal history of thrombophlebitis: Secondary | ICD-10-CM

## 2017-10-02 DIAGNOSIS — E669 Obesity, unspecified: Secondary | ICD-10-CM | POA: Diagnosis not present

## 2017-10-02 DIAGNOSIS — D509 Iron deficiency anemia, unspecified: Principal | ICD-10-CM | POA: Insufficient documentation

## 2017-10-02 DIAGNOSIS — Z7901 Long term (current) use of anticoagulants: Secondary | ICD-10-CM | POA: Insufficient documentation

## 2017-10-02 DIAGNOSIS — R55 Syncope and collapse: Secondary | ICD-10-CM

## 2017-10-02 DIAGNOSIS — Z6830 Body mass index (BMI) 30.0-30.9, adult: Secondary | ICD-10-CM | POA: Diagnosis not present

## 2017-10-02 DIAGNOSIS — T45515A Adverse effect of anticoagulants, initial encounter: Secondary | ICD-10-CM

## 2017-10-02 DIAGNOSIS — Z86718 Personal history of other venous thrombosis and embolism: Secondary | ICD-10-CM | POA: Diagnosis not present

## 2017-10-02 DIAGNOSIS — I82409 Acute embolism and thrombosis of unspecified deep veins of unspecified lower extremity: Secondary | ICD-10-CM | POA: Diagnosis present

## 2017-10-02 HISTORY — DX: Other specified abnormal uterine and vaginal bleeding: N93.8

## 2017-10-02 HISTORY — DX: Antiphospholipid syndrome: D68.61

## 2017-10-02 HISTORY — DX: Anemia, unspecified: D64.9

## 2017-10-02 LAB — COMPREHENSIVE METABOLIC PANEL
ALT: 10 U/L — ABNORMAL LOW (ref 14–54)
ANION GAP: 14 (ref 5–15)
AST: 14 U/L — ABNORMAL LOW (ref 15–41)
Albumin: 3.3 g/dL — ABNORMAL LOW (ref 3.5–5.0)
Alkaline Phosphatase: 44 U/L (ref 38–126)
BUN: 7 mg/dL (ref 6–20)
CHLORIDE: 105 mmol/L (ref 101–111)
CO2: 19 mmol/L — ABNORMAL LOW (ref 22–32)
Calcium: 8.9 mg/dL (ref 8.9–10.3)
Creatinine, Ser: 0.9 mg/dL (ref 0.44–1.00)
Glucose, Bld: 132 mg/dL — ABNORMAL HIGH (ref 65–99)
POTASSIUM: 3.5 mmol/L (ref 3.5–5.1)
Sodium: 138 mmol/L (ref 135–145)
Total Bilirubin: 0.5 mg/dL (ref 0.3–1.2)
Total Protein: 7.2 g/dL (ref 6.5–8.1)

## 2017-10-02 LAB — CBC
HCT: 22.4 % — ABNORMAL LOW (ref 36.0–46.0)
HEMATOCRIT: 17.6 % — AB (ref 36.0–46.0)
Hemoglobin: 4.6 g/dL — CL (ref 12.0–15.0)
Hemoglobin: 6.2 g/dL — CL (ref 12.0–15.0)
MCH: 16.4 pg — ABNORMAL LOW (ref 26.0–34.0)
MCH: 18.2 pg — AB (ref 26.0–34.0)
MCHC: 26.1 g/dL — ABNORMAL LOW (ref 30.0–36.0)
MCHC: 27.7 g/dL — ABNORMAL LOW (ref 30.0–36.0)
MCV: 62.6 fL — AB (ref 78.0–100.0)
MCV: 65.9 fL — AB (ref 78.0–100.0)
PLATELETS: 295 10*3/uL (ref 150–400)
PLATELETS: 261 10*3/uL (ref 150–400)
RBC: 2.81 MIL/uL — AB (ref 3.87–5.11)
RBC: 3.4 MIL/uL — ABNORMAL LOW (ref 3.87–5.11)
RDW: 19.2 % — ABNORMAL HIGH (ref 11.5–15.5)
RDW: 21.9 % — AB (ref 11.5–15.5)
WBC: 9 10*3/uL (ref 4.0–10.5)
WBC: 10 10*3/uL (ref 4.0–10.5)

## 2017-10-02 LAB — PREPARE RBC (CROSSMATCH)

## 2017-10-02 LAB — I-STAT BETA HCG BLOOD, ED (MC, WL, AP ONLY): I-stat hCG, quantitative: 5.2 m[IU]/mL — ABNORMAL HIGH (ref <5)

## 2017-10-02 LAB — LIPASE, BLOOD: LIPASE: 28 U/L (ref 11–51)

## 2017-10-02 LAB — PROTIME-INR
INR: 1.97
Prothrombin Time: 22.2 seconds — ABNORMAL HIGH (ref 11.4–15.2)

## 2017-10-02 LAB — APTT: aPTT: 46 seconds — ABNORMAL HIGH (ref 24–36)

## 2017-10-02 LAB — HEMOGLOBIN A1C
HEMOGLOBIN A1C: 5.5 % (ref 4.8–5.6)
Mean Plasma Glucose: 111.15 mg/dL

## 2017-10-02 MED ORDER — MEGESTROL ACETATE 40 MG PO TABS
80.0000 mg | ORAL_TABLET | Freq: Once | ORAL | Status: DC
  Filled 2017-10-02: qty 2

## 2017-10-02 MED ORDER — ACETAMINOPHEN 650 MG RE SUPP: 650.0000 mg | Freq: Four times a day (QID) | RECTAL | Status: DC | PRN

## 2017-10-02 MED ORDER — ACETAMINOPHEN 500 MG PO TABS
1000.0000 mg | ORAL_TABLET | Freq: Once | ORAL | Status: AC
  Administered 2017-10-02: 1000 mg via ORAL
  Filled 2017-10-02: qty 2

## 2017-10-02 MED ORDER — ADULT MULTIVITAMIN W/MINERALS CH
1.0000 | ORAL_TABLET | Freq: Every day | ORAL | Status: DC
  Administered 2017-10-02 – 2017-10-03 (×2): 1 via ORAL
  Filled 2017-10-02 (×2): qty 1

## 2017-10-02 MED ORDER — SODIUM CHLORIDE 0.9 % IV SOLN
10.0000 mL/h | Freq: Once | INTRAVENOUS | Status: AC
  Administered 2017-10-02: 10 mL/h via INTRAVENOUS

## 2017-10-02 MED ORDER — RIVAROXABAN 15 MG PO TABS
15.0000 mg | ORAL_TABLET | Freq: Two times a day (BID) | ORAL | Status: DC
  Administered 2017-10-02 – 2017-10-03 (×3): 15 mg via ORAL
  Filled 2017-10-02 (×5): qty 1

## 2017-10-02 MED ORDER — RIVAROXABAN 20 MG PO TABS: 20.0000 mg | ORAL_TABLET | Freq: Every day | ORAL | Status: DC

## 2017-10-02 MED ORDER — SODIUM CHLORIDE 0.9 % IV SOLN
Freq: Once | INTRAVENOUS | Status: AC
  Administered 2017-10-02: 22:00:00 via INTRAVENOUS

## 2017-10-02 MED ORDER — SODIUM CHLORIDE 0.9 % IV SOLN
INTRAVENOUS | Status: DC
  Administered 2017-10-02: 14:00:00 via INTRAVENOUS

## 2017-10-02 MED ORDER — SODIUM CHLORIDE 0.9 % IV SOLN: Freq: Once | INTRAVENOUS | Status: DC

## 2017-10-02 MED ORDER — SENNOSIDES-DOCUSATE SODIUM 8.6-50 MG PO TABS: 1.0000 | ORAL_TABLET | Freq: Every evening | ORAL | Status: DC | PRN

## 2017-10-02 MED ORDER — MEGESTROL ACETATE 40 MG PO TABS
80.0000 mg | ORAL_TABLET | Freq: Two times a day (BID) | ORAL | Status: DC
  Administered 2017-10-02 – 2017-10-03 (×2): 80 mg via ORAL
  Filled 2017-10-02 (×4): qty 2

## 2017-10-02 MED ORDER — IBUPROFEN 400 MG PO TABS: 800.0000 mg | ORAL_TABLET | Freq: Three times a day (TID) | ORAL | Status: DC | PRN

## 2017-10-02 MED ORDER — ONDANSETRON HCL 4 MG/2ML IJ SOLN: 4.0000 mg | Freq: Three times a day (TID) | INTRAMUSCULAR | Status: DC | PRN

## 2017-10-02 MED ORDER — ACETAMINOPHEN 500 MG PO TABS
500.0000 mg | ORAL_TABLET | Freq: Once | ORAL | Status: AC
  Administered 2017-10-02: 500 mg via ORAL
  Filled 2017-10-02: qty 1

## 2017-10-02 MED ORDER — ACETAMINOPHEN 325 MG PO TABS
650.0000 mg | ORAL_TABLET | Freq: Four times a day (QID) | ORAL | Status: DC | PRN
  Administered 2017-10-02: 650 mg via ORAL
  Filled 2017-10-02: qty 2

## 2017-10-02 NOTE — H&P (Signed)
History and Physical    [name] UVO:[id number] DOB: [birth year] DOA: 10/02/2017  PCP: Venia Carbon, MD  Gynecology: Verita Schneiders, MD Hematology: Nicholas Lose, MD Consultants:  ED spoke with gyn. No formal consult Patient coming from: Home - lives with husband and daughter  Chief Complaint: weakness, dizziness  HPI: [name] is a 42 y.o. 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   Pt presents today with light headedness and dizziness since Sunday. She denies any chest pain. Has maybe noticed some shortness of breath with exertion for same amount of time. Pt reports heavy menstrual bleeding c clots since 1/17 stopping one day and beginning again. Typical menstrual cycles for her last ~10-14 days. She was seen by PCP on 09/14/17 for right calf pain and found to have a DVT. She reportedly was taking her Xarelto, but holding some days due to heavy menses. She is currently taking Xarelto 92m bid for one more week then 228mdaily rx'd.    ED Course: Pt found to have a hgb 4.6 (MCV 62.6). Only other notable lab finding is mild hyperglycemia with glucose 132.   Review of Systems: As per HPI; otherwise review of systems reviewed and negative.   Ambulatory Status: Ambulates without assistance  Past Medical History:  Diagnosis Date  . Breast cyst    BILATERAL  . DVT (deep venous thrombosis) ([facility]  . Fibroids   . Obesity     Past Surgical History:  Procedure Laterality Date  . CESAREAN SECTION    . DILITATION & CURRETTAGE/HYSTROSCOPY WITH HYDROTHERMAL ABLATION N/A 10/08/2014   Procedure: DILATATION & CURETTAGE/HYSTEROSCOPY WITH HYDROTHERMAL ABLATION;  Surgeon: UgOsborne OmanMD;  [facility];  Service: Gynecology;  Laterality: N/A;  .  MYOMECTOMY      Social History   Socioeconomic History  . Marital status: Married    Spouse name: Not on file  . Number of children: 1  . Years of education: Not on file  . Highest education level: Not on file  Social Needs  . Financial resource strain: Not on file  . Food insecurity - worry: Not on file  . Food insecurity - inability: Not on file  . Transportation needs - medical: Not on file  . Transportation needs - non-medical: Not on file  Occupational History  . 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 Smoking status: Never Smoker  . Smokeless tobacco: Never Used  Substance and Sexual Activity  . Alcohol use: Yes    Comment: rare  . Drug use: No  . Sexual activity: Yes    Partners: Male    Birth control/protection: None  Other Topics Concern  . Not on file  Social History Narrative  . Not on file    No Known Allergies  Family History  Problem Relation Age of Onset  . Diabetes Paternal Grandmother   . Heart disease Neg Hx   . Hypertension Neg Hx   . Cancer Neg Hx        breast or colon cancer    Prior to Admission medications   Medication Sig Start Date End Date Taking? Authorizing Provider  megestrol (MEGACE) 40 MG tablet Take 1 tablet (40 mg total) by mouth daily. Can increase to two tablets a day in the event of heavy bleeding 07/17/17  Yes Anyanwu, Sallyanne Havers, MD  Multiple Vitamin (MULTIVITAMIN WITH MINERALS) TABS tablet Take 1 tablet by mouth daily.   Yes [provider]  Pseudoephedrine-APAP-DM (DAYQUIL PO) Take 2 capsules by mouth as needed (cold symptoms).   Yes [provider]  Rivaroxaban (XARELTO) 15 MG TABS tablet Take 1 tablet (15 mg total) by mouth 2 (two) times daily with a meal. 09/14/17  Yes Venia Carbon, MD  XARELTO 20 MG TABS tablet TAKE 1 TABLET (20 MG TOTAL) BY MOUTH DAILY WITH SUPPER. 09/13/17   Venia Carbon, MD    Physical Exam: Vitals:   10/02/17 0918 10/02/17 0930 10/02/17  1001 10/02/17 1016  BP:  135/81 129/78 127/83  Pulse: (!) 111 (!) 105 (!) 111 (!) 111  Resp: 11 18 20 15   Temp:   98.7 F (37.1 C) 98.8 F (37.1 C)  TempSrc:   Oral Oral  SpO2: 100% 100%  100%  Weight:      Height:         General: Appears calm and comfortable and is NAD, pale-appearing Eyes:  EOMI, normal lids, iris ENT: grossly normal hearing, lips & tongue, mmm; appropriate dentition Neck: no LAD, masses or thyromegaly; no carotid bruits Cardiovascular: RRR, no m/r/g. No LE edema.  Respiratory: CTA bilaterally with no wheezes/rales/rhonchi.  Normal respiratory effort. Abdomen: soft, NT, ND, NABS Skin: no rash or induration seen on limited exam Musculoskeletal: grossly normal tone BUE/BLE, good ROM, no bony abnormality Lower extremity: No LE edema.  Limited foot exam with no ulcerations.  2+ distal pulses. Psychiatric: grossly normal mood and affect, speech fluent and appropriate, AOx3 Neurologic: CN 2-12 grossly intact, moves all extremities in coordinated fashion, sensation intact    Radiological Exams on Admission: No results found.  EKG: Independently reviewed.  NSR with rate 108bpm; no evidence of acute ischemia   Labs on Admission: I have personally reviewed the available labs and imaging studies at the time of the admission.  Pertinent labs:  hgb 4.6 (8.0 on 08/02/17) MCV 62.6 Glucose 132  Assessment/Plan Principal Problem:   Symptomatic anemia Active Problems:   DEEP VENOUS THROMBOPHLEBITIS, HX OF   DUB (dysfunctional uterine bleeding)   Recurrent deep vein thrombosis (DVT) ([facility])   Anemia    Symptomatic anemia in setting of chronic disease a/w heavy menses -microcytic, severe Fe deficiency anemia a/w ABL -baseline hgb ~8.0 -transfuse 2 units PRBCs and follow-up with hgb to determine if more needed which she likely will given she is still active  Menorrhagia  -chronic issue with uterine fibroid and on anticoagulation -has had ablation in the past with  no improvement -gyn has recommended Megace 21m bid and if no improvement then formally consult. This pt needs a more long-term solution given current issues. May need to consider IVC filter. Per pt, gyn is also considering embolization. She was rx'd Megace at visit on 07/17/17 but pt only took 2 doses before stopping  DVT, recurrent -last one in January of this year -continue Xarelto for now, but will need to reassess if bleeding not stopped with Megace. Discussed IVC filter with IR (Kathlene Cote who feels IVC it is not the best option given young age. Also risk of fibroid getting bigger and compressing the IVC. Embolization could be done electively as outpt  Hyperglycemia, mild -no hx diabetes per pt. Will check A1C  DVT prophylaxis:  On Xarelto Code Status: Full Family Communication: husband at bedside on exam Disposition Plan: Home once clinically improved Consults called: discussed pt with  IR. No formal consultation Admission status: observation    Patrici Ranks, NP-C [place]  pgr [phone]   If note is complete, please contact covering daytime or nighttime physician. [email or web address] Password [email or web address]  10/02/2017, 10:28 AM

## 2017-10-02 NOTE — ED Notes (Signed)
White stickers used for blood collection

## 2017-10-02 NOTE — ED Notes (Addendum)
iStat beta hCG result 5.2

## 2017-10-02 NOTE — Progress Notes (Signed)
Patient received from ED with hemoglobin 6.2. Text paged NP Baltazar Najjar, Awaiting response.

## 2017-10-02 NOTE — ED Notes (Signed)
Pharmacy called about xarelto orders.

## 2017-10-02 NOTE — Progress Notes (Signed)
Orders received for 3 units to blood to be transfused.

## 2017-10-02 NOTE — ED Notes (Signed)
Pt ambulating to bathroom. Steady gait no SOB noted.

## 2017-10-02 NOTE — ED Triage Notes (Signed)
Pt reports feeling weakness and fatigue  that stared over the weekend and became worse yesterday when  She felt she may pass out.   Hx of amenia. Pt appears pale. And reports longer than usual menstrual period.   Pt also takes blood thinner for diagnosed DVT.  Sob with walking.

## 2017-10-02 NOTE — ED Provider Notes (Signed)
[facility] DEPARTMENT Provider Note   CSN: [id number] Arrival date & time: 10/02/17  5809     History   Chief Complaint Chief Complaint  Patient presents with  . Weakness    HPI [name] is a 42 y.o. female.  Patient c/o feeling faint, lightheaded when stands. Recent dx right leg dvt in past month, is on xarelto. Leg is improving, becoming less swollen. No chest pain or sob. Pt started on her period 09/13/2017 - has hx prolonged periods at times, hx fibroids. Bleeding last a week, almost resolved, but then returned. Is going through pad every 2-3 hours. In past few days, has felt weaker, faint when standing. Pt w hx chronic anemia. Was on fe po but states it didn't help. Has received iron infusion in past. Denies other abnormal bruising or bleeding. No melena or rectal bleeding. No nosebleeds. No hematuria.    The history is provided by the patient.  Weakness  Pertinent negatives include no shortness of breath, no chest pain, no vomiting and no confusion.    Past Medical History:  Diagnosis Date  . Breast cyst    BILATERAL  . DVT (deep venous thrombosis) ([place])   . Fibroids   . Obesity     Patient Active Problem List   Diagnosis Date Noted  . Acute non-recurrent maxillary sinusitis 09/14/2017  . Anemia 03/09/2017  . Preventative health care 03/08/2017  . Acute blood loss anemia 10/06/2014  . Recurrent deep vein thrombosis (DVT) ([place]) 09/30/2014  . Infertility, female 07/08/2012  . Fibroids 06/17/2012  . DUB (dysfunctional uterine bleeding) 06/14/2012  . DEEP VENOUS THROMBOPHLEBITIS, HX OF 04/10/2007    Past Surgical History:  Procedure Laterality Date  . CESAREAN SECTION    . DILITATION & CURRETTAGE/HYSTROSCOPY WITH HYDROTHERMAL ABLATION N/A 10/08/2014   Procedure: DILATATION & CURETTAGE/HYSTEROSCOPY WITH HYDROTHERMAL ABLATION;  Surgeon: Osborne Oman, MD;  Location: [facility];  Service: Gynecology;  Laterality: N/A;  .  MYOMECTOMY      OB History    Gravida Para Term Preterm AB Living   1 1 0 0 0 1   SAB TAB Ectopic Multiple Live Births   0 0 0 0 1       Home Medications    Prior to Admission medications   Medication Sig Start Date End Date Taking? Authorizing Provider  ibuprofen (ADVIL,MOTRIN) 200 MG tablet Take 400 mg by mouth every 6 (six) hours as needed for fever, headache, mild pain, moderate pain or cramping.    [provider]  megestrol (MEGACE) 40 MG tablet Take 1 tablet (40 mg total) by mouth daily. Can increase to two tablets a day in the event of heavy bleeding 07/17/17   Anyanwu, Sallyanne Havers, MD  Rivaroxaban (XARELTO) 15 MG TABS tablet Take 1 tablet (15 mg total) by mouth 2 (two) times daily with a meal. 09/14/17   Venia Carbon, MD  XARELTO 20 MG TABS tablet TAKE 1 TABLET (20 MG TOTAL) BY MOUTH DAILY WITH SUPPER. 09/13/17   Venia Carbon, MD    Family History Family History  Problem Relation Age of Onset  . Diabetes Paternal Grandmother   . Heart disease Neg Hx   . Hypertension Neg Hx   . Cancer Neg Hx        breast or colon cancer    Social History Social History   Tobacco Use  . Smoking status: Never Smoker  . Smokeless tobacco: Never Used  Substance Use Topics  .  Alcohol use: Yes    Comment: rare  . Drug use: No     Allergies   Patient has no known allergies.   Review of Systems Review of Systems  Constitutional: Negative for fever.  HENT: Negative for nosebleeds.   Eyes: Negative for redness.  Respiratory: Negative for shortness of breath.   Cardiovascular: Negative for chest pain.  Gastrointestinal: Negative for abdominal pain, blood in stool and vomiting.  Genitourinary: Negative for flank pain and hematuria.  Musculoskeletal: Negative for back pain and neck pain.  Skin: Negative for rash.  Neurological: Positive for weakness and light-headedness.  Hematological:       On xarelto.   Psychiatric/Behavioral: Negative for confusion.      Physical Exam Updated Vital Signs BP (!) 142/84   Pulse (!) 111   Temp 98.5 F (36.9 C) (Oral)   Resp 11   Ht 1.753 m (5' 9" )   Wt 94.3 kg (208 lb)   LMP 09/13/2017   SpO2 100%   BMI 30.72 kg/m   Physical Exam   ED Treatments / Results  Labs (all labs ordered are listed, but only abnormal results are displayed) Results for orders placed or performed during the hospital encounter of 10/02/17  CBC  Result Value Ref Range   WBC 9.0 4.0 - 10.5 K/uL   RBC 2.81 (L) 3.87 - 5.11 MIL/uL   Hemoglobin 4.6 (LL) 12.0 - 15.0 g/dL   HCT 17.6 (L) 36.0 - 46.0 %   MCV 62.6 (L) 78.0 - 100.0 fL   MCH 16.4 (L) 26.0 - 34.0 pg   MCHC 26.1 (L) 30.0 - 36.0 g/dL   RDW 19.2 (H) 11.5 - 15.5 %   Platelets 295 150 - 400 K/uL  Type and screen [place]  Result Value Ref Range   ABO/RH(D) B POS    Antibody Screen NEG    Sample Expiration 10/05/2017    Unit Number [id number]    Blood Component Type RED CELLS,LR    Unit division 00    Status of Unit ALLOCATED    Transfusion Status OK TO TRANSFUSE    Crossmatch Result Compatible    Unit Number [id number]    Blood Component Type RED CELLS,LR    Unit division 00    Status of Unit ISSUED    Transfusion Status OK TO TRANSFUSE    Crossmatch Result      Compatible Performed at [facility], [address] [place], [place] [address]   Prepare RBC  Result Value Ref Range   Order Confirmation      ORDER PROCESSED BY BLOOD BANK Performed at [facility], [place] [address], [place], [place] [address]   BPAM [facility]  Result Value Ref Range   Blood Product Unit Number [id number]    Unit Type and Rh 7300    Blood Product Expiration Date 542706237628    ISSUE DATE / TIME [id number]    Blood Product Unit Number [id number]    PRODUCT CODE [id number]    Unit Type and Rh 7300    Blood Product Expiration Date [id number]    US Breast Ltd Uni Right Inc Axilla  Result Date: 09/17/2017 CLINICAL DATA:   42 year old female for further evaluation of possible right breast mass on screening mammogram. EXAM: 2D DIGITAL DIAGNOSTIC RIGHT MAMMOGRAM WITH CAD AND ADJUNCT TOMO ULTRASOUND RIGHT BREAST COMPARISON:  Previous exam(s). ACR Breast Density Category b: There are scattered areas of fibroglandular density. FINDINGS: 2D and 3D full field  views of the right breast demonstrate a 1.4 cm circumscribed oval mass in the slightly upper right breast. No suspicious calcifications are identified. Mammographic images were processed with CAD. Targeted ultrasound is performed, showing a 1.4 x 1.1 x 1.2 cm benign minimally complicated cyst at the 89:38 position of the right breast 1 cm from the nipple, corresponding to the screening study finding. IMPRESSION: Benign cyst within the slightly upper outer right breast corresponding to the screening study finding. RECOMMENDATION: Bilateral screening mammograms in 1 year. I have discussed the findings and recommendations with the patient. Results were also provided in writing at the conclusion of the visit. If applicable, a reminder letter will be sent to the patient regarding the next appointment. BI-RADS CATEGORY  2: Benign. Electronically Signed   By: Margarette Canada M.D.   On: 09/17/2017 10:27   Mm Diag Breast Tomo Uni Right  Result Date: 09/17/2017 CLINICAL DATA:  42 year old female for further evaluation of possible right breast mass on screening mammogram. EXAM: 2D DIGITAL DIAGNOSTIC RIGHT MAMMOGRAM WITH CAD AND ADJUNCT TOMO ULTRASOUND RIGHT BREAST COMPARISON:  Previous exam(s). ACR Breast Density Category b: There are scattered areas of fibroglandular density. FINDINGS: 2D and 3D full field views of the right breast demonstrate a 1.4 cm circumscribed oval mass in the slightly upper right breast. No suspicious calcifications are identified. Mammographic images were processed with CAD. Targeted ultrasound is performed, showing a 1.4 x 1.1 x 1.2 cm benign minimally complicated cyst  at the 11:30 position of the right breast 1 cm from the nipple, corresponding to the screening study finding. IMPRESSION: Benign cyst within the slightly upper outer right breast corresponding to the screening study finding. RECOMMENDATION: Bilateral screening mammograms in 1 year. I have discussed the findings and recommendations with the patient. Results were also provided in writing at the conclusion of the visit. If applicable, a reminder letter will be sent to the patient regarding the next appointment. BI-RADS CATEGORY  2: Benign. Electronically Signed   By: Margarette Canada M.D.   On: 09/17/2017 10:27   Vas [place] Lower Extremity Venous (dvt)  Result Date: 09/17/2017  Lower Venous DVT Study Indication: Pain and Swelling. Patient has a h/o bilateral DVT in the lower extremities. She presents today with complaints for right lower extremity pain that started at the ankle now in the thigh. Painful when walking. Denies SOB. 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 Acute DVT in the left CFV, SFJ, DFV, entire FV, popliteal vein and proximal to mid GSV. Risk Factors: DVT Prior on 09/28/2014. Anticoagulation: Xarelto.  Examination Guidelines: A complete evaluation includes B-mode imaging, spectral doppler, color doppler, and power doppler as needed of all accessible portions of each vessel. Bilateral testing is considered an integral part of a complete examination. Limited examinations for reoccurring indications may be performed as noted. The reflux portion of the exam is performed with the patient in reverse Trendelenburg.  Right Venous Findings: +---------+---------------+---------+-----------+------------------+-------+          CompressibilityPhasicitySpontaneityProperties        Summary +---------+---------------+---------+-----------+------------------+-------+ CFV      Partial                            brightly echogenic         +---------+---------------+---------+-----------+------------------+-------+ FV Prox  None  softly echogenic          +---------+---------------+---------+-----------+------------------+-------+ FV Mid   None                               softly echogenic          +---------+---------------+---------+-----------+------------------+-------+ FV DistalNone                               softly echogenic          +---------+---------------+---------+-----------+------------------+-------+ PFV      None                                                         +---------+---------------+---------+-----------+------------------+-------+ POP      None                                                         +---------+---------------+---------+-----------+------------------+-------+ PTV      None                                                         +---------+---------------+---------+-----------+------------------+-------+ PERO     None                                                         +---------+---------------+---------+-----------+------------------+-------+ GSV      Partial                                                      +---------+---------------+---------+-----------+------------------+-------+ The CFV and SFJ are partially compressible, non occlusive, with echogenic "web like" thrombus. Chronic, non occlusive, thombus in the proximal GSV just beyond the SFJ. Acute, occlusive, thrombus is seen in the PFV, entire FV and above-the-knee popliteal vein. No intraluminal thrombus noted in the behind-the-knee or below-the-knee popliteal vein. The gastrocnemius veins are free of thrombus. Acute, occlusive, thrombus is seen in one of the paired peroneal veins. There is also acute, occlusive, thrombus noted in the one of the pair PTV's in the proximal segment.  Right Technical Findings: Probable enlarged lymph node in the right  groin/upper thigh area adjacent to the CFV, measuring 3.3 x .75 x 1.1 cm.   Left Venous Findings: +---+---------------+---------+-----------+------------------+-------+    CompressibilityPhasicitySpontaneityProperties        Summary +---+---------------+---------+-----------+------------------+-------+ CFVPartial        Yes      Yes        brightly echogenic        +---+---------------+---------+-----------+------------------+-------+ Chronic, non occlusive, thrombus in the left CFV to SFJ.  Findings reported to Dr Delfino Lovett  [place] at 1:00 pm.  Final Interpretation: Right: There is evidence of acute DVT in the femoral vein, proximal profunda vein, popliteal vein, posterior Tibial veins, and peroneal veins. Abnormalities consistent with the sequela of a prior venous obstructive process, with findings that appear chronic/long standing in nature are identified in the common femoral vein, saphenofemoral junction and proximal great saphenous vein just beyond the saphenofemoral junction.  Left: Abnormalities consistent with the sequela of a prior venous obstructive process, with findings that appear chronic/long standing in nature are identified in the common femoral vein and saphenofemoral junction.  *See table(s) above for measurements and observations. Electronically signed by Carlyle Dolly on 09/17/2017 at 4:41:01 PM.     EKG  EKG Interpretation  Date/Time:  Tuesday October 02 2017 08:06:35 EST Ventricular Rate:  108 PR Interval:  144 QRS Duration: 74 QT Interval:  340 QTC Calculation: 455 R Axis:   56 Text Interpretation:  Sinus tachycardia Otherwise normal ECG Confirmed by Lajean Saver [phone]) on 10/02/2017 9:29:11 AM       Radiology No results found.  Procedures Procedures (including critical care time)  Medications Ordered in ED Medications  0.9 %  sodium chloride infusion (not administered)     Initial Impression / Assessment and Plan / ED Course  I have reviewed the  triage vital signs and the nursing notes.  Pertinent labs & imaging results that were available during my care of the patient were reviewed by me and considered in my medical decision making (see chart for details).  Labs.  hgb 4.8  Two large bore ivs.   Transfusion prbc.  Hospitalists consulted for admission.   Reviewed nursing notes and prior charts for additional history.   Ob/gyn consulted. Discussed pt with Dr Nehemiah Settle, including xarelto for dvt, prolonged bleeding, hgb today - he recommends starting on Megace 80 bid, and states can f/u with them as outpt.  States if megace dose fails to improve bleeding in hospital, that admitting team can re-consult them.   Hospitalists consulted for admission.   CRITICAL CARE  Presyncope/severe symptomatic anemia with ongoing gyn bleeding, with tachycardia, orthostasis, on anticoag therapy (xarelto) for recent acute dvt Performed by: Mirna Mires Total critical care time: 40 minutes Critical care time was exclusive of separately billable procedures and treating other patients. Critical care was necessary to treat or prevent imminent or life-threatening deterioration. Critical care was time spent personally by me on the following activities: development of treatment plan with patient and/or surrogate as well as nursing, discussions with consultants, evaluation of patient's response to treatment, examination of patient, obtaining history from patient or surrogate, ordering and performing treatments and interventions, ordering and review of laboratory studies, ordering and review of radiographic studies, pulse oximetry and re-evaluation of patient's condition.      Final Clinical Impressions(s) / ED Diagnoses   Final diagnoses:  None    ED Discharge Orders    None       Lajean Saver, MD 10/02/17 [phone]

## 2017-10-02 NOTE — ED Notes (Signed)
Meal tray to the bedside.

## 2017-10-03 ENCOUNTER — Encounter (HOSPITAL_COMMUNITY): Payer: Self-pay | Admitting: General Practice

## 2017-10-03 ENCOUNTER — Other Ambulatory Visit: Payer: Self-pay

## 2017-10-03 DIAGNOSIS — N938 Other specified abnormal uterine and vaginal bleeding: Secondary | ICD-10-CM

## 2017-10-03 DIAGNOSIS — D649 Anemia, unspecified: Secondary | ICD-10-CM | POA: Diagnosis not present

## 2017-10-03 DIAGNOSIS — Z86718 Personal history of other venous thrombosis and embolism: Secondary | ICD-10-CM

## 2017-10-03 DIAGNOSIS — Z8672 Personal history of thrombophlebitis: Secondary | ICD-10-CM

## 2017-10-03 LAB — CBC
HEMATOCRIT: 21.8 % — AB (ref 36.0–46.0)
Hemoglobin: 6.2 g/dL — CL (ref 12.0–15.0)
MCH: 18.8 pg — ABNORMAL LOW (ref 26.0–34.0)
MCHC: 28.4 g/dL — AB (ref 30.0–36.0)
MCV: 66.3 fL — AB (ref 78.0–100.0)
PLATELETS: 273 10*3/uL (ref 150–400)
RBC: 3.29 MIL/uL — ABNORMAL LOW (ref 3.87–5.11)
RDW: 21.4 % — ABNORMAL HIGH (ref 11.5–15.5)
WBC: 10.4 10*3/uL (ref 4.0–10.5)

## 2017-10-03 LAB — HEMOGLOBIN AND HEMATOCRIT, BLOOD
HCT: 28.4 % — ABNORMAL LOW (ref 36.0–46.0)
HCT: 28.1 % — ABNORMAL LOW (ref 36.0–46.0)
Hemoglobin: 8.7 g/dL — ABNORMAL LOW (ref 12.0–15.0)
Hemoglobin: 8.8 g/dL — ABNORMAL LOW (ref 12.0–15.0)

## 2017-10-03 LAB — HIV ANTIBODY (ROUTINE TESTING W REFLEX): HIV SCREEN 4TH GENERATION: NONREACTIVE

## 2017-10-03 MED ORDER — MEGESTROL ACETATE 40 MG PO TABS: 80.0000 mg | ORAL_TABLET | Freq: Two times a day (BID) | ORAL | 0 refills | Status: DC

## 2017-10-03 MED ORDER — IRON 325 (65 FE) MG PO TABS: 1.0000 | ORAL_TABLET | Freq: Every day | ORAL | 0 refills | Status: DC

## 2017-10-03 NOTE — Progress Notes (Signed)
PROGRESS NOTE    [name]  CWC:[id number] DOB: 1975/11/24 DOA: 10/02/2017 PCP: Venia Carbon, MD   Outpatient Specialists:     Brief Narrative:  [name] is a 42 y.o. 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   Pt presents with light headedness and dizziness since Sunday. She denies any chest pain. Has maybe noticed some shortness of breath with exertion for same amount of time. Pt reports heavy menstrual bleeding c clots since 1/17 stopping one day and beginning again. Typical menstrual cycles for her last ~10-14 days. She was seen by PCP on 09/14/17 for right calf pain and found to have a DVT. She reportedly was taking her Xarelto, but holding some days due to heavy menses. She is currently taking Xarelto 44m bid for one more week then 25mdaily rx'd     Assessment & Plan:   Principal Problem:   Symptomatic anemia Active Problems:   DEEP VENOUS THROMBOPHLEBITIS, HX OF   DUB (dysfunctional uterine bleeding)   Recurrent deep vein thrombosis (DVT) ([facility])   Anemia   Symptomatic anemia -s/p 3 units -H/H shows 8.7 -trend hgb- will recheck this PM-- if < 8 and still bleeding, will transfuse and get formal GYN consult -recheck CBC in AM-- if stable plan to d/c -Her usual Hgb goal would be >7 but since she is continuing to bleed would consider a goal closer to 8-9. -Patient counseled about short- and long-term risks associated with transfusion and consents to receive blood products.  -last IV Fe was in October  Dysfunctional uterine bleeding - known uterine fibroid - had endometrial ablation in 2016 -has outpatient appointment scheduled for Monday with GYN -She was previously prescribed Megace and did not feel comfortable taking this  medication; she is now willing to take it and is being started on a higher dose as per GYN -She will need close outpatient f/u with plan for intervention soon in order to prevent the need for repeated transfusions  VTE -Patient with prior episode of thromboembolic disease and antiphospholipid syndrome presenting with recent recurrent DVT for which she has been started on Xarelto -She previously took Coumadin with her last clot -This is a difficult situation given her hypercoagulability and DUB -[facility]follow with Dr. GuLindi Adie     DVT prophylaxis:  Fully anticoagulated   Code Status: Full Code   Family Communication:   Disposition Plan:     Consultants:      Subjective: No longer dizzy Still with clots  Objective: Vitals:   10/03/17 0307 10/03/17 0545 10/03/17 0615 10/03/17 0948  BP: 131/80 117/78 125/83 125/74  Pulse: 100 100 95 100  Resp: 18 18  18   Temp: 99.1 F (37.3 C) 98.1 F (36.7 C) 98.3 F (36.8 C) 98.6 F (37 C)  TempSrc: Oral Oral Oral Oral  SpO2: 100% 99% 97% 100%  Weight:      Height:        Intake/Output Summary (Last 24 hours) at 10/03/2017 1251 Last data filed at 10/03/2017 0948 Gross per 24 hour  Intake 2331.5 ml  Output -  Net 2331.5 ml   Filed Weights   10/02/17 0803  Weight: 94.3 kg (208 lb)    Examination:  General exam: Appears calm and comfortable  Respiratory system: Clear to auscultation. Respiratory effort  normal. Cardiovascular system: S1 & S2 heard, RRR. No JVD, murmurs, rubs, gallops or clicks. No pedal edema. Gastrointestinal system: Abdomen is nondistended, soft and nontender. No organomegaly or masses felt. Normal bowel sounds heard. Central nervous system: Alert and oriented. No focal neurological deficits. Extremities: Symmetric 5 x 5 power. Skin: No rashes, lesions or ulcers Psychiatry: Judgement and insight appear normal. Mood & affect appropriate.     Data Reviewed: I  have personally reviewed following labs and imaging studies  CBC: Recent Labs  Lab 10/02/17 0814 10/02/17 1717 10/03/17 0152 10/03/17 1130  WBC 9.0 10.0 10.4  --   HGB 4.6* 6.2* 6.2* 8.7*  HCT 17.6* 22.4* 21.8* 28.4*  MCV 62.6* 65.9* 66.3*  --   PLT 295 261 273  --    Basic Metabolic Panel: Recent Labs  Lab 10/02/17 0814  NA 138  K 3.5  CL 105  CO2 19*  GLUCOSE 132*  BUN 7  CREATININE 0.90  CALCIUM 8.9   GFR: Estimated Creatinine Clearance: 99.5 mL/min (by C-G formula based on SCr of 0.9 mg/dL). Liver Function Tests: Recent Labs  Lab 10/02/17 0814  AST 14*  ALT 10*  ALKPHOS 44  BILITOT 0.5  PROT 7.2  ALBUMIN 3.3*   Recent Labs  Lab 10/02/17 0814  LIPASE 28   No results for input(s): AMMONIA in the last 168 hours. Coagulation Profile: Recent Labs  Lab 10/02/17 2204  INR 1.97   Cardiac Enzymes: No results for input(s): CKTOTAL, CKMB, CKMBINDEX, TROPONINI in the last 168 hours. BNP (last 3 results) No results for input(s): PROBNP in the last 8760 hours. HbA1C: Recent Labs    10/02/17 1717  HGBA1C 5.5   CBG: No results for input(s): GLUCAP in the last 168 hours. Lipid Profile: No results for input(s): CHOL, HDL, LDLCALC, TRIG, CHOLHDL, LDLDIRECT in the last 72 hours. Thyroid Function Tests: No results for input(s): TSH, T4TOTAL, FREET4, T3FREE, THYROIDAB in the last 72 hours. Anemia Panel: No results for input(s): VITAMINB12, FOLATE, FERRITIN, TIBC, IRON, RETICCTPCT in the last 72 hours. Urine analysis:    Component Value Date/Time   COLORURINE YELLOW 08/02/2017 1122   APPEARANCEUR CLEAR 08/02/2017 1122   LABSPEC 1.024 08/02/2017 1122   PHURINE 6.0 08/02/2017 1122   GLUCOSEU NEGATIVE 08/02/2017 1122   HGBUR LARGE (A) 08/02/2017 1122   BILIRUBINUR NEGATIVE 08/02/2017 1122   KETONESUR NEGATIVE 08/02/2017 1122   PROTEINUR NEGATIVE 08/02/2017 1122   UROBILINOGEN 1.0 04/23/2007 0552   NITRITE NEGATIVE 08/02/2017 1122   LEUKOCYTESUR NEGATIVE  08/02/2017 1122     )No results found for this or any previous visit (from the past 240 hour(s)).    Anti-infectives (From admission, onward)   None       Radiology Studies: No results found.      Scheduled Meds: . megestrol  80 mg Oral BID  . multivitamin with minerals  1 tablet Oral Daily  . Rivaroxaban  15 mg Oral BID WC  . [START ON 10/05/2017] rivaroxaban  20 mg Oral Q supper   Continuous Infusions: . sodium chloride       LOS: 0 days    Time spent: 25 min    Geradine Girt, DO [facility] Pager [phone]  If 7PM-7AM, please contact night-coverage [email or web address] Password [email or web address] 10/03/2017, 12:51 PM

## 2017-10-03 NOTE — Discharge Summary (Signed)
Physician Discharge Summary  [name] LSL:[id number] DOB: 03-22-1976 DOA: 10/02/2017  PCP: Summer Carbon, MD  Admit date: 10/02/2017 Discharge date: 10/03/2017   Recommendations for Outpatient Follow-Up:   1. Has appointment with [name] 2/11   Discharge Diagnosis:   Principal Problem:   Symptomatic anemia Active Problems:   DEEP VENOUS THROMBOPHLEBITIS, HX OF   DUB (dysfunctional uterine bleeding)   Recurrent deep vein thrombosis (DVT) ([place])   Anemia   Discharge disposition:  Home.  SNF:  Discharge Condition: Improved.  Diet recommendation: Regular.  Wound care: None.   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   Pt presents with light headedness and dizziness since Sunday. She denies any chest pain. Has maybe noticed some shortness of breath with exertion for same amount of time. Pt reports heavy menstrual bleeding c clots since 1/17 stopping one day and beginning again. Typical menstrual cycles for her last ~10-14 days. She was seen by PCP on 09/14/17 for right calf pain and found to have a DVT. She reportedly was taking her Xarelto, but holding some days due to heavy menses. She is currently taking Xarelto 56m bid for one more week then 24mdaily rx'd     Hospital Course by Problem:   Symptomatic anemia -s/p 3 units -H/H shows 8.7 with repeat even higher-- no longer symptomatic -Patient counseled about short- and long-term risks associated with transfusion and consents to receive blood products. -last IV Fe was in October  Dysfunctional uterine bleeding - known uterine fibroid - had endometrial ablation in 2016 -has outpatient appointment scheduled for Monday with  [name] -She was previously prescribed Megace and did not feel comfortable taking this medication; she is now willing to take it and is being started on a higher dose as per [name] -She will need close outpatient f/u with plan for intervention soon in order to prevent the need for repeated transfusions  VTE -Patient with prior episode of thromboembolic disease and antiphospholipid syndrome presenting with recent recurrent DVT for which she has been started on Xarelto -She previously took Coumadin with her last clot -This is a difficult situation given her hypercoagulability and DUB -[facility] -follow with [name]      Medical Consultants:    None.   Discharge Exam:   Vitals:   10/03/17 0948 10/03/17 1427  BP: 125/74 122/86  Pulse: 100 (!) 101  Resp: 18 20  Temp: 98.6 F (37 C) 99.3 F (37.4 C)  SpO2: 100% 100%   Vitals:   10/03/17 0545 10/03/17 0615 10/03/17 0948 10/03/17 1427  BP: 117/78 125/83 125/74 122/86  Pulse: 100 95 100 (!) 101  Resp: 18  18 20   Temp: 98.1 F (36.7 C) 98.3 F (36.8 C) 98.6 F (37 C) 99.3 F (37.4 C)  TempSrc: Oral Oral Oral Oral  SpO2: 99% 97% 100% 100%  Weight:      Height:        Gen:  NAD-anxious to go home   The results of significant diagnostics from this hospitalization (including imaging, microbiology, ancillary and laboratory) are listed below for reference.     Procedures and Diagnostic Studies:   No results found.   Labs:   Basic Metabolic Panel: Recent Labs  Lab 10/02/17 [id number]  NA 138  K 3.5  CL 105  CO2 19*  GLUCOSE 132*  BUN 7  CREATININE 0.90  CALCIUM 8.9   GFR Estimated Creatinine Clearance: 99.5 mL/min (by C-G formula based on SCr of 0.9 mg/dL). Liver Function Tests: Recent Labs  Lab 10/02/17 0814  AST 14*  ALT 10*  ALKPHOS 44  BILITOT 0.5  PROT 7.2  ALBUMIN 3.3*   Recent Labs  Lab 10/02/17 0814  LIPASE 28   No results for input(s): AMMONIA in the  last 168 hours. Coagulation profile Recent Labs  Lab 10/02/17 2204  INR 1.97    CBC: Recent Labs  Lab 10/02/17 0814 10/02/17 1717 10/03/17 0152 10/03/17 1130 10/03/17 1542  WBC 9.0 10.0 10.4  --   --   HGB 4.6* 6.2* 6.2* 8.7* 8.8*  HCT 17.6* 22.4* 21.8* 28.4* 28.1*  MCV 62.6* 65.9* 66.3*  --   --   PLT 295 261 273  --   --    Cardiac Enzymes: No results for input(s): CKTOTAL, CKMB, CKMBINDEX, TROPONINI in the last 168 hours. BNP: Invalid input(s): POCBNP CBG: No results for input(s): GLUCAP in the last 168 hours. D-Dimer No results for input(s): DDIMER in the last 72 hours. Hgb A1c Recent Labs    10/02/17 1717  HGBA1C 5.5   Lipid Profile No results for input(s): CHOL, HDL, LDLCALC, TRIG, CHOLHDL, LDLDIRECT in the last 72 hours. Thyroid function studies No results for input(s): TSH, T4TOTAL, T3FREE, THYROIDAB in the last 72 hours.  Invalid input(s): FREET3 Anemia work up No results for input(s): VITAMINB12, FOLATE, FERRITIN, TIBC, IRON, RETICCTPCT in the last 72 hours. Microbiology No results found for this or any previous visit (from the past 240 hour(s)).   Discharge Instructions:   Discharge Instructions    Discharge instructions   Complete by:  As directed    Return to ER for worsening symptoms Keep appointment with [name] on Monday Make appointment with [name] as you could most likely benefit from an IV iron infusion   Increase activity slowly   Complete by:  As directed      Allergies as of 10/03/2017   No Known Allergies     Medication List    STOP taking these medications   DAYQUIL PO     TAKE these medications   Iron 325 (65 Fe) MG Tabs Take 1 tablet (325 mg total) by mouth daily.   megestrol 40 MG tablet Commonly known as:  MEGACE Take 2 tablets (80 mg total) by mouth 2 (two) times daily. What changed:    how much to take  when to take this  additional instructions   multivitamin with minerals Tabs tablet Take 1 tablet by  mouth daily.   XARELTO 20 MG Tabs tablet Generic drug:  rivaroxaban TAKE 1 TABLET (20 MG TOTAL) BY MOUTH DAILY WITH SUPPER.   Rivaroxaban 15 MG Tabs tablet Commonly known as:  XARELTO Take 1 tablet (15 mg total) by mouth 2 (two) times daily with a meal.      Follow-up Information    Summer Simpler I, MD Follow up in 1 week(s).   Specialties:  Internal Medicine, Pediatrics Why:  cbc Contact information: [place] [address] [phone]        Osborne Oman, MD Follow up.   Specialty:  Obstetrics and Gynecology Why:  keep appointment for Monday Contact information: [place] [address] [phone]  Time coordinating discharge: 35 min  Signed:  Geradine Girt   [facility] 10/03/2017, 4:41 PM

## 2017-10-03 NOTE — Progress Notes (Signed)
Patient wants CBC drawn prior to third blood transfusion. NP Baltazar Najjar paged. Awaiting response

## 2017-10-03 NOTE — Progress Notes (Signed)
Call Back received from NP [facility]. NP wants patient to receive third unit since patient is actively bleeding vaginally . Patient informed and patient ok with third unit

## 2017-10-04 LAB — BPAM RBC
BLOOD PRODUCT EXPIRATION DATE: 201902222359
BLOOD PRODUCT EXPIRATION DATE: 201902282359
BLOOD PRODUCT EXPIRATION DATE: 201903022359
Blood Product Expiration Date: 201902232359
Blood Product Expiration Date: 201902242359
Blood Product Expiration Date: 201902282359
Blood Product Expiration Date: 201903022359
ISSUE DATE / TIME: 201902050932
ISSUE DATE / TIME: 201902052303
ISSUE DATE / TIME: 201902060557
ISSUE DATE / TIME: 201902051404
ISSUE DATE / TIME: 201902060234
UNIT TYPE AND RH: 7300
UNIT TYPE AND RH: 7300
Unit Type and Rh: 7300
Unit Type and Rh: 7300
Unit Type and Rh: 7300
Unit Type and Rh: 7300
Unit Type and Rh: 7300

## 2017-10-04 LAB — TYPE AND SCREEN
ABO/RH(D): B POS
ANTIBODY SCREEN: NEGATIVE
UNIT DIVISION: 0
UNIT DIVISION: 0
Unit division: 0
Unit division: 0
Unit division: 0
Unit division: 0
Unit division: 0

## 2017-10-04 LAB — BETA HCG QUANT (REF LAB)

## 2017-10-05 ENCOUNTER — Telehealth: Payer: Self-pay

## 2017-10-05 ENCOUNTER — Other Ambulatory Visit: Payer: Self-pay

## 2017-10-05 DIAGNOSIS — D508 Other iron deficiency anemias: Secondary | ICD-10-CM

## 2017-10-05 NOTE — Telephone Encounter (Addendum)
Pt called to request an appt with Dr.Gudena and iron infusion appt next week. Pt was recently released from the hospital for severe anemia. Pt received a total of 3 units of blood transfusion and was told that she would need to contact her hematologist for an iron infusion next week.   Scheduled pt for LABS/MD/IRON INF appt on 10/08/17. Pt states that she was passing out prior to hospital admission and was treated with several blood transfusions since.   Pt currently takes oral iron supplement and is on xarelto. Pt confirmed time/date of appt and has no further questions.  Sent scheduling message to add iron infusion appt for pt on Monday 10/08/17.

## 2017-10-08 ENCOUNTER — Inpatient Hospital Stay: Payer: BC Managed Care – PPO

## 2017-10-08 ENCOUNTER — Other Ambulatory Visit: Payer: Self-pay | Admitting: Obstetrics & Gynecology

## 2017-10-08 ENCOUNTER — Ambulatory Visit (INDEPENDENT_AMBULATORY_CARE_PROVIDER_SITE_OTHER): Payer: BC Managed Care – PPO | Admitting: Obstetrics & Gynecology

## 2017-10-08 ENCOUNTER — Telehealth: Payer: Self-pay | Admitting: Hematology and Oncology

## 2017-10-08 ENCOUNTER — Encounter: Payer: Self-pay | Admitting: Obstetrics & Gynecology

## 2017-10-08 ENCOUNTER — Inpatient Hospital Stay: Payer: BC Managed Care – PPO | Attending: Hematology and Oncology | Admitting: Hematology and Oncology

## 2017-10-08 VITALS — BP 132/82 | HR 116 | Temp 98.6°F | Resp 18 | Ht 69.0 in | Wt 203.8 lb

## 2017-10-08 VITALS — BP 131/84 | HR 71 | Wt 203.5 lb

## 2017-10-08 DIAGNOSIS — D508 Other iron deficiency anemias: Secondary | ICD-10-CM

## 2017-10-08 DIAGNOSIS — Z8672 Personal history of thrombophlebitis: Secondary | ICD-10-CM

## 2017-10-08 DIAGNOSIS — Z79899 Other long term (current) drug therapy: Secondary | ICD-10-CM | POA: Insufficient documentation

## 2017-10-08 DIAGNOSIS — I82409 Acute embolism and thrombosis of unspecified deep veins of unspecified lower extremity: Secondary | ICD-10-CM

## 2017-10-08 DIAGNOSIS — D219 Benign neoplasm of connective and other soft tissue, unspecified: Secondary | ICD-10-CM

## 2017-10-08 DIAGNOSIS — Z7901 Long term (current) use of anticoagulants: Secondary | ICD-10-CM | POA: Insufficient documentation

## 2017-10-08 DIAGNOSIS — D250 Submucous leiomyoma of uterus: Secondary | ICD-10-CM

## 2017-10-08 DIAGNOSIS — D62 Acute posthemorrhagic anemia: Secondary | ICD-10-CM

## 2017-10-08 DIAGNOSIS — N938 Other specified abnormal uterine and vaginal bleeding: Secondary | ICD-10-CM

## 2017-10-08 DIAGNOSIS — N920 Excessive and frequent menstruation with regular cycle: Secondary | ICD-10-CM

## 2017-10-08 DIAGNOSIS — R5383 Other fatigue: Secondary | ICD-10-CM | POA: Diagnosis not present

## 2017-10-08 DIAGNOSIS — D500 Iron deficiency anemia secondary to blood loss (chronic): Secondary | ICD-10-CM | POA: Diagnosis not present

## 2017-10-08 LAB — CBC WITH DIFFERENTIAL (CANCER CENTER ONLY)
Basophils Absolute: 0 10*3/uL (ref 0.0–0.1)
Basophils Relative: 0 %
EOS PCT: 3 %
Eosinophils Absolute: 0.2 10*3/uL (ref 0.0–0.5)
HCT: 27.8 % — ABNORMAL LOW (ref 34.8–46.6)
Hemoglobin: 8 g/dL — ABNORMAL LOW (ref 11.6–15.9)
LYMPHS ABS: 2.1 10*3/uL (ref 0.9–3.3)
LYMPHS PCT: 25 %
MCH: 21.8 pg — AB (ref 25.1–34.0)
MCHC: 28.8 g/dL — AB (ref 31.5–36.0)
MCV: 75.7 fL — AB (ref 79.5–101.0)
MONO ABS: 0.5 10*3/uL (ref 0.1–0.9)
MONOS PCT: 6 %
Neutro Abs: 5.3 10*3/uL (ref 1.5–6.5)
Neutrophils Relative %: 66 %
PLATELETS: 251 10*3/uL (ref 145–400)
RBC: 3.67 MIL/uL — ABNORMAL LOW (ref 3.70–5.45)
RDW: 26.3 % — AB (ref 11.2–14.5)
WBC Count: 8.1 10*3/uL (ref 3.9–10.3)

## 2017-10-08 LAB — IRON AND TIBC
IRON: 32 ug/dL — AB (ref 41–142)
Saturation Ratios: 7 % — ABNORMAL LOW (ref 21–57)
TIBC: 430 ug/dL (ref 236–444)
UIBC: 398 ug/dL

## 2017-10-08 LAB — FERRITIN: Ferritin: 23 ng/mL (ref 9–269)

## 2017-10-08 MED ORDER — SODIUM CHLORIDE 0.9 % IV SOLN
510.0000 mg | Freq: Once | INTRAVENOUS | Status: AC
  Administered 2017-10-08: 510 mg via INTRAVENOUS
  Filled 2017-10-08: qty 17

## 2017-10-08 MED ORDER — SODIUM CHLORIDE 0.9 % IV SOLN
Freq: Once | INTRAVENOUS | Status: AC
  Administered 2017-10-08: 11:00:00 via INTRAVENOUS

## 2017-10-08 MED ORDER — MEGESTROL ACETATE 40 MG PO TABS: 80.0000 mg | ORAL_TABLET | Freq: Two times a day (BID) | ORAL | 5 refills | Status: DC

## 2017-10-08 NOTE — Assessment & Plan Note (Signed)
Recurrent DVT with antiphospholipid antibodies: First episode: 2007 after undergoing myomectomy, got anticoagulation initially with Coumadin and later with Lovenox for about 12 months ( Lovenox was given during her pregnancy in 2008 and discontinued after she delivered) Second episode: 2009 right leg DVT given Lovenox 6-7 months Third episodeFebruary 2015: Left leg DVT xarelto until November 2016 stopped for heavy bleeding Fourth episode January 2019: Happened when she stopped Xarelto due to heavy menstrual bleed. --------------------------------------------------------------- Repeat testing for lupus anticoagulant in February 2017 was negative. In spite of this. Recommended lifelong anticoagulation based on recurrent blood clots

## 2017-10-08 NOTE — Telephone Encounter (Signed)
Patient will receive updated copy of schedule in treatment area. Patient scheduled per 2/11 los.

## 2017-10-08 NOTE — Progress Notes (Signed)
GYNECOLOGY OFFICE VISIT NOTE  History:  42 y.o. G1P0001 here today for follow up of fibroids and AUB. Started on Megace 61m po bid last visit, she still has bleeding on this as she is also on Xarelto. She denies any abnormal vaginal discharge, pelvic pain or other concerns.   Past Medical History:  Diagnosis Date  . Anemia 09/2017   REQUIRING A TRANSFUSION  . Ant[place]id syndrome ([place]   . Breast cyst    BILATERAL  . DVT (deep venous thrombosis) ([place]   . Dysfunctional uterine bleeding   . Fibroids   . Obesity     Past Surgical History:  Procedure Laterality Date  . CESAREAN SECTION    . DILITATION & CURRETTAGE/HYSTROSCOPY WITH HYDROTHERMAL ABLATION N/A 10/08/2014   Procedure: DILATATION & CURETTAGE/HYSTEROSCOPY WITH HYDROTHERMAL ABLATION;  Surgeon: UOsborne Oman MD;  [facility];  Service: Gynecology;  Laterality: N/A;  . MYOMECTOMY      The following portions of the patient's history were reviewed and updated as appropriate: allergies, current medications, past family history, past medical history, past social history, past surgical history and problem list.   Health Maintenance:  Normal pap and negative HRHPV on 07/17/2017.  Normal mammogram on  09/17/2017.  Review of Systems:  Pertinent items noted in HPI and remainder of comprehensive ROS otherwise negative.  Objective:  Physical Exam BP 131/84   Pulse 71   Wt 203 lb 8 oz (92.3 kg)   LMP 10/08/2017   BMI 30.05 kg/m  CONSTITUTIONAL: Well-developed, well-nourished female in no acute distress.  HENT:  Normocephalic, atraumatic. External right and left ear normal. Oropharynx is clear and moist EYES: Conjunctivae and EOM are normal. Pupils are equal, round, and reactive to light. No scleral icterus.  NECK: Normal range of motion, supple, no masses SKIN: Skin is warm and dry. No rash noted. Not diaphoretic. No erythema. No pallor. NEUROLOGIC: Alert and oriented to person, place, and time. Normal reflexes,  muscle tone coordination. No cranial nerve deficit noted. PSYCHIATRIC: Normal mood and affect. Normal behavior. Normal judgment and thought content. CARDIOVASCULAR: Normal heart rate noted RESPIRATORY: Effort and breath sounds normal, no problems with respiration noted ABDOMEN: Soft, no distention noted.   PELVIC: Deferred MUSCULOSKELETAL: Normal range of motion. No edema noted.  Labs and Imaging 08/24/2017 CLINICAL DATA:  Abnormal uterine bleeding. Fibroids. Long-term anticoagulation use. History of myomectomy, D & Celsius, C-section.  EXAM: TRANSABDOMINAL AND TRANSVAGINAL ULTRASOUND OF PELVIS  TECHNIQUE: Both transabdominal and transvaginal ultrasound examinations of the pelvis were performed. Transabdominal technique was performed for global imaging of the pelvis including uterus, ovaries, adnexal regions, and pelvic cul-de-sac. It was necessary to proceed with endovaginal exam following the transabdominal exam to visualize the ovaries.  COMPARISON:  CT of the abdomen and pelvis 08/02/2017 ; ultrasound of the pelvis on 08/18/2015  FINDINGS: Uterus  Measurements: At least 19.1 x 11.0 x 13.8 cm. Heterogeneous oval mass is identified in the posterior aspect of the uterus measuring 10.5 x 7.1 x 10.9 cm, consistent with large fibroid. In 2016, this fibroid measured 5.6 x 4.6 x 5.9 cm.  Endometrium  Thickness: 12.3 mm. Displaced by a large posterior fibroid but otherwise normal in appearance.  Right ovary  Measurements: 5.1 x 2.1 x 2.8 cm. Normal appearance/no adnexal mass.  Left ovary  Measurements: 3.8 x 3.0 x 3.1 cm. Ovary is normal in appearance. Adjacent to the ovary there is a tubular structure possibly representing hydrosalpinx.  Other findings  No abnormal free fluid.  IMPRESSION:  1. Enlarged uterus containing large posterior fibroid which displaces the endometrium anteriorly. 2. The endometrium is normal in thickness. 3. Suspect left  adnexal hydrosalpinx. The ovaries are normal in appearance.   Electronically Signed   By: Nolon Nations M.D.   On: 08/24/2017 10:19  Assessment & Plan:  1. Recurrent deep vein thrombosis (DVT) ([place]) 2. Fibroids 3. DUB (dysfunctional uterine bleeding) Reviewed ultrasound results. Discussed possible need for Lupron vs hysterectomy (already had myomectomy) vs UFE. Details discussed in detail, all questions answered.  Wants to go with IR referral.  Discussed increasing the dosage of Megace for now, IR referral done - Ambulatory referral to Interventional Radiology - megestrol (MEGACE) 40 MG tablet; Take 2 tablets (80 mg total) by mouth 2 (two) times daily.  Dispense: 120 tablet; Refill: 5  Please refer to After Visit Summary for other counseling recommendations.   Return if symptoms worsen or fail to improve.   Total face-to-face time with patient: 15 minutes.  Over 50% of encounter was spent on counseling and coordination of care.   Verita Schneiders, MD, [place] for [employer], [place]

## 2017-10-08 NOTE — Progress Notes (Signed)
Patient Care Team: Venia Carbon, MD as PCP - General  DIAGNOSIS:  Encounter Diagnoses  Name Primary?  . DEEP VENOUS THROMBOPHLEBITIS, HX OF Yes  . Acute blood loss anemia    CHIEF COMPLIANT: Follow-up of iron deficiency anemia and recurrent DVTs  INTERVAL HISTORY: [name] is a 42 year old with above-mentioned history of recurrent DVTs.  She had a recent episode of DVT and was hospitalized.  She tells me that because of heavy menstrual bleeding she took herself off of Xarelto for about a week and because of that she developed a blood clot.  She was discharged home on Xarelto once again.  She has an appointment tomorrow to discuss with her gynecologist about treatment options for heavy menstrual bleeding.  She is also iron deficient and is scheduled to receive IV iron treatment today.  REVIEW OF SYSTEMS:   Constitutional: Denies fevers, chills or abnormal weight loss, complains of fatigue related to anemia Eyes: Denies blurriness of vision Ears, nose, mouth, throat, and face: Denies mucositis or sore throat Respiratory: Denies cough, dyspnea or wheezes Cardiovascular: Denies palpitation, chest discomfort Gastrointestinal:  Denies nausea, heartburn or change in bowel habits Skin: Denies abnormal skin rashes Lymphatics: Denies new lymphadenopathy or easy bruising Neurological:Denies numbness, tingling or new weaknesses Behavioral/Psych: Mood is stable, no new changes  Extremities: No lower extremity edema, right leg discomfort  All other systems were reviewed with the patient and are negative.  I have reviewed the past medical history, past surgical history, social history and family history with the patient and they are unchanged from previous note.  ALLERGIES:  has No Known Allergies.  MEDICATIONS:  Current Outpatient Medications  Medication Sig Dispense Refill  . Ferrous Sulfate (IRON) 325 (65 Fe) MG TABS Take 1 tablet (325 mg total) by mouth daily. 30 each 0  .  megestrol (MEGACE) 40 MG tablet Take 2 tablets (80 mg total) by mouth 2 (two) times daily. 120 tablet 0  . Multiple Vitamin (MULTIVITAMIN WITH MINERALS) TABS tablet Take 1 tablet by mouth daily.    . Rivaroxaban (XARELTO) 15 MG TABS tablet Take 1 tablet (15 mg total) by mouth 2 (two) times daily with a meal. 42 tablet 0  . XARELTO 20 MG TABS tablet TAKE 1 TABLET (20 MG TOTAL) BY MOUTH DAILY WITH SUPPER. 30 tablet 11   No current facility-administered medications for this visit.     PHYSICAL EXAMINATION: ECOG PERFORMANCE STATUS: 2 - Symptomatic, <50% confined to bed  Vitals:   10/08/17 0941  BP: 132/82  Pulse: (!) 116  Resp: 18  Temp: 98.6 F (37 C)  SpO2: 100%   Filed Weights   10/08/17 0941  Weight: 203 lb 12.8 oz (92.4 kg)    GENERAL:alert, no distress and comfortable SKIN: skin color, texture, turgor are normal, no rashes or significant lesions EYES: normal, Conjunctiva are pink and non-injected, sclera clear OROPHARYNX:no exudate, no erythema and lips, buccal mucosa, and tongue normal  NECK: supple, thyroid normal size, non-tender, without nodularity LYMPH:  no palpable lymphadenopathy in the cervical, axillary or inguinal LUNGS: clear to auscultation and percussion with normal breathing effort HEART: regular rate & rhythm and no murmurs and no lower extremity edema ABDOMEN:abdomen soft, non-tender and normal bowel sounds MUSCULOSKELETAL:no cyanosis of digits and no clubbing  NEURO: alert & oriented x 3 with fluent speech, no focal motor/sensory deficits EXTREMITIES: No lower extremity edema  LABORATORY DATA:  I have reviewed the data as listed CMP Latest Ref Rng & Units 10/02/2017  08/02/2017 03/08/2017  Glucose 65 - 99 mg/dL 132(H) 93 86  BUN 6 - 20 mg/dL 7 11 9   Creatinine 0.44 - 1.00 mg/dL 0.90 0.75 0.81  Sodium 135 - 145 mmol/L 138 139 139  Potassium 3.5 - 5.1 mmol/L 3.5 3.8 4.1  Chloride 101 - 111 mmol/L 105 106 105  CO2 22 - 32 mmol/L 19(L) 27 27  Calcium 8.9 -  10.3 mg/dL 8.9 9.0 9.3  Total Protein 6.5 - 8.1 g/dL 7.2 7.8 7.2  Total Bilirubin 0.3 - 1.2 mg/dL 0.5 0.5 0.2  Alkaline Phos 38 - 126 U/L 44 79 56  AST 15 - 41 U/L 14(L) 15 10  ALT 14 - 54 U/L 10(L) 13(L) 7    Lab Results  Component Value Date   WBC 8.1 10/08/2017   HGB 8.8 (L) 10/03/2017   HCT 27.8 (L) 10/08/2017   MCV 75.7 (L) 10/08/2017   PLT 251 10/08/2017   NEUTROABS 5.3 10/08/2017    ASSESSMENT & PLAN:  DEEP VENOUS THROMBOPHLEBITIS, HX OF Recurrent DVT with antiphospholipid antibodies: First episode: 2007 after undergoing myomectomy, got anticoagulation initially with Coumadin and later with Lovenox for about 12 months ( Lovenox was given during her pregnancy in 2008 and discontinued after she delivered) Second episode: 2009 right leg DVT given Lovenox 6-7 months Third episodeFebruary 2015: Left leg DVT xarelto until November 2016 stopped for heavy bleeding Fourth episode January 2019: Happened when she stopped Xarelto due to heavy menstrual bleed. --------------------------------------------------------------- Repeat testing for lupus anticoagulant in February 2017 was negative. In spite of this. Recommended lifelong anticoagulation based on recurrent blood clots    Acute blood loss anemia Severe iron deficiency anemia Today's hemoglobin is 8 g with microcytosis Iron studies are pending. Plan to administer 2 doses of Feraheme. Treatment options for heavy menstrual bleeding.  Patient plans to discuss with her gynecologist about Return to clinic in 6 months with labs done ahead of time and follow-up.   I spent 25 minutes talking to the patient of which more than half was spent in counseling and coordination of care.  No orders of the defined types were placed in this encounter.  The patient has a good understanding of the overall plan. she agrees with it. she will call with any problems that may develop before the next visit here.   Harriette Ohara,  MD 10/08/17

## 2017-10-08 NOTE — Assessment & Plan Note (Signed)
Severe iron deficiency anemia Today's hemoglobin is 8 g with microcytosis Iron studies are pending. Plan to administer 2 doses of Feraheme. Treatment options for heavy menstrual bleeding.  Patient plans to discuss with her gynecologist about Return to clinic in 6 months with labs done ahead of time and follow-up.

## 2017-10-08 NOTE — Patient Instructions (Addendum)
Uterine Artery Embolization for Fibroids Uterine artery embolization is a nonsurgical treatment to shrink fibroids. A thin plastic tube (catheter) is used to inject material that blocks off the blood supply to the fibroid, which causes the fibroid to shrink. Tell a health care provider about:  Any allergies you have.  All medicines you are taking, including vitamins, herbs, eye drops, creams, and over-the-counter medicines.  Any problems you or family members have had with anesthetic medicines.  Any blood disorders you have.  Any surgeries you have had.  Any medical conditions you have. What are the risks?  Injury to the uterus from decreased blood supply  Infection.  Blood infection (septicemia).  Lack of menstrual periods (amenorrhea).  Death of tissue cells (necrosis) around your bladder or vulva.  Development of a hole between organs or from an organ to the surface of your skin (fistula).  Blood clot in the legs (deep vein thrombosis) or lung (pulmonary embolus). What happens before the procedure?  Ask your health care provider about changing or stopping your regular medicines.  Do not take aspirin or blood thinners (anticoagulants) for 1 week before the surgery or as directed by your health care provider.  Do not eat or drink anything for 8 hours before the surgery or as directed by your health care provider.  Empty your bladder before the procedure begins. What happens during the procedure?  An IV tube will be placed into one of your veins. This will be used to give you a sedative and pain medication (conscious sedation).  You will be given a medicine that numbs the area (local anesthetic).  A small cut will be made in your groin. A catheter is then inserted into the main artery of your leg.  The catheter will be guided through the artery to your uterus. A series of images will be taken while dye is injected through the catheter in your groin. X-rays are taken at  the same time. This is done to provide a road map of the blood supply to your uterus and fibroids.  Tiny plastic spheres, about the size of sand grains, will be injected through the catheter. Metal coils may be used to help block the artery. The particles will lodge in tiny branches of the uterine artery that supplies blood to the fibroids.  The procedure is repeated on the artery that supplies the other side of the uterus.  The catheter is then removed and pressure is held to stop any bleeding. No stitches are needed.  A dressing is then placed over the cut (incision). What happens after the procedure?  You will be taken to a recovery area where your progress will be monitored until you are awake, stable, and taking fluids well. If there are no other problems, you will then be moved to a regular hospital room.  You will be observed overnight in the hospital.  You will have cramping that should be controlled with pain medication. This information is not intended to replace advice given to you by your health care provider. Make sure you discuss any questions you have with your health care provider. Document Released: 10/30/2005 Document Revised: 01/20/2016 Document Reviewed: 02/27/2013 Elsevier Interactive Patient Education  2018 [employer].    Hysterectomy Information A hysterectomy is a surgery in which your uterus is removed. This surgery may be done to treat various medical problems. After the surgery, you will no longer have menstrual periods. The surgery will also make you unable to become pregnant (sterile). The  fallopian tubes and ovaries can be removed (bilateral salpingo-oophorectomy) during this surgery as well. Reasons for a hysterectomy  Persistent, abnormal bleeding.  Lasting (chronic) pelvic pain or infection.  The lining of the uterus (endometrium) starts growing outside the uterus (endometriosis).  The endometrium starts growing in the muscle of the uterus  (adenomyosis).  The uterus falls down into the vagina (pelvic organ prolapse).  Noncancerous growths in the uterus (uterine fibroids) that cause symptoms.  Precancerous cells.  Cervical cancer or uterine cancer. Types of hysterectomies  Supracervical hysterectomy-In this type, the top part of the uterus is removed, but not the cervix.  Total hysterectomy-The uterus and cervix are removed.  Radical hysterectomy-The uterus, the cervix, and the fibrous tissue that holds the uterus in place in the pelvis (parametrium) are removed. Ways a hysterectomy can be performed  Abdominal hysterectomy-A large surgical cut (incision) is made in the abdomen. The uterus is removed through this incision.  Vaginal hysterectomy-An incision is made in the vagina. The uterus is removed through this incision. There are no abdominal incisions.  Conventional laparoscopic hysterectomy-Three or four small incisions are made in the abdomen. A thin, lighted tube with a camera (laparoscope) is inserted into one of the incisions. Other tools are put through the other incisions. The uterus is cut into small pieces. The small pieces are removed through the incisions, or they are removed through the vagina.  Laparoscopically assisted vaginal hysterectomy (LAVH)-Three or four small incisions are made in the abdomen. Part of the surgery is performed laparoscopically and part vaginally. The uterus is removed through the vagina.  Robot-assisted laparoscopic hysterectomy-A laparoscope and other tools are inserted into 3 or 4 small incisions in the abdomen. A computer-controlled device is used to give the surgeon a 3D image and to help control the surgical instruments. This allows for more precise movements of surgical instruments. The uterus is cut into small pieces and removed through the incisions or removed through the vagina. What are the risks? Possible complications associated with this procedure include:  Bleeding and  risk of blood transfusion. Tell your health care provider if you do not want to receive any blood products.  Blood clots in the legs or lung.  Infection.  Injury to surrounding organs.  Problems or side effects related to anesthesia.  Conversion to an abdominal hysterectomy from one of the other techniques.  What to expect after a hysterectomy  You will be given pain medicine.  You will need to have someone with you for the first 3-5 days after you go home.  You will need to follow up with your surgeon in 2-4 weeks after surgery to evaluate your progress.  You may have early menopause symptoms such as hot flashes, night sweats, and insomnia.  If you had a hysterectomy for a problem that was not cancer or not a condition that could lead to cancer, then you no longer need Pap tests. However, even if you no longer need a Pap test, a regular exam is a good idea to make sure no other problems are starting. This information is not intended to replace advice given to you by your health care provider. Make sure you discuss any questions you have with your health care provider. Document Released: 02/07/2001 Document Revised: 01/20/2016 Document Reviewed: 04/21/2013 Elsevier Interactive Patient Education  2017 [place] for shrinking fibroids

## 2017-10-08 NOTE — Patient Instructions (Addendum)

## 2017-10-10 ENCOUNTER — Ambulatory Visit
Admission: RE | Admit: 2017-10-10 | Discharge: 2017-10-10 | Disposition: A | Discharge status: Home / Self Care | Payer: BC Managed Care – PPO | Source: Ambulatory Visit | Attending: Obstetrics & Gynecology | Admitting: Obstetrics & Gynecology

## 2017-10-10 ENCOUNTER — Encounter: Payer: Self-pay | Admitting: *Deleted

## 2017-10-10 DIAGNOSIS — D250 Submucous leiomyoma of uterus: Secondary | ICD-10-CM

## 2017-10-10 HISTORY — PX: IR RADIOLOGIST EVAL & MGMT: IMG5224

## 2017-10-10 NOTE — Consult Note (Signed)
Chief Complaint: Heavy Uterine Bleeding  Referring Physician(s): [facility]  Hematology: Dr. Nicholas Lose   History of Present Illness: [name] is a 42 y.o. female presenting today as a scheduled consult to Vascular & Interventional Radiology, kindly referred by Dr. Harolyn Rutherford, for evaluation of candidacy for uterine artery embolization for symptomatic uterine fibroids.    [name] is here today by herself for the consultation.  Her primary complaint is severe uterine bleeding.  She has been experiencing heavy periods, which have been occurring monthly, lasting at least 10 days, with at least 5-6 days of heavy bleeding with clotting.  This has required frequent pad changes, using the most absorbant type, and sometimes requiring adult Depends as well.  Her most recent cycle started January 17th, and she tells me that this bleeding has continued since then.    She has required transfusions to treat symptomatic anemia secondary to her blood loss.   Regarding the other 2 categories of pain and "bulk" type symptoms, she does tell me that she has frequent urination and pelvic pressure, but these are symptoms that are far less life-style limiting.    She was recently started on Megace by Dr. Harolyn Rutherford, which [name] tells me may be helping.    She has a history of prior myomectomy, performed remotely.  She has a negative pap smear 07/17/2017, negative endometrial biopsy 10/08/2014, and US performed showing fibroids.  Her diagnosis of fibroids was in 2006.    She has a single 42 year old daughter, delivered by C-section, and she has no desire at this time for additional children.    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  Her first episode was over 10 years ago around the time of her daughter's birth.  The second was several years ago, and now she has recently had a third event. Currently she  is taking xarelto.   She believes that the plan for anti-coagulation will be life-long.  Her hematologist is Dr. Lindi Adie.  Certainly, her need for anti-coagulation at this time is likely exacerbating her symptoms.      Past Medical History:  Diagnosis Date  . Anemia 09/2017   REQUIRING A TRANSFUSION  . Antiphospholipid syndrome ([place])   . Breast cyst    BILATERAL  . DVT (deep venous thrombosis) ([place])   . Dysfunctional uterine bleeding   . Fibroids   . Obesity     Past Surgical History:  Procedure Laterality Date  . CESAREAN SECTION    . DILITATION & CURRETTAGE/HYSTROSCOPY WITH HYDROTHERMAL ABLATION N/A 10/08/2014   Procedure: DILATATION & CURETTAGE/HYSTEROSCOPY WITH HYDROTHERMAL ABLATION;  Surgeon: Osborne Oman, MD;  Location: [facility];  Service: Gynecology;  Laterality: N/A;  . IR RADIOLOGIST EVAL & MGMT  10/10/2017  . MYOMECTOMY      Allergies: Patient has no known allergies.  Medications: Prior to Admission medications   Medication Sig Start Date End Date Taking? Authorizing Provider  Ferrous Sulfate (IRON) 325 (65 Fe) MG TABS Take 1 tablet (325 mg total) by mouth daily. 10/03/17  Yes Geradine Girt, DO  megestrol (MEGACE) 40 MG tablet Take 2 tablets (80 mg total) by mouth 2 (two) times daily. 10/08/17  Yes Anyanwu, Sallyanne Havers, MD  Multiple Vitamin (MULTIVITAMIN WITH MINERALS) TABS tablet Take 1 tablet by mouth daily.   Yes [provider]  XARELTO 20 MG TABS tablet TAKE 1 TABLET (20 MG TOTAL) BY MOUTH DAILY WITH  SUPPER. 09/13/17  Yes Venia Carbon, MD  Rivaroxaban (XARELTO) 15 MG TABS tablet Take 1 tablet (15 mg total) by mouth 2 (two) times daily with a meal. Patient not taking: Reported on 10/10/2017 09/14/17   Venia Carbon, MD     Family History  Problem Relation Age of Onset  . Diabetes Paternal Grandmother   . Heart disease Neg Hx   . Hypertension Neg Hx   . Cancer Neg Hx        breast or colon cancer    Social History   Socioeconomic History  .  Marital status: Married    Spouse name: Not on file  . Number of children: 1  . Years of education: Not on file  . Highest education level: Not on file  Social Needs  . Financial resource strain: Not on file  . Food insecurity - worry: Not on file  . Food insecurity - inability: Not on file  . Transportation needs - medical: Not on file  . Transportation needs - non-medical: Not on file  Occupational History  . 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 Smoking status: Never Smoker  . Smokeless tobacco: Never Used  Substance and Sexual Activity  . Alcohol use: Yes    Comment: rare  . Drug use: No  . Sexual activity: Yes    Partners: Male    Birth control/protection: None  Other Topics Concern  . Not on file  Social History Narrative  . Not on file       Review of Systems: A 12 point ROS discussed and pertinent positives are indicated in the HPI above.  All other systems are negative.  Review of Systems  Vital Signs: BP 126/81   Pulse (!) 107   Temp 98.6 F (37 C) (Oral)   Resp 14   Ht 5' 9"  (1.753 m)   Wt 203 lb (92.1 kg)   LMP 10/08/2017   SpO2 100%   BMI 29.98 kg/m   Physical Exam General: 42 yo AA female appearing stated age.  Well-developed, well-nourished.  No distress. HEENT: Atraumatic, normocephalic.  Conjugate gaze, extra-ocular motor intact. No scleral icterus or scleral injection. No lesions on external ears, nose, lips, or gums.  Oral mucosa moist, pink.  Neck: Symmetric with no goiter enlargement.  Chest/Lungs:  Symmetric chest with inspiration/expiration.  No labored breathing.  Clear to auscultation with no wheezes, rhonchi, or rales.  Heart:  RRR, with no third heart sounds appreciated. No JVD appreciated.  Abdomen:  Soft, NT/ND, with + bowel sounds.   Genito-urinary: Deferred Neurologic: Alert & Oriented to person, place, and time.   Normal affect and insight.  Appropriate questions.  Moving all 4 extremities  with gross sensory intact.  Pulse Exam:  No bruit appreciated.  No palpable pulsatile abdominal mass.  Palpable right CFA & left CFA.     Mallampati Score:   Labs:  CBC: Recent Labs    10/02/17 0814 10/02/17 1717 10/03/17 0152 10/03/17 1130 10/03/17 1542 10/08/17 0837  WBC 9.0 10.0 10.4  --   --  8.1  HGB 4.6* 6.2* 6.2* 8.7* 8.8*  --   HCT 17.6* 22.4* 21.8* 28.4* 28.1* 27.8*  PLT 295 261 273  --   --  251    COAGS: Recent Labs    10/02/17 2204  INR 1.97  APTT 46*    BMP: Recent Labs    03/08/17 1602 08/02/17 1312 10/02/17 0814  NA 139  139 138  K 4.1 3.8 3.5  CL 105 106 105  CO2 27 27 19*  GLUCOSE 86 93 132*  BUN 9 11 7   CALCIUM 9.3 9.0 8.9  CREATININE 0.81 0.75 0.90  GFRNONAA  --  >60 >60  GFRAA  --  >60 >60    LIVER FUNCTION TESTS: Recent Labs    03/08/17 1602 08/02/17 1312 10/02/17 0814  BILITOT 0.2 0.5 0.5  AST 10 15 14*  ALT 7 13* 10*  ALKPHOS 56 79 44  PROT 7.2 7.8 7.2  ALBUMIN 3.8 3.9 3.3*    TUMOR MARKERS: No results for input(s): AFPTM, CEA, CA199, CHROMGRNA in the last 8760 hours.  Assessment and Plan:   [name] is a 42 year old female with symptomatic uterine fibroids and recurrent DVT, with significant bleeding likely made worse by her current need for Xarelto.  I had a lengthy discussion with her regarding treatment options, which include medical management, surgical hysterectomy, uterine artery embolization, and HIFU.  I did mention that if HIFU was something that she wanted to pursue, she would likely need to travel out of state for consult.    We spent the majority of her visit discussing Uterine Artery Embolization. I offered a complete informed consent with risk benefit.  Specific risks include: bleeding, infection, post-embolization syndrome, arterial injury, non-target embolization, early menopause, need for further procedure/surgery, inability to achieve further pregnancy, cardiopulmonary collapse, death.  Se discussed  the expected course of post-embolization syndrome and how we typically treat.    Regarding efficacy, we discussed the results of the [place] registry, which report a high efficacy/satisfaction rate at 12 months post, for women treated with UFE, with less than 5% requiring further treatment.   I also discussed with her our possible option of radial artery approach, which for her I think is a good option to achieve early ambulation.  This can be determined on the day of her procedure if she decided to proceed.  Alternative would be femoral approach.    I also offered her a superior hypogastric nerve block for assistance with post-procedure comfort.    Regarding her need for anti-coagulation and anti-phospholipid syndrome, I did let her know that our strategy for management would be to bridge with BID lovenox dosing leading up to her procedure, with the last dose at least 12 hours before.  We could then restart her anti-coagulation the day after her procedure.  We anticipate a 1 night stay for observation.    She is going to discuss with her family and decide.  I answered all of her question to the best of my ability.    Plan: - She will be discussing with family and will follow up regarding her decision.   - If she would like to proceed, we will order pelvic MRI.  She knows that there are a very small number of patients excluded by MRI - I have encouraged her to observe all of her future physician appointments - If we proceed, we will bridge from xarelto with 3 days of weight-based lovenox, with the last dose at least 12 hours before treatment - If we proceed, we will schedule at [facility] with Dr. Earleen Newport for bilateral [place] and anticipate superior hypogastric nerve block, with possible radial access.   Thank you for this interesting consult.  I greatly enjoyed meeting [name] and look forward to participating in their care.  A copy of this report was sent to the requesting provider on  this date.  Electronically Signed: Corrie Mckusick 10/10/2017, 9:34 AM   I spent a total of  40 Minutes   in face to face in clinical consultation, greater than 50% of which was counseling/coordinating care for symptomatic uterine fibroids, possible uterine artery embolization, possible superior hypogastric nerve block.

## 2017-10-15 ENCOUNTER — Other Ambulatory Visit: Payer: Self-pay | Admitting: Interventional Radiology

## 2017-10-15 DIAGNOSIS — D250 Submucous leiomyoma of uterus: Secondary | ICD-10-CM

## 2017-10-16 ENCOUNTER — Telehealth: Payer: Self-pay

## 2017-10-16 NOTE — Telephone Encounter (Signed)
Left VM per dpr just to check on pt after recent hospital stay.

## 2017-10-22 ENCOUNTER — Inpatient Hospital Stay: Payer: BC Managed Care – PPO

## 2017-10-22 ENCOUNTER — Encounter: Payer: Self-pay | Admitting: Internal Medicine

## 2017-10-22 ENCOUNTER — Ambulatory Visit: Payer: BC Managed Care – PPO | Admitting: Internal Medicine

## 2017-10-22 ENCOUNTER — Ambulatory Visit (HOSPITAL_COMMUNITY)
Admission: RE | Admit: 2017-10-22 | Discharge: 2017-10-22 | Disposition: A | Discharge status: Home / Self Care | Payer: BC Managed Care – PPO | Source: Ambulatory Visit | Attending: Interventional Radiology | Admitting: Interventional Radiology

## 2017-10-22 ENCOUNTER — Telehealth: Payer: Self-pay | Admitting: Internal Medicine

## 2017-10-22 VITALS — BP 133/84 | HR 126 | Temp 98.4°F | Resp 18

## 2017-10-22 VITALS — BP 116/80 | HR 118 | Temp 98.4°F | Wt 202.0 lb

## 2017-10-22 DIAGNOSIS — D269 Other benign neoplasm of uterus, unspecified: Secondary | ICD-10-CM | POA: Insufficient documentation

## 2017-10-22 DIAGNOSIS — R9389 Abnormal findings on diagnostic imaging of other specified body structures: Secondary | ICD-10-CM | POA: Diagnosis not present

## 2017-10-22 DIAGNOSIS — N836 Hematosalpinx: Secondary | ICD-10-CM | POA: Insufficient documentation

## 2017-10-22 DIAGNOSIS — D62 Acute posthemorrhagic anemia: Secondary | ICD-10-CM

## 2017-10-22 DIAGNOSIS — N938 Other specified abnormal uterine and vaginal bleeding: Secondary | ICD-10-CM | POA: Diagnosis not present

## 2017-10-22 DIAGNOSIS — D250 Submucous leiomyoma of uterus: Secondary | ICD-10-CM | POA: Diagnosis present

## 2017-10-22 DIAGNOSIS — D251 Intramural leiomyoma of uterus: Secondary | ICD-10-CM | POA: Insufficient documentation

## 2017-10-22 DIAGNOSIS — I82409 Acute embolism and thrombosis of unspecified deep veins of unspecified lower extremity: Secondary | ICD-10-CM

## 2017-10-22 DIAGNOSIS — D500 Iron deficiency anemia secondary to blood loss (chronic): Secondary | ICD-10-CM | POA: Diagnosis not present

## 2017-10-22 DIAGNOSIS — N808 Other endometriosis: Secondary | ICD-10-CM | POA: Insufficient documentation

## 2017-10-22 MED ORDER — GADOBENATE DIMEGLUMINE 529 MG/ML IV SOLN
20.0000 mL | Freq: Once | INTRAVENOUS | Status: AC | PRN
  Administered 2017-10-22: 19 mL via INTRAVENOUS

## 2017-10-22 MED ORDER — IRON 325 (65 FE) MG PO TABS: 1.0000 | ORAL_TABLET | Freq: Every day | ORAL | 11 refills | Status: DC

## 2017-10-22 MED ORDER — SODIUM CHLORIDE 0.9 % IV SOLN
Freq: Once | INTRAVENOUS | Status: AC
  Administered 2017-10-22: 14:00:00 via INTRAVENOUS

## 2017-10-22 MED ORDER — SODIUM CHLORIDE 0.9 % IV SOLN
510.0000 mg | Freq: Once | INTRAVENOUS | Status: AC
  Administered 2017-10-22: 510 mg via INTRAVENOUS
  Filled 2017-10-22: qty 17

## 2017-10-22 NOTE — Progress Notes (Signed)
   Subjective:    Patient ID: [name], female    DOB: Sep 01, 1975, 42 y.o.   MRN: [id number]  HPI Here for follow up of worsened/recurrent DVT Her leg is better  Still has vaginal bleeding Taking iron and had MRI today in preparation for considering embolization Would consider hysterectomy if not effective or not a candidate Continues on higher dose megace now  Getting iron infusion today  Current Outpatient Medications on File Prior to Visit  Medication Sig Dispense Refill  . Ferrous Sulfate (IRON) 325 (65 Fe) MG TABS Take 1 tablet (325 mg total) by mouth daily. 30 each 0  . megestrol (MEGACE) 40 MG tablet Take 2 tablets (80 mg total) by mouth 2 (two) times daily. 120 tablet 5  . Multiple Vitamin (MULTIVITAMIN WITH MINERALS) TABS tablet Take 1 tablet by mouth daily.    [name] 20 MG TABS tablet TAKE 1 TABLET (20 MG TOTAL) BY MOUTH DAILY WITH SUPPER. 30 tablet 11   No current facility-administered medications on file prior to visit.     No Known Allergies  Past Medical History:  Diagnosis Date  . Anemia 09/2017   REQUIRING A TRANSFUSION  . Antiphospholipid syndrome ([place])   . Breast cyst    BILATERAL  . DVT (deep venous thrombosis) ([place])   . Dysfunctional uterine bleeding   . Fibroids   . Obesity     Past Surgical History:  Procedure Laterality Date  . CESAREAN SECTION    . DILITATION & CURRETTAGE/HYSTROSCOPY WITH HYDROTHERMAL ABLATION N/A 10/08/2014   Procedure: DILATATION & CURETTAGE/HYSTEROSCOPY WITH HYDROTHERMAL ABLATION;  Surgeon: Osborne Oman, MD;  Location: [facility];  Service: Gynecology;  Laterality: N/A;  . IR RADIOLOGIST EVAL & MGMT  10/10/2017  . MYOMECTOMY      Family History  Problem Relation Age of Onset  . Diabetes Paternal Grandmother   . Heart disease Neg Hx   . Hypertension Neg Hx   . Cancer Neg Hx        breast or colon cancer    Social History   Socioeconomic History  . Marital status: Married    Spouse name: Not on file  .  Number of children: 1  . Years of education: Not on file  . Highest education level: Not on file  Social Needs  . Financial resource strain: Not on file  . Food insecurity - worry: Not on file  . Food insecurity - inability: Not on file  . Transportation needs - medical: Not on file  . Transportation needs - non-medical: Not on file  Occupational History  . 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 Smoking status: Never Smoker  . Smokeless tobacco: Never Used  Substance and Sexual Activity  . Alcohol use: Yes    Comment: rare  . Drug use: No  . Sexual activity: Yes    Partners: Male    Birth control/protection: None  Other Topics Concern  . Not on file  Social History Narrative  . Not on file   Review of Systems No fever No chest pain No SOB    Objective:   Physical Exam  Constitutional: She appears well-developed. No distress.  Musculoskeletal: She exhibits no edema.  Calves and thighs both back to normal size No tenderness on right  Psychiatric: She has a normal mood and affect. Her behavior is normal.          Assessment & Plan:

## 2017-10-22 NOTE — Telephone Encounter (Signed)
Copied from [place] [id number]. Topic: Quick Communication - See Telephone Encounter >> Oct 22, 2017  3:18 PM Conception Chancy, NT wrote: CRM for notification. See Telephone encounter for:  10/22/17.  Vickie is calling from [facility] and states pt is going to be having a Uterine Fibroid Embolization and is needing the okay to hold the XARELTO 24 hours before procedure. Please contact Vickie or put a note in epic.   Vickie [phone]

## 2017-10-22 NOTE — Assessment & Plan Note (Signed)
Considering embolization, otherwise would need hysterectomy

## 2017-10-22 NOTE — Telephone Encounter (Signed)
That is okay! Hope it works!

## 2017-10-22 NOTE — Telephone Encounter (Signed)
Left message on vm for Vickie

## 2017-10-22 NOTE — Assessment & Plan Note (Signed)
Back to normal exam and no symptoms Continue the xarelto

## 2017-10-24 ENCOUNTER — Telehealth: Payer: Self-pay | Admitting: Radiology

## 2017-10-24 ENCOUNTER — Telehealth: Payer: Self-pay

## 2017-10-24 DIAGNOSIS — N938 Other specified abnormal uterine and vaginal bleeding: Secondary | ICD-10-CM

## 2017-10-24 NOTE — Telephone Encounter (Signed)
Copied from [place]. Topic: Inquiry >> Oct 24, 2017 10:56 AM Scherrie Gerlach wrote: Reason for CRM:   pt saw Dr Silvio Pate on Monday, then later had iron infusion. Pt states she is still feeling weak and would like lab order to see what her levels are. Pt tried to call Dr Sonny Dandy, the dr that order the infusion, but only got a answering machine.  Pt hopes to get an order from Dr Silvio Pate for today, or she can go in the morning for the labs. Pt would like to go to [facility] to have the labs done.  Pt is at work, but is feeling very weak.  Pt declined to speak with triage, stating Dr Silvio Pate is familiar with her situation and she just saw him Monday. Please advise

## 2017-10-24 NOTE — Telephone Encounter (Signed)
Remind her that the iron takes a while to work. I did put in an order---please schedule a time for her to go to [facility] to get the blood draw

## 2017-10-24 NOTE — Telephone Encounter (Signed)
patient called stating that she was passing larger blood clots 2 or 3 times a day and bleeding to the point of having to charge a depends every 2 hours. Wanted to get appointment with Dr Harolyn Rutherford. Dr Harolyn Rutherford does not have any openings until 11/19/17, instructed to been seen at [facility], expresses understanding, states that she may wait til the morning, I explained that if she is bleeding enough to saturate a depends in two hours, she should been seen sooner than later.

## 2017-10-24 NOTE — Telephone Encounter (Signed)
Spoke to pt. Made lab appt for tomorrow at [facility]

## 2017-10-25 ENCOUNTER — Ambulatory Visit: Payer: BC Managed Care – PPO

## 2017-10-25 ENCOUNTER — Other Ambulatory Visit (HOSPITAL_COMMUNITY): Payer: Self-pay | Admitting: Interventional Radiology

## 2017-10-25 ENCOUNTER — Telehealth: Payer: Self-pay | Admitting: Hematology and Oncology

## 2017-10-25 ENCOUNTER — Other Ambulatory Visit (INDEPENDENT_AMBULATORY_CARE_PROVIDER_SITE_OTHER): Payer: BC Managed Care – PPO

## 2017-10-25 ENCOUNTER — Telehealth: Payer: Self-pay | Admitting: Radiology

## 2017-10-25 ENCOUNTER — Other Ambulatory Visit: Payer: BC Managed Care – PPO

## 2017-10-25 ENCOUNTER — Ambulatory Visit: Payer: Self-pay

## 2017-10-25 ENCOUNTER — Telehealth: Payer: Self-pay | Admitting: *Deleted

## 2017-10-25 ENCOUNTER — Other Ambulatory Visit: Payer: Self-pay

## 2017-10-25 DIAGNOSIS — D508 Other iron deficiency anemias: Secondary | ICD-10-CM

## 2017-10-25 DIAGNOSIS — N938 Other specified abnormal uterine and vaginal bleeding: Secondary | ICD-10-CM

## 2017-10-25 DIAGNOSIS — D259 Leiomyoma of uterus, unspecified: Secondary | ICD-10-CM

## 2017-10-25 LAB — CBC
MCHC: 30.5 g/dL (ref 30.0–36.0)
MCV: 85.7 fl (ref 78.0–100.0)
Platelets: 181 10*3/uL (ref 150.0–400.0)
RBC: 2.15 Mil/uL — ABNORMAL LOW (ref 3.87–5.11)
RDW: 29.1 % — AB (ref 11.5–15.5)
WBC: 7.7 10*3/uL (ref 4.0–10.5)

## 2017-10-25 NOTE — Telephone Encounter (Signed)
Initiated Triage call with pt. re: low hgb. and c/o weakness.  Noted Dr. Geralyn Flash note at 4:15 PM of Hgb. 5.6, and plan to transfuse with 2 U PRBC's.  Pt. received phone call from Dr. Geralyn Flash office and was informed of plan to do blood transfusion in AM.  Pt. verb. that she felt better that there is a plan in place.  Did not proceed with Triage process at this time. Pt. Agreed with plan.

## 2017-10-25 NOTE — Telephone Encounter (Signed)
I called the pt to let her know her results so she could make a decision as to what she may need to do. She is going to try and reach the dr's office, again, herself. She may end up in the ER the way she feels.

## 2017-10-25 NOTE — Telephone Encounter (Signed)
Closing encounter at this time.  See other 10-25-2017 notes.

## 2017-10-25 NOTE — Telephone Encounter (Signed)
I have been trying to reach Dr Lindi Adie on the physician line and it keeps saying that line is busy.

## 2017-10-25 NOTE — Telephone Encounter (Signed)
Results released and sent to Dr Lindi Adie. Please notify hematology office to be sure Dr Lindi Adie has seen these results--she probably needs iron or a transfusion as soon as possible

## 2017-10-25 NOTE — Telephone Encounter (Addendum)
Call received and transferred from [facility] in reference to reported "HGB = 5.6.  Dr. Silvio Pate trying to reach [facility].  I need blood or iron.  I'm waiting for embolization, no bleeding.  No s.o.b.  No chest pain just weak from the low HGB level.  Last menstruation September 13, 2017.  I have received iron before.  Call transferred to nurse ext. [phone].  Othella advised to report to ED with low HGB and any symptom progression.

## 2017-10-25 NOTE — Telephone Encounter (Signed)
Received blood work from primary care physician which showed a hemoglobin of 5.6.  We will arrange for 2 units of packed red cells. She recently received 2 doses of IV iron therapy. We will arrange a follow-up in 2 weeks.

## 2017-10-25 NOTE — Telephone Encounter (Signed)
Left detailed message for Dr Geralyn Flash nurse.

## 2017-10-25 NOTE — Telephone Encounter (Signed)
[facility] called critical results, HGB - 5.6, HCT - 18.4. Results called to Dr Silvio Pate

## 2017-10-26 ENCOUNTER — Inpatient Hospital Stay: Payer: BC Managed Care – PPO

## 2017-10-26 ENCOUNTER — Telehealth: Payer: Self-pay

## 2017-10-26 ENCOUNTER — Inpatient Hospital Stay: Payer: BC Managed Care – PPO | Attending: Hematology and Oncology

## 2017-10-26 ENCOUNTER — Other Ambulatory Visit: Payer: BC Managed Care – PPO

## 2017-10-26 ENCOUNTER — Ambulatory Visit (HOSPITAL_COMMUNITY)
Admission: RE | Admit: 2017-10-26 | Discharge: 2017-10-26 | Disposition: A | Discharge status: Home / Self Care | Payer: BC Managed Care – PPO | Source: Ambulatory Visit | Attending: Hematology and Oncology | Admitting: Hematology and Oncology

## 2017-10-26 DIAGNOSIS — Z7901 Long term (current) use of anticoagulants: Secondary | ICD-10-CM | POA: Insufficient documentation

## 2017-10-26 DIAGNOSIS — R5383 Other fatigue: Secondary | ICD-10-CM | POA: Insufficient documentation

## 2017-10-26 DIAGNOSIS — Z86718 Personal history of other venous thrombosis and embolism: Secondary | ICD-10-CM | POA: Diagnosis not present

## 2017-10-26 DIAGNOSIS — D72823 Leukemoid reaction: Secondary | ICD-10-CM | POA: Insufficient documentation

## 2017-10-26 DIAGNOSIS — R509 Fever, unspecified: Secondary | ICD-10-CM | POA: Diagnosis not present

## 2017-10-26 DIAGNOSIS — J029 Acute pharyngitis, unspecified: Secondary | ICD-10-CM | POA: Insufficient documentation

## 2017-10-26 DIAGNOSIS — R42 Dizziness and giddiness: Secondary | ICD-10-CM | POA: Diagnosis not present

## 2017-10-26 DIAGNOSIS — N938 Other specified abnormal uterine and vaginal bleeding: Secondary | ICD-10-CM | POA: Diagnosis not present

## 2017-10-26 DIAGNOSIS — R05 Cough: Secondary | ICD-10-CM | POA: Diagnosis not present

## 2017-10-26 DIAGNOSIS — R1084 Generalized abdominal pain: Secondary | ICD-10-CM | POA: Insufficient documentation

## 2017-10-26 DIAGNOSIS — R0602 Shortness of breath: Secondary | ICD-10-CM | POA: Diagnosis not present

## 2017-10-26 DIAGNOSIS — N920 Excessive and frequent menstruation with regular cycle: Secondary | ICD-10-CM | POA: Insufficient documentation

## 2017-10-26 DIAGNOSIS — Z79899 Other long term (current) drug therapy: Secondary | ICD-10-CM | POA: Diagnosis not present

## 2017-10-26 DIAGNOSIS — D508 Other iron deficiency anemias: Secondary | ICD-10-CM

## 2017-10-26 DIAGNOSIS — N898 Other specified noninflammatory disorders of vagina: Secondary | ICD-10-CM | POA: Insufficient documentation

## 2017-10-26 DIAGNOSIS — R531 Weakness: Secondary | ICD-10-CM | POA: Insufficient documentation

## 2017-10-26 DIAGNOSIS — D500 Iron deficiency anemia secondary to blood loss (chronic): Secondary | ICD-10-CM | POA: Diagnosis not present

## 2017-10-26 LAB — CBC WITH DIFFERENTIAL (CANCER CENTER ONLY)
BASOS ABS: 0.1 10*3/uL (ref 0.0–0.1)
Basophils Relative: 1 %
EOS PCT: 0 %
Eosinophils Absolute: 0 10*3/uL (ref 0.0–0.5)
HEMATOCRIT: 21.1 % — AB (ref 34.8–46.6)
HEMOGLOBIN: 5.7 g/dL — AB (ref 11.6–15.9)
LYMPHS ABS: 1.7 10*3/uL (ref 0.9–3.3)
LYMPHS PCT: 19 %
MCH: 24.5 pg — ABNORMAL LOW (ref 25.1–34.0)
MCHC: 27 g/dL — ABNORMAL LOW (ref 31.5–36.0)
MCV: 90.6 fL (ref 79.5–101.0)
Monocytes Absolute: 0.9 10*3/uL (ref 0.1–0.9)
Monocytes Relative: 11 %
NEUTROS ABS: 6.1 10*3/uL (ref 1.5–6.5)
NRBC: 8 /100{WBCs} — AB
Neutrophils Relative %: 69 %
Platelet Count: 160 10*3/uL (ref 145–400)
RBC: 2.33 MIL/uL — ABNORMAL LOW (ref 3.70–5.45)
RDW: 25.8 % — AB (ref 11.2–14.5)
WBC Count: 8.8 10*3/uL (ref 3.9–10.3)

## 2017-10-26 LAB — PREPARE RBC (CROSSMATCH)

## 2017-10-26 MED ORDER — DIPHENHYDRAMINE HCL 25 MG PO CAPS
25.0000 mg | ORAL_CAPSULE | Freq: Once | ORAL | Status: AC
  Administered 2017-10-26: 25 mg via ORAL

## 2017-10-26 MED ORDER — DIPHENHYDRAMINE HCL 25 MG PO CAPS
ORAL_CAPSULE | ORAL | Status: AC
  Filled 2017-10-26: qty 1

## 2017-10-26 MED ORDER — ACETAMINOPHEN 325 MG PO TABS
ORAL_TABLET | ORAL | Status: AC
  Filled 2017-10-26: qty 2

## 2017-10-26 MED ORDER — ACETAMINOPHEN 325 MG PO TABS
650.0000 mg | ORAL_TABLET | Freq: Once | ORAL | Status: AC
  Administered 2017-10-26: 650 mg via ORAL

## 2017-10-26 MED ORDER — DIPHENHYDRAMINE HCL 25 MG PO TABS
25.0000 mg | ORAL_TABLET | Freq: Once | ORAL | Status: AC
  Administered 2017-10-26: 25 mg via ORAL
  Filled 2017-10-26: qty 1

## 2017-10-26 MED ORDER — SODIUM CHLORIDE 0.9 % IV SOLN
250.0000 mL | Freq: Once | INTRAVENOUS | Status: AC
  Administered 2017-10-26: 250 mL via INTRAVENOUS

## 2017-10-26 NOTE — Patient Instructions (Signed)

## 2017-10-26 NOTE — Telephone Encounter (Signed)
Lab called, received critical hemoglobin level of 5.7, pt previously scheduled for 2 units of PRBCs this am.  Dr Lindi Adie not here, notified Dr Jana Hakim.

## 2017-10-27 LAB — TYPE AND SCREEN
ABO/RH(D): B POS
Antibody Screen: POSITIVE
DONOR AG TYPE: NEGATIVE
Donor AG Type: NEGATIVE
UNIT DIVISION: 0
Unit division: 0

## 2017-10-27 LAB — BPAM RBC
BLOOD PRODUCT EXPIRATION DATE: 201904032359
Blood Product Expiration Date: 201904012359
ISSUE DATE / TIME: 201903011207
ISSUE DATE / TIME: 201903011207
Unit Type and Rh: 7300
Unit Type and Rh: 7300

## 2017-10-30 ENCOUNTER — Telehealth: Payer: Self-pay | Admitting: Hematology and Oncology

## 2017-10-30 NOTE — Telephone Encounter (Signed)
Spoke to patient regarding upcoming march appointments per 2/28 sch message.

## 2017-11-01 ENCOUNTER — Telehealth: Payer: Self-pay | Admitting: Hematology and Oncology

## 2017-11-01 NOTE — Telephone Encounter (Signed)
Patient called in a rescheduled appts to 3/19.

## 2017-11-05 ENCOUNTER — Ambulatory Visit: Payer: BC Managed Care – PPO | Admitting: Hematology and Oncology

## 2017-11-05 ENCOUNTER — Other Ambulatory Visit: Payer: BC Managed Care – PPO

## 2017-11-05 ENCOUNTER — Ambulatory Visit: Payer: BC Managed Care – PPO | Admitting: Internal Medicine

## 2017-11-05 ENCOUNTER — Encounter: Payer: Self-pay | Admitting: Internal Medicine

## 2017-11-05 VITALS — BP 112/82 | HR 119 | Temp 98.4°F | Wt 203.0 lb

## 2017-11-05 DIAGNOSIS — I82409 Acute embolism and thrombosis of unspecified deep veins of unspecified lower extremity: Secondary | ICD-10-CM | POA: Diagnosis not present

## 2017-11-05 NOTE — Assessment & Plan Note (Signed)
Still okay on the xarelto. Okay to stop for embolization--hopefully this will keep her from needing hysterectomy FMLA papers done

## 2017-11-05 NOTE — Progress Notes (Signed)
   Subjective:    Patient ID: [name], female    DOB: 02/25/1976, 42 y.o.   MRN: [id number]  HPI Here for follow up of excessive bleeding and DVT Is going to go ahead with embolization procedure---later in the month Still bleeding--some bad days-----like yesterday Getting another iron infusion next week--on oral iron and megace  Has missed a lot of work Needs FMLA forms done  Current Outpatient Medications on File Prior to Visit  Medication Sig Dispense Refill  . Ferrous Sulfate (IRON) 325 (65 Fe) MG TABS Take 1 tablet (325 mg total) by mouth daily. 30 each 11  . megestrol (MEGACE) 40 MG tablet Take 2 tablets (80 mg total) by mouth 2 (two) times daily. 120 tablet 5  . Multiple Vitamin (MULTIVITAMIN WITH MINERALS) TABS tablet Take 1 tablet by mouth daily.    Alveda Reasons 20 MG TABS tablet TAKE 1 TABLET (20 MG TOTAL) BY MOUTH DAILY WITH SUPPER. 30 tablet 11   No current facility-administered medications on file prior to visit.     No Known Allergies  Past Medical History:  Diagnosis Date  . Anemia 09/2017   REQUIRING A TRANSFUSION  . Antiphospholipid syndrome ([place])   . Breast cyst    BILATERAL  . DVT (deep venous thrombosis) ([place])   . Dysfunctional uterine bleeding   . Fibroids   . Obesity     Past Surgical History:  Procedure Laterality Date  . CESAREAN SECTION    . DILITATION & CURRETTAGE/HYSTROSCOPY WITH HYDROTHERMAL ABLATION N/A 10/08/2014   Procedure: DILATATION & CURETTAGE/HYSTEROSCOPY WITH HYDROTHERMAL ABLATION;  Surgeon: Osborne Oman, MD;  Location: [facility];  Service: Gynecology;  Laterality: N/A;  . IR RADIOLOGIST EVAL & MGMT  10/10/2017  . MYOMECTOMY      Family History  Problem Relation Age of Onset  . Diabetes Paternal Grandmother   . Heart disease Neg Hx   . Hypertension Neg Hx   . Cancer Neg Hx        breast or colon cancer    Social History   Socioeconomic History  . Marital status: Married    Spouse name: Not on file  . Number of  children: 1  . Years of education: Not on file  . Highest education level: Not on file  Social Needs  . Financial resource strain: Not on file  . Food insecurity - worry: Not on file  . Food insecurity - inability: Not on file  . Transportation needs - medical: Not on file  . Transportation needs - non-medical: Not on file  Occupational History  . 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 Smoking status: Never Smoker  . Smokeless tobacco: Never Used  Substance and Sexual Activity  . Alcohol use: Yes    Comment: rare  . Drug use: No  . Sexual activity: Yes    Partners: Male    Birth control/protection: None  Other Topics Concern  . Not on file  Social History Narrative  . Not on file   Review of Systems Going to work regularly now Appetite is better now    Objective:   Physical Exam  Constitutional: No distress.  Psychiatric: She has a normal mood and affect. Her behavior is normal.          Assessment & Plan:

## 2017-11-07 ENCOUNTER — Telehealth: Payer: Self-pay | Admitting: *Deleted

## 2017-11-07 NOTE — Telephone Encounter (Signed)
Spoke to pt. Created letter and place up front.

## 2017-11-07 NOTE — Telephone Encounter (Signed)
Copied from [place] [id number]. Topic: Inquiry >> Nov 07, 2017  7:54 AM Scherrie Gerlach wrote: Reason for CRM: pt saw Dr Silvio Pate on 11/05/17, and states today she is not feeling well at all, cramping, bleeding. Pt would like dr Silvio Pate to write her out of work for the rest of the week, return to work on Monday 11/12/17. Pt has upcoming surgery on 3/27 for her fibroids. Pt would like you to put work note on [employer]

## 2017-11-07 NOTE — Telephone Encounter (Signed)
Please write her a work note as requested. I am not sure you can put it on MyChart but you can fax it if she has a secure number

## 2017-11-12 ENCOUNTER — Other Ambulatory Visit: Payer: Self-pay

## 2017-11-12 DIAGNOSIS — D508 Other iron deficiency anemias: Secondary | ICD-10-CM

## 2017-11-12 MED ORDER — TRAMADOL HCL 50 MG PO TABS: 50.0000 mg | ORAL_TABLET | Freq: Three times a day (TID) | ORAL | 0 refills | Status: DC | PRN

## 2017-11-12 NOTE — Telephone Encounter (Signed)
Please let her know that I sent a prescription for a pain medication for her (tramadol). Please her OOW note till her procedure 3/26

## 2017-11-12 NOTE — Telephone Encounter (Signed)
Patient is calling because she would like to know if Dr.Letvak could call her in some pain medication. Stated that she is still having a lot of pain in her pelvic area. She would like the medication to be sent to  [facility] [address] [place] Lance Muss [phone]. Patient would also like to know if Dr.Letvak could write her out of work for this week as well. If someone could give her a call back to let her know if he will write her out of work at [phone]

## 2017-11-12 NOTE — Telephone Encounter (Signed)
Patient called and she says "I have been having cramping and pain all weekend and is in the bed right now. I've taken Tylenol, which didn't help, and Motrin, which helped a little. I have the embolization scheduled for next Tuesday, 11/20/17. I just need something for the pain and a note for out of work for this week." I advised that she may need to come in for an appointment before prescribing pain medicine, but I will go ahead and send to Dr. Silvio Pate and someone will be contacting her with his recommendation, she verbalized understanding.

## 2017-11-12 NOTE — Addendum Note (Signed)
Addended by: Viviana Simpler I on: 11/12/2017 12:54 PM   Modules accepted: Orders

## 2017-11-12 NOTE — Telephone Encounter (Signed)
Spoke to pt. Letter up front

## 2017-11-13 ENCOUNTER — Other Ambulatory Visit: Payer: Self-pay

## 2017-11-13 ENCOUNTER — Inpatient Hospital Stay (HOSPITAL_BASED_OUTPATIENT_CLINIC_OR_DEPARTMENT_OTHER): Payer: BC Managed Care – PPO | Admitting: Hematology and Oncology

## 2017-11-13 ENCOUNTER — Inpatient Hospital Stay: Payer: BC Managed Care – PPO

## 2017-11-13 DIAGNOSIS — D500 Iron deficiency anemia secondary to blood loss (chronic): Secondary | ICD-10-CM | POA: Diagnosis not present

## 2017-11-13 DIAGNOSIS — D62 Acute posthemorrhagic anemia: Secondary | ICD-10-CM

## 2017-11-13 DIAGNOSIS — Z8672 Personal history of thrombophlebitis: Secondary | ICD-10-CM

## 2017-11-13 DIAGNOSIS — N920 Excessive and frequent menstruation with regular cycle: Secondary | ICD-10-CM | POA: Diagnosis not present

## 2017-11-13 DIAGNOSIS — D508 Other iron deficiency anemias: Secondary | ICD-10-CM

## 2017-11-13 DIAGNOSIS — Z86718 Personal history of other venous thrombosis and embolism: Secondary | ICD-10-CM

## 2017-11-13 DIAGNOSIS — Z7901 Long term (current) use of anticoagulants: Secondary | ICD-10-CM

## 2017-11-13 LAB — CBC WITH DIFFERENTIAL (CANCER CENTER ONLY)
BASOS ABS: 0 10*3/uL (ref 0.0–0.1)
BASOS PCT: 0 %
Eosinophils Absolute: 0 10*3/uL (ref 0.0–0.5)
Eosinophils Relative: 0 %
HEMATOCRIT: 23.9 % — AB (ref 34.8–46.6)
Hemoglobin: 7.1 g/dL — ABNORMAL LOW (ref 11.6–15.9)
Lymphocytes Relative: 5 %
Lymphs Abs: 1.2 10*3/uL (ref 0.9–3.3)
MCH: 24.7 pg — ABNORMAL LOW (ref 25.1–34.0)
MCHC: 29.7 g/dL — ABNORMAL LOW (ref 31.5–36.0)
MCV: 83 fL (ref 79.5–101.0)
Monocytes Absolute: 3.3 10*3/uL — ABNORMAL HIGH (ref 0.1–0.9)
Monocytes Relative: 13 %
Neutro Abs: 19.9 10*3/uL — ABNORMAL HIGH (ref 1.5–6.5)
Neutrophils Relative %: 82 %
PLATELETS: 246 10*3/uL (ref 145–400)
RBC: 2.88 MIL/uL — AB (ref 3.70–5.45)
RDW: 19.6 % — AB (ref 11.2–14.5)
WBC: 24.3 10*3/uL — AB (ref 3.9–10.3)

## 2017-11-13 NOTE — Progress Notes (Signed)
Patient Care Team: Venia Carbon, MD as PCP - General  DIAGNOSIS:  Encounter Diagnoses  Name Primary?  . Acute blood loss anemia   . DEEP VENOUS THROMBOPHLEBITIS, HX OF     CHIEF COMPLIANT: Follow-up of chronic blood loss anemia  INTERVAL HISTORY: [name] is a 42 year old with above-mentioned history of blood loss related anemia related to continuous heavy menstrual cycle.  She has never had a break in her menstrual cycle.  She is scheduled to undergo ablation procedure this week.  She feels weak and tired.  Her hemoglobin today is 7.2.  She does feel lightheaded and dizzy and short of breath to minimal exertion.  REVIEW OF SYSTEMS:   Constitutional: Denies fevers, chills or abnormal weight loss Eyes: Denies blurriness of vision Ears, nose, mouth, throat, and face: Denies mucositis or sore throat Respiratory: Denies cough, dyspnea or wheezes Cardiovascular: Denies palpitation, chest discomfort Gastrointestinal:  Denies nausea, heartburn or change in bowel habits Skin: Denies abnormal skin rashes Lymphatics: Denies new lymphadenopathy or easy bruising Neurological:Denies numbness, tingling or new weaknesses Behavioral/Psych: Mood is stable, no new changes  Extremities: No lower extremity edema All other systems were reviewed with the patient and are negative.  I have reviewed the past medical history, past surgical history, social history and family history with the patient and they are unchanged from previous note.  ALLERGIES:  has No Known Allergies.  MEDICATIONS:  Current Outpatient Medications  Medication Sig Dispense Refill  . Ferrous Sulfate (IRON) 325 (65 Fe) MG TABS Take 1 tablet (325 mg total) by mouth daily. 30 each 11  . megestrol (MEGACE) 40 MG tablet Take 2 tablets (80 mg total) by mouth 2 (two) times daily. 120 tablet 5  . Multiple Vitamin (MULTIVITAMIN WITH MINERALS) TABS tablet Take 1 tablet by mouth daily.    . traMADol (ULTRAM) 50 MG tablet  Take 1 tablet (50 mg total) by mouth 3 (three) times daily as needed. 30 tablet 0  . XARELTO 20 MG TABS tablet TAKE 1 TABLET (20 MG TOTAL) BY MOUTH DAILY WITH SUPPER. 30 tablet 11   No current facility-administered medications for this visit.     PHYSICAL EXAMINATION: ECOG PERFORMANCE STATUS: 3 - Symptomatic, >50% confined to bed  Vitals:   11/13/17 1521  BP: 103/71  Pulse: (!) 110  Resp: 17  Temp: 98.4 F (36.9 C)  SpO2: 100%   Filed Weights   11/13/17 1521  Weight: 189 lb (85.7 kg)    GENERAL:alert, no distress and comfortable SKIN: skin color, texture, turgor are normal, no rashes or significant lesions EYES: normal, Conjunctiva are pink and non-injected, sclera clear OROPHARYNX:no exudate, no erythema and lips, buccal mucosa, and tongue normal  NECK: supple, thyroid normal size, non-tender, without nodularity LYMPH:  no palpable lymphadenopathy in the cervical, axillary or inguinal LUNGS: clear to auscultation and percussion with normal breathing effort HEART: regular rate & rhythm and no murmurs and no lower extremity edema ABDOMEN:abdomen soft, non-tender and normal bowel sounds MUSCULOSKELETAL:no cyanosis of digits and no clubbing  NEURO: alert & oriented x 3 with fluent speech, no focal motor/sensory deficits EXTREMITIES: No lower extremity edema  LABORATORY DATA:  I have reviewed the data as listed CMP Latest Ref Rng & Units 10/02/2017 08/02/2017 03/08/2017  Glucose 65 - 99 mg/dL 132(H) 93 86  BUN 6 - 20 mg/dL 7 11 9   Creatinine 0.44 - 1.00 mg/dL 0.90 0.75 0.81  Sodium 135 - 145 mmol/L 138 139 139  Potassium 3.5 -  5.1 mmol/L 3.5 3.8 4.1  Chloride 101 - 111 mmol/L 105 106 105  CO2 22 - 32 mmol/L 19(L) 27 27  Calcium 8.9 - 10.3 mg/dL 8.9 9.0 9.3  Total Protein 6.5 - 8.1 g/dL 7.2 7.8 7.2  Total Bilirubin 0.3 - 1.2 mg/dL 0.5 0.5 0.2  Alkaline Phos 38 - 126 U/L 44 79 56  AST 15 - 41 U/L 14(L) 15 10  ALT 14 - 54 U/L 10(L) 13(L) 7    Lab Results  Component Value  Date   WBC 24.3 (H) 11/13/2017   HGB 5.6 Repeated and verified X2. (LL) 10/25/2017   HCT 23.9 (L) 11/13/2017   MCV 83.0 11/13/2017   PLT 246 11/13/2017   NEUTROABS 19.9 (H) 11/13/2017    ASSESSMENT & PLAN:  Acute blood loss anemia Severe iron deficiency anemia Hemoglobin 10/26/2017: 5.7 patient received 2 units of blood and Feraheme Hemoglobin 11/13/2017: Hemoglobin 7.1, MCV 83, WBC 24, platelets 246   Treatment options for heavy menstrual bleeding.:  Scheduled for ablation with her gynecologist  Return to clinic in 1 month with labs done ahead of time and follow-up.    DEEP VENOUS THROMBOPHLEBITIS, HX OF Recurrent DVT with antiphospholipid antibodies: First episode: 2007 after undergoing myomectomy, got anticoagulation initially with Coumadin and later with Lovenox for about 12 months ( Lovenox was given during her pregnancy in 2008 and discontinued after she delivered) Second episode: 2009 right leg DVT given Lovenox 6-7 months Third episodeFebruary 2015: Left leg DVT xarelto until November 2016 stopped for heavy bleeding Fourth episode January 2019: Happened when she stopped Xarelto due to heavy menstrual bleed. --------------------------------------------------------------- Repeat testing for lupus anticoagulant in February 2017 was negative. In spite of this. Recommended lifelong anticoagulation based on recurrent blood clots     I spent 25 minutes talking to the patient of which more than half was spent in counseling and coordination of care.  Orders Placed This Encounter  Procedures  . Iron and TIBC    Standing Status:   Future    Standing Expiration Date:   11/13/2018  . Ferritin    Standing Status:   Future    Standing Expiration Date:   11/13/2018  . CBC with Differential (Cancer Center Only)    Standing Status:   Future    Standing Expiration Date:   11/14/2018  . Sample to Blood Bank    Standing Status:   Future    Standing Expiration Date:   11/14/2018   The  patient has a good understanding of the overall plan. she agrees with it. she will call with any problems that may develop before the next visit here.   Harriette Ohara, MD 11/13/17

## 2017-11-13 NOTE — Assessment & Plan Note (Signed)
Severe iron deficiency anemia Hemoglobin 10/26/2017: 5.7 2 units of packed red cells along with 2 units of Feraheme were administered Treatment options for heavy menstrual bleeding.: Patient plans to discuss with her gynecologist  Return to clinic in 6 months with labs done ahead of time and follow-up.

## 2017-11-13 NOTE — Assessment & Plan Note (Signed)
Recurrent DVT with antiphospholipid antibodies: First episode: 2007 after undergoing myomectomy, got anticoagulation initially with Coumadin and later with Lovenox for about 12 months ( Lovenox was given during her pregnancy in 2008 and discontinued after she delivered) Second episode: 2009 right leg DVT given Lovenox 6-7 months Third episodeFebruary 2015: Left leg DVT xarelto until November 2016 stopped for heavy bleeding Fourth episode January 2019: Happened when she stopped Xarelto due to heavy menstrual bleed. --------------------------------------------------------------- Repeat testing for lupus anticoagulant in February 2017 was negative. In spite of this. Recommended lifelong anticoagulation based on recurrent blood clots

## 2017-11-14 ENCOUNTER — Telehealth: Payer: Self-pay | Admitting: Hematology and Oncology

## 2017-11-14 LAB — IRON AND TIBC
IRON: 8 ug/dL — AB (ref 41–142)
Saturation Ratios: 4 % — ABNORMAL LOW (ref 21–57)
TIBC: 236 ug/dL (ref 236–444)
UIBC: 228 ug/dL

## 2017-11-14 LAB — FERRITIN: Ferritin: 218 ng/mL (ref 9–269)

## 2017-11-14 NOTE — Telephone Encounter (Signed)
Mailed patient calendar of upcoming April appointments.

## 2017-11-15 ENCOUNTER — Inpatient Hospital Stay: Payer: BC Managed Care – PPO

## 2017-11-15 ENCOUNTER — Other Ambulatory Visit: Payer: Self-pay

## 2017-11-15 ENCOUNTER — Inpatient Hospital Stay (HOSPITAL_COMMUNITY)
Admission: EM | Admit: 2017-11-15 | Discharge: 2017-11-23 | DRG: 871 | Disposition: A | Discharge status: Home / Self Care | Payer: BC Managed Care – PPO | Attending: Nephrology | Admitting: Nephrology

## 2017-11-15 ENCOUNTER — Other Ambulatory Visit: Payer: Self-pay | Admitting: Medical

## 2017-11-15 ENCOUNTER — Telehealth: Payer: Self-pay

## 2017-11-15 ENCOUNTER — Encounter (HOSPITAL_COMMUNITY): Payer: Self-pay

## 2017-11-15 ENCOUNTER — Emergency Department (HOSPITAL_COMMUNITY): Payer: BC Managed Care – PPO

## 2017-11-15 ENCOUNTER — Inpatient Hospital Stay (HOSPITAL_BASED_OUTPATIENT_CLINIC_OR_DEPARTMENT_OTHER): Payer: BC Managed Care – PPO | Admitting: Medical

## 2017-11-15 VITALS — BP 121/80 | HR 147 | Temp 102.5°F

## 2017-11-15 VITALS — BP 121/81 | HR 147 | Temp 100.9°F | Resp 20

## 2017-11-15 DIAGNOSIS — N938 Other specified abnormal uterine and vaginal bleeding: Secondary | ICD-10-CM | POA: Diagnosis present

## 2017-11-15 DIAGNOSIS — A419 Sepsis, unspecified organism: Principal | ICD-10-CM | POA: Diagnosis present

## 2017-11-15 DIAGNOSIS — D500 Iron deficiency anemia secondary to blood loss (chronic): Secondary | ICD-10-CM | POA: Diagnosis present

## 2017-11-15 DIAGNOSIS — J029 Acute pharyngitis, unspecified: Secondary | ICD-10-CM

## 2017-11-15 DIAGNOSIS — Z6830 Body mass index (BMI) 30.0-30.9, adult: Secondary | ICD-10-CM

## 2017-11-15 DIAGNOSIS — D62 Acute posthemorrhagic anemia: Secondary | ICD-10-CM | POA: Diagnosis present

## 2017-11-15 DIAGNOSIS — R05 Cough: Secondary | ICD-10-CM | POA: Diagnosis not present

## 2017-11-15 DIAGNOSIS — D6861 Antiphospholipid syndrome: Secondary | ICD-10-CM | POA: Diagnosis present

## 2017-11-15 DIAGNOSIS — R509 Fever, unspecified: Secondary | ICD-10-CM | POA: Diagnosis not present

## 2017-11-15 DIAGNOSIS — R1084 Generalized abdominal pain: Secondary | ICD-10-CM

## 2017-11-15 DIAGNOSIS — Z86718 Personal history of other venous thrombosis and embolism: Secondary | ICD-10-CM

## 2017-11-15 DIAGNOSIS — Z79899 Other long term (current) drug therapy: Secondary | ICD-10-CM

## 2017-11-15 DIAGNOSIS — I82220 Acute embolism and thrombosis of inferior vena cava: Secondary | ICD-10-CM | POA: Diagnosis present

## 2017-11-15 DIAGNOSIS — R5383 Other fatigue: Secondary | ICD-10-CM | POA: Diagnosis not present

## 2017-11-15 DIAGNOSIS — D649 Anemia, unspecified: Secondary | ICD-10-CM | POA: Diagnosis present

## 2017-11-15 DIAGNOSIS — N898 Other specified noninflammatory disorders of vagina: Secondary | ICD-10-CM | POA: Diagnosis not present

## 2017-11-15 DIAGNOSIS — R0602 Shortness of breath: Secondary | ICD-10-CM | POA: Diagnosis not present

## 2017-11-15 DIAGNOSIS — Z833 Family history of diabetes mellitus: Secondary | ICD-10-CM

## 2017-11-15 DIAGNOSIS — D72823 Leukemoid reaction: Secondary | ICD-10-CM | POA: Diagnosis not present

## 2017-11-15 DIAGNOSIS — R591 Generalized enlarged lymph nodes: Secondary | ICD-10-CM | POA: Diagnosis present

## 2017-11-15 DIAGNOSIS — Z7901 Long term (current) use of anticoagulants: Secondary | ICD-10-CM | POA: Diagnosis not present

## 2017-11-15 DIAGNOSIS — N131 Hydronephrosis with ureteral stricture, not elsewhere classified: Secondary | ICD-10-CM | POA: Diagnosis present

## 2017-11-15 DIAGNOSIS — D251 Intramural leiomyoma of uterus: Secondary | ICD-10-CM | POA: Diagnosis present

## 2017-11-15 DIAGNOSIS — R531 Weakness: Secondary | ICD-10-CM

## 2017-11-15 DIAGNOSIS — E876 Hypokalemia: Secondary | ICD-10-CM | POA: Diagnosis not present

## 2017-11-15 DIAGNOSIS — R52 Pain, unspecified: Secondary | ICD-10-CM

## 2017-11-15 DIAGNOSIS — E440 Moderate protein-calorie malnutrition: Secondary | ICD-10-CM

## 2017-11-15 DIAGNOSIS — E669 Obesity, unspecified: Secondary | ICD-10-CM | POA: Diagnosis present

## 2017-11-15 DIAGNOSIS — R42 Dizziness and giddiness: Secondary | ICD-10-CM | POA: Diagnosis not present

## 2017-11-15 DIAGNOSIS — D508 Other iron deficiency anemias: Secondary | ICD-10-CM

## 2017-11-15 DIAGNOSIS — I82409 Acute embolism and thrombosis of unspecified deep veins of unspecified lower extremity: Secondary | ICD-10-CM | POA: Diagnosis not present

## 2017-11-15 DIAGNOSIS — R1031 Right lower quadrant pain: Secondary | ICD-10-CM | POA: Diagnosis present

## 2017-11-15 DIAGNOSIS — N7093 Salpingitis and oophoritis, unspecified: Secondary | ICD-10-CM

## 2017-11-15 DIAGNOSIS — E861 Hypovolemia: Secondary | ICD-10-CM | POA: Diagnosis not present

## 2017-11-15 DIAGNOSIS — N920 Excessive and frequent menstruation with regular cycle: Secondary | ICD-10-CM | POA: Diagnosis not present

## 2017-11-15 LAB — PREPARE RBC (CROSSMATCH)

## 2017-11-15 LAB — CBC WITH DIFFERENTIAL/PLATELET
BASOS PCT: 0 %
Basophils Absolute: 0 10*3/uL (ref 0.0–0.1)
EOS ABS: 0 10*3/uL (ref 0.0–0.7)
Eosinophils Relative: 0 %
HEMATOCRIT: 20.6 % — AB (ref 36.0–46.0)
Hemoglobin: 6.1 g/dL — CL (ref 12.0–15.0)
LYMPHS ABS: 2.2 10*3/uL (ref 0.7–4.0)
Lymphocytes Relative: 9 %
MCH: 24.6 pg — AB (ref 26.0–34.0)
MCHC: 29.6 g/dL — AB (ref 30.0–36.0)
MCV: 83.1 fL (ref 78.0–100.0)
MONO ABS: 3.5 10*3/uL — AB (ref 0.1–1.0)
Monocytes Relative: 14 %
NRBC: 1 /100{WBCs} — AB
Neutro Abs: 19.1 10*3/uL — ABNORMAL HIGH (ref 1.7–7.7)
Neutrophils Relative %: 77 %
PLATELETS: 306 10*3/uL (ref 150–400)
RBC: 2.48 MIL/uL — ABNORMAL LOW (ref 3.87–5.11)
RDW: 19.5 % — AB (ref 11.5–15.5)
WBC: 24.8 10*3/uL — ABNORMAL HIGH (ref 4.0–10.5)

## 2017-11-15 LAB — IRON AND TIBC
IRON: 16 ug/dL — AB (ref 41–142)
Saturation Ratios: 6 % — ABNORMAL LOW (ref 21–57)
TIBC: 260 ug/dL (ref 236–444)
UIBC: 244 ug/dL

## 2017-11-15 LAB — URINALYSIS, ROUTINE W REFLEX MICROSCOPIC
BACTERIA UA: NONE SEEN
Bilirubin Urine: NEGATIVE
Glucose, UA: NEGATIVE mg/dL
KETONES UR: NEGATIVE mg/dL
Nitrite: NEGATIVE
PH: 6 (ref 5.0–8.0)
Protein, ur: 30 mg/dL — AB
Specific Gravity, Urine: 1.015 (ref 1.005–1.030)

## 2017-11-15 LAB — COMPREHENSIVE METABOLIC PANEL
ALBUMIN: 2.2 g/dL — AB (ref 3.5–5.0)
ALK PHOS: 77 U/L (ref 38–126)
ALT: 18 U/L (ref 14–54)
ANION GAP: 13 (ref 5–15)
AST: 23 U/L (ref 15–41)
BUN: 13 mg/dL (ref 6–20)
CALCIUM: 8.5 mg/dL — AB (ref 8.9–10.3)
CO2: 25 mmol/L (ref 22–32)
CREATININE: 0.99 mg/dL (ref 0.44–1.00)
Chloride: 95 mmol/L — ABNORMAL LOW (ref 101–111)
GFR calc non Af Amer: >60 mL/min (ref >60)
GLUCOSE: 123 mg/dL — AB (ref 65–99)
Potassium: 3.4 mmol/L — ABNORMAL LOW (ref 3.5–5.1)
SODIUM: 133 mmol/L — AB (ref 135–145)
TOTAL PROTEIN: 7.5 g/dL (ref 6.5–8.1)
Total Bilirubin: 0.6 mg/dL (ref 0.3–1.2)

## 2017-11-15 LAB — CBC WITH DIFFERENTIAL (CANCER CENTER ONLY)
BASOS PCT: 0 %
Basophils Absolute: 0 10*3/uL (ref 0.0–0.1)
EOS ABS: 0.1 10*3/uL (ref 0.0–0.5)
Eosinophils Relative: 1 %
HEMATOCRIT: 20.7 % — AB (ref 34.8–46.6)
HEMOGLOBIN: 6.4 g/dL — AB (ref 11.6–15.9)
Lymphocytes Relative: 8 %
Lymphs Abs: 1.7 10*3/uL (ref 0.9–3.3)
MCH: 24.5 pg — ABNORMAL LOW (ref 25.1–34.0)
MCHC: 30.8 g/dL — AB (ref 31.5–36.0)
MCV: 79.5 fL (ref 79.5–101.0)
MONOS PCT: 13 %
Monocytes Absolute: 2.8 10*3/uL — ABNORMAL HIGH (ref 0.1–0.9)
NEUTROS ABS: 16.5 10*3/uL — AB (ref 1.5–6.5)
NEUTROS PCT: 78 %
Platelet Count: 291 10*3/uL (ref 145–400)
RBC: 2.61 MIL/uL — ABNORMAL LOW (ref 3.70–5.45)
RDW: 21.6 % — ABNORMAL HIGH (ref 11.2–14.5)
WBC Count: 21.1 10*3/uL — ABNORMAL HIGH (ref 3.9–10.3)

## 2017-11-15 LAB — BPAM RBC
BLOOD PRODUCT EXPIRATION DATE: 201904082359
BLOOD PRODUCT EXPIRATION DATE: 201904092359
UNIT TYPE AND RH: 7300
Unit Type and Rh: 7300

## 2017-11-15 LAB — TYPE AND SCREEN
ABO/RH(D): B POS
Antibody Screen: POSITIVE
UNIT DIVISION: 0
Unit division: 0

## 2017-11-15 LAB — I-STAT CG4 LACTIC ACID, ED: Lactic Acid, Venous: 1.42 mmol/L (ref 0.5–1.9)

## 2017-11-15 LAB — LIPASE, BLOOD: Lipase: 14 U/L (ref 11–51)

## 2017-11-15 LAB — I-STAT BETA HCG BLOOD, ED (MC, WL, AP ONLY): I-stat hCG, quantitative: 11.7 m[IU]/mL — ABNORMAL HIGH (ref <5)

## 2017-11-15 LAB — INFLUENZA PANEL BY PCR (TYPE A & B)
Influenza A By PCR: NEGATIVE
Influenza B By PCR: NEGATIVE

## 2017-11-15 LAB — FERRITIN: FERRITIN: 273 ng/mL — AB (ref 9–269)

## 2017-11-15 MED ORDER — SODIUM CHLORIDE 0.9 % IV BOLUS (SEPSIS)
1000.0000 mL | Freq: Once | INTRAVENOUS | Status: AC
  Administered 2017-11-15: 1000 mL via INTRAVENOUS

## 2017-11-15 MED ORDER — OXYCODONE-ACETAMINOPHEN 5-325 MG PO TABS
ORAL_TABLET | ORAL | Status: AC
  Filled 2017-11-15: qty 2

## 2017-11-15 MED ORDER — IOPAMIDOL (ISOVUE-300) INJECTION 61%
INTRAVENOUS | Status: AC
  Administered 2017-11-15: 100 mL via INTRAVENOUS
  Filled 2017-11-15: qty 100

## 2017-11-15 MED ORDER — PIPERACILLIN-TAZOBACTAM 3.375 G IVPB
3.3750 g | Freq: Three times a day (TID) | INTRAVENOUS | Status: DC
  Administered 2017-11-16 – 2017-11-19 (×11): 3.375 g via INTRAVENOUS
  Filled 2017-11-15 (×11): qty 50

## 2017-11-15 MED ORDER — ACETAMINOPHEN 500 MG PO TABS
1000.0000 mg | ORAL_TABLET | Freq: Four times a day (QID) | ORAL | Status: DC | PRN
Start: 2017-11-15 — End: 2017-11-23
  Administered 2017-11-16: 1000 mg via ORAL
  Filled 2017-11-15: qty 2

## 2017-11-15 MED ORDER — SODIUM CHLORIDE 0.9 % IJ SOLN
INTRAMUSCULAR | Status: AC
  Filled 2017-11-15: qty 50

## 2017-11-15 MED ORDER — FENTANYL CITRATE (PF) 100 MCG/2ML IJ SOLN
50.0000 ug | Freq: Once | INTRAMUSCULAR | Status: AC
  Administered 2017-11-15: 50 ug via INTRAVENOUS
  Filled 2017-11-15: qty 2

## 2017-11-15 MED ORDER — MORPHINE SULFATE (PF) 4 MG/ML IV SOLN
2.0000 mg | INTRAVENOUS | Status: DC | PRN
  Administered 2017-11-16 – 2017-11-19 (×13): 2 mg via INTRAVENOUS
  Filled 2017-11-15 (×13): qty 1

## 2017-11-15 MED ORDER — ONDANSETRON HCL 4 MG/2ML IJ SOLN: 4.0000 mg | Freq: Four times a day (QID) | INTRAMUSCULAR | Status: DC | PRN

## 2017-11-15 MED ORDER — VANCOMYCIN HCL IN DEXTROSE 1-5 GM/200ML-% IV SOLN: 1000.0000 mg | Freq: Once | INTRAVENOUS | Status: DC

## 2017-11-15 MED ORDER — IBUPROFEN 200 MG PO TABS: 400.0000 mg | ORAL_TABLET | Freq: Four times a day (QID) | ORAL | Status: DC | PRN

## 2017-11-15 MED ORDER — SODIUM CHLORIDE 0.9 % IV BOLUS (SEPSIS)
250.0000 mL | Freq: Once | INTRAVENOUS | Status: AC
  Administered 2017-11-15: 250 mL via INTRAVENOUS

## 2017-11-15 MED ORDER — FERROUS SULFATE 325 (65 FE) MG PO TABS
325.0000 mg | ORAL_TABLET | Freq: Every day | ORAL | Status: DC
Start: 2017-11-16 — End: 2017-11-23
  Administered 2017-11-16 – 2017-11-23 (×8): 325 mg via ORAL
  Filled 2017-11-15 (×9): qty 1

## 2017-11-15 MED ORDER — ACETAMINOPHEN 325 MG PO TABS
650.0000 mg | ORAL_TABLET | Freq: Once | ORAL | Status: AC
  Administered 2017-11-15: 650 mg via ORAL
  Filled 2017-11-15: qty 2

## 2017-11-15 MED ORDER — SODIUM CHLORIDE 0.9 % IV SOLN
10.0000 mL/h | Freq: Once | INTRAVENOUS | Status: AC
  Administered 2017-11-15: 10 mL/h via INTRAVENOUS

## 2017-11-15 MED ORDER — SODIUM CHLORIDE 0.9% FLUSH
3.0000 mL | Freq: Two times a day (BID) | INTRAVENOUS | Status: DC
  Administered 2017-11-16 – 2017-11-22 (×9): 3 mL via INTRAVENOUS

## 2017-11-15 MED ORDER — LACTATED RINGERS IV SOLN
INTRAVENOUS | Status: DC
  Administered 2017-11-16 – 2017-11-20 (×9): via INTRAVENOUS

## 2017-11-15 MED ORDER — VANCOMYCIN HCL 10 G IV SOLR
2000.0000 mg | Freq: Once | INTRAVENOUS | Status: AC
  Administered 2017-11-15: 2000 mg via INTRAVENOUS
  Filled 2017-11-15: qty 2000

## 2017-11-15 MED ORDER — OXYCODONE-ACETAMINOPHEN 5-325 MG PO TABS: 2.0000 | ORAL_TABLET | Freq: Once | ORAL | Status: DC

## 2017-11-15 MED ORDER — OXYCODONE-ACETAMINOPHEN 5-325 MG PO TABS
2.0000 | ORAL_TABLET | Freq: Once | ORAL | Status: AC
  Administered 2017-11-15: 2 via ORAL

## 2017-11-15 MED ORDER — PIPERACILLIN-TAZOBACTAM 3.375 G IVPB 30 MIN
3.3750 g | Freq: Once | INTRAVENOUS | Status: AC
  Administered 2017-11-15: 3.375 g via INTRAVENOUS
  Filled 2017-11-15: qty 50

## 2017-11-15 MED ORDER — MEGESTROL ACETATE 40 MG PO TABS
80.0000 mg | ORAL_TABLET | Freq: Two times a day (BID) | ORAL | Status: DC
  Administered 2017-11-16 – 2017-11-23 (×15): 80 mg via ORAL
  Filled 2017-11-15 (×17): qty 2

## 2017-11-15 MED ORDER — ONDANSETRON HCL 4 MG PO TABS: 4.0000 mg | ORAL_TABLET | Freq: Four times a day (QID) | ORAL | Status: DC | PRN

## 2017-11-15 NOTE — ED Notes (Signed)
This nurse remained at patient's bedside for the entirety of the first 15 minutes of blood transfusion. Patient tolerated well. VSS.

## 2017-11-15 NOTE — Progress Notes (Signed)
Symptoms Management Clinic Progress Note   [name] [id number] [birth year] 42 y.o.  [name] is managed by Dr. Nicholas Lose  Assessment: Plan:    DUB (dysfunctional uterine bleeding)  Fever, unspecified fever cause  Leukemoid reaction  Generalized abdominal pain  Symptomatic anemia   Symptomatic anemia due to dysfunctional uterine bleeding: Patient's hemoglobin today is 6.4 with a hematocrit of 20.7.  She presents to the office today for a transfusion of 2 units of packed red blood cells.  She is status post uterine ablation and is pending an embolization of her fibroids under the care of her gynecologist next week.  Fever with leukemoid reaction: The patient's CBC returned today with a WBC of 21.1 with an ANC of 16.5.  The patient was noted to have a fever of 102.5 when she presented to the clinic today with tachycardia at 147.  She reports having intermittent fevers since this past weekend.  She was given Percocet 5-325, 2 tablets p.o. x1 for her fever and for her abdominal pain.  She is being transported to the emergency room for evaluation and management.  Generalized abdominal pain: She reports that her abdominal pain is 8/10 today.  The severity of her pain is worse today than usual.  She was given Percocet 5-325, 2 tablets p.o. X1.  Her abdominal exam was positive for a a rigid abdomen.  Patient does have a history of extensive fibroids however given her leukocytosis and fever it was my concern that her abdominal exam could be concealing an occult etiology for her current symptoms.  Given that she is being transported to the emergency room for evaluation and management.  Please see After Visit Summary for patient specific instructions.   Future Appointments  Date Time Provider [place]  11/20/2017 11:30 AM [facility] ROOM [facility] None  11/20/2017  1:30 PM [facility] 1 [facility] [place]  12/11/2017  2:45 PM [facility] LAB 6 [facility] None  12/11/2017  3:15  PM Nicholas Lose, MD [facility] None  03/12/2018 10:30 AM Venia Carbon, MD [facility]  04/08/2018  8:30 AM [facility] LAB 5 [facility] None  04/08/2018  9:00 AM Nicholas Lose, MD [facility] None  04/08/2018 10:00 AM [facility] B6 [facility] None    No orders of the defined types were placed in this encounter.      Subjective:   Patient ID:  [name] is a 42 y.o. (DOB 07-Jun-1976) female.  Chief Complaint: No chief complaint on file.   HPI [name] is a 42 year old female with a history of anemia secondary to abnormal uterine bleeding due to fibroids.  She has had a uterine ablation.  Despite this she continues to have abnormal uterine bleeding.  She is pending an embolization of her fibroids under the care of her gynecologist next week.  She presented to the office today for a transfusion of 2 units of packed red blood cells for her hemoglobin of 6.4 and hematocrit of 20.7.  When she arrived she was noted to be exceedingly weak which was greater than her general baseline.  She was seen in the infusion room and was found to have a fever of 102.5 and tachycardia at 147.  She was brought to the symptom management clinic to be seen by this provider.  She was found to be tachycardic with abdominal pain of 8/10.  She reported that her abdominal pain was greater than usual.  She also reported that she had been having intermittent fevers since last  weekend.  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  She had a recent nosebleed.  She reports mild vaginal discharge, a sore throat, lightheadedness, dizziness, shortness of breath, and a productive cough with greenish sputum.  No one else in her home has been sick.    Medications: I have reviewed the patient's current medications.  Allergies: No Known Allergies  Past Medical History:  Diagnosis Date  . Anemia 09/2017   REQUIRING A TRANSFUSION  . Antiphospholipid syndrome ([place])   . Breast cyst    BILATERAL  . DVT  (deep venous thrombosis) ([place])   . Dysfunctional uterine bleeding   . Fibroids   . Obesity     Past Surgical History:  Procedure Laterality Date  . CESAREAN SECTION    . DILITATION & CURRETTAGE/HYSTROSCOPY WITH HYDROTHERMAL ABLATION N/A 10/08/2014   Procedure: DILATATION & CURETTAGE/HYSTEROSCOPY WITH HYDROTHERMAL ABLATION;  Surgeon: Osborne Oman, MD;  Location: [facility];  Service: Gynecology;  Laterality: N/A;  . IR RADIOLOGIST EVAL & MGMT  10/10/2017  . MYOMECTOMY      Family History  Problem Relation Age of Onset  . Diabetes Paternal Grandmother   . Heart disease Neg Hx   . Hypertension Neg Hx   . Cancer Neg Hx        breast or colon cancer    Social History   Socioeconomic History  . Marital status: Married    Spouse name: Not on file  . Number of children: 1  . Years of education: Not on file  . Highest education level: Not on file  Occupational History  . 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 Financial resource strain: Not on file  . Food insecurity:    Worry: Not on file    Inability: Not on file  . Transportation needs:    Medical: Not on file    Non-medical: Not on file  Tobacco Use  . Smoking status: Never Smoker  . Smokeless tobacco: Never Used  Substance and Sexual Activity  . Alcohol use: Yes    Comment: rare  . Drug use: No  . Sexual activity: Yes    Partners: Male    Birth control/protection: None  Lifestyle  . Physical activity:    Days per week: Not on file    Minutes per session: Not on file  . Stress: Not on file  Relationships  . Social connections:    Talks on phone: Not on file    Gets together: Not on file    Attends religious service: Not on file    Active member of club or organization: Not on file    Attends meetings of clubs or organizations: Not on file    Relationship status: Not on file  . Intimate partner violence:    Fear of current or ex partner: Not on file    Emotionally abused:  Not on file    Physically abused: Not on file    Forced sexual activity: Not on file  Other Topics Concern  . Not on file  Social History Narrative  . Not on file    Past Medical History, Surgical history, Social history, and Family history were reviewed and updated as appropriate.   Please see review of systems for further details on the patient's review from today.   Review of Systems:  Review of Systems  Constitutional: Positive for fatigue and fever. Negative for chills and diaphoresis.  HENT: Positive for  sore throat. Negative for rhinorrhea, sinus pressure, sinus pain and trouble swallowing.   Respiratory: Positive for cough and shortness of breath. Negative for choking, chest tightness and wheezing.   Cardiovascular: Positive for palpitations. Negative for chest pain.  Gastrointestinal: Positive for abdominal pain. Negative for constipation, diarrhea, nausea and vomiting.  Genitourinary: Positive for vaginal discharge. Negative for difficulty urinating, dysuria, hematuria and vaginal bleeding.  Neurological: Positive for dizziness, weakness and light-headedness.    Objective:   Physical Exam:  There were no vitals taken for this visit. ECOG: 1  Physical Exam  Constitutional: No distress.  HENT:  Head: Normocephalic and atraumatic.  Mouth/Throat: Oropharynx is clear and moist. No oropharyngeal exudate.  Auditory canals are occluded with dark brown cerumen bilaterally.  Eyes: Right eye exhibits no discharge. Left eye exhibits no discharge.  Neck: Normal range of motion. Neck supple.  Cardiovascular: S1 normal and S2 normal. Tachycardia present.  Pulmonary/Chest: Effort normal. She has no wheezes. She has no rales.      Abdominal: She exhibits distension and mass. There is tenderness. There is no rebound and no guarding.    Lymphadenopathy:    She has no cervical adenopathy.  Neurological: She is alert.  Skin: Skin is warm and dry. She is not diaphoretic.     Lab Review:     Component Value Date/Time   NA 138 10/02/2017 0814   K 3.5 10/02/2017 0814   CL 105 10/02/2017 0814   CO2 19 (L) 10/02/2017 0814   GLUCOSE 132 (H) 10/02/2017 0814   BUN 7 10/02/2017 0814   CREATININE 0.90 10/02/2017 0814   CALCIUM 8.9 10/02/2017 0814   PROT 7.2 10/02/2017 0814   ALBUMIN 3.3 (L) 10/02/2017 0814   AST 14 (L) 10/02/2017 0814   ALT 10 (L) 10/02/2017 0814   ALKPHOS 44 10/02/2017 0814   BILITOT 0.5 10/02/2017 0814   GFRNONAA >60 10/02/2017 0814   GFRAA >60 10/02/2017 0814       Component Value Date/Time   WBC 21.1 (H) 11/15/2017 1110   WBC 7.7 10/25/2017 1018   RBC 2.61 (L) 11/15/2017 1110   HGB 5.6 Repeated and verified X2. (LL) 10/25/2017 1018   HGB 9.3 (L) 07/17/2017 1606   HCT 20.7 (L) 11/15/2017 1110   HCT 30.3 (L) 07/17/2017 1606   PLT 291 11/15/2017 1110   PLT 256 07/17/2017 1606   MCV 79.5 11/15/2017 1110   MCV 70.4 (L) 07/17/2017 1606   MCH 24.5 (L) 11/15/2017 1110   MCHC 30.8 (L) 11/15/2017 1110   RDW 21.6 (H) 11/15/2017 1110   RDW 20.9 (H) 07/17/2017 1606   LYMPHSABS 1.7 11/15/2017 1110   LYMPHSABS 2.0 07/17/2017 1606   MONOABS 2.8 (H) 11/15/2017 1110   MONOABS 0.5 07/17/2017 1606   EOSABS 0.1 11/15/2017 1110   EOSABS 0.1 07/17/2017 1606   BASOSABS 0.0 11/15/2017 1110   BASOSABS 0.0 07/17/2017 1606   -------------------------------  Imaging from last 24 hours (if applicable):  Radiology interpretation: Mr Pelvis W Wo Contrast  Result Date: 10/22/2017 CLINICAL DATA:  Abnormal uterine bleeding. Anemia. Fibroids. Evaluation for uterine artery embolization. EXAM: MRI PELVIS WITHOUT AND WITH CONTRAST TECHNIQUE: Multiplanar multisequence MR imaging of the pelvis was performed both before and after administration of intravenous contrast. CONTRAST:  59m MULTIHANCE GADOBENATE DIMEGLUMINE 529 MG/ML IV SOLN COMPARISON:  CT on 08/02/2017 FINDINGS: Urinary Tract: No bladder or urethral abnormality identified. Bowel:  Unremarkable  visualized pelvic bowel loops. Vascular/Lymphatic: No pathologically enlarged lymph nodes or other significant abnormality. Reproductive: --  Uterus: Measures 16.9 x 11.3 by 16.5 cm (volume = 1650 cm^3). A few small intramural fibroids are seen in the right and left uterine corpus measuring up to 2 cm in size. Diffuse thickening of the myometrial junctional zone is seen with a large adenomyoma in the posterior uterine corpus and fundus, measuring 10.5 x 6.1 x 12.2 cm. Diffuse endometrial thickening is seen measuring 32 mm, with low T2 signal blood products throughout the endometrial cavity which shows no contrast enhancement. -- Intracavitary fibroids:  None. -- Pedunculated fibroids: None. -- Fibroid contrast enhancement: Small fibroids described above show no significant contrast enhancement, consistent with internal degeneration. -- Right ovary: Appears normal. A few T1 hyperintense implants are seen in the right adnexa adjacent to the uterine wall, consistent with endometriosis. -- Left ovary: 2.5 cm T1 hyperintense hemorrhagic left ovarian cyst. A moderate left hematosalpinx is seen which shows T1 hyperintensity and T2 shading. Other: Small amount of free fluid. Small paraumbilical hernia containing only fat. Musculoskeletal: Diffuse bone marrow T2 hypointensity, consistent with hemosiderosis. 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 Approximately 3 small degenerated intramural fibroids measuring approximately 2 cm, which show lack of contrast enhancement. Diffuse endometrial thickening with blood clot throughout the endometrial cavity. Large left hematosalpinx.  Mild endometriosis in the right adnexa. Electronically Signed   By: Earle Gell M.D.   On: 10/22/2017 09:46        This case was discussed with Dr. Lindi Adie. He expresses agreement with my management of this patient.

## 2017-11-15 NOTE — ED Notes (Signed)
Second attempt made to call report. RN will call back in about 5 minutes.

## 2017-11-15 NOTE — ED Notes (Signed)
Patient transported to CT 

## 2017-11-15 NOTE — Telephone Encounter (Signed)
Infusion Charge RN called reporting patients symptoms of increased HR 155, fever 102.5. Patient is in need of 2 units of blood today with Hgb 6.4. Per Dr. Lindi Adie patient to move to symptom management to be seen by Lucianne Lei, Pa. Amy, Charge RN aware.  Cyndia Bent RN

## 2017-11-15 NOTE — Progress Notes (Signed)
Pt came in with oral  temp of 102.5, HR at 145-147, look so weak,PIV was started. Pt states the she needs to see a doctor, Charge RN called Dr. Lindi Adie.and Lucianne Lei was paged. Pt was transferred to Symptom management.

## 2017-11-15 NOTE — Progress Notes (Signed)
32 Pt arrived to [facility] via w/c from infusion for Summer Mealy, PA to evaluate for fever, tachycardia.  1355  Peripheral IV capped. Transported via w/c to ED room # 6. Report given to RN

## 2017-11-15 NOTE — ED Notes (Signed)
Patient's elevated temperature verbally reported to [place], [place]. Patient denies itching, rash, hypotension, oral swelling, or other signs of adverse reaction to blood transfusion. Patient reports having multiple transfusions previously without adverse reaction. PO Acetaminophen ordered by PA. [place] notified.

## 2017-11-15 NOTE — Progress Notes (Signed)
A consult was received from an ED physician for Vancomycin & Zosyn per pharmacy dosing.  The patient's profile has been reviewed for ht/wt/allergies/indication/available labs.   A one time order has been placed for Zosyn 3.375gm & Vancomycin 2gm IV.  Further antibiotics/pharmacy consults should be ordered by admitting physician if indicated.                       Thank you, Biagio Borg 11/15/2017  2:52 PM

## 2017-11-15 NOTE — ED Triage Notes (Signed)
Patient brought in by Cancer center staff in wheelchair uterine fibroids and acute on chronic anemia. Patient was to get 2u PRBC. Patient with complaints of low grade temperature, tachycardia, and elevated WBC. Patient scheduled to have procedure next week to ablate the fibroids. Patient endorses productive cough with green sputum. 22g Left Forearm PIV placed in [place]. Patient was administered x2 Percocet 5/325 approx 1315- patient denies relief. Patient c/o lower abdominal pain, but denies dysuria.

## 2017-11-15 NOTE — H&P (Signed)
History and Physical    [name] HKV:[id number] DOB: 1976-07-12 DOA: 11/15/2017  PCP: Venia Carbon, MD Consultants:  Harolyn Rutherford - OB/GYN; Lindi Adie - hematology Patient coming from:  Home - lives with husband and daughter; NOK: husband, [phone]  Chief Complaint: abdominal pain  HPI: [name] is a 42 y.o. female with medical history significant of dysfunctional uterine bleeding associated with fibroids and B DVT associated with antiphospholipid syndrome presenting with abdominal pain.  She has been trying to wait for an embolization next week but she is having deep shooting pains in her abdomen and fevers.  She is having lots of pain.  Bleeding is better but still ongoing.  The pain started getting worse about 1-2 weeks ago.  Fever started about 1 weekend ago, fever up to 101+, intermittent.  +N/V x 1 1/2 weeks.  No LE symptoms. She was previously hospitalized for bleeding clots, requiring her to bring extra clothes to work and was really uncomfortable - the bleeding has improved somewhat.   Bleeding "it's not bad bad like it was", changing 1-2 times every 2-3 hours.  She wears Depends with pads in them and still has to put paper towels inside.  Slight SOB.  Some dizziness. She did miss one day of Xarelto due to illness but otherwise has not missed doses.  She previously had a uterine ablation and is planned for endometrial embolization later this month and has been receiving iron infusions in addition to taking orla iron and Megace.  She was transfused 2 units by Dr. Lindi Adie on 3/19 and was also given 2 units of Feraheme.  She has had 4 episodes of DVT (2007 after myomectomy - Coumadin/Lovenox x 12 months; 2009 - Lovenox x 6-7 months; 2015 - Xarelto, stopped in 11/16 due to heavy menstrual bleeding; 1/19 - also from stopping Xarelto due to heavy menstrual bleeding) - she has been recommended to have lifelong anticoagulation.  ED Course:   Symptomatic anemia.  Spoke with Dr Rosana Hoes  - initial thought was for transfer.  She called back and reported an IVC thrombus.  Recommendation was to address the thrombus.  GYN will consult.  Vascular surgery, Dr. Donzetta Matters will see the patient.  Goals: infection control with IV antibiotics, vascular to address thrombus, GYN consult for drainage of abscess tomorrow, transfuse 2 units PRBC.  Review of Systems: As per HPI; otherwise review of systems reviewed and negative.   Ambulatory Status:  Ambulates without assistance  Past Medical History:  Diagnosis Date  . Anemia 09/2017   REQUIRING A TRANSFUSION  . Antiphospholipid syndrome ([place])   . Breast cyst    BILATERAL  . DVT (deep venous thrombosis) ([place])   . Dysfunctional uterine bleeding   . Fibroids   . Obesity     Past Surgical History:  Procedure Laterality Date  . CESAREAN SECTION    . DILITATION & CURRETTAGE/HYSTROSCOPY WITH HYDROTHERMAL ABLATION N/A 10/08/2014   Procedure: DILATATION & CURETTAGE/HYSTEROSCOPY WITH HYDROTHERMAL ABLATION;  Surgeon: Osborne Oman, MD;  Location: [facility];  Service: Gynecology;  Laterality: N/A;  . IR RADIOLOGIST EVAL & MGMT  10/10/2017  . MYOMECTOMY      Social History   Socioeconomic History  . Marital status: Married    Spouse name: Not on file  . Number of children: 1  . Years of education: Not on file  . Highest education level: Not on file  Occupational History  . 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 Financial resource strain: Not on file  . Food insecurity:    Worry: Not on file    Inability: Not on file  . Transportation needs:    Medical: Not on file    Non-medical: Not on file  Tobacco Use  . Smoking status: Never Smoker  . Smokeless tobacco: Never Used  Substance and Sexual Activity  . Alcohol use: Yes    Comment: rare  . Drug use: No  . Sexual activity: Yes    Partners: Male    Birth control/protection: None  Lifestyle  . Physical activity:    Days per week: Not on file     Minutes per session: Not on file  . Stress: Not on file  Relationships  . Social connections:    Talks on phone: Not on file    Gets together: Not on file    Attends religious service: Not on file    Active member of club or organization: Not on file    Attends meetings of clubs or organizations: Not on file    Relationship status: Not on file  . Intimate partner violence:    Fear of current or ex partner: Not on file    Emotionally abused: Not on file    Physically abused: Not on file    Forced sexual activity: Not on file  Other Topics Concern  . Not on file  Social History Narrative  . Not on file    No Known Allergies  Family History  Problem Relation Age of Onset  . Diabetes Paternal Grandmother   . Heart disease Neg Hx   . Hypertension Neg Hx   . Cancer Neg Hx        breast or colon cancer    Prior to Admission medications   Medication Sig Start Date End Date Taking? Authorizing Provider  acetaminophen (TYLENOL) 500 MG tablet Take 1,000 mg by mouth every 6 (six) hours as needed for fever.   Yes [provider]  Ferrous Sulfate (IRON) 325 (65 Fe) MG TABS Take 1 tablet (325 mg total) by mouth daily. 10/22/17  Yes Venia Carbon, MD  megestrol (MEGACE) 40 MG tablet Take 2 tablets (80 mg total) by mouth 2 (two) times daily. 10/08/17  Yes Anyanwu, Sallyanne Havers, MD  Multiple Vitamin (MULTIVITAMIN WITH MINERALS) TABS tablet Take 1 tablet by mouth daily.   Yes [provider]  traMADol (ULTRAM) 50 MG tablet Take 1 tablet (50 mg total) by mouth 3 (three) times daily as needed. Patient taking differently: Take 50 mg by mouth 3 (three) times daily as needed for moderate pain.  11/12/17  Yes Venia Carbon, MD  XARELTO 20 MG TABS tablet TAKE 1 TABLET (20 MG TOTAL) BY MOUTH DAILY WITH SUPPER. 09/13/17  Yes Venia Carbon, MD    Physical Exam: Vitals:   11/15/17 2220 11/15/17 2227 11/15/17 2230 11/15/17 2300  BP:   109/61 127/71  Pulse: (!) 131  (!) 131  (!) 125  Resp: (!) 24  (!) 21 (!) 22  Temp:  (!) 102.1 F (38.9 C)    TempSrc:  Oral    SpO2: 100%  100% 100%     General:  Appears calm and comfortable and is NAD Eyes:  PERRL, EOMI, normal lids, iris ENT:  grossly normal hearing, lips & tongue, mmm; appropriate dentition Neck:  no LAD, masses or thyromegaly; no carotid bruits Cardiovascular:  Tachycardia, no m/r/g.  Respiratory:   CTA bilaterally with no  wheezes/rales/rhonchi.  Normal respiratory effort. Abdomen:  Taut, distended, diffusely TTP with peritoneal signs Skin:  no rash or induration seen on limited exam Musculoskeletal:  grossly normal tone BUE/BLE, good ROM, no bony abnormality.  LLE does appear to be slightly larger than the R but it is not erythematous, no palpable cords, etc. Psychiatric:  grossly normal mood and affect, speech fluent and appropriate, AOx3 Neurologic:  CN 2-12 grossly intact, moves all extremities in coordinated fashion, sensation intact    Radiological Exams on Admission: Dg Chest 2 View  Result Date: 11/15/2017 CLINICAL DATA:  Low-grade fever, tachycardia, leukocytosis EXAM: CHEST - 2 VIEW COMPARISON:  CT chest 04/08/2008 FINDINGS: Normal heart size, mediastinal contours, and pulmonary vascularity. Lungs clear. No pulmonary infiltrate, pleural effusion or pneumothorax. Bones unremarkable. IMPRESSION: No acute abnormalities. Electronically Signed   By: Lavonia Dana M.D.   On: 11/15/2017 16:43   Ct Abdomen Pelvis W Contrast  Result Date: 11/15/2017 CLINICAL DATA:  Fever, tachycardia, and leukocytosis. History of uterine fibroids. EXAM: CT ABDOMEN AND PELVIS WITH CONTRAST TECHNIQUE: Multidetector CT imaging of the abdomen and pelvis was performed using the standard protocol following bolus administration of intravenous contrast. CONTRAST:  100 cc ISOVUE-300 IOPAMIDOL (ISOVUE-300) INJECTION 61% COMPARISON:  CT abdomen pelvis dated August 02, 2017. FINDINGS: Lower chest: No acute abnormality.  Hepatobiliary: No focal liver abnormality. The gallbladder is distended without wall thickening, gallstones, or biliary dilatation. Pancreas: Unremarkable. No pancreatic ductal dilatation or surrounding inflammatory changes. Spleen: Normal in size without focal abnormality. Adrenals/Urinary Tract: The adrenal glands are unremarkable. Delayed enhancement and excretion of contrast from the bilateral kidneys. Mild bilateral hydronephrosis. No focal renal lesion. No renal or ureteral calculi. Mild anterior and rightward displacement of the bladder, which is otherwise unremarkable in appearance. Stomach/Bowel: Stomach is within normal limits. Appendix is not definitely visualized, however there are no secondary inflammatory changes at the base of the cecum. No evidence of bowel wall thickening, distention, or inflammatory changes. Vascular/Lymphatic: Hypodense filling defect within the IVC. Multiple enlarged left external iliac lymph nodes measuring up to 13 mm in short axis. Multiple mildly enlarged retroperitoneal lymph nodes measuring up to 12 mm in short axis. Reproductive: Interval increase in size of the uterus, now measuring up to 11.2 x 17.5 x 18.4 cm, previously 11.0 x 13.2 x 15.5 cm, with interval increase in size of the large posterior intramural fibroid. Interval increase in size of the multiloculated, fluid and air-filled left adnexal mass, measuring 13.8 x 8.4 x 11.1 cm. There are surrounding inflammatory changes. The right adnexa is unremarkable. Other: Small amount of free fluid in the pelvis. No pneumoperitoneum. Small fat containing umbilical hernia. Musculoskeletal: No acute or significant osseous findings. IMPRESSION: 1. Large multiloculated, fluid and air-filled left adnexal mass, measuring 13.8 x 8.4 x 11.1 cm, with surrounding inflammatory changes, concerning for tubo-ovarian abscess. 2. Interval increase in size of the uterus due to enlargement of a large posterior intramural fibroid. 3. New mild  bilateral hydronephrosis and delayed renal function, likely secondary to bilateral distal ureteral obstruction due to pelvic pathology. 4. Hypodense filling defect within the IVC, concerning for thrombus. 5. Mild retroperitoneal and left external iliac lymphadenopathy is nonspecific, but may be reactive. These results were called by telephone at the time of interpretation on 11/15/2017 at 6:34 pm to Dr. Carmon Sails , who verbally acknowledged these results. Electronically Signed   By: Titus Dubin M.D.   On: 11/15/2017 18:36   Dg Abd 2 Views  Result Date: 11/15/2017 CLINICAL DATA:  Abdominal pain.  Fever.  Fibroids. EXAM: ABDOMEN - 2 VIEW COMPARISON:  None. FINDINGS: The bowel gas pattern is normal. There is no evidence of free air on the decubitus view. No radio-opaque calculi or other significant radiographic abnormality is seen. IMPRESSION: Negative. Electronically Signed   By: Staci Righter M.D.   On: 11/15/2017 16:41    EKG: Independently reviewed.  Sinus tachycardia with rate 121; nonspecific ST changes with no evidence of acute ischemia   Labs on Admission: I have personally reviewed the available labs and imaging studies at the time of the admission.  Pertinent labs:   Flu negative UA: large Hgb, small LE, 30 protein, TNTC RBC and WBC Urine culture pending Blood cultures pending Lactate 1.42 Glucose 123 Albumin 2.2 WBC 24.8; 21.1 earlier today; 24.3 on 3/19; 8.8 on 3/1 Hgb 6.1; 6.4 earlier today; 7.1 on 3/19; 5.7 on 3/1 Iron 16, Ferritin 273   Assessment/Plan Principal Problem:   IVC thrombosis (HCC) Active Problems:   DUB (dysfunctional uterine bleeding)   Symptomatic anemia   TOA (tubo-ovarian abscess)   IVC thrombosis -Patient with h/o antiphospholipid syndrome and recurrent DVTs -She has marked pelvic engorgement due to uterine fibroids and so 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 Donzetta Matters -He recommends an IVC duplex, if patient is able to tolerate -She may need IVC filter placement  Sepsis due to [facility] -Patient has an extremely large TOA, 13.8 x 8.4 x 11.1 cm, in the left adnexa -Elevated WBC count, fever, tachycardia  -While awaiting blood cultures, this appears to be a preseptic condition. -Sepsis protocol initiated -Blood and urine cultures pending -Will admit to SDU and continue to monitor -Treat with IV Vanc and Zosyn for now; since this is likely an intraabdominal/pelvic infection, it may be reasonable to discontinue Vanc in the next 12-24 hours -The TOA is likely to require drainage since the antibiotic penetration is unlikely sufficient -GYN consult in the AM -May also need IR consultation  Symptomatic anemia -Patient previously transfused just 2 days ago and given Feraheme -Now with some symptoms of anemia and Hgb again 6.1 -She does have ongoing vaginal bleeding even on Megace -Transfuse 2 units PRBC to start and recheck Hgb afterwards.    DUB from fibroids -CT today indicates enlargement in size of posterior intramural fibroid -The patient previously failed endometrial ablation -She is scheduled for embolization next week -Until her other acute problems are addressed, it is unlikely that this issue will be able to be addressed -However, until this issue is addressed, she is likely to continue to have recurrent anemia requiring transfusion -Will need GYN input regarding whether embolization sooner is reasonable and/or whether TAH should be considered  DVT prophylaxis: Heparin drip Code Status:  Full Family Communication: Spoke with husband by telephone Disposition Plan:  Home once clinically improved Consults called: GYN, Vascular  surgery; may need IR  Admission status: Admit - It is my clinical opinion that admission to [place] is reasonable and necessary because of the expectation that this patient will require hospital care that crosses at least 2 midnights to treat this condition based on the medical complexity of the problems presented.  Given the aforementioned information, the predictability of an adverse  outcome is felt to be significant.    Karmen Bongo MD [facility]  If note is complete, please contact covering daytime or nighttime physician. [email or web address] Password [facility]  11/16/2017, 12:42 AM

## 2017-11-15 NOTE — Progress Notes (Addendum)
Pharmacy Antibiotic and Anticoagulation Note  [name] is a 42 y.o. female admitted on 11/15/2017 with sepsis.  Pharmacy has been consulted for vancomycin dosing. IV heparin for possible IVC thrombosis.   Plan: Vancomycin 2 Gm x1 then 1500 mg IV q24h for est AUC =427 Goal AUC =400-500 ZEI (MD) Daily scr F/u scr/cultures/levels  Baseline aPtt/PT/INR/HL STAT (pending) Heparin 2000 units bolus x1 now Heparin drip at 1200 units/hr Will use aptt until xarelto effects on HL diminish Goal HL=0.3-0.7 Goal aPtt=66-102 sec Daily CBC/HL Check 1st HL in 6 hours     Temp (24hrs), Avg:101.4 F (38.6 C), Min:99.7 F (37.6 C), Max:102.5 F (39.2 C)  Recent Labs  Lab 11/13/17 1457 11/15/17 1110 11/15/17 1505 11/15/17 1522  WBC 24.3* 21.1* 24.8*  --   CREATININE  --   --  0.99  --   LATICACIDVEN  --   --   --  1.42    Estimated Creatinine Clearance: 86.5 mL/min (by C-G formula based on SCr of 0.99 mg/dL).    No Known Allergies  Antimicrobials this admission: 3/21 vancomycin >>   3/21 zosyn >>   Dose adjustments this admission:   Microbiology results:  BCx:   UCx:    Sputum:    MRSA PCR:   Thank you for allowing pharmacy to be a part of this patient's care.  Dorrene German 11/15/2017 11:07 PM

## 2017-11-15 NOTE — Progress Notes (Signed)
Attempted to call to get report. Raquel Sarna in patient's room and will call back.

## 2017-11-15 NOTE — ED Notes (Signed)
ED TO INPATIENT HANDOFF REPORT  Name/Age/Gender [name] 42 y.o. female  Code Status Code Status History    Date Active Date Inactive Code Status Order ID Comments User Context   10/02/2017 1222 10/03/2017 2057 Full Code [id number]  Dionne Milo, NP ED   10/07/2014 1444 10/08/2014 2001 Full Code [id number]  Lavonia Drafts, MD Inpatient      Home/SNF/Other Home  Chief Complaint Cancer center  Level of Care/Admitting Diagnosis ED Disposition    ED Disposition Condition [place]: [place] [100102]  Level of Care: Stepdown [14]  Admit to SDU based on following criteria: Hemodynamic compromise or significant risk of instability:  Patient requiring short term acute titration and management of vasoactive drips, and invasive monitoring (i.e., CVP and Arterial line).  Diagnosis: Symptomatic anemia [[id number]]  Admitting Physician: Karmen Bongo [[id number]]  Attending Physician: Karmen Bongo [[id number]]  Estimated length of stay: 3 - 4 days  Certification:: I certify this patient will need inpatient services for at least 2 midnights  PT Class (Do Not Modify): Inpatient [101]  PT Acc Code (Do Not Modify): Private [1]       Medical History Past Medical History:  Diagnosis Date  . Anemia 09/2017   REQUIRING A TRANSFUSION  . Antiphospholipid syndrome ([place])   . Breast cyst    BILATERAL  . DVT (deep venous thrombosis) ([place])   . Dysfunctional uterine bleeding   . Fibroids   . Obesity     Allergies No Known Allergies  IV Location/Drains/Wounds Patient Lines/Drains/Airways Status   Active Line/Drains/Airways    Name:   Placement date:   Placement time:   Site:   Days:   Peripheral IV 11/15/17 Left Forearm   11/15/17    1155    Forearm   less than 1   Peripheral IV 11/15/17 Right Antecubital   11/15/17    1520    Antecubital   less than 1   Incision (Closed) 10/08/14 Perineum Other (Comment)   10/08/14    1028     1134          Labs/Imaging Results for orders placed or performed during the hospital encounter of 11/15/17 (from the past 48 hour(s))  Prepare RBC     Status: None   Collection Time: 11/15/17  3:01 PM  Result Value Ref Range   Order Confirmation      ORDER PROCESSED BY BLOOD BANK Performed at [facility] [address], [place], [place] [address]   Comprehensive metabolic panel     Status: Abnormal   Collection Time: 11/15/17  3:05 PM  Result Value Ref Range   Sodium 133 (L) 135 - 145 mmol/L   Potassium 3.4 (L) 3.5 - 5.1 mmol/L   Chloride 95 (L) 101 - 111 mmol/L   CO2 25 22 - 32 mmol/L   Glucose, Bld 123 (H) 65 - 99 mg/dL   BUN 13 6 - 20 mg/dL   Creatinine, Ser 0.99 0.44 - 1.00 mg/dL   Calcium 8.5 (L) 8.9 - 10.3 mg/dL   Total Protein 7.5 6.5 - 8.1 g/dL   Albumin 2.2 (L) 3.5 - 5.0 g/dL   AST 23 15 - 41 U/L   ALT 18 14 - 54 U/L   Alkaline Phosphatase 77 38 - 126 U/L   Total Bilirubin 0.6 0.3 - 1.2 mg/dL   GFR calc non Af Amer >60 >60 mL/min   GFR calc Af Amer >60 >60 mL/min  Comment: (NOTE) The eGFR has been calculated using the CKD EPI equation. This calculation has not been validated in all clinical situations. eGFR's persistently <60 mL/min signify possible Chronic Kidney Disease.    Anion gap 13 5 - 15    Comment: Performed at [facility], [place] [address] [place], [place] [address]  CBC WITH DIFFERENTIAL     Status: Abnormal   Collection Time: 11/15/17  3:05 PM  Result Value Ref Range   WBC 24.8 (H) 4.0 - 10.5 K/uL   RBC 2.48 (L) 3.87 - 5.11 MIL/uL   Hemoglobin 6.1 (LL) 12.0 - 15.0 g/dL    Comment: REPEATED TO VERIFY CRITICAL RESULT CALLED TO, READ BACK BY AND VERIFIED WITH: Erasmo Score [id number] @ 1555 BY J SCOTTON    HCT 20.6 (L) 36.0 - 46.0 %   MCV 83.1 78.0 - 100.0 fL   MCH 24.6 (L) 26.0 - 34.0 pg   MCHC 29.6 (L) 30.0 - 36.0 g/dL   RDW 19.5 (H) 11.5 - 15.5 %   Platelets 306 150 - 400 K/uL   Neutrophils Relative %  77 %   Lymphocytes Relative 9 %   Monocytes Relative 14 %   Eosinophils Relative 0 %   Basophils Relative 0 %   nRBC 1 (H) 0 /100 WBC   Neutro Abs 19.1 (H) 1.7 - 7.7 K/uL   Lymphs Abs 2.2 0.7 - 4.0 K/uL   Monocytes Absolute 3.5 (H) 0.1 - 1.0 K/uL   Eosinophils Absolute 0.0 0.0 - 0.7 K/uL   Basophils Absolute 0.0 0.0 - 0.1 K/uL   RBC Morphology POLYCHROMASIA PRESENT     Comment: TARGET CELLS   WBC Morphology TOXIC GRANULATION     Comment: DOHLE BODIES Performed at [facility], [place] [address], [place], [place] [address]   Lipase, blood     Status: None   Collection Time: 11/15/17  3:05 PM  Result Value Ref Range   Lipase 14 11 - 51 U/L    Comment: Performed at [facility], [place] [address] [place], [place] [address]  I-Stat Beta hCG blood, ED ([facility], [facility], [facility] only)     Status: Abnormal   Collection Time: 11/15/17  3:14 PM  Result Value Ref Range   I-stat hCG, quantitative 11.7 (H) <5 mIU/mL   Comment 3            Comment:   GEST. AGE      CONC.  (mIU/mL)   <=1 WEEK        5 - 50     2 WEEKS       50 - 500     3 WEEKS       100 - 10,000     4 WEEKS     1,000 - 30,000        FEMALE AND NON-PREGNANT FEMALE:     LESS THAN 5 mIU/mL   I-Stat CG4 Lactic Acid, ED  (not at  [facility])     Status: None   Collection Time: 11/15/17  3:22 PM  Result Value Ref Range   Lactic Acid, Venous 1.42 0.5 - 1.9 mmol/L  Influenza panel by PCR (type A & B)     Status: None   Collection Time: 11/15/17  3:47 PM  Result Value Ref Range   Influenza A By PCR NEGATIVE NEGATIVE   Influenza B By PCR NEGATIVE NEGATIVE    Comment: (NOTE) The Xpert Xpress Flu assay is intended as an  aid in the diagnosis of  influenza and should not be used as a sole basis for treatment.  This  assay is FDA approved for nasopharyngeal swab specimens only. Nasal  washings and aspirates are unacceptable for Xpert Xpress Flu testing. Performed at [facility], [place] [address], [place], [place] [address]   Urinalysis, Routine w reflex microscopic     Status: Abnormal   Collection Time: 11/15/17  5:38 PM  Result Value Ref Range   Color, Urine YELLOW YELLOW   APPearance HAZY (A) CLEAR   Specific Gravity, Urine 1.015 1.005 - 1.030   pH 6.0 5.0 - 8.0   Glucose, UA NEGATIVE NEGATIVE mg/dL   Hgb urine dipstick LARGE (A) NEGATIVE   Bilirubin Urine NEGATIVE NEGATIVE   Ketones, ur NEGATIVE NEGATIVE mg/dL   Protein, ur 30 (A) NEGATIVE mg/dL   Nitrite NEGATIVE NEGATIVE   Leukocytes, UA SMALL (A) NEGATIVE   RBC / HPF TOO NUMEROUS TO COUNT 0 - 5 RBC/hpf   WBC, UA TOO NUMEROUS TO COUNT 0 - 5 WBC/hpf   Bacteria, UA NONE SEEN NONE SEEN   Squamous Epithelial / LPF 0-5 (A) NONE SEEN    Comment: Performed at [facility], [place] [address], [place], [place] [address]   Dg Chest 2 View  Result Date: 11/15/2017 CLINICAL DATA:  Low-grade fever, tachycardia, leukocytosis EXAM: CHEST - 2 VIEW COMPARISON:  CT chest 04/08/2008 FINDINGS: Normal heart size, mediastinal contours, and pulmonary vascularity. Lungs clear. No pulmonary infiltrate, pleural effusion or pneumothorax. Bones unremarkable. IMPRESSION: No acute abnormalities. Electronically Signed   By: Lavonia Dana M.D.   On: 11/15/2017 16:43   Ct Abdomen Pelvis W Contrast  Result Date: 11/15/2017 CLINICAL DATA:  Fever, tachycardia, and leukocytosis. History of uterine fibroids. EXAM: CT ABDOMEN AND PELVIS WITH CONTRAST TECHNIQUE: Multidetector CT imaging of the abdomen and pelvis was performed using the standard protocol following bolus administration of intravenous contrast. CONTRAST:  100 cc ISOVUE-300 IOPAMIDOL (ISOVUE-300) INJECTION 61% COMPARISON:  CT abdomen pelvis dated August 02, 2017. FINDINGS: Lower chest: No acute abnormality. Hepatobiliary: No focal liver abnormality. The gallbladder is distended without wall thickening, gallstones, or biliary dilatation. Pancreas: Unremarkable. No pancreatic ductal  dilatation or surrounding inflammatory changes. Spleen: Normal in size without focal abnormality. Adrenals/Urinary Tract: The adrenal glands are unremarkable. Delayed enhancement and excretion of contrast from the bilateral kidneys. Mild bilateral hydronephrosis. No focal renal lesion. No renal or ureteral calculi. Mild anterior and rightward displacement of the bladder, which is otherwise unremarkable in appearance. Stomach/Bowel: Stomach is within normal limits. Appendix is not definitely visualized, however there are no secondary inflammatory changes at the base of the cecum. No evidence of bowel wall thickening, distention, or inflammatory changes. Vascular/Lymphatic: Hypodense filling defect within the IVC. Multiple enlarged left external iliac lymph nodes measuring up to 13 mm in short axis. Multiple mildly enlarged retroperitoneal lymph nodes measuring up to 12 mm in short axis. Reproductive: Interval increase in size of the uterus, now measuring up to 11.2 x 17.5 x 18.4 cm, previously 11.0 x 13.2 x 15.5 cm, with interval increase in size of the large posterior intramural fibroid. Interval increase in size of the multiloculated, fluid and air-filled left adnexal mass, measuring 13.8 x 8.4 x 11.1 cm. There are surrounding inflammatory changes. The right adnexa is unremarkable. Other: Small amount of free fluid in the pelvis. No pneumoperitoneum. Small fat containing umbilical hernia. Musculoskeletal: No acute or significant osseous findings. IMPRESSION: 1. Large multiloculated, fluid and air-filled  left adnexal mass, measuring 13.8 x 8.4 x 11.1 cm, with surrounding inflammatory changes, concerning for tubo-ovarian abscess. 2. Interval increase in size of the uterus due to enlargement of a large posterior intramural fibroid. 3. New mild bilateral hydronephrosis and delayed renal function, likely secondary to bilateral distal ureteral obstruction due to pelvic pathology. 4. Hypodense filling defect within the  IVC, concerning for thrombus. 5. Mild retroperitoneal and left external iliac lymphadenopathy is nonspecific, but may be reactive. These results were called by telephone at the time of interpretation on 11/15/2017 at 6:34 pm to Dr. Carmon Sails , who verbally acknowledged these results. Electronically Signed   By: Titus Dubin M.D.   On: 11/15/2017 18:36   Dg Abd 2 Views  Result Date: 11/15/2017 CLINICAL DATA:  Abdominal pain.  Fever.  Fibroids. EXAM: ABDOMEN - 2 VIEW COMPARISON:  None. FINDINGS: The bowel gas pattern is normal. There is no evidence of free air on the decubitus view. No radio-opaque calculi or other significant radiographic abnormality is seen. IMPRESSION: Negative. Electronically Signed   By: Staci Righter M.D.   On: 11/15/2017 16:41    Pending Labs Unresulted Labs (From admission, onward)   Start     Ordered   11/15/17 1450  Blood Culture (routine x 2)  BLOOD CULTURE X 2,   STAT     11/15/17 1450   11/15/17 1450  Urine culture  STAT,   STAT     11/15/17 1450      Vitals/Pain Today's Vitals   11/15/17 2200 11/15/17 2210 11/15/17 2220 11/15/17 2227  BP: 131/87     Pulse: (!) 134 (!) 131 (!) 131   Resp: (!) 22 20 (!) 24   Temp:    (!) 102.1 F (38.9 C)  TempSrc:    Oral  SpO2: 98% 100% 100%   PainSc:        Isolation Precautions Droplet precaution  Medications Medications  sodium chloride 0.9 % injection (has no administration in time range)  piperacillin-tazobactam (ZOSYN) IVPB 3.375 g (0 g Intravenous Stopped 11/15/17 1553)  sodium chloride 0.9 % bolus 1,000 mL (0 mLs Intravenous Stopped 11/15/17 1648)    And  sodium chloride 0.9 % bolus 1,000 mL (0 mLs Intravenous Stopped 11/15/17 1648)    And  sodium chloride 0.9 % bolus 250 mL (0 mLs Intravenous Stopped 11/15/17 1902)  vancomycin (VANCOCIN) 2,000 mg in sodium chloride 0.9 % 500 mL IVPB (0 mg Intravenous Stopped 11/15/17 1808)  0.9 %  sodium chloride infusion (10 mL/hr Intravenous New Bag/Given 11/15/17  1855)  fentaNYL (SUBLIMAZE) injection 50 mcg (50 mcg Intravenous Given 11/15/17 1544)  iopamidol (ISOVUE-300) 61 % injection (100 mLs Intravenous Contrast Given 11/15/17 1750)  acetaminophen (TYLENOL) tablet 650 mg (650 mg Oral Given 11/15/17 2033)    Mobility walks

## 2017-11-15 NOTE — ED Provider Notes (Signed)
[facility] DEPT Provider Note   CSN: [id number] Arrival date & time: 11/15/17  1414     History   Chief Complaint Chief Complaint  Patient presents with  . Abdominal Pain  . Abnormal Lab    HPI [name] is a 42 y.o. female with history of abnormal uterine bleeding due to fibroids, anemia, DVT on xarelto is sent from the cancer center for evaluation of fever. Patient was to receive 2 units of blood for a low hemoglobin at the cancer center, was noted to be febrile and tachycardic with worsening abdominal distention and pain. She was unable to receive her blood transfusion and was sent to the ED. In regards to her fibroids, patient had a uterine ablation however continues to bleed, she is awaiting an embolization to be done next week. She has had continued vaginal bleeding that has not changed or increased recently. Has chronic abdominal soreness but noticed that over the weekend she has noticed pain has increased and her abdomen feels more swollen. He has had intermittent fevers, chills, sweats and cough over the weekend. GYN Dr. Harolyn Rutherford.   HPI  Past Medical History:  Diagnosis Date  . Anemia 09/2017   REQUIRING A TRANSFUSION  . Antiphospholipid syndrome ([place])   . Breast cyst    BILATERAL  . DVT (deep venous thrombosis) ([place])   . Dysfunctional uterine bleeding   . Fibroids   . Obesity     Patient Active Problem List   Diagnosis Date Noted  . Symptomatic anemia 10/02/2017  . Anemia 03/09/2017  . Preventative health care 03/08/2017  . Acute blood loss anemia 10/06/2014  . Recurrent deep vein thrombosis (DVT) ([place]) 09/30/2014  . Infertility, female 07/08/2012  . Fibroids 06/17/2012  . DUB (dysfunctional uterine bleeding) 06/14/2012  . DEEP VENOUS THROMBOPHLEBITIS, HX OF 04/10/2007    Past Surgical History:  Procedure Laterality Date  . CESAREAN SECTION    . DILITATION & CURRETTAGE/HYSTROSCOPY WITH HYDROTHERMAL ABLATION N/A  10/08/2014   Procedure: DILATATION & CURETTAGE/HYSTEROSCOPY WITH HYDROTHERMAL ABLATION;  Surgeon: Osborne Oman, MD;  Location: [facility];  Service: Gynecology;  Laterality: N/A;  . IR RADIOLOGIST EVAL & MGMT  10/10/2017  . MYOMECTOMY      OB History    Gravida  1   Para  1   Term  0   Preterm  0   AB  0   Living  1     SAB  0   TAB  0   Ectopic  0   Multiple  0   Live Births  1            Home Medications    Prior to Admission medications   Medication Sig Start Date End Date Taking? Authorizing Provider  acetaminophen (TYLENOL) 500 MG tablet Take 1,000 mg by mouth every 6 (six) hours as needed for fever.   Yes [provider]  Ferrous Sulfate (IRON) 325 (65 Fe) MG TABS Take 1 tablet (325 mg total) by mouth daily. 10/22/17  Yes Venia Carbon, MD  megestrol (MEGACE) 40 MG tablet Take 2 tablets (80 mg total) by mouth 2 (two) times daily. 10/08/17  Yes Anyanwu, Sallyanne Havers, MD  Multiple Vitamin (MULTIVITAMIN WITH MINERALS) TABS tablet Take 1 tablet by mouth daily.   Yes [provider]  traMADol (ULTRAM) 50 MG tablet Take 1 tablet (50 mg total) by mouth 3 (three) times daily as needed. Patient taking differently: Take 50 mg by mouth 3 (  three) times daily as needed for moderate pain.  11/12/17  Yes Venia Carbon, MD  XARELTO 20 MG TABS tablet TAKE 1 TABLET (20 MG TOTAL) BY MOUTH DAILY WITH SUPPER. 09/13/17  Yes Venia Carbon, MD    Family History Family History  Problem Relation Age of Onset  . Diabetes Paternal Grandmother   . Heart disease Neg Hx   . Hypertension Neg Hx   . Cancer Neg Hx        breast or colon cancer    Social History Social History   Tobacco Use  . Smoking status: Never Smoker  . Smokeless tobacco: Never Used  Substance Use Topics  . Alcohol use: Yes    Comment: rare  . Drug use: No     Allergies   Patient has no known allergies.   Review of Systems Review of Systems  Constitutional: Positive for  chills, diaphoresis and fever.  Respiratory: Positive for cough and shortness of breath.   Gastrointestinal: Positive for abdominal pain.  Genitourinary: Positive for menstrual problem, pelvic pain and vaginal bleeding.  Musculoskeletal: Positive for myalgias.  Hematological: Bruises/bleeds easily.  All other systems reviewed and are negative.    Physical Exam Updated Vital Signs BP 134/73 (BP Location: Left Arm)   Pulse (!) 131   Temp (!) 101.2 F (38.4 C) (Oral)   Resp (!) 27   SpO2 99%   Physical Exam  Constitutional: She is oriented to person, place, and time. She appears well-developed and well-nourished. No distress.  Skin is clammy and warm.   HENT:  Head: Normocephalic and atraumatic.  Nose: Nose normal.  Mouth/Throat: No oropharyngeal exudate.  MMM  Eyes: Pupils are equal, round, and reactive to light. Conjunctivae and EOM are normal.  Neck: Normal range of motion.  Cardiovascular: Regular rhythm, S1 normal, S2 normal and intact distal pulses. Tachycardia present.  No murmur heard. 2+ DP and radial pulses bilaterally. No LE edema.   Pulmonary/Chest: Effort normal. No respiratory distress. She has decreased breath sounds in the right lower field and the left lower field. She has no wheezes. She has no rhonchi. She has no rales.  Abdominal: Soft. Bowel sounds are normal. There is tenderness in the periumbilical area and suprapubic area. There is rigidity.  Generalized abdominal tenderness worst around umbilicus and below. Distention and rigidity noted. Negative Murphy's and McBurney's. No CVAT.  Musculoskeletal: Normal range of motion. She exhibits no deformity.  Neurological: She is alert and oriented to person, place, and time.  Skin: Skin is warm. Capillary refill takes less than 2 seconds. She is diaphoretic.  Psychiatric: She has a normal mood and affect. Her behavior is normal. Judgment and thought content normal.  Nursing note and vitals reviewed.    ED  Treatments / Results  Labs (all labs ordered are listed, but only abnormal results are displayed) Labs Reviewed  COMPREHENSIVE METABOLIC PANEL - Abnormal; Notable for the following components:      Result Value   Sodium 133 (*)    Potassium 3.4 (*)    Chloride 95 (*)    Glucose, Bld 123 (*)    Calcium 8.5 (*)    Albumin 2.2 (*)    All other components within normal limits  CBC WITH DIFFERENTIAL/PLATELET - Abnormal; Notable for the following components:   WBC 24.8 (*)    RBC 2.48 (*)    Hemoglobin 6.1 (*)    HCT 20.6 (*)    MCH 24.6 (*)    MCHC 29.6 (*)  RDW 19.5 (*)    nRBC 1 (*)    Neutro Abs 19.1 (*)    Monocytes Absolute 3.5 (*)    All other components within normal limits  URINALYSIS, ROUTINE W REFLEX MICROSCOPIC - Abnormal; Notable for the following components:   APPearance HAZY (*)    Hgb urine dipstick LARGE (*)    Protein, ur 30 (*)    Leukocytes, UA SMALL (*)    Squamous Epithelial / LPF 0-5 (*)    All other components within normal limits  I-STAT BETA HCG BLOOD, ED ([facility], [facility], AP ONLY) - Abnormal; Notable for the following components:   I-stat hCG, quantitative 11.7 (*)    All other components within normal limits  CULTURE, BLOOD (ROUTINE X 2)  CULTURE, BLOOD (ROUTINE X 2)  URINE CULTURE  LIPASE, BLOOD  INFLUENZA PANEL BY PCR (TYPE A & B)  I-STAT CG4 LACTIC ACID, ED  PREPARE RBC (CROSSMATCH)    EKG  EKG Interpretation None       Radiology Dg Chest 2 View  Result Date: 11/15/2017 CLINICAL DATA:  Low-grade fever, tachycardia, leukocytosis EXAM: CHEST - 2 VIEW COMPARISON:  CT chest 04/08/2008 FINDINGS: Normal heart size, mediastinal contours, and pulmonary vascularity. Lungs clear. No pulmonary infiltrate, pleural effusion or pneumothorax. Bones unremarkable. IMPRESSION: No acute abnormalities. Electronically Signed   By: Lavonia Dana M.D.   On: 11/15/2017 16:43   Ct Abdomen Pelvis W Contrast  Result Date: 11/15/2017 CLINICAL DATA:  Fever, tachycardia,  and leukocytosis. History of uterine fibroids. EXAM: CT ABDOMEN AND PELVIS WITH CONTRAST TECHNIQUE: Multidetector CT imaging of the abdomen and pelvis was performed using the standard protocol following bolus administration of intravenous contrast. CONTRAST:  100 cc ISOVUE-300 IOPAMIDOL (ISOVUE-300) INJECTION 61% COMPARISON:  CT abdomen pelvis dated August 02, 2017. FINDINGS: Lower chest: No acute abnormality. Hepatobiliary: No focal liver abnormality. The gallbladder is distended without wall thickening, gallstones, or biliary dilatation. Pancreas: Unremarkable. No pancreatic ductal dilatation or surrounding inflammatory changes. Spleen: Normal in size without focal abnormality. Adrenals/Urinary Tract: The adrenal glands are unremarkable. Delayed enhancement and excretion of contrast from the bilateral kidneys. Mild bilateral hydronephrosis. No focal renal lesion. No renal or ureteral calculi. Mild anterior and rightward displacement of the bladder, which is otherwise unremarkable in appearance. Stomach/Bowel: Stomach is within normal limits. Appendix is not definitely visualized, however there are no secondary inflammatory changes at the base of the cecum. No evidence of bowel wall thickening, distention, or inflammatory changes. Vascular/Lymphatic: Hypodense filling defect within the IVC. Multiple enlarged left external iliac lymph nodes measuring up to 13 mm in short axis. Multiple mildly enlarged retroperitoneal lymph nodes measuring up to 12 mm in short axis. Reproductive: Interval increase in size of the uterus, now measuring up to 11.2 x 17.5 x 18.4 cm, previously 11.0 x 13.2 x 15.5 cm, with interval increase in size of the large posterior intramural fibroid. Interval increase in size of the multiloculated, fluid and air-filled left adnexal mass, measuring 13.8 x 8.4 x 11.1 cm. There are surrounding inflammatory changes. The right adnexa is unremarkable. Other: Small amount of free fluid in the pelvis. No  pneumoperitoneum. Small fat containing umbilical hernia. Musculoskeletal: No acute or significant osseous findings. IMPRESSION: 1. Large multiloculated, fluid and air-filled left adnexal mass, measuring 13.8 x 8.4 x 11.1 cm, with surrounding inflammatory changes, concerning for tubo-ovarian abscess. 2. Interval increase in size of the uterus due to enlargement of a large posterior intramural fibroid. 3. New mild bilateral hydronephrosis and delayed renal function,  likely secondary to bilateral distal ureteral obstruction due to pelvic pathology. 4. Hypodense filling defect within the IVC, concerning for thrombus. 5. Mild retroperitoneal and left external iliac lymphadenopathy is nonspecific, but may be reactive. These results were called by telephone at the time of interpretation on 11/15/2017 at 6:34 pm to Dr. Carmon Sails , who verbally acknowledged these results. Electronically Signed   By: Titus Dubin M.D.   On: 11/15/2017 18:36   Dg Abd 2 Views  Result Date: 11/15/2017 CLINICAL DATA:  Abdominal pain.  Fever.  Fibroids. EXAM: ABDOMEN - 2 VIEW COMPARISON:  None. FINDINGS: The bowel gas pattern is normal. There is no evidence of free air on the decubitus view. No radio-opaque calculi or other significant radiographic abnormality is seen. IMPRESSION: Negative. Electronically Signed   By: Staci Righter M.D.   On: 11/15/2017 16:41    CRITICAL CARE Performed by: Kinnie Feil   Total critical care time: 30 minutes  Critical care time was exclusive of separately billable procedures and treating other patients.  Critical care was necessary to treat or prevent imminent or life-threatening deterioration.  Critical care was time spent personally by me on the following activities: development of treatment plan with patient and/or surrogate as well as nursing, discussions with consultants, evaluation of patient's response to treatment, examination of patient, obtaining history from patient or  surrogate, ordering and performing treatments and interventions, ordering and review of laboratory studies, ordering and review of radiographic studies, pulse oximetry and re-evaluation of patient's condition.   Procedures Procedures (including critical care time)  Medications Ordered in ED Medications  sodium chloride 0.9 % injection (has no administration in time range)  piperacillin-tazobactam (ZOSYN) IVPB 3.375 g (0 g Intravenous Stopped 11/15/17 1553)  sodium chloride 0.9 % bolus 1,000 mL (0 mLs Intravenous Stopped 11/15/17 1648)    And  sodium chloride 0.9 % bolus 1,000 mL (0 mLs Intravenous Stopped 11/15/17 1648)    And  sodium chloride 0.9 % bolus 250 mL (0 mLs Intravenous Stopped 11/15/17 1902)  vancomycin (VANCOCIN) 2,000 mg in sodium chloride 0.9 % 500 mL IVPB (0 mg Intravenous Stopped 11/15/17 1808)  0.9 %  sodium chloride infusion (10 mL/hr Intravenous New Bag/Given 11/15/17 1855)  fentaNYL (SUBLIMAZE) injection 50 mcg (50 mcg Intravenous Given 11/15/17 1544)  iopamidol (ISOVUE-300) 61 % injection (100 mLs Intravenous Contrast Given 11/15/17 1750)  acetaminophen (TYLENOL) tablet 650 mg (650 mg Oral Given 11/15/17 2033)     Initial Impression / Assessment and Plan / ED Course  I have reviewed the triage vital signs and the nursing notes.  Pertinent labs & imaging results that were available during my care of the patient were reviewed by me and considered in my medical decision making (see chart for details).  Clinical Course as of Nov 15 2117  Thu Nov 15, 2017  1709 Temp(!): 101.8 F (38.8 C) [CG]  1709 Pulse Rate(!): 135 [CG]  1709 WBC(!): 24.8 [CG]  1709 Lactic Acid, Venous: 1.42 [CG]  1709 Hemoglobin(!!): 6.1 [CG]  1709 HCT(!): 20.6 [CG]  1709 WBC Morphology: TOXIC GRANULATION [CG]  1710 FINDINGS: Normal heart size, mediastinal contours, and pulmonary vascularity.  Lungs clear.  No pulmonary infiltrate, pleural effusion or pneumothorax.  DG Chest 2 View [CG]  1710  FINDINGS: The bowel gas pattern is normal. There is no evidence of free air on the decubitus view. No radio-opaque calculi or other significant radiographic abnormality is seen.  DG Abd 2 Views [CG]  1831 Left large TOA originating  from left adnexa causing mass effect on ureters causing bilateral hydro. Also noted filling defect on IVC likely thrombus per radiology    [CG]  1841  IMPRESSION: 1. Large multiloculated, fluid and air-filled left adnexal mass, measuring 13.8 x 8.4 x 11.1 cm, with surrounding inflammatory changes, concerning for tubo-ovarian abscess. 2. Interval increase in size of the uterus due to enlargement of a large posterior intramural fibroid. 3. New mild bilateral hydronephrosis and delayed renal function, likely secondary to bilateral distal ureteral obstruction due to pelvic pathology. 4. Hypodense filling defect within the IVC, concerning for thrombus. 5. Mild retroperitoneal and left external iliac lymphadenopathy is nonspecific, but may be reactive.  CT ABDOMEN PELVIS W CONTRAST [CG]  1905 Dr Rosana Hoes [facility]) recommending transfer to [facility]   [CG]    Clinical Course User Index [CG] Kinnie Feil, PA-C    4211: 42 year old with uterine fibroids sent from [facility] for fever, she was unable to receive 2 units of blood for symptomatic anemia. She has symptoms of upper respiratory infection. On exam she meets SIRS criteria with fever, tachycardia. She has mostly lower abdominal tenderness, distention and rigidity. Ongoing vaginal bleeding on megace and xarelto. Code sepsis activated. IVF and abx ordered. We'll transfuse 2 units as planned by cancer Center. Pending labs, EKG, CXR.   8937: CT shows large TOA, enlarging fibroids and possible thrombus in IVC. Spoke to Dr Rosana Hoes [facility]) recommends transfer to [facility] for drainage of abscess, in agreement with broad spectrum abx.   2115: Spoke to Dr. Rosana Hoes GYN again, has reviewed pt's imaging and recommending  admission to [facility] instead for further evaluation of thrombus. Will consult vascular surgery and request admission. Spoke to Dr. Lorin Mercy who will admit patient. Dr. Donzetta Matters (vascular) has been consulted and will see patient. Patient's fever returned, APAP given doubt this is from transfusion reaction likely from large abscess that will need urgent drainage.  Final Clinical Impressions(s) / ED Diagnoses   Final diagnoses:  Tubal ovarian abscess    ED Discharge Orders    None       Arlean Hopping 11/15/17 2119    Duffy Bruce, MD 11/16/17 [phone]

## 2017-11-15 NOTE — ED Notes (Signed)
Bed: WA06 Expected date:  Expected time:  Means of arrival:  Comments: Hold for cancer center vaginal bleeding

## 2017-11-15 NOTE — Progress Notes (Signed)
Faculty Note  Received consult from Carmon Sails, [place] at [facility] for eval of patient known to [facility], currently under management for large fibroid uterus, failed endometrial ablation and scheduled to undergo uterine artery embolization next week who presented to ED for abdominal pain, fevers, chills. Per notes, has been having ongoing vaginal bleeding that has not increased, has been on Megace. Also with anemia to 6.1/20.6. H/o recurrent DVT on Xarelto.  On CT, she was noted to have large adnexal mass concerning for tubo-ovarian abscess as well as possible thrombus noted in IVC.  Recommend admission under hospitalist service for possible IVC thrombus and medical management, zosyn for TOA. GYN will consult and consider possible drainage by IR.  Thank you for this consult.  For OB/GYN issues, please call the [facility] [place] Monday - Friday, 8 am - 5 pm: [phone] All other times: [phone]  K. Arvilla Meres, M.D. Attending [place], [facility] for [employer], [place]

## 2017-11-15 NOTE — ED Notes (Signed)
One attempt made to call report. Was told RN would call back.

## 2017-11-16 DIAGNOSIS — N7093 Salpingitis and oophoritis, unspecified: Secondary | ICD-10-CM

## 2017-11-16 DIAGNOSIS — N938 Other specified abnormal uterine and vaginal bleeding: Secondary | ICD-10-CM

## 2017-11-16 DIAGNOSIS — I82220 Acute embolism and thrombosis of inferior vena cava: Secondary | ICD-10-CM | POA: Diagnosis present

## 2017-11-16 DIAGNOSIS — I82409 Acute embolism and thrombosis of unspecified deep veins of unspecified lower extremity: Secondary | ICD-10-CM

## 2017-11-16 DIAGNOSIS — D649 Anemia, unspecified: Secondary | ICD-10-CM

## 2017-11-16 LAB — PREPARE RBC (CROSSMATCH)

## 2017-11-16 LAB — BASIC METABOLIC PANEL
Anion gap: 8 (ref 5–15)
BUN: 12 mg/dL (ref 6–20)
CHLORIDE: 104 mmol/L (ref 101–111)
CO2: 25 mmol/L (ref 22–32)
Calcium: 8 mg/dL — ABNORMAL LOW (ref 8.9–10.3)
Creatinine, Ser: 1.08 mg/dL — ABNORMAL HIGH (ref 0.44–1.00)
GFR calc Af Amer: >60 mL/min (ref >60)
GFR calc non Af Amer: >60 mL/min (ref >60)
GLUCOSE: 122 mg/dL — AB (ref 65–99)
POTASSIUM: 3.7 mmol/L (ref 3.5–5.1)
Sodium: 137 mmol/L (ref 135–145)

## 2017-11-16 LAB — CBC
HEMATOCRIT: 27.3 % — AB (ref 36.0–46.0)
HEMOGLOBIN: 8.4 g/dL — AB (ref 12.0–15.0)
MCH: 25.5 pg — AB (ref 26.0–34.0)
MCHC: 30.8 g/dL (ref 30.0–36.0)
MCV: 83 fL (ref 78.0–100.0)
Platelets: 312 10*3/uL (ref 150–400)
RBC: 3.29 MIL/uL — ABNORMAL LOW (ref 3.87–5.11)
RDW: 17.7 % — ABNORMAL HIGH (ref 11.5–15.5)
WBC: 24.1 10*3/uL — ABNORMAL HIGH (ref 4.0–10.5)

## 2017-11-16 LAB — CBC WITH DIFFERENTIAL/PLATELET
BASOS PCT: 0 %
Basophils Absolute: 0 10*3/uL (ref 0.0–0.1)
EOS ABS: 0.2 10*3/uL (ref 0.0–0.7)
EOS PCT: 1 %
HCT: 23.5 % — ABNORMAL LOW (ref 36.0–46.0)
Hemoglobin: 7.3 g/dL — ABNORMAL LOW (ref 12.0–15.0)
LYMPHS PCT: 10 %
Lymphs Abs: 2.1 10*3/uL (ref 0.7–4.0)
MCH: 25.8 pg — ABNORMAL LOW (ref 26.0–34.0)
MCHC: 31.1 g/dL (ref 30.0–36.0)
MCV: 83 fL (ref 78.0–100.0)
Monocytes Absolute: 1.2 10*3/uL — ABNORMAL HIGH (ref 0.1–1.0)
Monocytes Relative: 6 %
NEUTROS PCT: 83 %
Neutro Abs: 17.3 10*3/uL — ABNORMAL HIGH (ref 1.7–7.7)
PLATELETS: 264 10*3/uL (ref 150–400)
RBC: 2.83 MIL/uL — ABNORMAL LOW (ref 3.87–5.11)
RDW: 18.1 % — ABNORMAL HIGH (ref 11.5–15.5)
WBC: 20.8 10*3/uL — ABNORMAL HIGH (ref 4.0–10.5)

## 2017-11-16 LAB — PROTIME-INR
INR: 1.78
Prothrombin Time: 20.5 seconds — ABNORMAL HIGH (ref 11.4–15.2)

## 2017-11-16 LAB — APTT
APTT: 42 s — AB (ref 24–36)
aPTT: 47 seconds — ABNORMAL HIGH (ref 24–36)
aPTT: 41 seconds — ABNORMAL HIGH (ref 24–36)
aPTT: 47 seconds — ABNORMAL HIGH (ref 24–36)

## 2017-11-16 LAB — HEPARIN LEVEL (UNFRACTIONATED)
Heparin Unfractionated: 1.04 IU/mL — ABNORMAL HIGH (ref 0.30–0.70)
Heparin Unfractionated: 0.77 IU/mL — ABNORMAL HIGH (ref 0.30–0.70)

## 2017-11-16 LAB — PROCALCITONIN: Procalcitonin: 46.61 ng/mL

## 2017-11-16 LAB — MRSA PCR SCREENING: MRSA by PCR: NEGATIVE

## 2017-11-16 MED ORDER — MORPHINE SULFATE (PF) 4 MG/ML IV SOLN
2.0000 mg | Freq: Once | INTRAVENOUS | Status: AC
  Administered 2017-11-16: 2 mg via INTRAVENOUS
  Filled 2017-11-16: qty 1

## 2017-11-16 MED ORDER — HEPARIN BOLUS VIA INFUSION
1500.0000 [IU] | Freq: Once | INTRAVENOUS | Status: AC
  Administered 2017-11-16: 1500 [IU] via INTRAVENOUS
  Filled 2017-11-16: qty 1500

## 2017-11-16 MED ORDER — HEPARIN BOLUS VIA INFUSION
3000.0000 [IU] | Freq: Once | INTRAVENOUS | Status: AC
  Administered 2017-11-16: 3000 [IU] via INTRAVENOUS
  Filled 2017-11-16: qty 3000

## 2017-11-16 MED ORDER — HEPARIN BOLUS VIA INFUSION
2000.0000 [IU] | Freq: Once | INTRAVENOUS | Status: AC
  Administered 2017-11-16: 2000 [IU] via INTRAVENOUS
  Filled 2017-11-16: qty 2000

## 2017-11-16 MED ORDER — HEPARIN (PORCINE) IN NACL 100-0.45 UNIT/ML-% IJ SOLN
1700.0000 [IU]/h | INTRAMUSCULAR | Status: DC
  Administered 2017-11-16 – 2017-11-17 (×2): 1700 [IU]/h via INTRAVENOUS
  Filled 2017-11-16 (×2): qty 250

## 2017-11-16 MED ORDER — LIP MEDEX EX OINT
TOPICAL_OINTMENT | CUTANEOUS | Status: AC
  Administered 2017-11-16: 02:00:00
  Filled 2017-11-16: qty 7

## 2017-11-16 MED ORDER — HEPARIN (PORCINE) IN NACL 100-0.45 UNIT/ML-% IJ SOLN
1200.0000 [IU]/h | INTRAMUSCULAR | Status: DC
  Administered 2017-11-16: 1200 [IU]/h via INTRAVENOUS
  Filled 2017-11-16: qty 250

## 2017-11-16 MED ORDER — HEPARIN BOLUS VIA INFUSION
2500.0000 [IU] | Freq: Once | INTRAVENOUS | Status: AC
  Administered 2017-11-16: 2500 [IU] via INTRAVENOUS
  Filled 2017-11-16: qty 2500

## 2017-11-16 MED ORDER — SODIUM CHLORIDE 0.9 % IV SOLN: 1500.0000 mg | INTRAVENOUS | Status: DC

## 2017-11-16 MED ORDER — METOPROLOL TARTRATE 5 MG/5ML IV SOLN
2.5000 mg | Freq: Once | INTRAVENOUS | Status: AC
  Administered 2017-11-16: 2.5 mg via INTRAVENOUS
  Filled 2017-11-16: qty 5

## 2017-11-16 MED ORDER — HEPARIN (PORCINE) IN NACL 100-0.45 UNIT/ML-% IJ SOLN: 1400.0000 [IU]/h | INTRAMUSCULAR | Status: DC

## 2017-11-16 NOTE — Discharge Instructions (Signed)

## 2017-11-16 NOTE — Progress Notes (Signed)
PROGRESS NOTE [facility]   [name]   JME:[id number] DOB: Jul 10, 1976  DOA: 11/15/2017 PCP: Venia Carbon, MD   Brief Narrative:  [name] is a 42 year old female with medical history significant for DUB associated with fibroids, antiphospholipid syndrome with multiple DVTs and anemia resented to the emergency department complaining of abdominal pain, fever and chills.  Upon ED evaluation patient was found to be febrile, with severe abdominal pain and CT of the abdomen/Pelvis shows large multiloculated abscess measuring 13.8 x 8.4 x 11.1 cm with surrounding inflammatory concerning for tubo-ovarian abscess, mild bilateral hydronephrosis likely secondary to distal ureteral obstruction due to pelvic pathology.  Hypodense filling defect within the IVC concerning for thrombus.  Mild retroperitoneal and left external iliac lymphadenopathy.  WBC elevated at 24.8, hemoglobin 6.1 and potassium 3.4.  UA grossly abnormal.  Patient was admitted with working diagnosis of sepsis secondary to tubo-ovarian abscess and IVC thrombus complicated with symptomatic anemia.  Patient was started on broad-spectrum antibiotics and IV heparin.  Patient was also transfused 2 units of PRBCs.  Subjective: Patient seen and examined, report having some abdominal pain but improved from yesterday.  Still with some vaginal bleed.  Denies nausea vomiting.  Afebrile  Assessment & Plan: Sepsis due to tubo-ovarian abscess Large TOA measuring 13.8 x 8.4 x 11.1 cm on the left WBC trending down Patient initially treated with Zosyn and vancomycin.  This is an intra-abdominal/pelvic infection and MRSA is negative will discontinue  vancomycin.  Continue Zosyn IV Follow-up blood cultures TOA will likely require drainage, will defer to OB/GYN, OR versus IR Continue supportive treatment  IVC thrombosis Patient with history of antiphospholipid syndrome and recurrent DVTs CT abdomen and pelvis concerning of  IVC thrombosis She has been started on heparin drip Vascular surgery has been consulted and recommended IVC Doppler, will recommend give pain medication prior to procedure to avoid abdominal pain. She might be candidate for IVC filter  Symptomatic acute anemia due chronic bleed - DUB Patient was transfused 2 units of PRBCs in the ED, post H&H with no much improvement, partially related to patient having continues vaginal bleed.  Will transfuse another unit of PRBC.  Can add IV iron in a.m.  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 CT abdomen and pelvis shows enlargement in size of posterior intramural fibroid Patient has previously failed endometrial ablation Was planned for embolization next week We will deferred to GYN input regardin this issue Continue Megace  DVT prophylaxis: Heparin drip Code Status: Full code Family Communication: None at bedside Disposition Plan: Remain in stepdown until infectious process is very controlled  Consultants:    OB/GYN  Vascular surgery  Procedures:   None  Antimicrobials: Anti-infectives (From admission, onward)   Start     Dose/Rate Route Frequency Ordered Stop   11/16/17 1600  vancomycin (VANCOCIN) 1,500 mg in sodium chloride 0.9 % 500 mL IVPB  Status:  Discontinued     1,500 mg 250 mL/hr over 120 Minutes Intravenous Every 24 hours 11/16/17 0125 11/16/17 0849   11/15/17 2359  piperacillin-tazobactam (ZOSYN) IVPB 3.375 g     3.375 g 12.5 mL/hr over 240 Minutes Intravenous Every 8 hours 11/15/17 2325     11/15/17 1500  piperacillin-tazobactam (ZOSYN) IVPB 3.375 g     3.375 g 100 mL/hr over 30 Minutes Intravenous  Once 11/15/17 1450 11/15/17 1553   11/15/17 1500  vancomycin (VANCOCIN) IVPB 1000 mg/200 mL premix  Status:  Discontinued     1,000 mg 200 mL/hr over 60 Minutes Intravenous  Once 11/15/17 1450 11/15/17 1452   11/15/17 1500  vancomycin (VANCOCIN) 2,000 mg in sodium  chloride 0.9 % 500 mL IVPB     2,000 mg 250 mL/hr over 120 Minutes Intravenous  Once 11/15/17 1452 11/15/17 1808           Objective: Vitals:   11/16/17 0604 11/16/17 0607 11/16/17 0700 11/16/17 0740  BP:  106/63 108/63   Pulse: (!) 122 (!) 119 (!) 113   Resp: 18     Temp:    98.5 F (36.9 C)  TempSrc:    Oral  SpO2: 94% 97% 97%   Weight:      Height:        Intake/Output Summary (Last 24 hours) at 11/16/2017 0912 Last data filed at 11/16/2017 0740 Gross per 24 hour  Intake 1345.4 ml  Output 675 ml  Net 670.4 ml   Filed Weights   11/16/17 0031  Weight: 92.3 kg (203 lb 7.8 oz)    Examination:  General exam: Appears calm and comfortable  HEENT: OP moist and clear Respiratory system: Clear to auscultation. No wheezes,crackle or rhonchi Cardiovascular system: S1-S2, tachycardic, no JVD, murmurs, rubs or gallops Gastrointestinal system: Abdomen soft, mild distended tender to palpation on LLQ. + bowel sounds Central nervous system: Alert and oriented. No focal neurological deficits. Extremities: No pedal edema.  Skin: No rashes, lesions or ulcers Psychiatry: Mood & affect appropriate.    Data Reviewed: I have personally reviewed following labs and imaging studies  CBC: Recent Labs  Lab 11/13/17 1457 11/15/17 1110 11/15/17 1505 11/16/17 0806  WBC 24.3* 21.1* 24.8* 20.8*  NEUTROABS 19.9* 16.5* 19.1* 17.3*  HGB  --   --  6.1* 7.3*  HCT 23.9* 20.7* 20.6* 23.5*  MCV 83.0 79.5 83.1 83.0  PLT 246 291 306 341   Basic Metabolic Panel: Recent Labs  Lab 11/15/17 1505 11/16/17 0046  NA 133* 137  K 3.4* 3.7  CL 95* 104  CO2 25 25  GLUCOSE 123* 122*  BUN 13 12  CREATININE 0.99 1.08*  CALCIUM 8.5* 8.0*   GFR: Estimated Creatinine Clearance: 82.1 mL/min (A) (by C-G formula based on SCr of 1.08 mg/dL (H)). Liver Function Tests: Recent Labs  Lab 11/15/17 1505  AST 23  ALT 18  ALKPHOS 77  BILITOT 0.6  PROT 7.5  ALBUMIN 2.2*   Recent Labs  Lab  11/15/17 1505  LIPASE 14   No results for input(s): AMMONIA in the last 168 hours. Coagulation Profile: Recent Labs  Lab 11/16/17 0046  INR 1.78   Cardiac Enzymes: No results for input(s): CKTOTAL, CKMB, CKMBINDEX, TROPONINI in the last 168 hours. BNP (last 3 results) No results for input(s): PROBNP in the last 8760 hours. HbA1C: No results for input(s): HGBA1C in the last 72 hours. CBG: No results for input(s): GLUCAP in the last 168 hours. Lipid Profile: No results for input(s): CHOL, HDL, LDLCALC, TRIG, CHOLHDL, LDLDIRECT in the last 72 hours. Thyroid Function Tests: No results for input(s): TSH, T4TOTAL, FREET4, T3FREE, THYROIDAB in the last 72 hours. Anemia Panel: Recent Labs    11/13/17 1457 11/15/17 1110  FERRITIN 218 273*  TIBC 236 260  IRON 8* 16*   Sepsis Labs: Recent Labs  Lab 11/15/17 1522 11/16/17 0046  PROCALCITON  --  46.61  LATICACIDVEN 1.42  --     Recent Results (from the past 240 hour(s))  MRSA PCR Screening  Status: None   Collection Time: 11/16/17 12:28 AM  Result Value Ref Range Status   MRSA by PCR NEGATIVE NEGATIVE Final    Comment:        The GeneXpert MRSA Assay (FDA approved for NASAL specimens only), is one component of a comprehensive MRSA colonization surveillance program. It is not intended to diagnose MRSA infection nor to guide or monitor treatment for MRSA infections. Performed at [facility], [place] [address] [place], [place] [address]       Radiology Studies: Dg Chest 2 View  Result Date: 11/15/2017 CLINICAL DATA:  Low-grade fever, tachycardia, leukocytosis EXAM: CHEST - 2 VIEW COMPARISON:  CT chest 04/08/2008 FINDINGS: Normal heart size, mediastinal contours, and pulmonary vascularity. Lungs clear. No pulmonary infiltrate, pleural effusion or pneumothorax. Bones unremarkable. IMPRESSION: No acute abnormalities. Electronically Signed   By: Lavonia Dana M.D.   On: 11/15/2017 16:43   Ct Abdomen  Pelvis W Contrast  Result Date: 11/15/2017 CLINICAL DATA:  Fever, tachycardia, and leukocytosis. History of uterine fibroids. EXAM: CT ABDOMEN AND PELVIS WITH CONTRAST TECHNIQUE: Multidetector CT imaging of the abdomen and pelvis was performed using the standard protocol following bolus administration of intravenous contrast. CONTRAST:  100 cc ISOVUE-300 IOPAMIDOL (ISOVUE-300) INJECTION 61% COMPARISON:  CT abdomen pelvis dated August 02, 2017. FINDINGS: Lower chest: No acute abnormality. Hepatobiliary: No focal liver abnormality. The gallbladder is distended without wall thickening, gallstones, or biliary dilatation. Pancreas: Unremarkable. No pancreatic ductal dilatation or surrounding inflammatory changes. Spleen: Normal in size without focal abnormality. Adrenals/Urinary Tract: The adrenal glands are unremarkable. Delayed enhancement and excretion of contrast from the bilateral kidneys. Mild bilateral hydronephrosis. No focal renal lesion. No renal or ureteral calculi. Mild anterior and rightward displacement of the bladder, which is otherwise unremarkable in appearance. Stomach/Bowel: Stomach is within normal limits. Appendix is not definitely visualized, however there are no secondary inflammatory changes at the base of the cecum. No evidence of bowel wall thickening, distention, or inflammatory changes. Vascular/Lymphatic: Hypodense filling defect within the IVC. Multiple enlarged left external iliac lymph nodes measuring up to 13 mm in short axis. Multiple mildly enlarged retroperitoneal lymph nodes measuring up to 12 mm in short axis. Reproductive: Interval increase in size of the uterus, now measuring up to 11.2 x 17.5 x 18.4 cm, previously 11.0 x 13.2 x 15.5 cm, with interval increase in size of the large posterior intramural fibroid. Interval increase in size of the multiloculated, fluid and air-filled left adnexal mass, measuring 13.8 x 8.4 x 11.1 cm. There are surrounding inflammatory changes. The  right adnexa is unremarkable. Other: Small amount of free fluid in the pelvis. No pneumoperitoneum. Small fat containing umbilical hernia. Musculoskeletal: No acute or significant osseous findings. IMPRESSION: 1. Large multiloculated, fluid and air-filled left adnexal mass, measuring 13.8 x 8.4 x 11.1 cm, with surrounding inflammatory changes, concerning for tubo-ovarian abscess. 2. Interval increase in size of the uterus due to enlargement of a large posterior intramural fibroid. 3. New mild bilateral hydronephrosis and delayed renal function, likely secondary to bilateral distal ureteral obstruction due to pelvic pathology. 4. Hypodense filling defect within the IVC, concerning for thrombus. 5. Mild retroperitoneal and left external iliac lymphadenopathy is nonspecific, but may be reactive. These results were called by telephone at the time of interpretation on 11/15/2017 at 6:34 pm to Dr. Carmon Sails , who verbally acknowledged these results. Electronically Signed   By: Titus Dubin M.D.   On: 11/15/2017 18:36   Dg Abd 2 Views  Result  Date: 11/15/2017 CLINICAL DATA:  Abdominal pain.  Fever.  Fibroids. EXAM: ABDOMEN - 2 VIEW COMPARISON:  None. FINDINGS: The bowel gas pattern is normal. There is no evidence of free air on the decubitus view. No radio-opaque calculi or other significant radiographic abnormality is seen. IMPRESSION: Negative. Electronically Signed   By: Staci Righter M.D.   On: 11/15/2017 16:41      Scheduled Meds: . ferrous sulfate  325 mg Oral Daily  . megestrol  80 mg Oral BID  . sodium chloride flush  3 mL Intravenous Q12H   Continuous Infusions: . heparin 1,400 Units/hr (11/16/17 0905)  . lactated ringers 100 mL/hr at 11/16/17 0032  . piperacillin-tazobactam 3.375 g (11/16/17 0736)     LOS: 1 day    Time spent: Total of 35 minutes spent with pt, greater than 50% of which was spent in discussion of  treatment, counseling and coordination of care   Chipper Oman,  MD Pager: Text Page via [email or web address]   If 7PM-7AM, please contact night-coverage [email or web address] 11/16/2017, 9:12 AM   Note - This record has been created using [employer]. Chart creation errors have been sought, but may not always have been located. Such creation errors do not reflect on the standard of medical care.

## 2017-11-16 NOTE — Consult Note (Signed)
Reason for Consult:Tubo-ovarian abscess   [name] is an 42 y.o. female P1001 with history of fibroid uterus and associated DUB currently medically managed with Megace. Patient was scheduled for uterine artery embolization next week for the management of her fibroid uterus. She presented to the ED with worsening abdominal pain and chills/feverts. Patient reports significant lower abdominal pain slightly improved since admission.   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 Patient is currently admitted for the treatment of a TOA and concurrent IVC thrombus.      Past Medical History:  Diagnosis Date  . Anemia 09/2017   REQUIRING A TRANSFUSION  . Antiphospholipid syndrome ([place])   . Breast cyst    BILATERAL  . DVT (deep venous thrombosis) ([place])   . Dysfunctional uterine bleeding   . Fibroids   . Obesity     Past Surgical History:  Procedure Laterality Date  . CESAREAN SECTION    . DILITATION & CURRETTAGE/HYSTROSCOPY WITH HYDROTHERMAL ABLATION N/A 10/08/2014   Procedure: DILATATION & CURETTAGE/HYSTEROSCOPY WITH HYDROTHERMAL ABLATION;  Surgeon: Osborne Oman, MD;  Location: [facility];  Service: Gynecology;  Laterality: N/A;  . IR RADIOLOGIST EVAL & MGMT  10/10/2017  . MYOMECTOMY      Family History  Problem Relation Age of Onset  . Diabetes Paternal Grandmother   . Heart disease Neg Hx   . Hypertension Neg Hx   . Cancer Neg Hx        breast or colon cancer    Social History:  reports that she has never smoked. She has never used smokeless tobacco. She reports that she drinks alcohol. She reports that she does not use drugs.  Allergies: No Known Allergies  Medications: I have reviewed the patient's current medications.  ROS See pertinent in HPI  Blood pressure (!) 148/88, pulse (!) 126, temperature 98.6 F (37 C), temperature source Oral, resp. rate 20, height 5' 9"  (1.753 m), weight 203 lb 7.8 oz (92.3 kg), SpO2 98 %. Physical Exam   GENERAL:  Well-developed, well-nourished female in no acute distress.  LUNGS: Clear to auscultation bilaterally.  HEART: Regular rate and rhythm. ABDOMEN: Soft, diffusely tender in lower quadrant bilaterally, no rebound, no guarding. Palpable fibroid uterus PELVIC: Deferred due to pain EXTREMITIES: No cyanosis, clubbing, or edema, 2+ distal pulses.   Results for orders placed or performed during the hospital encounter of 11/15/17 (from the past 48 hour(s))  Prepare RBC     Status: None   Collection Time: 11/15/17  3:01 PM  Result Value Ref Range   Order Confirmation      ORDER PROCESSED BY BLOOD BANK Performed at [facility] [address] [place], [place] [address]   Comprehensive metabolic panel     Status: Abnormal   Collection Time: 11/15/17  3:05 PM  Result Value Ref Range   Sodium 133 (L) 135 - 145 mmol/L   Potassium 3.4 (L) 3.5 - 5.1 mmol/L   Chloride 95 (L) 101 - 111 mmol/L   CO2 25 22 - 32 mmol/L   Glucose, Bld 123 (H) 65 - 99 mg/dL   BUN 13 6 - 20 mg/dL   Creatinine, Ser 0.99 0.44 - 1.00 mg/dL   Calcium 8.5 (L) 8.9 - 10.3 mg/dL   Total Protein 7.5 6.5 - 8.1 g/dL   Albumin 2.2 (L) 3.5 - 5.0 g/dL   AST 23 15 - 41 U/L   ALT 18 14 - 54 U/L   Alkaline Phosphatase  77 38 - 126 U/L   Total Bilirubin 0.6 0.3 - 1.2 mg/dL   GFR calc non Af Amer >60 >60 mL/min   GFR calc Af Amer >60 >60 mL/min    Comment: (NOTE) The eGFR has been calculated using the CKD EPI equation. This calculation has not been validated in all clinical situations. eGFR's persistently <60 mL/min signify possible Chronic Kidney Disease.    Anion gap 13 5 - 15    Comment: Performed at [facility], [place] [address], [place], [place] [address]  CBC WITH DIFFERENTIAL     Status: Abnormal   Collection Time: 11/15/17  3:05 PM  Result Value Ref Range   WBC 24.8 (H) 4.0 - 10.5 K/uL   RBC 2.48 (L) 3.87 - 5.11 MIL/uL   Hemoglobin 6.1 (LL) 12.0 - 15.0 g/dL    Comment: REPEATED TO  VERIFY CRITICAL RESULT CALLED TO, READ BACK BY AND VERIFIED WITH: Erasmo Score [id number] @ 1555 BY J SCOTTON    HCT 20.6 (L) 36.0 - 46.0 %   MCV 83.1 78.0 - 100.0 fL   MCH 24.6 (L) 26.0 - 34.0 pg   MCHC 29.6 (L) 30.0 - 36.0 g/dL   RDW 19.5 (H) 11.5 - 15.5 %   Platelets 306 150 - 400 K/uL   Neutrophils Relative % 77 %   Lymphocytes Relative 9 %   Monocytes Relative 14 %   Eosinophils Relative 0 %   Basophils Relative 0 %   nRBC 1 (H) 0 /100 WBC   Neutro Abs 19.1 (H) 1.7 - 7.7 K/uL   Lymphs Abs 2.2 0.7 - 4.0 K/uL   Monocytes Absolute 3.5 (H) 0.1 - 1.0 K/uL   Eosinophils Absolute 0.0 0.0 - 0.7 K/uL   Basophils Absolute 0.0 0.0 - 0.1 K/uL   RBC Morphology POLYCHROMASIA PRESENT     Comment: TARGET CELLS   WBC Morphology TOXIC GRANULATION     Comment: DOHLE BODIES Performed at [facility], [place] [address], [place], [place] [address]   Lipase, blood     Status: None   Collection Time: 11/15/17  3:05 PM  Result Value Ref Range   Lipase 14 11 - 51 U/L    Comment: Performed at [facility], [place] [address], [place], [place] [address]  Blood Culture (routine x 2)     Status: None (Preliminary result)   Collection Time: 11/15/17  3:06 PM  Result Value Ref Range   Specimen Description      BLOOD RIGHT ANTECUBITAL Performed at [facility] [address] [place], [place] [address]    Special Requests      BOTTLES DRAWN AEROBIC AND ANAEROBIC Blood Culture adequate volume Performed at [facility] [address] [place], [place] [address]    Culture      NO GROWTH < 24 HOURS Performed at [facility] [address], [place], [place] [address]    Report Status PENDING   Blood Culture (routine x 2)     Status: None (Preliminary result)   Collection Time: 11/15/17  3:06 PM  Result Value Ref Range   Specimen Description      BLOOD LEFT ANTECUBITAL Performed at [facility] [address], [place], [place] [address]    Special Requests      BOTTLES DRAWN AEROBIC AND ANAEROBIC Blood Culture adequate volume Performed at [facility] [address], [place], [place] [address]  Culture      NO GROWTH < 24 HOURS Performed at [facility], [place] [address] [place], [place] [address]    Report Status PENDING   I-Stat Beta hCG blood, ED ([facility], [facility], [facility] only)     Status: Abnormal   Collection Time: 11/15/17  3:14 PM  Result Value Ref Range   I-stat hCG, quantitative 11.7 (H) <5 mIU/mL   Comment 3            Comment:   GEST. AGE      CONC.  (mIU/mL)   <=1 WEEK        5 - 50     2 WEEKS       50 - 500     3 WEEKS       100 - 10,000     4 WEEKS     1,000 - 30,000        FEMALE AND NON-PREGNANT FEMALE:     LESS THAN 5 mIU/mL   I-Stat CG4 Lactic Acid, ED  (not at  [facility])     Status: None   Collection Time: 11/15/17  3:22 PM  Result Value Ref Range   Lactic Acid, Venous 1.42 0.5 - 1.9 mmol/L  Influenza panel by PCR (type A & B)     Status: None   Collection Time: 11/15/17  3:47 PM  Result Value Ref Range   Influenza A By PCR NEGATIVE NEGATIVE   Influenza B By PCR NEGATIVE NEGATIVE    Comment: (NOTE) The Xpert Xpress Flu assay is intended as an aid in the diagnosis of  influenza and should not be used as a sole basis for treatment.  This  assay is FDA approved for nasopharyngeal swab specimens only. Nasal  washings and aspirates are unacceptable for Xpert Xpress Flu testing. Performed at [facility], [place] [address], [place], [place] [address]   Urinalysis, Routine w reflex microscopic     Status: Abnormal   Collection Time: 11/15/17  5:38 PM  Result Value Ref Range   Color, Urine YELLOW YELLOW   APPearance HAZY (A) CLEAR   Specific Gravity, Urine 1.015 1.005 - 1.030   pH 6.0 5.0 - 8.0   Glucose, UA NEGATIVE NEGATIVE mg/dL   Hgb urine dipstick LARGE (A) NEGATIVE   Bilirubin Urine NEGATIVE NEGATIVE   Ketones, ur  NEGATIVE NEGATIVE mg/dL   Protein, ur 30 (A) NEGATIVE mg/dL   Nitrite NEGATIVE NEGATIVE   Leukocytes, UA SMALL (A) NEGATIVE   RBC / HPF TOO NUMEROUS TO COUNT 0 - 5 RBC/hpf   WBC, UA TOO NUMEROUS TO COUNT 0 - 5 WBC/hpf   Bacteria, UA NONE SEEN NONE SEEN   Squamous Epithelial / LPF 0-5 (A) NONE SEEN    Comment: Performed at [facility], [place] [address] [place], [place] [address]  MRSA PCR Screening     Status: None   Collection Time: 11/16/17 12:28 AM  Result Value Ref Range   MRSA by PCR NEGATIVE NEGATIVE    Comment:        The GeneXpert MRSA Assay (FDA approved for NASAL specimens only), is one component of a comprehensive MRSA colonization surveillance program. It is not intended to diagnose MRSA infection nor to guide or monitor treatment for MRSA infections. Performed at [facility], [place] [address], [place], [place] [address]   APTT     Status: Abnormal   Collection Time: 11/16/17 12:46 AM  Result Value Ref  Range   aPTT 42 (H) 24 - 36 seconds    Comment:        IF BASELINE aPTT IS ELEVATED, SUGGEST PATIENT RISK ASSESSMENT BE USED TO DETERMINE APPROPRIATE ANTICOAGULANT THERAPY. Performed at [facility], [place] [address] [place], [place] [address]   Protime-INR     Status: Abnormal   Collection Time: 11/16/17 12:46 AM  Result Value Ref Range   Prothrombin Time 20.5 (H) 11.4 - 15.2 seconds   INR 1.78     Comment: Performed at [facility], [place] [address] [place], [place] [address]  Heparin level (unfractionated)     Status: Abnormal   Collection Time: 11/16/17 12:46 AM  Result Value Ref Range   Heparin Unfractionated 1.04 (H) 0.30 - 0.70 IU/mL    Comment:        IF HEPARIN RESULTS ARE BELOW EXPECTED VALUES, AND PATIENT DOSAGE HAS BEEN CONFIRMED, SUGGEST FOLLOW UP TESTING OF ANTITHROMBIN III LEVELS. RESULTS CONFIRMED BY MANUAL DILUTION Performed at [facility]  [address], [place],  [address]   Basic metabolic panel     Status: Abnormal   Collection Time: 11/16/17 12:46 AM  Result Value Ref Range   Sodium 137 135 - 145 mmol/L   Potassium 3.7 3.5 - 5.1 mmol/L   Chloride 104 101 - 111 mmol/L   CO2 25 22 - 32 mmol/L   Glucose, Bld 122 (H) 65 - 99 mg/dL   BUN 12 6 - 20 mg/dL   Creatinine, Ser 1.08 (H) 0.44 - 1.00 mg/dL   Calcium 8.0 (L) 8.9 - 10.3 mg/dL   GFR calc non Af Amer >60 >60 mL/min   GFR calc Af Amer >60 >60 mL/min    Comment: (NOTE) The eGFR has been calculated using the CKD EPI equation. This calculation has not been validated in all clinical situations. eGFR's persistently <60 mL/min signify possible Chronic Kidney Disease.    Anion gap 8 5 - 15    Comment: Performed at [facility], [place] [address], [place],  [address]  Procalcitonin     Status: None   Collection Time: 11/16/17 12:46 AM  Result Value Ref Range   Procalcitonin 46.61 ng/mL    Comment:        Interpretation: PCT >= 10 ng/mL: Important systemic inflammatory response, almost exclusively due to severe bacterial sepsis or septic shock. (NOTE)       Sepsis PCT Algorithm           Lower Respiratory Tract                                      Infection PCT Algorithm    ----------------------------     ----------------------------         PCT < 0.25 ng/mL                PCT < 0.10 ng/mL         Strongly encourage             Strongly discourage   discontinuation of antibiotics    initiation of antibiotics    ----------------------------     -----------------------------       PCT 0.25 - 0.50 ng/mL            PCT 0.10 - 0.25 ng/mL  OR       >80% decrease in PCT            Discourage initiation of                                            antibiotics      Encourage discontinuation           of antibiotics    ----------------------------     -----------------------------         PCT >= 0.50 ng/mL              PCT 0.26 -  0.50 ng/mL                AND       <80% decrease in PCT             Encourage initiation of                                             antibiotics       Encourage continuation           of antibiotics    ----------------------------     -----------------------------        PCT >= 0.50 ng/mL                  PCT > 0.50 ng/mL               AND         increase in PCT                  Strongly encourage                                      initiation of antibiotics    Strongly encourage escalation           of antibiotics                                     -----------------------------                                           PCT <= 0.25 ng/mL                                                 OR                                        > 80% decrease in PCT                                     Discontinue / Do not initiate  antibiotics Performed at [facility], [place] [address], [place], [place] [address]   Heparin level (unfractionated)     Status: Abnormal   Collection Time: 11/16/17  8:06 AM  Result Value Ref Range   Heparin Unfractionated 0.77 (H) 0.30 - 0.70 IU/mL    Comment:        IF HEPARIN RESULTS ARE BELOW EXPECTED VALUES, AND PATIENT DOSAGE HAS BEEN CONFIRMED, SUGGEST FOLLOW UP TESTING OF ANTITHROMBIN III LEVELS. Performed at [facility], [place] [address] [place], [place] [address]   APTT     Status: Abnormal   Collection Time: 11/16/17  8:06 AM  Result Value Ref Range   aPTT 47 (H) 24 - 36 seconds    Comment:        IF BASELINE aPTT IS ELEVATED, SUGGEST PATIENT RISK ASSESSMENT BE USED TO DETERMINE APPROPRIATE ANTICOAGULANT THERAPY. Performed at [facility], [place] [address] [place], [place] [address]   CBC with Differential/Platelet     Status: Abnormal   Collection Time: 11/16/17  8:06 AM  Result Value Ref Range   WBC 20.8 (H) 4.0 - 10.5 K/uL   RBC 2.83 (L)  3.87 - 5.11 MIL/uL   Hemoglobin 7.3 (L) 12.0 - 15.0 g/dL   HCT 23.5 (L) 36.0 - 46.0 %   MCV 83.0 78.0 - 100.0 fL   MCH 25.8 (L) 26.0 - 34.0 pg   MCHC 31.1 30.0 - 36.0 g/dL   RDW 18.1 (H) 11.5 - 15.5 %   Platelets 264 150 - 400 K/uL   Neutrophils Relative % 83 %   Lymphocytes Relative 10 %   Monocytes Relative 6 %   Eosinophils Relative 1 %   Basophils Relative 0 %   Neutro Abs 17.3 (H) 1.7 - 7.7 K/uL   Lymphs Abs 2.1 0.7 - 4.0 K/uL   Monocytes Absolute 1.2 (H) 0.1 - 1.0 K/uL   Eosinophils Absolute 0.2 0.0 - 0.7 K/uL   Basophils Absolute 0.0 0.0 - 0.1 K/uL   RBC Morphology POLYCHROMASIA PRESENT    WBC Morphology MILD LEFT SHIFT (1-5% METAS, OCC MYELO, OCC BANDS)     Comment: DOHLE BODIES TOXIC GRANULATION Performed at [facility] [address], [place], [place] [address]   Prepare RBC     Status: None   Collection Time: 11/16/17  9:17 AM  Result Value Ref Range   Order Confirmation      ORDER PROCESSED BY BLOOD BANK Performed at [facility], [place] [address], [place],  [address]     Dg Chest 2 View  Result Date: 11/15/2017 CLINICAL DATA:  Low-grade fever, tachycardia, leukocytosis EXAM: CHEST - 2 VIEW COMPARISON:  CT chest 04/08/2008 FINDINGS: Normal heart size, mediastinal contours, and pulmonary vascularity. Lungs clear. No pulmonary infiltrate, pleural effusion or pneumothorax. Bones unremarkable. IMPRESSION: No acute abnormalities. Electronically Signed   By: Lavonia Dana M.D.   On: 11/15/2017 16:43   Ct Abdomen Pelvis W Contrast  Result Date: 11/15/2017 CLINICAL DATA:  Fever, tachycardia, and leukocytosis. History of uterine fibroids. EXAM: CT ABDOMEN AND PELVIS WITH CONTRAST TECHNIQUE: Multidetector CT imaging of the abdomen and pelvis was performed using the standard protocol following bolus administration of intravenous contrast. CONTRAST:  100 cc ISOVUE-300 IOPAMIDOL (ISOVUE-300) INJECTION 61% COMPARISON:  CT abdomen pelvis  dated August 02, 2017. FINDINGS: Lower chest: No acute abnormality. Hepatobiliary: No focal liver abnormality. The gallbladder is distended without wall thickening, gallstones, or biliary dilatation. Pancreas: Unremarkable. No pancreatic ductal  dilatation or surrounding inflammatory changes. Spleen: Normal in size without focal abnormality. Adrenals/Urinary Tract: The adrenal glands are unremarkable. Delayed enhancement and excretion of contrast from the bilateral kidneys. Mild bilateral hydronephrosis. No focal renal lesion. No renal or ureteral calculi. Mild anterior and rightward displacement of the bladder, which is otherwise unremarkable in appearance. Stomach/Bowel: Stomach is within normal limits. Appendix is not definitely visualized, however there are no secondary inflammatory changes at the base of the cecum. No evidence of bowel wall thickening, distention, or inflammatory changes. Vascular/Lymphatic: Hypodense filling defect within the IVC. Multiple enlarged left external iliac lymph nodes measuring up to 13 mm in short axis. Multiple mildly enlarged retroperitoneal lymph nodes measuring up to 12 mm in short axis. Reproductive: Interval increase in size of the uterus, now measuring up to 11.2 x 17.5 x 18.4 cm, previously 11.0 x 13.2 x 15.5 cm, with interval increase in size of the large posterior intramural fibroid. Interval increase in size of the multiloculated, fluid and air-filled left adnexal mass, measuring 13.8 x 8.4 x 11.1 cm. There are surrounding inflammatory changes. The right adnexa is unremarkable. Other: Small amount of free fluid in the pelvis. No pneumoperitoneum. Small fat containing umbilical hernia. Musculoskeletal: No acute or significant osseous findings. IMPRESSION: 1. Large multiloculated, fluid and air-filled left adnexal mass, measuring 13.8 x 8.4 x 11.1 cm, with surrounding inflammatory changes, concerning for tubo-ovarian abscess. 2. Interval increase in size of the uterus due  to enlargement of a large posterior intramural fibroid. 3. New mild bilateral hydronephrosis and delayed renal function, likely secondary to bilateral distal ureteral obstruction due to pelvic pathology. 4. Hypodense filling defect within the IVC, concerning for thrombus. 5. Mild retroperitoneal and left external iliac lymphadenopathy is nonspecific, but may be reactive. These results were called by telephone at the time of interpretation on 11/15/2017 at 6:34 pm to Dr. Carmon Sails , who verbally acknowledged these results. Electronically Signed   By: Titus Dubin M.D.   On: 11/15/2017 18:36   Dg Abd 2 Views  Result Date: 11/15/2017 CLINICAL DATA:  Abdominal pain.  Fever.  Fibroids. EXAM: ABDOMEN - 2 VIEW COMPARISON:  None. FINDINGS: The bowel gas pattern is normal. There is no evidence of free air on the decubitus view. No radio-opaque calculi or other significant radiographic abnormality is seen. IMPRESSION: Negative. Electronically Signed   By: Staci Righter M.D.   On: 11/15/2017 16:41    Assessment/Plan: 42 yo with CT findings suspicious for a left tubo-ovarian abscess with concerns for IVC thrombus - Continue Zosyn until afebrile for at least 48 hours - Interventional radiology consulted for possible IR drainage  - When ready for discharge, should be treated with a 14- day course of doxycycline 100 mg BID and Flagyl 500 mg BID. - Will arrange for outpatient follow appointment with GYN - Patient with recurrent DVT on Xarelto and now IVC thrombus, poor surgical candidate for hysterectomy - Thank you for this consult. Please contact attending on call at [phone] with any other questions  Hermann Dottavio 11/16/2017

## 2017-11-16 NOTE — Progress Notes (Signed)
ANTICOAGULATION CONSULT NOTE  Pharmacy Consult for heparin Indication: IVC thrombosis  No Known Allergies  Patient Measurements: Height: 5' 9"  (175.3 cm) Weight: 203 lb 7.8 oz (92.3 kg) IBW/kg (Calculated) : 66.2 Heparin Dosing Weight: 85.6 kg  Vital Signs: Temp: 99.1 F (37.3 C) (03/22 1533) Temp Source: Oral (03/22 1533) BP: 148/88 (03/22 1415) Pulse Rate: 126 (03/22 1415)  Labs: Recent Labs    11/15/17 1110 11/15/17 1505 11/16/17 0046 11/16/17 0806 11/16/17 1536  HGB  --  6.1*  --  7.3*  --   HCT 20.7* 20.6*  --  23.5*  --   PLT 291 306  --  264  --   APTT  --   --  42* 47* 41*  LABPROT  --   --  20.5*  --   --   INR  --   --  1.78  --   --   HEPARINUNFRC  --   --  1.04* 0.77*  --   CREATININE  --  0.99 1.08*  --   --     Estimated Creatinine Clearance: 82.1 mL/min (A) (by C-G formula based on SCr of 1.08 mg/dL (H)).  Medications:  Medications Prior to Admission  Medication Sig Dispense Refill Last Dose  . acetaminophen (TYLENOL) 500 MG tablet Take 1,000 mg by mouth every 6 (six) hours as needed for fever.   11/14/2017 at Unknown time  . Ferrous Sulfate (IRON) 325 (65 Fe) MG TABS Take 1 tablet (325 mg total) by mouth daily. 30 each 11 11/14/2017 at Unknown time  . megestrol (MEGACE) 40 MG tablet Take 2 tablets (80 mg total) by mouth 2 (two) times daily. 120 tablet 5 11/14/2017 at Unknown time  . Multiple Vitamin (MULTIVITAMIN WITH MINERALS) TABS tablet Take 1 tablet by mouth daily.   Past Week at Unknown time  . traMADol (ULTRAM) 50 MG tablet Take 1 tablet (50 mg total) by mouth 3 (three) times daily as needed. (Patient taking differently: Take 50 mg by mouth 3 (three) times daily as needed for moderate pain. ) 30 tablet 0 11/15/2017 at Unknown time  . XARELTO 20 MG TABS tablet TAKE 1 TABLET (20 MG TOTAL) BY MOUTH DAILY WITH SUPPER. 30 tablet 11 11/14/2017 at 0900 pm    Assessment: 42 yo on Xarelto PTA for antiphospholipid syndrome with multiple clots. Heparin per  pharmacy for  possible IVC thrombosis. Xarelto 20 qd PTA LD 3/20 at 2100. Baseline aPTT 42, baseline HL 1.04. Baseline INR 1.79 (elevated 2nd xarelto). May need IVC filter.   Today, 11/16/2017:  Hg up to 7.3 after PRBC x 3; Plt wnl  APTT subtherapeutic and now lower despite rate increase. Per RN, line/pump/IV site appear normal  HL 0.77 (still affected by xarelto)  Patient normally has vaginal/uterine bleeding d/t fibroids, but reports her bleeding is actually less than at home, which is consistent with subtherapeutic anticoagulation  Goal of Therapy:  Heparin level 0.3-0.7 units/ml aPTT 66-102 seconds Monitor platelets by anticoagulation protocol: Yes   Plan:   3000 unit heparin bolus then increase drip to 1700 units/hr and check aPTT in 6 hours  Daily HL and aPTT  Daily CBC (MD checking BID)  Reuel Boom, PharmD, BCPS [phone] 11/16/2017, 4:09 PM

## 2017-11-16 NOTE — Progress Notes (Signed)
Referring Physician(s): Constant,P  Supervising Physician: Henn,A  Patient Status:  [facility] - In-pt  Chief Complaint:  Pelvic pain/tubo-ovarian abscess  Subjective: Patient familiar to IR service from recent consultation with Dr. Earleen Newport on 10/10/17 to discuss treatment options for symptomatic uterine fibroids.  She has a known history of menorrhagia as well as antiphospholipid syndrome with multiple LE DVTs (on xarelto as OP) and anemia who recently presented to the ED with abdominal pain, fever and chills.  Subsequent imaging revealed: 1. Large multiloculated, fluid and air-filled left adnexal mass, measuring 13.8 x 8.4 x 11.1 cm, with surrounding inflammatory changes, concerning for tubo-ovarian abscess. 2. Interval increase in size of the uterus due to enlargement of a large posterior intramural fibroid. 3. New mild bilateral hydronephrosis and delayed renal function, likely secondary to bilateral distal ureteral obstruction due to pelvic pathology. 4. Hypodense filling defect within the IVC, concerning for thrombus. 5. Mild retroperitoneal and left external iliac lymphadenopathy is nonspecific, but may be reactive  Current labs include CBC 20.8, hemoglobin 7.3, platelets 264k, creatinine 1.08, potassium 3.7, PT 20.5, INR 1.78.  She is currently on IV heparin therapy.  She denies chest pain, dyspnea, cough, nausea, vomiting or back pain.  Past Medical History:  Diagnosis Date  . Anemia 09/2017   REQUIRING A TRANSFUSION  . Antiphospholipid syndrome ([place])   . Breast cyst    BILATERAL  . DVT (deep venous thrombosis) ([place])   . Dysfunctional uterine bleeding   . Fibroids   . Obesity    Past Surgical History:  Procedure Laterality Date  . CESAREAN SECTION    . DILITATION & CURRETTAGE/HYSTROSCOPY WITH HYDROTHERMAL ABLATION N/A 10/08/2014   Procedure: DILATATION & CURETTAGE/HYSTEROSCOPY WITH HYDROTHERMAL ABLATION;  Surgeon: Osborne Oman, MD;  Location: [facility];  Service:  Gynecology;  Laterality: N/A;  . IR RADIOLOGIST EVAL & MGMT  10/10/2017  . MYOMECTOMY       Allergies: Patient has no known allergies.  Medications: Prior to Admission medications   Medication Sig Start Date End Date Taking? Authorizing Provider  acetaminophen (TYLENOL) 500 MG tablet Take 1,000 mg by mouth every 6 (six) hours as needed for fever.   Yes [provider]  Ferrous Sulfate (IRON) 325 (65 Fe) MG TABS Take 1 tablet (325 mg total) by mouth daily. 10/22/17  Yes Venia Carbon, MD  megestrol (MEGACE) 40 MG tablet Take 2 tablets (80 mg total) by mouth 2 (two) times daily. 10/08/17  Yes Anyanwu, Sallyanne Havers, MD  Multiple Vitamin (MULTIVITAMIN WITH MINERALS) TABS tablet Take 1 tablet by mouth daily.   Yes [provider]  traMADol (ULTRAM) 50 MG tablet Take 1 tablet (50 mg total) by mouth 3 (three) times daily as needed. Patient taking differently: Take 50 mg by mouth 3 (three) times daily as needed for moderate pain.  11/12/17  Yes Venia Carbon, MD  XARELTO 20 MG TABS tablet TAKE 1 TABLET (20 MG TOTAL) BY MOUTH DAILY WITH SUPPER. 09/13/17  Yes Venia Carbon, MD     Vital Signs: BP (!) 144/86   Pulse (!) 113   Temp 99.1 F (37.3 C) (Oral)   Resp (!) 22   Ht 5' 9"  (1.753 m)   Wt 203 lb 7.8 oz (92.3 kg)   SpO2 100%   BMI 30.05 kg/m   Physical Exam awake, alert.  Chest clear to auscultation bilaterally.  Heart with tachycardic but regular rhythm.  Abdomen soft, mildly distended, pelvic tenderness to palpation, fibroid uterus, positive bowel  sounds.  Trace  pretibial edema bilaterally.  Imaging: Dg Chest 2 View  Result Date: 11/15/2017 CLINICAL DATA:  Low-grade fever, tachycardia, leukocytosis EXAM: CHEST - 2 VIEW COMPARISON:  CT chest 04/08/2008 FINDINGS: Normal heart size, mediastinal contours, and pulmonary vascularity. Lungs clear. No pulmonary infiltrate, pleural effusion or pneumothorax. Bones unremarkable. IMPRESSION: No acute abnormalities.  Electronically Signed   By: Lavonia Dana M.D.   On: 11/15/2017 16:43   Ct Abdomen Pelvis W Contrast  Result Date: 11/15/2017 CLINICAL DATA:  Fever, tachycardia, and leukocytosis. History of uterine fibroids. EXAM: CT ABDOMEN AND PELVIS WITH CONTRAST TECHNIQUE: Multidetector CT imaging of the abdomen and pelvis was performed using the standard protocol following bolus administration of intravenous contrast. CONTRAST:  100 cc ISOVUE-300 IOPAMIDOL (ISOVUE-300) INJECTION 61% COMPARISON:  CT abdomen pelvis dated August 02, 2017. FINDINGS: Lower chest: No acute abnormality. Hepatobiliary: No focal liver abnormality. The gallbladder is distended without wall thickening, gallstones, or biliary dilatation. Pancreas: Unremarkable. No pancreatic ductal dilatation or surrounding inflammatory changes. Spleen: Normal in size without focal abnormality. Adrenals/Urinary Tract: The adrenal glands are unremarkable. Delayed enhancement and excretion of contrast from the bilateral kidneys. Mild bilateral hydronephrosis. No focal renal lesion. No renal or ureteral calculi. Mild anterior and rightward displacement of the bladder, which is otherwise unremarkable in appearance. Stomach/Bowel: Stomach is within normal limits. Appendix is not definitely visualized, however there are no secondary inflammatory changes at the base of the cecum. No evidence of bowel wall thickening, distention, or inflammatory changes. Vascular/Lymphatic: Hypodense filling defect within the IVC. Multiple enlarged left external iliac lymph nodes measuring up to 13 mm in short axis. Multiple mildly enlarged retroperitoneal lymph nodes measuring up to 12 mm in short axis. Reproductive: Interval increase in size of the uterus, now measuring up to 11.2 x 17.5 x 18.4 cm, previously 11.0 x 13.2 x 15.5 cm, with interval increase in size of the large posterior intramural fibroid. Interval increase in size of the multiloculated, fluid and air-filled left adnexal mass,  measuring 13.8 x 8.4 x 11.1 cm. There are surrounding inflammatory changes. The right adnexa is unremarkable. Other: Small amount of free fluid in the pelvis. No pneumoperitoneum. Small fat containing umbilical hernia. Musculoskeletal: No acute or significant osseous findings. IMPRESSION: 1. Large multiloculated, fluid and air-filled left adnexal mass, measuring 13.8 x 8.4 x 11.1 cm, with surrounding inflammatory changes, concerning for tubo-ovarian abscess. 2. Interval increase in size of the uterus due to enlargement of a large posterior intramural fibroid. 3. New mild bilateral hydronephrosis and delayed renal function, likely secondary to bilateral distal ureteral obstruction due to pelvic pathology. 4. Hypodense filling defect within the IVC, concerning for thrombus. 5. Mild retroperitoneal and left external iliac lymphadenopathy is nonspecific, but may be reactive. These results were called by telephone at the time of interpretation on 11/15/2017 at 6:34 pm to Dr. Carmon Sails , who verbally acknowledged these results. Electronically Signed   By: Titus Dubin M.D.   On: 11/15/2017 18:36   Dg Abd 2 Views  Result Date: 11/15/2017 CLINICAL DATA:  Abdominal pain.  Fever.  Fibroids. EXAM: ABDOMEN - 2 VIEW COMPARISON:  None. FINDINGS: The bowel gas pattern is normal. There is no evidence of free air on the decubitus view. No radio-opaque calculi or other significant radiographic abnormality is seen. IMPRESSION: Negative. Electronically Signed   By: Staci Righter M.D.   On: 11/15/2017 16:41    Labs:  CBC: Recent Labs    10/03/17 1542  10/25/17 1018  11/13/17 1457 11/15/17  1110 11/15/17 1505 11/16/17 0806  WBC  --    < > 7.7   < > 24.3* 21.1* 24.8* 20.8*  HGB 8.8*  --  5.6 Repeated and verified X2.*  --   --   --  6.1* 7.3*  HCT 28.1*   < > 18.4 Repeated and verified X2.*   < > 23.9* 20.7* 20.6* 23.5*  PLT  --    < > 181.0   < > 246 291 306 264   < > = values in this interval not displayed.      COAGS: Recent Labs    10/02/17 2204 11/16/17 0046 11/16/17 0806  INR 1.97 1.78  --   APTT 46* 42* 47*    BMP: Recent Labs    08/02/17 1312 10/02/17 0814 11/15/17 1505 11/16/17 0046  NA 139 138 133* 137  K 3.8 3.5 3.4* 3.7  CL 106 105 95* 104  CO2 27 19* 25 25  GLUCOSE 93 132* 123* 122*  BUN 11 7 13 12   CALCIUM 9.0 8.9 8.5* 8.0*  CREATININE 0.75 0.90 0.99 1.08*  GFRNONAA >60 >60 >60 >60  GFRAA >60 >60 >60 >60    LIVER FUNCTION TESTS: Recent Labs    03/08/17 1602 08/02/17 1312 10/02/17 0814 11/15/17 1505  BILITOT 0.2 0.5 0.5 0.6  AST 10 15 14* 23  ALT 7 13* 10* 18  ALKPHOS 56 79 44 77  PROT 7.2 7.8 7.2 7.5  ALBUMIN 3.8 3.9 3.3* 2.2*    Assessment and Plan: Patient with history of symptomatic uterine fibroids, antiphospholipid antibody syndrome with prior history of multiple bilateral lower extremity DVTs, now on Xarelto.  Admitted now with abdominal pain, fever, chills, leukocytosis and imaging findings of left tubo-ovarian abscess with associated mild bilateral hydronephrosis as well as IVC thrombus, mild retroperitoneal and left external iliac lymphadenopathy.  Currently on IV heparin.  Request received for image guided drainage of left tubo-ovarian abscess.  Imaging studies have been reviewed by Dr. Laurence Ferrari.Risks and benefits discussed with the patient including bleeding, infection, damage to adjacent structures, bowel perforation/fistula connection, and sepsis.  All of the patient's questions were answered, patient is agreeable to proceed. Consent signed and in chart.  Procedure is tentatively scheduled for 3/23.   Electronically Signed: D. Rowe Robert, PA-C 11/16/2017, 1:38 PM   I spent a total of 30 minutes at the the patient's bedside AND on the patient's hospital floor or unit, greater than 50% of which was counseling/coordinating care for CT-guided drainage of left tubo-ovarian abscess    Patient ID: [name], female   DOB:  November 22, 1975, 42 y.o.   MRN: [id number]

## 2017-11-16 NOTE — Progress Notes (Addendum)
Initial Nutrition Assessment  DOCUMENTATION CODES:   Non-severe (moderate) malnutrition in context of chronic illness, Obesity unspecified  INTERVENTION:   - Once diet is advanced, RD to order Ensure Enlive po BID, each supplement provides 350 kcal and 20 grams of protein (pt prefers vanilla flavor)  Addendum: pt would also like to try Boost Breeze if she does not like Ensure Enlive.  NUTRITION DIAGNOSIS:   Moderate Malnutrition related to chronic illness(DVT with antiphospholipid) as evidenced by mild muscle depletion, percent weight loss(9.6% weight loss in 4 months).  GOAL:   Patient will meet greater than or equal to 90% of their needs  MONITOR:   Diet advancement, Labs, Weight trends  REASON FOR ASSESSMENT:   Malnutrition Screening Tool   ASSESSMENT:   42 year old female who presented to ED from [facility] for evaluation of fever. Pt with PMH significant for abnormal uterine bleeding due to fibroids s/p ablation, anemia, and DVT with anti-phospholipid. Pt admitted for treatment of IVC thrombosis and sepsis due to [facility].  Spoke with pat at bedside who reports having a poor appetite for the past 2 weeks. Pt states she has not been able to "keep much down" during this time due to nausea and vomiting. Pt has been able to tolerate fruit, yogurt, juice, sprite, Gatorade, and soup during this time.  Pt states that prior to these past 2 weeks, her appetite was "normal" and that she consumed 3 meals daily.  Pt reports her UBW as 223 lbs but that she has been losing wt. Pt states she last weighed 223 lbs "over a month ago" and that her weight has since decreased to 203 lbs.  Per weight history from previous doctor visits in chart, pt weighed 224.6 lb on 07/17/17 indicating a 21.6 lb weight loss in 4 months. This is a 9.6% weight loss which significant for timeframe.  Pt states her husband bought Ensure and that she was consuming 1 daily PTA. Pt would like to receive Ensure Enlive  once diet advanced.  Medications reviewed and include: 325 mg ferrous sulfate daily, 80 mg Megace BID, IV heparin, IV lactated ringers, IV antibiotics  Labs reviewed: creatinine 1.08, hemoglobin 7.3, HCT 23.5  NUTRITION - FOCUSED PHYSICAL EXAM:    Most Recent Value  Orbital Region  No depletion  Upper Arm Region  Mild depletion  Thoracic and Lumbar Region  Unable to assess  Buccal Region  No depletion  Temple Region  No depletion  Clavicle Bone Region  Mild depletion  Clavicle and Acromion Bone Region  Mild depletion  Scapular Bone Region  Unable to assess  Dorsal Hand  No depletion  Patellar Region  No depletion  Anterior Thigh Region  No depletion  Posterior Calf Region  No depletion  Edema (RD Assessment)  None  Hair  Reviewed  Eyes  Reviewed  Mouth  Reviewed  Skin  Reviewed  Nails  Reviewed       Diet Order:  Diet NPO time specified  EDUCATION NEEDS:   No education needs have been identified at this time  Skin:  Skin Assessment: Reviewed RN Assessment  Last BM:  11/14/17  Height:   Ht Readings from Last 1 Encounters:  11/16/17 5' 9"  (1.753 m)    Weight:   Wt Readings from Last 1 Encounters:  11/16/17 203 lb 7.8 oz (92.3 kg)    Ideal Body Weight:  65.9 kg  BMI:  Body mass index is 30.05 kg/m.  Estimated Nutritional Needs:   Kcal:  2200-2400 kcal/day  Protein:  80-95 grams/day  Fluid:  2.2-2.4 L/day    Gaynell Face, MS, RD, LDN Pager: [phone] Weekend/After Hours: [phone]

## 2017-11-16 NOTE — Progress Notes (Signed)
ANTICOAGULATION CONSULT NOTE  Pharmacy Consult for heparin Indication: IVC thrombosis  No Known Allergies  Patient Measurements: Height: 5' 9"  (175.3 cm) Weight: 203 lb 7.8 oz (92.3 kg) IBW/kg (Calculated) : 66.2 Heparin Dosing Weight: 85.6 kg  Vital Signs: Temp: 98.9 F (37.2 C) (03/22 2000) Temp Source: Oral (03/22 2000) BP: 138/74 (03/22 2000) Pulse Rate: 132 (03/22 2000)  Labs: Recent Labs    11/15/17 1505  11/16/17 0046 11/16/17 0806 11/16/17 1536 11/16/17 1837 11/16/17 2245  HGB 6.1*  --   --  7.3*  --  8.4*  --   HCT 20.6*  --   --  23.5*  --  27.3*  --   PLT 306  --   --  264  --  312  --   APTT  --    < > 42* 47* 41*  --  47*  LABPROT  --   --  20.5*  --   --   --   --   INR  --   --  1.78  --   --   --   --   HEPARINUNFRC  --   --  1.04* 0.77*  --   --   --   CREATININE 0.99  --  1.08*  --   --   --   --    < > = values in this interval not displayed.    Estimated Creatinine Clearance: 82.1 mL/min (A) (by C-G formula based on SCr of 1.08 mg/dL (H)).  Medications:  Medications Prior to Admission  Medication Sig Dispense Refill Last Dose  . acetaminophen (TYLENOL) 500 MG tablet Take 1,000 mg by mouth every 6 (six) hours as needed for fever.   11/14/2017 at Unknown time  . Ferrous Sulfate (IRON) 325 (65 Fe) MG TABS Take 1 tablet (325 mg total) by mouth daily. 30 each 11 11/14/2017 at Unknown time  . megestrol (MEGACE) 40 MG tablet Take 2 tablets (80 mg total) by mouth 2 (two) times daily. 120 tablet 5 11/14/2017 at Unknown time  . Multiple Vitamin (MULTIVITAMIN WITH MINERALS) TABS tablet Take 1 tablet by mouth daily.   Past Week at Unknown time  . traMADol (ULTRAM) 50 MG tablet Take 1 tablet (50 mg total) by mouth 3 (three) times daily as needed. (Patient taking differently: Take 50 mg by mouth 3 (three) times daily as needed for moderate pain. ) 30 tablet 0 11/15/2017 at Unknown time  . XARELTO 20 MG TABS tablet TAKE 1 TABLET (20 MG TOTAL) BY MOUTH DAILY WITH  SUPPER. 30 tablet 11 11/14/2017 at 0900 pm    Assessment: 42 yo on Xarelto PTA for antiphospholipid syndrome with multiple clots. 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 Xarelto 20 qd PTA LD 3/20 at 2100. Baseline aPTT 42, baseline HL 1.04. Baseline INR 1.79 (elevated 2nd xarelto). May need IVC filter.   Today, 11/16/2017:  Hg up to 7.3 after PRBC x 3; Plt wnl  APTT subtherapeutic and now lower despite rate increase. Per RN, line/pump/IV site appear normal  HL 0.77 (still affected by xarelto)  Patient normally has vaginal/uterine bleeding d/t fibroids, but reports her bleeding is actually less than at home, which is consistent with subtherapeutic anticoagulation  2245 aPtt=47 below goal, while confirming heparin rate with RN- realized heparin still running at 1400 units/hr (not increased earlier) no bleeding problems reported by RN   Goal of Therapy:  Heparin level 0.3-0.7 units/ml aPTT 66-102 seconds Monitor platelets by anticoagulation protocol: Yes  Plan:   1500 unit heparin bolus then increase drip to 1700 units/hr and check aPTT and HL in 6 hours  Daily HL and aPTT  Daily CBC (MD checking BID)   Dorrene German 11/16/2017, 11:49 PM

## 2017-11-16 NOTE — Progress Notes (Signed)
ANTICOAGULATION CONSULT NOTE  Pharmacy Consult for heparin Indication: IVC thrombosis  No Known Allergies  Patient Measurements: Height: 5' 9"  (175.3 cm) Weight: 203 lb 7.8 oz (92.3 kg) IBW/kg (Calculated) : 66.2 Heparin Dosing Weight: 85.6 kg  Vital Signs: Temp: 98.5 F (36.9 C) (03/22 0740) Temp Source: Oral (03/22 0740) BP: 108/63 (03/22 0700) Pulse Rate: 113 (03/22 0700)  Labs: Recent Labs    11/15/17 1110 11/15/17 1505 11/16/17 0046 11/16/17 0806  HGB  --  6.1*  --  7.3*  HCT 20.7* 20.6*  --  23.5*  PLT 291 306  --  264  APTT  --   --  42* 47*  LABPROT  --   --  20.5*  --   INR  --   --  1.78  --   HEPARINUNFRC  --   --  1.04* 0.77*  CREATININE  --  0.99 1.08*  --     Estimated Creatinine Clearance: 82.1 mL/min (A) (by C-G formula based on SCr of 1.08 mg/dL (H)).   Medical History: Past Medical History:  Diagnosis Date  . Anemia 09/2017   REQUIRING A TRANSFUSION  . Antiphospholipid syndrome ([place])   . Breast cyst    BILATERAL  . DVT (deep venous thrombosis) ([place])   . Dysfunctional uterine bleeding   . Fibroids   . Obesity     Medications:  Medications Prior to Admission  Medication Sig Dispense Refill Last Dose  . acetaminophen (TYLENOL) 500 MG tablet Take 1,000 mg by mouth every 6 (six) hours as needed for fever.   11/14/2017 at Unknown time  . Ferrous Sulfate (IRON) 325 (65 Fe) MG TABS Take 1 tablet (325 mg total) by mouth daily. 30 each 11 11/14/2017 at Unknown time  . megestrol (MEGACE) 40 MG tablet Take 2 tablets (80 mg total) by mouth 2 (two) times daily. 120 tablet 5 11/14/2017 at Unknown time  . Multiple Vitamin (MULTIVITAMIN WITH MINERALS) TABS tablet Take 1 tablet by mouth daily.   Past Week at Unknown time  . traMADol (ULTRAM) 50 MG tablet Take 1 tablet (50 mg total) by mouth 3 (three) times daily as needed. (Patient taking differently: Take 50 mg by mouth 3 (three) times daily as needed for moderate pain. ) 30 tablet 0 11/15/2017 at Unknown time   . XARELTO 20 MG TABS tablet TAKE 1 TABLET (20 MG TOTAL) BY MOUTH DAILY WITH SUPPER. 30 tablet 11 11/14/2017 at 0900 pm    Assessment: 42 yo on Xarelto PTA for antiphospholipid syndrome with multiple clots.  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  Xarelto 20 qd PTA LD 3/20 at 2100 Baseline aPTT 42, baseline HL 1.04. Baseline INR 1.79 (elevated 2nd xarelto).  May need IVC filter.   11/16/2017  Hg 6.1>>2 U PRBC>>7.3, PLTC WNL APTT below goal at 47 seconds 6 hrs after heparin 2000 unit bolus and drip at 1200 units/hr. HL 0.77 (affected by xarelto).  Pt having vaginal bleeding 2nd uterine fibroids.  Goal of Therapy:  Heparin level 0.3-0.7 units/ml aPTT 66-102 seconds Monitor platelets by anticoagulation protocol: Yes   Plan:  2500 unit heparin bolus then increase drip to 1400 units/hr and check aPTT in 6 hours Daily HL and aPTT/HL   Eudelia Bunch, Pharm.D. [phone] 11/16/2017 8:55 AM

## 2017-11-16 NOTE — Consult Note (Signed)
ED Consult    Reason for Consult:  IVC thrombus Referring Physician:  [facility] ED MRN #:  [id number]  History of Present Illness: This is a 42 y.o. female with history of anti phospholipid followed by hematology with plan for lifelong anticoagulation. She has at least 3 previous dvt with recent fairly extensive acute on chronic dvt in January. She denies any leg swelling. She presents with abdominal pain and has large TOA. Heparin to be started in ED.   Past Medical History:  Diagnosis Date  . Anemia 09/2017   REQUIRING A TRANSFUSION  . Antiphospholipid syndrome ([place])   . Breast cyst    BILATERAL  . DVT (deep venous thrombosis) ([place])   . Dysfunctional uterine bleeding   . Fibroids   . Obesity     Past Surgical History:  Procedure Laterality Date  . CESAREAN SECTION    . DILITATION & CURRETTAGE/HYSTROSCOPY WITH HYDROTHERMAL ABLATION N/A 10/08/2014   Procedure: DILATATION & CURETTAGE/HYSTEROSCOPY WITH HYDROTHERMAL ABLATION;  Surgeon: Osborne Oman, MD;  Location: [facility];  Service: Gynecology;  Laterality: N/A;  . IR RADIOLOGIST EVAL & MGMT  10/10/2017  . MYOMECTOMY      No Known Allergies  Prior to Admission medications   Medication Sig Start Date End Date Taking? Authorizing Provider  acetaminophen (TYLENOL) 500 MG tablet Take 1,000 mg by mouth every 6 (six) hours as needed for fever.   Yes [provider]  Ferrous Sulfate (IRON) 325 (65 Fe) MG TABS Take 1 tablet (325 mg total) by mouth daily. 10/22/17  Yes Venia Carbon, MD  megestrol (MEGACE) 40 MG tablet Take 2 tablets (80 mg total) by mouth 2 (two) times daily. 10/08/17  Yes Anyanwu, Sallyanne Havers, MD  Multiple Vitamin (MULTIVITAMIN WITH MINERALS) TABS tablet Take 1 tablet by mouth daily.   Yes [provider]  traMADol (ULTRAM) 50 MG tablet Take 1 tablet (50 mg total) by mouth 3 (three) times daily as needed. Patient taking differently: Take 50 mg by mouth 3 (three) times daily as needed for moderate pain.   11/12/17  Yes Venia Carbon, MD  XARELTO 20 MG TABS tablet TAKE 1 TABLET (20 MG TOTAL) BY MOUTH DAILY WITH SUPPER. 09/13/17  Yes Venia Carbon, MD    Social History   Socioeconomic History  . Marital status: Married    Spouse name: Not on file  . Number of children: 1  . Years of education: Not on file  . Highest education level: Not on file  Occupational History  . 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 Financial resource strain: Not on file  . Food insecurity:    Worry: Not on file    Inability: Not on file  . Transportation needs:    Medical: Not on file    Non-medical: Not on file  Tobacco Use  . Smoking status: Never Smoker  . Smokeless tobacco: Never Used  Substance and Sexual Activity  . Alcohol use: Yes    Comment: rare  . Drug use: No  . Sexual activity: Yes    Partners: Male    Birth control/protection: None  Lifestyle  . Physical activity:    Days per week: Not on file    Minutes per session: Not on file  . Stress: Not on file  Relationships  . Social connections:    Talks on phone: Not on file    Gets together: Not on file    Attends  religious service: Not on file    Active member of club or organization: Not on file    Attends meetings of clubs or organizations: Not on file    Relationship status: Not on file  . Intimate partner violence:    Fear of current or ex partner: Not on file    Emotionally abused: Not on file    Physically abused: Not on file    Forced sexual activity: Not on file  Other Topics Concern  . Not on file  Social History Narrative  . Not on file    Family History  Problem Relation Age of Onset  . Diabetes Paternal Grandmother   . Heart disease Neg Hx   . Hypertension Neg Hx   . Cancer Neg Hx        breast or colon cancer    ROS: [x]  Positive   [ ]  Negative   [ ]  All sytems reviewed and are negative  Cardiovascular: []  chest pain/pressure []  palpitations []  SOB lying  flat []  DOE []  pain in legs while walking []  pain in legs at rest []  pain in legs at night []  non-healing ulcers [x]  hx of DVT []  swelling in legs  Pulmonary: []  productive cough []  asthma/wheezing []  home O2  Neurologic: []  weakness in []  arms []  legs []  numbness in []  arms []  legs []  hx of CVA []  mini stroke [] difficulty speaking or slurred speech []  temporary loss of vision in one eye []  dizziness  Hematologic: []  hx of cancer [x]  bleeding problems [x]  problems with blood clotting easily  Endocrine:   []  diabetes []  thyroid disease  GI []  vomiting blood []  blood in stool  GU: []  CKD/renal failure []  HD--[]  M/W/F or []  T/T/S []  burning with urination []  blood in urine  Psychiatric: []  anxiety []  depression  Musculoskeletal: []  arthritis []  joint pain  Integumentary: []  rashes []  ulcers  Constitutional: []  fever []  chills   Physical Examination  Vitals:   11/15/17 2230 11/15/17 2300  BP: 109/61 127/71  Pulse: (!) 131 (!) 125  Resp: (!) 21 (!) 22  Temp:    SpO2: 100% 100%   There is no height or weight on file to calculate BMI.  General:  WDWN in NAD Gait: Not observed HENT: WNL, normocephalic Pulmonary: normal non-labored breathing Cardiac:rrr Abdomen: diffusely ttp, mild distension Extremities: no lower extremity swelling Neurologic: A&O X 3; SENSATION: normal; MOTOR FUNCTION:  moving all extremities equally. Speech is fluent/normal Psychiatric: apporpriate mood and affect  CBC    Component Value Date/Time   WBC 24.8 (H) 11/15/2017 1505   RBC 2.48 (L) 11/15/2017 1505   HGB 6.1 (LL) 11/15/2017 1505   HGB 9.3 (L) 07/17/2017 1606   HCT 20.6 (L) 11/15/2017 1505   HCT 30.3 (L) 07/17/2017 1606   PLT 306 11/15/2017 1505   PLT 291 11/15/2017 1110   PLT 256 07/17/2017 1606   MCV 83.1 11/15/2017 1505   MCV 70.4 (L) 07/17/2017 1606   MCH 24.6 (L) 11/15/2017 1505   MCHC 29.6 (L) 11/15/2017 1505   RDW 19.5 (H) 11/15/2017 1505   RDW  20.9 (H) 07/17/2017 1606   LYMPHSABS 2.2 11/15/2017 1505   LYMPHSABS 2.0 07/17/2017 1606   MONOABS 3.5 (H) 11/15/2017 1505   MONOABS 0.5 07/17/2017 1606   EOSABS 0.0 11/15/2017 1505   EOSABS 0.1 07/17/2017 1606   BASOSABS 0.0 11/15/2017 1505   BASOSABS 0.0 07/17/2017 1606    BMET    Component Value Date/Time  NA 133 (L) 11/15/2017 1505   K 3.4 (L) 11/15/2017 1505   CL 95 (L) 11/15/2017 1505   CO2 25 11/15/2017 1505   GLUCOSE 123 (H) 11/15/2017 1505   BUN 13 11/15/2017 1505   CREATININE 0.99 11/15/2017 1505   CALCIUM 8.5 (L) 11/15/2017 1505   GFRNONAA >60 11/15/2017 1505   GFRAA >60 11/15/2017 1505    COAGS: Lab Results  Component Value Date   INR 1.97 10/02/2017   INR 4.0 (H) 04/23/2007   INR 3.3 (H) 04/22/2007     Non-Invasive Vascular Imaging:   IMPRESSION: 1. Large multiloculated, fluid and air-filled left adnexal mass, measuring 13.8 x 8.4 x 11.1 cm, with surrounding inflammatory changes, concerning for tubo-ovarian abscess. 2. Interval increase in size of the uterus due to enlargement of a large posterior intramural fibroid. 3. New mild bilateral hydronephrosis and delayed renal function, likely secondary to bilateral distal ureteral obstruction due to pelvic pathology. 4. Hypodense filling defect within the IVC, concerning for thrombus. 5. Mild retroperitoneal and left external iliac lymphadenopathy is nonspecific, but may be reactive.   ASSESSMENT/PLAN: This is a 42 y.o. female here with TOA and has been planned for uterine artery embolization for fibroids . The IVC filling defect certainly appears concerning and merits attention. Agree with heparin drip at this time and long term anticoagulation can be discussed with hematology. Will order IVC duplex to further evaluate but unlikely patient can tolerate unless pain significantly improves. If she needs to have any procedures and has to hold heparin she should be considered for IVC filter that could be placed  by either [facility]VS or IR given that they also know this patient well.   Adonus Uselman C. Donzetta Matters, MD [facility] Office: [phone] Pager: [phone]

## 2017-11-17 ENCOUNTER — Inpatient Hospital Stay (HOSPITAL_COMMUNITY): Payer: BC Managed Care – PPO

## 2017-11-17 ENCOUNTER — Other Ambulatory Visit: Payer: Self-pay

## 2017-11-17 LAB — URINE CULTURE

## 2017-11-17 LAB — CBC
HCT: 26.2 % — ABNORMAL LOW (ref 36.0–46.0)
HEMATOCRIT: 23.5 % — AB (ref 36.0–46.0)
HEMOGLOBIN: 7.4 g/dL — AB (ref 12.0–15.0)
Hemoglobin: 8.1 g/dL — ABNORMAL LOW (ref 12.0–15.0)
MCH: 26.1 pg (ref 26.0–34.0)
MCH: 26.3 pg (ref 26.0–34.0)
MCHC: 31.5 g/dL (ref 30.0–36.0)
MCHC: 30.9 g/dL (ref 30.0–36.0)
MCV: 83 fL (ref 78.0–100.0)
MCV: 85.1 fL (ref 78.0–100.0)
PLATELETS: 336 10*3/uL (ref 150–400)
Platelets: 295 10*3/uL (ref 150–400)
RBC: 2.83 MIL/uL — ABNORMAL LOW (ref 3.87–5.11)
RBC: 3.08 MIL/uL — ABNORMAL LOW (ref 3.87–5.11)
RDW: 18 % — ABNORMAL HIGH (ref 11.5–15.5)
RDW: 17.8 % — AB (ref 11.5–15.5)
WBC: 22.7 10*3/uL — ABNORMAL HIGH (ref 4.0–10.5)
WBC: 23.9 10*3/uL — ABNORMAL HIGH (ref 4.0–10.5)

## 2017-11-17 LAB — BASIC METABOLIC PANEL
Anion gap: 10 (ref 5–15)
BUN: 8 mg/dL (ref 6–20)
CALCIUM: 8.1 mg/dL — AB (ref 8.9–10.3)
CO2: 23 mmol/L (ref 22–32)
Chloride: 104 mmol/L (ref 101–111)
Creatinine, Ser: 0.75 mg/dL (ref 0.44–1.00)
GFR calc Af Amer: >60 mL/min (ref >60)
GLUCOSE: 113 mg/dL — AB (ref 65–99)
POTASSIUM: 3.1 mmol/L — AB (ref 3.5–5.1)
Sodium: 137 mmol/L (ref 135–145)

## 2017-11-17 LAB — HEPARIN LEVEL (UNFRACTIONATED)
Heparin Unfractionated: 0.32 IU/mL (ref 0.30–0.70)
Heparin Unfractionated: 0.26 [IU]/mL — ABNORMAL LOW (ref 0.30–0.70)

## 2017-11-17 LAB — APTT
APTT: 59 s — AB (ref 24–36)
aPTT: 66 seconds — ABNORMAL HIGH (ref 24–36)

## 2017-11-17 MED ORDER — SODIUM CHLORIDE 0.9% FLUSH
5.0000 mL | Freq: Three times a day (TID) | INTRAVENOUS | Status: DC
  Administered 2017-11-17 – 2017-11-22 (×13): 5 mL

## 2017-11-17 MED ORDER — HEPARIN (PORCINE) IN NACL 100-0.45 UNIT/ML-% IJ SOLN
1950.0000 [IU]/h | INTRAMUSCULAR | Status: DC
  Administered 2017-11-17: 1950 [IU]/h via INTRAVENOUS
  Filled 2017-11-17 (×2): qty 250

## 2017-11-17 MED ORDER — SODIUM CHLORIDE 0.9 % IV SOLN
Freq: Once | INTRAVENOUS | Status: AC
  Administered 2017-11-17: 15:00:00 via INTRAVENOUS

## 2017-11-17 MED ORDER — FENTANYL CITRATE (PF) 100 MCG/2ML IJ SOLN
INTRAMUSCULAR | Status: AC | PRN
  Administered 2017-11-17: 25 ug via INTRAVENOUS
  Administered 2017-11-17: 50 ug via INTRAVENOUS
  Administered 2017-11-17 (×2): 25 ug via INTRAVENOUS

## 2017-11-17 MED ORDER — MIDAZOLAM HCL 2 MG/2ML IJ SOLN
INTRAMUSCULAR | Status: AC | PRN
  Administered 2017-11-17 (×2): 0.5 mg via INTRAVENOUS
  Administered 2017-11-17: 1 mg via INTRAVENOUS
  Administered 2017-11-17: 0.5 mg via INTRAVENOUS

## 2017-11-17 MED ORDER — FLUMAZENIL 0.5 MG/5ML IV SOLN
INTRAVENOUS | Status: AC
  Filled 2017-11-17: qty 5

## 2017-11-17 MED ORDER — HEPARIN (PORCINE) IN NACL 100-0.45 UNIT/ML-% IJ SOLN
1800.0000 [IU]/h | INTRAMUSCULAR | Status: DC
  Administered 2017-11-17: 1800 [IU]/h via INTRAVENOUS
  Filled 2017-11-17: qty 250

## 2017-11-17 MED ORDER — FENTANYL CITRATE (PF) 100 MCG/2ML IJ SOLN
INTRAMUSCULAR | Status: AC
  Filled 2017-11-17: qty 4

## 2017-11-17 MED ORDER — MIDAZOLAM HCL 2 MG/2ML IJ SOLN
INTRAMUSCULAR | Status: AC
  Filled 2017-11-17: qty 4

## 2017-11-17 MED ORDER — IBUPROFEN 200 MG PO TABS
400.0000 mg | ORAL_TABLET | Freq: Four times a day (QID) | ORAL | Status: DC | PRN
  Administered 2017-11-17 – 2017-11-20 (×7): 400 mg via ORAL
  Filled 2017-11-17 (×8): qty 2

## 2017-11-17 MED ORDER — POTASSIUM CHLORIDE 10 MEQ/100ML IV SOLN
10.0000 meq | INTRAVENOUS | Status: AC
Start: 2017-11-17 — End: 2017-11-17
  Administered 2017-11-17 (×3): 10 meq via INTRAVENOUS
  Filled 2017-11-17 (×3): qty 100

## 2017-11-17 MED ORDER — NALOXONE HCL 0.4 MG/ML IJ SOLN
INTRAMUSCULAR | Status: AC
  Filled 2017-11-17: qty 1

## 2017-11-17 NOTE — Sedation Documentation (Signed)
Patient denies pain and is resting comfortably.  

## 2017-11-17 NOTE — Progress Notes (Signed)
PROGRESS NOTE [facility]   KIRI HINDERLITER   WUJ:[id number] DOB: 1975-11-14  DOA: 11/15/2017 PCP: Venia Carbon, MD   Brief Narrative:  [name] is a 42 year old female with medical history significant for DUB associated with fibroids, antiphospholipid syndrome with multiple DVTs and anemia resented to the emergency department complaining of abdominal pain, fever and chills.  Upon ED evaluation patient was found to be febrile, with severe abdominal pain and CT of the abdomen/Pelvis shows large multiloculated abscess measuring 13.8 x 8.4 x 11.1 cm with surrounding inflammatory concerning for tubo-ovarian abscess, mild bilateral hydronephrosis likely secondary to distal ureteral obstruction due to pelvic pathology.  Hypodense filling defect within the IVC concerning for thrombus.  Mild retroperitoneal and left external iliac lymphadenopathy.  WBC elevated at 24.8, hemoglobin 6.1 and potassium 3.4.  UA grossly abnormal.  Patient was admitted with working diagnosis of sepsis secondary to tubo-ovarian abscess and IVC thrombus complicated with symptomatic anemia.  Patient was started on broad-spectrum antibiotics and IV heparin.  Patient was also transfused 2 units of PRBCs.  Subjective: Patient seen and examined, continues to complain of pain.  Going for draining of TOA.  No acute events overnight remains afebrile WBC improving   Assessment & Plan: Sepsis due to tubo-ovarian abscess Large TOA measuring 13.8 x 8.4 x 11.1 cm on the left Sepsis physiology resolved Continue Zosyn for at least 48 hours Obtain CT of the abdomen and pelvis 2-3 days post draining to assure resolution of TOA Blood ulcers negative up-to-date Continue supportive treatment  IVC thrombosis Patient with history of antiphospholipid syndrome and recurrent DVTs CT abdomen and pelvis concerning of IVC thrombosis Continue heparin drip for now Will discuss case with vascular surgery for next step in  management. She might be candidate for IVC filter  Symptomatic acute anemia due chronic bleed - DUB Status post 3 units of PRBCs, H&H 7.4 today, patient asymptomatic.  She continues to have vaginal bleed.  Will transfuse another unit today.  IV Venofer in a.m.  Continue to monitor H&H closely  DUB from fibrosis CT abdomen and pelvis shows enlargement in size of posterior intramural fibroid Patient has previously failed endometrial ablation Was planned for embolization next week Per GYN Continue Megace  Hypokalemia Replete Check BMP and mag in the morning  Sinus tachycardia Chart review shows that patient has been in sinus tachycardia for long time No significant workup at this moment.  Patient is asymptomatic.  May be related to blood loss, will monitor after blood transfusion.  If heart rate increase at home for 130 will add low-dose metoprolol.   DVT prophylaxis: Heparin drip Code Status: Full code Family Communication: None at bedside Disposition Plan: Transfer to [place]  Consultants:    OB/GYN  Vascular surgery  Procedures:   None  Antimicrobials: Anti-infectives (From admission, onward)   Start     Dose/Rate Route Frequency Ordered Stop   11/16/17 1600  vancomycin (VANCOCIN) 1,500 mg in sodium chloride 0.9 % 500 mL IVPB  Status:  Discontinued     1,500 mg 250 mL/hr over 120 Minutes Intravenous Every 24 hours 11/16/17 0125 11/16/17 0849   11/15/17 2359  piperacillin-tazobactam (ZOSYN) IVPB 3.375 g     3.375 g 12.5 mL/hr over 240 Minutes Intravenous Every 8 hours 11/15/17 2325     11/15/17 1500  piperacillin-tazobactam (ZOSYN) IVPB 3.375 g     3.375 g 100 mL/hr over 30 Minutes Intravenous  Once 11/15/17 1450 11/15/17 1553   11/15/17 1500  vancomycin (  VANCOCIN) IVPB 1000 mg/200 mL premix  Status:  Discontinued     1,000 mg 200 mL/hr over 60 Minutes Intravenous  Once 11/15/17 1450 11/15/17 1452   11/15/17 1500  vancomycin (VANCOCIN) 2,000 mg in sodium chloride  0.9 % 500 mL IVPB     2,000 mg 250 mL/hr over 120 Minutes Intravenous  Once 11/15/17 1452 11/15/17 1808          Objective: Vitals:   11/17/17 1030 11/17/17 1100 11/17/17 1147 11/17/17 1200  BP: (!) 121/34 126/82 135/81 136/88  Pulse: (!) 123 (!) 120  (!) 120  Resp: (!) 24 (!) 25 (!) 35 (!) 23  Temp:  99.2 F (37.3 C)    TempSrc:  Oral    SpO2: 99% 100%  99%  Weight:      Height:        Intake/Output Summary (Last 24 hours) at 11/17/2017 1419 Last data filed at 11/17/2017 1400 Gross per 24 hour  Intake 2925.9 ml  Output 55 ml  Net 2870.9 ml   Filed Weights   11/16/17 0031  Weight: 92.3 kg (203 lb 7.8 oz)    Examination:  General: Pt is alert, awake, not in acute distress Cardiovascular: Tachycardia, S1/S2 +, no rubs, no gallops Respiratory: CTA bilaterally, no wheezing, no rhonchi Abdominal: Soft, mild distended, right lower quadrant tenderness Extremities: no edema   Data Reviewed: I have personally reviewed following labs and imaging studies  CBC: Recent Labs  Lab 11/13/17 1457 11/15/17 1110 11/15/17 1505 11/16/17 0806 11/16/17 1837 11/17/17 0821  WBC 24.3* 21.1* 24.8* 20.8* 24.1* 22.7*  NEUTROABS 19.9* 16.5* 19.1* 17.3*  --   --   HGB  --   --  6.1* 7.3* 8.4* 7.4*  HCT 23.9* 20.7* 20.6* 23.5* 27.3* 23.5*  MCV 83.0 79.5 83.1 83.0 83.0 83.0  PLT 246 291 306 264 312 300   Basic Metabolic Panel: Recent Labs  Lab 11/15/17 1505 11/16/17 0046 11/17/17 0821  NA 133* 137 137  K 3.4* 3.7 3.1*  CL 95* 104 104  CO2 25 25 23   GLUCOSE 123* 122* 113*  BUN 13 12 8   CREATININE 0.99 1.08* 0.75  CALCIUM 8.5* 8.0* 8.1*   GFR: Estimated Creatinine Clearance: 110.8 mL/min (by C-G formula based on SCr of 0.75 mg/dL). Liver Function Tests: Recent Labs  Lab 11/15/17 1505  AST 23  ALT 18  ALKPHOS 77  BILITOT 0.6  PROT 7.5  ALBUMIN 2.2*   Recent Labs  Lab 11/15/17 1505  LIPASE 14   No results for input(s): AMMONIA in the last 168  hours. Coagulation Profile: Recent Labs  Lab 11/16/17 0046  INR 1.78   Cardiac Enzymes: No results for input(s): CKTOTAL, CKMB, CKMBINDEX, TROPONINI in the last 168 hours. BNP (last 3 results) No results for input(s): PROBNP in the last 8760 hours. HbA1C: No results for input(s): HGBA1C in the last 72 hours. CBG: No results for input(s): GLUCAP in the last 168 hours. Lipid Profile: No results for input(s): CHOL, HDL, LDLCALC, TRIG, CHOLHDL, LDLDIRECT in the last 72 hours. Thyroid Function Tests: No results for input(s): TSH, T4TOTAL, FREET4, T3FREE, THYROIDAB in the last 72 hours. Anemia Panel: Recent Labs    11/15/17 1110  FERRITIN 273*  TIBC 260  IRON 16*   Sepsis Labs: Recent Labs  Lab 11/15/17 1522 11/16/17 0046  PROCALCITON  --  46.61  LATICACIDVEN 1.42  --     Recent Results (from the past 240 hour(s))  Blood Culture (routine x 2)  Status: None (Preliminary result)   Collection Time: 11/15/17  3:06 PM  Result Value Ref Range Status   Specimen Description   Final    BLOOD RIGHT ANTECUBITAL Performed at [facility] [address] [place], [place] [address]    Special Requests   Final    BOTTLES DRAWN AEROBIC AND ANAEROBIC Blood Culture adequate volume Performed at [facility] [address], [place], [place] [address]    Culture   Final    NO GROWTH 2 DAYS Performed at [facility] [address], [place], [place] [address]    Report Status PENDING  Incomplete  Blood Culture (routine x 2)     Status: None (Preliminary result)   Collection Time: 11/15/17  3:06 PM  Result Value Ref Range Status   Specimen Description   Final    BLOOD LEFT ANTECUBITAL Performed at [facility] [address] [place], [place] [address]    Special Requests   Final    BOTTLES DRAWN AEROBIC AND ANAEROBIC Blood Culture adequate volume Performed at [facility] [address]  [place], [place] [address]    Culture   Final    NO GROWTH 2 DAYS Performed at [facility] [address], [place], [place] [address]    Report Status PENDING  Incomplete  Urine culture     Status: Abnormal   Collection Time: 11/15/17  5:38 PM  Result Value Ref Range Status   Specimen Description   Final    URINE, RANDOM Performed at [facility] [address] [place], [place] [address]    Special Requests   Final    NONE Performed at [facility], [place] [address] [place], [place] [address]    Culture MULTIPLE SPECIES PRESENT, SUGGEST RECOLLECTION (A)  Final   Report Status 11/17/2017 FINAL  Final  MRSA PCR Screening     Status: None   Collection Time: 11/16/17 12:28 AM  Result Value Ref Range Status   MRSA by PCR NEGATIVE NEGATIVE Final    Comment:        The GeneXpert MRSA Assay (FDA approved for NASAL specimens only), is one component of a comprehensive MRSA colonization surveillance program. It is not intended to diagnose MRSA infection nor to guide or monitor treatment for MRSA infections. Performed at [facility], [place] [address] [place], [place] [address]       Radiology Studies: Dg Chest 2 View  Result Date: 11/15/2017 CLINICAL DATA:  Low-grade fever, tachycardia, leukocytosis EXAM: CHEST - 2 VIEW COMPARISON:  CT chest 04/08/2008 FINDINGS: Normal heart size, mediastinal contours, and pulmonary vascularity. Lungs clear. No pulmonary infiltrate, pleural effusion or pneumothorax. Bones unremarkable. IMPRESSION: No acute abnormalities. Electronically Signed   By: Lavonia Dana M.D.   On: 11/15/2017 16:43   Ct Abdomen Pelvis W Contrast  Result Date: 11/15/2017 CLINICAL DATA:  Fever, tachycardia, and leukocytosis. History of uterine fibroids. EXAM: CT ABDOMEN AND PELVIS WITH CONTRAST TECHNIQUE: Multidetector CT imaging of the abdomen and pelvis was performed using the standard protocol following bolus  administration of intravenous contrast. CONTRAST:  100 cc ISOVUE-300 IOPAMIDOL (ISOVUE-300) INJECTION 61% COMPARISON:  CT abdomen pelvis dated August 02, 2017. FINDINGS: Lower chest: No acute abnormality. Hepatobiliary: No focal liver abnormality. The gallbladder is distended without wall thickening, gallstones, or biliary dilatation. Pancreas: Unremarkable. No pancreatic ductal dilatation or surrounding inflammatory changes. Spleen: Normal in size without focal abnormality. Adrenals/Urinary  Tract: The adrenal glands are unremarkable. Delayed enhancement and excretion of contrast from the bilateral kidneys. Mild bilateral hydronephrosis. No focal renal lesion. No renal or ureteral calculi. Mild anterior and rightward displacement of the bladder, which is otherwise unremarkable in appearance. Stomach/Bowel: Stomach is within normal limits. Appendix is not definitely visualized, however there are no secondary inflammatory changes at the base of the cecum. No evidence of bowel wall thickening, distention, or inflammatory changes. Vascular/Lymphatic: Hypodense filling defect within the IVC. Multiple enlarged left external iliac lymph nodes measuring up to 13 mm in short axis. Multiple mildly enlarged retroperitoneal lymph nodes measuring up to 12 mm in short axis. Reproductive: Interval increase in size of the uterus, now measuring up to 11.2 x 17.5 x 18.4 cm, previously 11.0 x 13.2 x 15.5 cm, with interval increase in size of the large posterior intramural fibroid. Interval increase in size of the multiloculated, fluid and air-filled left adnexal mass, measuring 13.8 x 8.4 x 11.1 cm. There are surrounding inflammatory changes. The right adnexa is unremarkable. Other: Small amount of free fluid in the pelvis. No pneumoperitoneum. Small fat containing umbilical hernia. Musculoskeletal: No acute or significant osseous findings. IMPRESSION: 1. Large multiloculated, fluid and air-filled left adnexal mass, measuring 13.8 x  8.4 x 11.1 cm, with surrounding inflammatory changes, concerning for tubo-ovarian abscess. 2. Interval increase in size of the uterus due to enlargement of a large posterior intramural fibroid. 3. New mild bilateral hydronephrosis and delayed renal function, likely secondary to bilateral distal ureteral obstruction due to pelvic pathology. 4. Hypodense filling defect within the IVC, concerning for thrombus. 5. Mild retroperitoneal and left external iliac lymphadenopathy is nonspecific, but may be reactive. These results were called by telephone at the time of interpretation on 11/15/2017 at 6:34 pm to Dr. Carmon Sails , who verbally acknowledged these results. Electronically Signed   By: Titus Dubin M.D.   On: 11/15/2017 18:36   Dg Abd 2 Views  Result Date: 11/15/2017 CLINICAL DATA:  Abdominal pain.  Fever.  Fibroids. EXAM: ABDOMEN - 2 VIEW COMPARISON:  None. FINDINGS: The bowel gas pattern is normal. There is no evidence of free air on the decubitus view. No radio-opaque calculi or other significant radiographic abnormality is seen. IMPRESSION: Negative. Electronically Signed   By: Staci Righter M.D.   On: 11/15/2017 16:41   Ct Image Guided Drainage By Percutaneous Catheter  Result Date: 11/17/2017 INDICATION: 42 year old with a large complex pelvic fluid collection which is concerning for a tubo-ovarian abscess. Patient has an IVC thrombus and currently anticoagulated. EXAM: CT GUIDED DRAINAGE OF PELVIC ABSCESS MEDICATIONS: The patient is currently admitted to the hospital and receiving intravenous antibiotics. The antibiotics were administered within an appropriate time frame prior to the initiation of the procedure. ANESTHESIA/SEDATION: 1.5 mg IV Versed 125 mcg IV Fentanyl Moderate Sedation Time: 30 minutes The patient was continuously monitored during the procedure by the interventional radiology nurse under my direct supervision. COMPLICATIONS: None immediate. TECHNIQUE: Informed written consent  was obtained from the patient after a thorough discussion of the procedural risks, benefits and alternatives. All questions were addressed. Specifically, patient was aware of the bleeding risk. Due to patient's IVC thrombus, we elected to perform the procedure with the patient on IV heparin. Timeout was performed prior to the initiation of the procedure. PROCEDURE: Patient was placed prone on the CT scanner. Images of the pelvis were obtained. The left gluteal region was prepped with chlorhexidine and a sterile field was created. Skin and soft tissues were  anesthetized with 1% lidocaine. Using CT guidance, an 18 gauge trocar needle was directed into the pelvic fluid collection from a left transgluteal approach. Purulent bloody fluid was aspirated. A stiff Amplatz wire was advanced into the pelvic collection. Tract was dilated to accommodate a 10 [place] multipurpose drain. Approximately 50 mL of bloody purulent fluid was removed. Specimen was sent for culture. Catheter was sutured to skin. FINDINGS: Again noted are complex pelvic fluid collections compatible with a tubo-ovarian abscess. The caudal aspect of the collection contains fluid. The bloody purulent fluid was removed from this collection. However, there is a more cephalad component containing air-fluid levels. Initially, no air was able to be aspirated. IMPRESSION: CT-guided placement of a drainage catheter within the pelvic fluid collection. Bloody purulent fluid was removed and findings are compatible with a tubo-ovarian abscess. However, the pelvic fluid collection appears to be complex with air-fluid collections in the more cephalad component. Patient will need follow up imaging to see if this drain is able to decompress these other collections. Electronically Signed   By: Markus Daft M.D.   On: 11/17/2017 11:26      Scheduled Meds: . fentaNYL      . ferrous sulfate  325 mg Oral Reed  . megestrol  80 mg Oral BID  . midazolam      . sodium  chloride flush  3 mL Intravenous Q12H  . sodium chloride flush  5 mL Intracatheter Q8H   Continuous Infusions: . heparin 1,800 Units/hr (11/17/17 1118)  . lactated ringers 100 mL/hr at 11/17/17 3817  . piperacillin-tazobactam Stopped (11/17/17 1147)  . potassium chloride 10 mEq (11/17/17 1400)     LOS: 2 days    Time spent: Total of 35 minutes spent with pt, greater than 50% of which was spent in discussion of  treatment, counseling and coordination of care   Chipper Oman, MD Pager: Text Page via [email or web address]   If 7PM-7AM, please contact night-coverage [email or web address] 11/17/2017, 2:19 PM   Note - This record has been created using [employer]. Chart creation errors have been sought, but may not always have been located. Such creation errors do not reflect on the standard of medical care.

## 2017-11-17 NOTE — Progress Notes (Signed)
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 Pt on Xarelto PTA. Patient underwent CT guided placement of drainage catheter in pelvic abscess today. Reported that heparin infusion was not stopped at any time due to high risk of clot.    1956 aPTT = 59 seconds, HL = 0.26 units/mL, both subtherapeutic on IV heparin infusion at 1800 units/hr (increased from 1700 units/hr earlier today)  CBC: Hgb 7.4 > 8.1 s/p 1 unit PRBCs, Pltc WNL  Some bloody output from drain (IR MD aware per RN)   Plan:  Heparin level and aPTT now correlating, so will adjust heparin infusion using heparin levels moving forward  Increase heparin infusion to1950 units/hr  Heparin level 6 hours after rate change  Daily CBC and heparin level while on heparin infusion  Monitor closely for s/sx of bleeding   Lindell Spar, PharmD, BCPS Pager: [phone] 11/17/2017 9:36 PM

## 2017-11-17 NOTE — Progress Notes (Signed)
ANTICOAGULATION CONSULT NOTE  Pharmacy Consult for heparin Indication: IVC thrombosis  No Known Allergies  Patient Measurements: Height: 5' 9"  (175.3 cm) Weight: 203 lb 7.8 oz (92.3 kg) IBW/kg (Calculated) : 66.2 Heparin Dosing Weight: 85.6 kg  Vital Signs: Temp: 98.9 F (37.2 C) (03/23 0745) Temp Source: Oral (03/23 0745) BP: 134/83 (03/23 0800) Pulse Rate: 120 (03/23 0800)  Labs: Recent Labs    11/15/17 1505  11/16/17 0046 11/16/17 0806 11/16/17 1536 11/16/17 1837 11/16/17 2245 11/17/17 0821  HGB 6.1*  --   --  7.3*  --  8.4*  --  7.4*  HCT 20.6*  --   --  23.5*  --  27.3*  --  23.5*  PLT 306  --   --  264  --  312  --  295  APTT  --    < > 42* 47* 41*  --  47* 66*  LABPROT  --   --  20.5*  --   --   --   --   --   INR  --   --  1.78  --   --   --   --   --   HEPARINUNFRC  --   --  1.04* 0.77*  --   --   --  0.32  CREATININE 0.99  --  1.08*  --   --   --   --   --    < > = values in this interval not displayed.    Estimated Creatinine Clearance: 82.1 mL/min (A) (by C-G formula based on SCr of 1.08 mg/dL (H)).  Medications:  Medications Prior to Admission  Medication Sig Dispense Refill Last Dose  . acetaminophen (TYLENOL) 500 MG tablet Take 1,000 mg by mouth every 6 (six) hours as needed for fever.   11/14/2017 at Unknown time  . Ferrous Sulfate (IRON) 325 (65 Fe) MG TABS Take 1 tablet (325 mg total) by mouth daily. 30 each 11 11/14/2017 at Unknown time  . megestrol (MEGACE) 40 MG tablet Take 2 tablets (80 mg total) by mouth 2 (two) times daily. 120 tablet 5 11/14/2017 at Unknown time  . Multiple Vitamin (MULTIVITAMIN WITH MINERALS) TABS tablet Take 1 tablet by mouth daily.   Past Week at Unknown time  . traMADol (ULTRAM) 50 MG tablet Take 1 tablet (50 mg total) by mouth 3 (three) times daily as needed. (Patient taking differently: Take 50 mg by mouth 3 (three) times daily as needed for moderate pain. ) 30 tablet 0 11/15/2017 at Unknown time  . XARELTO 20 MG TABS  tablet TAKE 1 TABLET (20 MG TOTAL) BY MOUTH DAILY WITH SUPPER. 30 tablet 11 11/14/2017 at 0900 pm    Assessment: 42 yo on Xarelto PTA for antiphospholipid syndrome with multiple clots. 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 Xarelto 20 qd PTA LD 3/20 at 2100. Baseline aPTT 42, baseline HL 1.04. Baseline INR 1.79 (elevated 2nd xarelto). May need IVC filter.   Today, 11/17/2017:  Hg  7.4 after PRBC x 3; Plt wnl  APTT 66 sec - at low end of goal after 1500 unit bolus and rate incr to 1700 units/hr  HL 0.32 (may be correlating with aPTT now that xarelto out of system)  Patient normally has vaginal/uterine bleeding d/t fibroids, but reports her bleeding is actually less than at home  IR drainage tentatively scheduled for today  Goal of Therapy:  Heparin level 0.3-0.7 units/ml aPTT 66-102 seconds Monitor platelets by anticoagulation protocol: Yes  Plan:   Increase drip to 1800 units/hr and check aPTT and HL in 6 hours  Daily HL and aPTT  Daily CBC (MD checking BID)  F/u for IR procedure for interruption to heparin infusion  Eudelia Bunch, Pharm.D. [phone] 11/17/2017 9:03 AM

## 2017-11-17 NOTE — Procedures (Signed)
  Pre-operative Diagnosis: Tubo-ovarian abscess       Post-operative Diagnosis: Tubo-ovarian abscess   Indications: Tubo-ovarian abscess with IVC thrombus and not good surgical candidate   Procedure: CT guided placement of drainage catheter in pelvic abscess  Findings: 10 Fr drain placed via left transgluteal approach.  50 ml of bloody purulent fluid removed.  No significant bleeding or hematoma formation.  Complications: None     EBL: Minimal  Plan: Send fluid for culture.  Follow drain output and will need follow-up CT in few days to ensure that drain is adequately decompressing the complex TOA.    Follow CBC to look for signs of bleeding post procedure.

## 2017-11-18 LAB — CBC WITH DIFFERENTIAL/PLATELET
BASOS PCT: 0 %
Basophils Absolute: 0 10*3/uL (ref 0.0–0.1)
EOS ABS: 0.4 10*3/uL (ref 0.0–0.7)
EOS PCT: 2 %
HEMATOCRIT: 26.8 % — AB (ref 36.0–46.0)
Hemoglobin: 8.1 g/dL — ABNORMAL LOW (ref 12.0–15.0)
LYMPHS ABS: 2.5 10*3/uL (ref 0.7–4.0)
Lymphocytes Relative: 13 %
MCH: 25.6 pg — AB (ref 26.0–34.0)
MCHC: 30.2 g/dL (ref 30.0–36.0)
MCV: 84.8 fL (ref 78.0–100.0)
MONO ABS: 0.8 10*3/uL (ref 0.1–1.0)
Monocytes Relative: 4 %
NEUTROS ABS: 15.2 10*3/uL — AB (ref 1.7–7.7)
Neutrophils Relative %: 81 %
PLATELETS: 309 10*3/uL (ref 150–400)
RBC: 3.16 MIL/uL — ABNORMAL LOW (ref 3.87–5.11)
RDW: 17.6 % — AB (ref 11.5–15.5)
WBC: 18.9 10*3/uL — ABNORMAL HIGH (ref 4.0–10.5)

## 2017-11-18 LAB — BASIC METABOLIC PANEL
Anion gap: 11 (ref 5–15)
BUN: 9 mg/dL (ref 6–20)
CALCIUM: 7.8 mg/dL — AB (ref 8.9–10.3)
CHLORIDE: 105 mmol/L (ref 101–111)
CO2: 22 mmol/L (ref 22–32)
CREATININE: 0.69 mg/dL (ref 0.44–1.00)
GFR calc Af Amer: >60 mL/min (ref >60)
GFR calc non Af Amer: >60 mL/min (ref >60)
GLUCOSE: 109 mg/dL — AB (ref 65–99)
Potassium: 3.1 mmol/L — ABNORMAL LOW (ref 3.5–5.1)
Sodium: 138 mmol/L (ref 135–145)

## 2017-11-18 LAB — PREPARE RBC (CROSSMATCH)

## 2017-11-18 LAB — HEPARIN LEVEL (UNFRACTIONATED)
HEPARIN UNFRACTIONATED: 0.16 [IU]/mL — AB (ref 0.30–0.70)
HEPARIN UNFRACTIONATED: 0.42 [IU]/mL (ref 0.30–0.70)
Heparin Unfractionated: 0.16 IU/mL — ABNORMAL LOW (ref 0.30–0.70)

## 2017-11-18 LAB — MAGNESIUM: Magnesium: 1.7 mg/dL (ref 1.7–2.4)

## 2017-11-18 MED ORDER — SODIUM CHLORIDE 0.9 % IV SOLN
125.0000 mg | Freq: Once | INTRAVENOUS | Status: AC
  Administered 2017-11-18: 125 mg via INTRAVENOUS
  Filled 2017-11-18: qty 10

## 2017-11-18 MED ORDER — MAGNESIUM SULFATE 2 GM/50ML IV SOLN
2.0000 g | Freq: Once | INTRAVENOUS | Status: AC
  Administered 2017-11-18: 2 g via INTRAVENOUS
  Filled 2017-11-18: qty 50

## 2017-11-18 MED ORDER — HEPARIN (PORCINE) IN NACL 100-0.45 UNIT/ML-% IJ SOLN
2450.0000 [IU]/h | INTRAMUSCULAR | Status: DC
  Administered 2017-11-18 (×2): 2450 [IU]/h via INTRAVENOUS
  Filled 2017-11-18 (×2): qty 250

## 2017-11-18 MED ORDER — HEPARIN BOLUS VIA INFUSION
2000.0000 [IU] | Freq: Once | INTRAVENOUS | Status: AC
  Administered 2017-11-18: 2000 [IU] via INTRAVENOUS
  Filled 2017-11-18: qty 2000

## 2017-11-18 MED ORDER — OXYCODONE-ACETAMINOPHEN 5-325 MG PO TABS
1.0000 | ORAL_TABLET | Freq: Four times a day (QID) | ORAL | Status: DC | PRN
  Administered 2017-11-18: 1 via ORAL
  Administered 2017-11-19: 2 via ORAL
  Administered 2017-11-19: 1 via ORAL
  Administered 2017-11-19: 2 via ORAL
  Administered 2017-11-19: 1 via ORAL
  Administered 2017-11-20 – 2017-11-23 (×7): 2 via ORAL
  Filled 2017-11-18: qty 2
  Filled 2017-11-18 (×2): qty 1
  Filled 2017-11-18 (×5): qty 2
  Filled 2017-11-18 (×2): qty 1
  Filled 2017-11-18 (×3): qty 2

## 2017-11-18 MED ORDER — HEPARIN (PORCINE) IN NACL 100-0.45 UNIT/ML-% IJ SOLN
2200.0000 [IU]/h | INTRAMUSCULAR | Status: DC
  Administered 2017-11-18: 2200 [IU]/h via INTRAVENOUS
  Filled 2017-11-18 (×2): qty 250

## 2017-11-18 MED ORDER — HEPARIN BOLUS VIA INFUSION
2500.0000 [IU] | Freq: Once | INTRAVENOUS | Status: AC
  Administered 2017-11-18: 2500 [IU] via INTRAVENOUS
  Filled 2017-11-18: qty 2500

## 2017-11-18 MED ORDER — POTASSIUM CHLORIDE 10 MEQ/100ML IV SOLN
10.0000 meq | INTRAVENOUS | Status: AC
  Administered 2017-11-18 (×3): 10 meq via INTRAVENOUS
  Filled 2017-11-18 (×3): qty 100

## 2017-11-18 NOTE — Progress Notes (Signed)
PROGRESS NOTE [facility]   [name]   JWJ:[id number] DOB: 07/15/1976  DOA: 11/15/2017 PCP: Venia Carbon, MD   Brief Narrative:  [name] is a 42 year old female with medical history significant for DUB associated with fibroids, antiphospholipid syndrome with multiple DVTs and anemia resented to the emergency department complaining of abdominal pain, fever and chills.  Upon ED evaluation patient was found to be febrile, with severe abdominal pain and CT of the abdomen/Pelvis shows large multiloculated abscess measuring 13.8 x 8.4 x 11.1 cm with surrounding inflammatory concerning for tubo-ovarian abscess, mild bilateral hydronephrosis likely secondary to distal ureteral obstruction due to pelvic pathology.  Hypodense filling defect within the IVC concerning for thrombus.  Mild retroperitoneal and left external iliac lymphadenopathy.  WBC elevated at 24.8, hemoglobin 6.1 and potassium 3.4.  UA grossly abnormal.  Patient was admitted with working diagnosis of sepsis secondary to tubo-ovarian abscess and IVC thrombus complicated with symptomatic anemia.  Patient was started on broad-spectrum antibiotics and IV heparin.  Patient was also transfused 2 units of PRBCs.  Subjective: Patient seen and examined, doing much better.  She is a status post CT-guided placement of drainage for TOA.  No acute events overnight.  Remains afebrile.  Assessment & Plan: Sepsis due to tubo-ovarian abscess Large TOA measuring 13.8 x 8.4 x 11.1 cm on the left Sepsis physiology resolved We will continue Zosyn for today contrast to doxy and Flagyl in a.m. 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  Continue heparin for now will discuss with hematology anticoagulation options  Symptomatic acute anemia due chronic bleed - DUB Has been transfused 4 units of PRBC during hospital stay Vaginal bleed is minimal. 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 Patient is asymptomatic.  Felt to be related to hypovolemia, after blood transfusion this has improved.  Continue to monitor for now  DVT prophylaxis: Heparin drip Code Status: Full code Family Communication: None at bedside Disposition Plan: Home when IVC thrombosis and infectious process under control  Consultants:    OB/GYN  Vascular surgery  Procedures:   None  Antimicrobials: Anti-infectives (From admission, onward)   Start     Dose/Rate Route Frequency Ordered Stop   11/16/17 1600  vancomycin (VANCOCIN) 1,500 mg in sodium chloride 0.9 % 500 mL IVPB  Status:  Discontinued     1,500 mg 250 mL/hr over 120 Minutes Intravenous Every 24 hours 11/16/17 0125 11/16/17 0849   11/15/17 2359  piperacillin-tazobactam (ZOSYN) IVPB 3.375 g     3.375 g 12.5 mL/hr over 240 Minutes Intravenous Every 8 hours 11/15/17 2325     11/15/17 1500  piperacillin-tazobactam (ZOSYN) IVPB 3.375 g     3.375 g 100 mL/hr over 30 Minutes Intravenous  Once 11/15/17 1450 11/15/17  1553   11/15/17 1500  vancomycin (VANCOCIN) IVPB 1000 mg/200 mL premix  Status:  Discontinued     1,000 mg 200 mL/hr over 60 Minutes Intravenous   Once 11/15/17 1450 11/15/17 1452   11/15/17 1500  vancomycin (VANCOCIN) 2,000 mg in sodium chloride 0.9 % 500 mL IVPB     2,000 mg 250 mL/hr over 120 Minutes Intravenous  Once 11/15/17 1452 11/15/17 1808          Objective: Vitals:   11/17/17 2000 11/17/17 2309 11/18/17 0050 11/18/17 0500  BP: (!) 135/91  125/81 130/86  Pulse: (!) 114  99 100  Resp: (!) 28  18 20   Temp:  98.3 F (36.8 C) 97.9 F (36.6 C) 98.2 F (36.8 C)  TempSrc:  Oral Oral Oral  SpO2: 98%  99% 100%  Weight:      Height:        Intake/Output Summary (Last 24 hours) at 11/18/2017 1405 Last data filed at 11/18/2017 0100 Gross per 24 hour  Intake 2349.38 ml  Output 30 ml  Net 2319.38 ml   Filed Weights   11/16/17 0031  Weight: 92.3 kg (203 lb 7.8 oz)    Examination:  General: Pt is alert, awake, not in acute distress Cardiovascular: RRR, S1/S2 +, no rubs, no gallops Respiratory: CTA bilaterally, no wheezing, no rhonchi Abdominal: Soft, NT, ND, bowel sounds + Back: Drain in place Extremities: no edema, no cyanosis   Data Reviewed: I have personally reviewed following labs and imaging studies  CBC: Recent Labs  Lab 11/13/17 1457 11/15/17 1110  11/15/17 1505 11/16/17 0806 11/16/17 1837 11/17/17 0821 11/17/17 1956 11/18/17 0521  WBC 24.3* 21.1*   < > 24.8* 20.8* 24.1* 22.7* 23.9* 18.9*  NEUTROABS 19.9* 16.5*  --  19.1* 17.3*  --   --   --  15.2*  HGB  --   --    < > 6.1* 7.3* 8.4* 7.4* 8.1* 8.1*  HCT 23.9* 20.7*  --  20.6* 23.5* 27.3* 23.5* 26.2* 26.8*  MCV 83.0 79.5  --  83.1 83.0 83.0 83.0 85.1 84.8  PLT 246 291   < > 306 264 312 295 336 309   < > = values in this interval not displayed.   Basic Metabolic Panel: Recent Labs  Lab 11/15/17 1505 11/16/17 0046 11/17/17 0821 11/18/17 0521  NA 133* 137 137 138  K 3.4* 3.7 3.1* 3.1*  CL 95* 104 104 105  CO2 25 25 23 22   GLUCOSE 123* 122* 113* 109*  BUN 13 12 8 9   CREATININE 0.99 1.08* 0.75 0.69  CALCIUM 8.5* 8.0* 8.1* 7.8*  MG   --   --   --  1.7   GFR: Estimated Creatinine Clearance: 110.8 mL/min (by C-G formula based on SCr of 0.69 mg/dL). Liver Function Tests: Recent Labs  Lab 11/15/17 1505  AST 23  ALT 18  ALKPHOS 77  BILITOT 0.6  PROT 7.5  ALBUMIN 2.2*   Recent Labs  Lab 11/15/17 1505  LIPASE 14   No results for input(s): AMMONIA in the last 168 hours. Coagulation Profile: Recent Labs  Lab 11/16/17 0046  INR 1.78   Cardiac Enzymes: No results for input(s): CKTOTAL, CKMB, CKMBINDEX, TROPONINI in the last 168 hours. BNP (last 3 results) No results for input(s): PROBNP in the last 8760 hours. HbA1C: No results for input(s): HGBA1C in the last 72 hours. CBG: No results for input(s): GLUCAP in the last 168 hours. Lipid Profile: No results for input(s): CHOL,  HDL, LDLCALC, TRIG, CHOLHDL, LDLDIRECT in the last 72 hours. Thyroid Function Tests: No results for input(s): TSH, T4TOTAL, FREET4, T3FREE, THYROIDAB in the last 72 hours. Anemia Panel: No results for input(s): VITAMINB12, FOLATE, FERRITIN, TIBC, IRON, RETICCTPCT in the last 72 hours. Sepsis Labs: Recent Labs  Lab 11/15/17 1522 11/16/17 0046  PROCALCITON  --  46.61  LATICACIDVEN 1.42  --     Recent Results (from the past 240 hour(s))  Blood Culture (routine x 2)     Status: None (Preliminary result)   Collection Time: 11/15/17  3:06 PM  Result Value Ref Range Status   Specimen Description   Final    BLOOD RIGHT ANTECUBITAL Performed at [facility] [address], [place], [place] [address]    Special Requests   Final    BOTTLES DRAWN AEROBIC AND ANAEROBIC Blood Culture adequate volume Performed at [facility] [address] [place], [place] [address]    Culture   Final    NO GROWTH 2 DAYS Performed at [facility] [address], [place], [place] [address]    Report Status PENDING  Incomplete  Blood Culture (routine x 2)     Status: None (Preliminary result)   Collection  Time: 11/15/17  3:06 PM  Result Value Ref Range Status   Specimen Description   Final    BLOOD LEFT ANTECUBITAL Performed at [facility] [address] [place], [place] [address]    Special Requests   Final    BOTTLES DRAWN AEROBIC AND ANAEROBIC Blood Culture adequate volume Performed at [facility] [address], [place], [place] [address]    Culture   Final    NO GROWTH 2 DAYS Performed at [facility] [address] [place], [place] [address]    Report Status PENDING  Incomplete  Urine culture     Status: Abnormal   Collection Time: 11/15/17  5:38 PM  Result Value Ref Range Status   Specimen Description   Final    URINE, RANDOM Performed at [facility] [address], [place], [place] [address]    Special Requests   Final    NONE Performed at [facility], [place] [address] [place], [place] [address]    Culture MULTIPLE SPECIES PRESENT, SUGGEST RECOLLECTION (A)  Final   Report Status 11/17/2017 FINAL  Final  MRSA PCR Screening     Status: None   Collection Time: 11/16/17 12:28 AM  Result Value Ref Range Status   MRSA by PCR NEGATIVE NEGATIVE Final    Comment:        The GeneXpert MRSA Assay (FDA approved for NASAL specimens only), is one component of a comprehensive MRSA colonization surveillance program. It is not intended to diagnose MRSA infection nor to guide or monitor treatment for MRSA infections. Performed at [facility], [place] [address], [place], [place] [address]   Aerobic/Anaerobic Culture (surgical/deep wound)     Status: None (Preliminary result)   Collection Time: 11/17/17 10:39 AM  Result Value Ref Range Status   Specimen Description   Final    ABSCESS PELVIS Performed at [facility], [place] [address], [place], [place] [address]    Special Requests   Final    NONE Performed at [facility], [place] [address],  [place],  [address]    Gram Stain   Final    ABUNDANT WBC PRESENT, PREDOMINANTLY PMN  ABUNDANT GRAM POSITIVE COCCI IN PAIRS    Culture   Final    NO GROWTH < 24 HOURS Performed at [facility], [place] [address], [place], [place] [address]    Report Status PENDING  Incomplete      Radiology Studies: Ct Image Guided Drainage By Percutaneous Catheter  Result Date: 11/17/2017 INDICATION: 42 year old with a large complex pelvic fluid collection which is concerning for a tubo-ovarian abscess. Patient has an IVC thrombus and currently anticoagulated. EXAM: CT GUIDED DRAINAGE OF PELVIC ABSCESS MEDICATIONS: The patient is currently admitted to the hospital and receiving intravenous antibiotics. The antibiotics were administered within an appropriate time frame prior to the initiation of the procedure. ANESTHESIA/SEDATION: 1.5 mg IV Versed 125 mcg IV Fentanyl Moderate Sedation Time: 30 minutes The patient was continuously monitored during the procedure by the interventional radiology nurse under my direct supervision. COMPLICATIONS: None immediate. TECHNIQUE: Informed written consent was obtained from the patient after a thorough discussion of the procedural risks, benefits and alternatives. All questions were addressed. Specifically, patient was aware of the bleeding risk. Due to patient's IVC thrombus, we elected to perform the procedure with the patient on IV heparin. Timeout was performed prior to the initiation of the procedure. PROCEDURE: Patient was placed prone on the CT scanner. Images of the pelvis were obtained. The left gluteal region was prepped with chlorhexidine and a sterile field was created. Skin and soft tissues were anesthetized with 1% lidocaine. Using CT guidance, an 18 gauge trocar needle was directed into the pelvic fluid collection from a left transgluteal approach. Purulent bloody fluid was aspirated. A stiff Amplatz wire was advanced into the pelvic collection. Tract was dilated  to accommodate a 10 [place] multipurpose drain. Approximately 50 mL of bloody purulent fluid was removed. Specimen was sent for culture. Catheter was sutured to skin. FINDINGS: Again noted are complex pelvic fluid collections compatible with a tubo-ovarian abscess. The caudal aspect of the collection contains fluid. The bloody purulent fluid was removed from this collection. However, there is a more cephalad component containing air-fluid levels. Initially, no air was able to be aspirated. IMPRESSION: CT-guided placement of a drainage catheter within the pelvic fluid collection. Bloody purulent fluid was removed and findings are compatible with a tubo-ovarian abscess. However, the pelvic fluid collection appears to be complex with air-fluid collections in the more cephalad component. Patient will need follow up imaging to see if this drain is able to decompress these other collections. Electronically Signed   By: Markus Daft M.D.   On: 11/17/2017 11:26      Scheduled Meds: . ferrous sulfate  325 mg Oral Daily  . megestrol  80 mg Oral BID  . sodium chloride flush  3 mL Intravenous Q12H  . sodium chloride flush  5 mL Intracatheter Q8H   Continuous Infusions: . heparin 2,200 Units/hr (11/18/17 1035)  . lactated ringers 100 mL/hr at 11/18/17 0537  . piperacillin-tazobactam 3.375 g (11/18/17 0817)     LOS: 3 days    Time spent: Total of 25 minutes spent with pt, greater than 50% of which was spent in discussion of  treatment, counseling and coordination of care   Chipper Oman, MD Pager: Text Page via [email or web address]   If 7PM-7AM, please contact night-coverage [email or web address] 11/18/2017, 2:05 PM   Note - This record has been created using [employer]. Chart creation errors have been sought, but may not always have been located. Such creation errors do not reflect on the standard of medical  care.

## 2017-11-18 NOTE — Progress Notes (Signed)
ANTICOAGULATION CONSULT NOTE  Pharmacy Consult for heparin Indication: IVC thrombosis  No Known Allergies  Patient Measurements: Height: 5' 9"  (175.3 cm) Weight: 203 lb 7.8 oz (92.3 kg) IBW/kg (Calculated) : 66.2 Heparin Dosing Weight: 85.6 kg  Vital Signs: Temp: 98.2 F (36.8 C) (03/24 0500) Temp Source: Oral (03/24 0500) BP: 130/86 (03/24 0500) Pulse Rate: 100 (03/24 0500)  Labs: Recent Labs    11/16/17 0046  11/16/17 1837 11/16/17 2245 11/17/17 0821 11/17/17 1956 11/18/17 0521  HGB  --    < > 8.4*  --  7.4* 8.1*  --   HCT  --    < > 27.3*  --  23.5* 26.2*  --   PLT  --    < > 312  --  295 336  --   APTT 42*   < >  --  47* 66* 59*  --   LABPROT 20.5*  --   --   --   --   --   --   INR 1.78  --   --   --   --   --   --   HEPARINUNFRC 1.04*   < >  --   --  0.32 0.26* 0.16*  CREATININE 1.08*  --   --   --  0.75  --  0.69   < > = values in this interval not displayed.    Estimated Creatinine Clearance: 110.8 mL/min (by C-G formula based on SCr of 0.69 mg/dL).  Medications:  Medications Prior to Admission  Medication Sig Dispense Refill Last Dose  . acetaminophen (TYLENOL) 500 MG tablet Take 1,000 mg by mouth every 6 (six) hours as needed for fever.   11/14/2017 at Unknown time  . Ferrous Sulfate (IRON) 325 (65 Fe) MG TABS Take 1 tablet (325 mg total) by mouth daily. 30 each 11 11/14/2017 at Unknown time  . megestrol (MEGACE) 40 MG tablet Take 2 tablets (80 mg total) by mouth 2 (two) times daily. 120 tablet 5 11/14/2017 at Unknown time  . Multiple Vitamin (MULTIVITAMIN WITH MINERALS) TABS tablet Take 1 tablet by mouth daily.   Past Week at Unknown time  . traMADol (ULTRAM) 50 MG tablet Take 1 tablet (50 mg total) by mouth 3 (three) times daily as needed. (Patient taking differently: Take 50 mg by mouth 3 (three) times daily as needed for moderate pain. ) 30 tablet 0 11/15/2017 at Unknown time  . XARELTO 20 MG TABS tablet TAKE 1 TABLET (20 MG TOTAL) BY MOUTH DAILY WITH  SUPPER. 30 tablet 11 11/14/2017 at 0900 pm    Assessment: 42 yo on Xarelto PTA for antiphospholipid syndrome with multiple clots. 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 Xarelto 20 qd PTA LD 3/20 at 2100. Baseline aPTT 42, baseline HL 1.04. Baseline INR 1.79 (elevated 2nd xarelto). May need IVC filter.   3/23  Hg  7.4 after PRBC x 3; Plt wnl  APTT 66 sec - at low end of goal after 1500 unit bolus and rate incr to 1700 units/hr  HL 0.32 (may be correlating with aPTT now that xarelto out of system)  Patient normally has vaginal/uterine bleeding d/t fibroids, but reports her bleeding is actually less than at home  IR drainage tentatively scheduled for today Today, 3/24  0521 HL = 0.16 below goal, no infusion or bleeding issues per RN. CBC pending  Goal of Therapy:  Heparin level 0.3-0.7 units/ml aPTT 66-102 seconds Monitor platelets by anticoagulation protocol: Yes   Plan:  Give 2000 unit bolus x1 now  Increase drip to 2200 units/hr  Daily HL   Daily CBC (MD checking BID)   Dorrene German 11/18/2017 6:12 AM

## 2017-11-18 NOTE — Progress Notes (Signed)
ANTICOAGULATION CONSULT NOTE  Pharmacy Consult for heparin Indication: IVC thrombosis  No Known Allergies  Patient Measurements: Height: 5' 9"  (175.3 cm) Weight: 203 lb 7.8 oz (92.3 kg) IBW/kg (Calculated) : 66.2 Heparin Dosing Weight: 85.6 kg  Vital Signs: Temp: 98.2 F (36.8 C) (03/24 1420) Temp Source: Oral (03/24 1420) BP: 129/90 (03/24 1420) Pulse Rate: 107 (03/24 1420)  Labs: Recent Labs    11/16/17 0046  11/16/17 2245 11/17/17 0821 11/17/17 1956 11/18/17 0521 11/18/17 1340  HGB  --    < >  --  7.4* 8.1* 8.1*  --   HCT  --    < >  --  23.5* 26.2* 26.8*  --   PLT  --    < >  --  295 336 309  --   APTT 42*   < > 47* 66* 59*  --   --   LABPROT 20.5*  --   --   --   --   --   --   INR 1.78  --   --   --   --   --   --   HEPARINUNFRC 1.04*   < >  --  0.32 0.26* 0.16* 0.16*  CREATININE 1.08*  --   --  0.75  --  0.69  --    < > = values in this interval not displayed.    Estimated Creatinine Clearance: 110.8 mL/min (by C-G formula based on SCr of 0.69 mg/dL).  Medications:  Medications Prior to Admission  Medication Sig Dispense Refill Last Dose  . acetaminophen (TYLENOL) 500 MG tablet Take 1,000 mg by mouth every 6 (six) hours as needed for fever.   11/14/2017 at Unknown time  . Ferrous Sulfate (IRON) 325 (65 Fe) MG TABS Take 1 tablet (325 mg total) by mouth daily. 30 each 11 11/14/2017 at Unknown time  . megestrol (MEGACE) 40 MG tablet Take 2 tablets (80 mg total) by mouth 2 (two) times daily. 120 tablet 5 11/14/2017 at Unknown time  . Multiple Vitamin (MULTIVITAMIN WITH MINERALS) TABS tablet Take 1 tablet by mouth daily.   Past Week at Unknown time  . traMADol (ULTRAM) 50 MG tablet Take 1 tablet (50 mg total) by mouth 3 (three) times daily as needed. (Patient taking differently: Take 50 mg by mouth 3 (three) times daily as needed for moderate pain. ) 30 tablet 0 11/15/2017 at Unknown time  . XARELTO 20 MG TABS tablet TAKE 1 TABLET (20 MG TOTAL) BY MOUTH DAILY WITH  SUPPER. 30 tablet 11 11/14/2017 at 0900 pm    Assessment: 42 yo on Xarelto PTA for antiphospholipid syndrome with multiple clots. 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 Xarelto 20 qd PTA LD 3/20 at 2100. Baseline aPTT 42, baseline HL 1.04. Baseline INR 1.79 (elevated 2nd xarelto). May need IVC filter.   3/23  Hg  7.4 after PRBC x 3; Plt wnl  APTT 66 sec - at low end of goal after 1500 unit bolus and rate incr to 1700 units/hr  HL 0.32 (may be correlating with aPTT now that xarelto out of system)  Patient normally has vaginal/uterine bleeding d/t fibroids, but reports her bleeding is actually less than at home  IR drainage tentatively scheduled for today Today, 3/24  0521 HL = 0.16 below goal, no infusion or bleeding issues per RN.    1340 HL is 0.16, subtherapeutic  Hgb 8.1 low but stable, plt 309   No bleeding or line issues  Goal of Therapy:  Heparin level 0.3-0.7 units/ml aPTT 66-102 seconds Monitor platelets by anticoagulation protocol: Yes   Plan:   Give 2500 unit bolus x1 now  Increase drip to 2450 units/hr  Daily HL   Daily CBC (MD checking BID)  Per MD notes, plan to speak to hematology regarding anticoagulation options. Follow up recommendations    Royetta Asal, PharmD, BCPS Pager [phone] 11/18/2017 3:38 PM

## 2017-11-19 ENCOUNTER — Inpatient Hospital Stay (HOSPITAL_COMMUNITY): Payer: BC Managed Care – PPO

## 2017-11-19 ENCOUNTER — Telehealth: Payer: Self-pay | Admitting: Radiology

## 2017-11-19 DIAGNOSIS — E440 Moderate protein-calorie malnutrition: Secondary | ICD-10-CM

## 2017-11-19 DIAGNOSIS — I82220 Acute embolism and thrombosis of inferior vena cava: Secondary | ICD-10-CM

## 2017-11-19 LAB — TYPE AND SCREEN
ABO/RH(D): B POS
ANTIBODY SCREEN: POSITIVE
DONOR AG TYPE: NEGATIVE
DONOR AG TYPE: NEGATIVE
Donor AG Type: NEGATIVE
Donor AG Type: NEGATIVE
Donor AG Type: NEGATIVE
UNIT DIVISION: 0
UNIT DIVISION: 0
Unit division: 0
Unit division: 0
Unit division: 0

## 2017-11-19 LAB — BASIC METABOLIC PANEL
ANION GAP: 7 (ref 5–15)
BUN: 6 mg/dL (ref 6–20)
CO2: 23 mmol/L (ref 22–32)
Calcium: 7.7 mg/dL — ABNORMAL LOW (ref 8.9–10.3)
Chloride: 107 mmol/L (ref 101–111)
Creatinine, Ser: 0.64 mg/dL (ref 0.44–1.00)
GFR calc Af Amer: >60 mL/min (ref >60)
GLUCOSE: 99 mg/dL (ref 65–99)
POTASSIUM: 3.3 mmol/L — AB (ref 3.5–5.1)
Sodium: 137 mmol/L (ref 135–145)

## 2017-11-19 LAB — BPAM RBC
BLOOD PRODUCT EXPIRATION DATE: 201904042359
BLOOD PRODUCT EXPIRATION DATE: 201904062359
BLOOD PRODUCT EXPIRATION DATE: 201904082359
Blood Product Expiration Date: 201904072359
Blood Product Expiration Date: 201904092359
ISSUE DATE / TIME: 201903231444
ISSUE DATE / TIME: 201903211848
ISSUE DATE / TIME: 201903220123
ISSUE DATE / TIME: 201903221104
UNIT TYPE AND RH: 7300
UNIT TYPE AND RH: 7300
UNIT TYPE AND RH: 7300
Unit Type and Rh: 7300
Unit Type and Rh: 7300

## 2017-11-19 LAB — CBC
HCT: 26.2 % — ABNORMAL LOW (ref 36.0–46.0)
Hemoglobin: 7.9 g/dL — ABNORMAL LOW (ref 12.0–15.0)
MCH: 25.8 pg — AB (ref 26.0–34.0)
MCHC: 30.2 g/dL (ref 30.0–36.0)
MCV: 85.6 fL (ref 78.0–100.0)
PLATELETS: 373 10*3/uL (ref 150–400)
RBC: 3.06 MIL/uL — AB (ref 3.87–5.11)
RDW: 18.1 % — ABNORMAL HIGH (ref 11.5–15.5)
WBC: 15 10*3/uL — AB (ref 4.0–10.5)

## 2017-11-19 LAB — MAGNESIUM: Magnesium: 1.6 mg/dL — ABNORMAL LOW (ref 1.7–2.4)

## 2017-11-19 LAB — HEPARIN LEVEL (UNFRACTIONATED): Heparin Unfractionated: 0.4 IU/mL (ref 0.30–0.70)

## 2017-11-19 MED ORDER — SENNOSIDES-DOCUSATE SODIUM 8.6-50 MG PO TABS: 1.0000 | ORAL_TABLET | Freq: Every evening | ORAL | Status: DC | PRN

## 2017-11-19 MED ORDER — DOXYCYCLINE HYCLATE 100 MG PO TABS
100.0000 mg | ORAL_TABLET | Freq: Two times a day (BID) | ORAL | Status: DC
  Administered 2017-11-19 – 2017-11-23 (×9): 100 mg via ORAL
  Filled 2017-11-19 (×9): qty 1

## 2017-11-19 MED ORDER — METRONIDAZOLE 500 MG PO TABS
500.0000 mg | ORAL_TABLET | Freq: Three times a day (TID) | ORAL | Status: DC
  Administered 2017-11-19 – 2017-11-23 (×12): 500 mg via ORAL
  Filled 2017-11-19 (×12): qty 1

## 2017-11-19 MED ORDER — POLYETHYLENE GLYCOL 3350 17 G PO PACK
17.0000 g | PACK | Freq: Every day | ORAL | Status: DC
  Administered 2017-11-20: 17 g via ORAL
  Filled 2017-11-19 (×2): qty 1

## 2017-11-19 MED ORDER — HEPARIN (PORCINE) IN NACL 100-0.45 UNIT/ML-% IJ SOLN
2500.0000 [IU]/h | INTRAMUSCULAR | Status: DC
  Administered 2017-11-19 – 2017-11-23 (×9): 2500 [IU]/h via INTRAVENOUS
  Filled 2017-11-19 (×13): qty 250

## 2017-11-19 NOTE — Telephone Encounter (Signed)
Called and left message on the cell phone to call [facility] to schedule f/u appointment in 3 weeks.

## 2017-11-19 NOTE — Progress Notes (Signed)
ANTICOAGULATION CONSULT NOTE  Pharmacy Consult for heparin Indication: IVC thrombosis  No Known Allergies  Patient Measurements: Height: 5' 9"  (175.3 cm) Weight: 203 lb 7.8 oz (92.3 kg) IBW/kg (Calculated) : 66.2 Heparin Dosing Weight: 85.6 kg  Vital Signs: Temp: 98 F (36.7 C) (03/25 0540) Temp Source: Oral (03/25 0540) BP: 119/86 (03/25 0540) Pulse Rate: 95 (03/25 0540)  Labs: Recent Labs    11/16/17 2245  11/17/17 0821 11/17/17 1956 11/18/17 0521 11/18/17 1340 11/18/17 2238 11/19/17 0539 11/19/17 0850  HGB  --   --  7.4* 8.1* 8.1*  --   --  7.9*  --   HCT  --   --  23.5* 26.2* 26.8*  --   --  26.2*  --   PLT  --   --  295 336 309  --   --  373  --   APTT 47*  --  66* 59*  --   --   --   --   --   HEPARINUNFRC  --    < > 0.32 0.26* 0.16* 0.16* 0.42  --  0.40  CREATININE  --   --  0.75  --  0.69  --   --  0.64  --    < > = values in this interval not displayed.    Estimated Creatinine Clearance: 110.8 mL/min (by C-G formula based on SCr of 0.64 mg/dL).  Medications:  Medications Prior to Admission  Medication Sig Dispense Refill Last Dose  . acetaminophen (TYLENOL) 500 MG tablet Take 1,000 mg by mouth every 6 (six) hours as needed for fever.   11/14/2017 at Unknown time  . Ferrous Sulfate (IRON) 325 (65 Fe) MG TABS Take 1 tablet (325 mg total) by mouth daily. 30 each 11 11/14/2017 at Unknown time  . megestrol (MEGACE) 40 MG tablet Take 2 tablets (80 mg total) by mouth 2 (two) times daily. 120 tablet 5 11/14/2017 at Unknown time  . Multiple Vitamin (MULTIVITAMIN WITH MINERALS) TABS tablet Take 1 tablet by mouth daily.   Past Week at Unknown time  . traMADol (ULTRAM) 50 MG tablet Take 1 tablet (50 mg total) by mouth 3 (three) times daily as needed. (Patient taking differently: Take 50 mg by mouth 3 (three) times daily as needed for moderate pain. ) 30 tablet 0 11/15/2017 at Unknown time  . XARELTO 20 MG TABS tablet TAKE 1 TABLET (20 MG TOTAL) BY MOUTH DAILY WITH SUPPER. 30  tablet 11 11/14/2017 at 0900 pm    Assessment: 42 yo on Xarelto PTA for antiphospholipid syndrome with multiple clots. 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 Xarelto 20 qd PTA LD 3/20 at 2100. Baseline aPTT 42, baseline HL 1.04. Baseline INR 1.79 (elevated 2nd xarelto). May need IVC filter.   11/19/2017   Heparin level 0.40, therapeutic  Hgb 7.9 low but stable, plt 373  no bleeding or infusion issues per RN  Goal of Therapy:  Heparin level 0.3-0.7 units/ml aPTT 66-102 seconds Monitor platelets by anticoagulation protocol: Yes   Plan:   Continue heparin at 2500 units/hr   Daily HL   Daily CBC (MD checking BID)  Per MD notes, plan to speak to hematology regarding anticoagulation options. Follow up recommendations     Dolly Rias RPh 11/19/2017, 10:31 AM Pager [phone]

## 2017-11-19 NOTE — Progress Notes (Signed)
IVC/Iliac duplex has been completed. There is no obvious evidence of thrombus in the visualized segments of the IVC and iliac veins. Limited study due to overlying bowel gas and patient history of large fibroid tumors.   11/19/17 10:17 AM Carlos Levering RVT

## 2017-11-19 NOTE — Progress Notes (Signed)
Referring Physician(s): Constant,P  Supervising Physician: Marybelle Killings  Patient Status:  [facility] - In-pt  Chief Complaint:  Pelvic pain, tubo-ovarian abscess  Subjective: Pt doing ok today; has some soreness at left TG drain site; only minimal vaginal spotting   Allergies: Patient has no known allergies.  Medications: Prior to Admission medications   Medication Sig Start Date End Date Taking? Authorizing Provider  acetaminophen (TYLENOL) 500 MG tablet Take 1,000 mg by mouth every 6 (six) hours as needed for fever.   Yes [provider]  Ferrous Sulfate (IRON) 325 (65 Fe) MG TABS Take 1 tablet (325 mg total) by mouth daily. 10/22/17  Yes Venia Carbon, MD  megestrol (MEGACE) 40 MG tablet Take 2 tablets (80 mg total) by mouth 2 (two) times daily. 10/08/17  Yes Anyanwu, Sallyanne Havers, MD  Multiple Vitamin (MULTIVITAMIN WITH MINERALS) TABS tablet Take 1 tablet by mouth daily.   Yes [provider]  traMADol (ULTRAM) 50 MG tablet Take 1 tablet (50 mg total) by mouth 3 (three) times daily as needed. Patient taking differently: Take 50 mg by mouth 3 (three) times daily as needed for moderate pain.  11/12/17  Yes Venia Carbon, MD  XARELTO 20 MG TABS tablet TAKE 1 TABLET (20 MG TOTAL) BY MOUTH DAILY WITH SUPPER. 09/13/17  Yes Venia Carbon, MD     Vital Signs: BP 118/78 (BP Location: Left Arm)   Pulse (!) 119   Temp 98 F (36.7 C) (Oral)   Resp 18   Ht 5' 9"  (1.753 m)   Wt 203 lb 7.8 oz (92.3 kg)   SpO2 99%   BMI 30.05 kg/m   Physical Exam alert; left TG drain intact, output 20 cc turbid, blood-tinged fluid; dressing dry, site mildly tender  Imaging: Ct Abdomen Pelvis W Contrast  Result Date: 11/15/2017 CLINICAL DATA:  Fever, tachycardia, and leukocytosis. History of uterine fibroids. EXAM: CT ABDOMEN AND PELVIS WITH CONTRAST TECHNIQUE: Multidetector CT imaging of the abdomen and pelvis was performed using the standard protocol following bolus  administration of intravenous contrast. CONTRAST:  100 cc ISOVUE-300 IOPAMIDOL (ISOVUE-300) INJECTION 61% COMPARISON:  CT abdomen pelvis dated August 02, 2017. FINDINGS: Lower chest: No acute abnormality. Hepatobiliary: No focal liver abnormality. The gallbladder is distended without wall thickening, gallstones, or biliary dilatation. Pancreas: Unremarkable. No pancreatic ductal dilatation or surrounding inflammatory changes. Spleen: Normal in size without focal abnormality. Adrenals/Urinary Tract: The adrenal glands are unremarkable. Delayed enhancement and excretion of contrast from the bilateral kidneys. Mild bilateral hydronephrosis. No focal renal lesion. No renal or ureteral calculi. Mild anterior and rightward displacement of the bladder, which is otherwise unremarkable in appearance. Stomach/Bowel: Stomach is within normal limits. Appendix is not definitely visualized, however there are no secondary inflammatory changes at the base of the cecum. No evidence of bowel wall thickening, distention, or inflammatory changes. Vascular/Lymphatic: Hypodense filling defect within the IVC. Multiple enlarged left external iliac lymph nodes measuring up to 13 mm in short axis. Multiple mildly enlarged retroperitoneal lymph nodes measuring up to 12 mm in short axis. Reproductive: Interval increase in size of the uterus, now measuring up to 11.2 x 17.5 x 18.4 cm, previously 11.0 x 13.2 x 15.5 cm, with interval increase in size of the large posterior intramural fibroid. Interval increase in size of the multiloculated, fluid and air-filled left adnexal mass, measuring 13.8 x 8.4 x 11.1 cm. There are surrounding inflammatory changes. The right adnexa is unremarkable. Other: Small amount of free fluid in  the pelvis. No pneumoperitoneum. Small fat containing umbilical hernia. Musculoskeletal: No acute or significant osseous findings. IMPRESSION: 1. Large multiloculated, fluid and air-filled left adnexal mass, measuring 13.8 x  8.4 x 11.1 cm, with surrounding inflammatory changes, concerning for tubo-ovarian abscess. 2. Interval increase in size of the uterus due to enlargement of a large posterior intramural fibroid. 3. New mild bilateral hydronephrosis and delayed renal function, likely secondary to bilateral distal ureteral obstruction due to pelvic pathology. 4. Hypodense filling defect within the IVC, concerning for thrombus. 5. Mild retroperitoneal and left external iliac lymphadenopathy is nonspecific, but may be reactive. These results were called by telephone at the time of interpretation on 11/15/2017 at 6:34 pm to Dr. Carmon Sails , who verbally acknowledged these results. Electronically Signed   By: Titus Dubin M.D.   On: 11/15/2017 18:36   Ct Image Guided Drainage By Percutaneous Catheter  Result Date: 11/17/2017 INDICATION: 42 year old with a large complex pelvic fluid collection which is concerning for a tubo-ovarian abscess. Patient has an IVC thrombus and currently anticoagulated. EXAM: CT GUIDED DRAINAGE OF PELVIC ABSCESS MEDICATIONS: The patient is currently admitted to the hospital and receiving intravenous antibiotics. The antibiotics were administered within an appropriate time frame prior to the initiation of the procedure. ANESTHESIA/SEDATION: 1.5 mg IV Versed 125 mcg IV Fentanyl Moderate Sedation Time: 30 minutes The patient was continuously monitored during the procedure by the interventional radiology nurse under my direct supervision. COMPLICATIONS: None immediate. TECHNIQUE: Informed written consent was obtained from the patient after a thorough discussion of the procedural risks, benefits and alternatives. All questions were addressed. Specifically, patient was aware of the bleeding risk. Due to patient's IVC thrombus, we elected to perform the procedure with the patient on IV heparin. Timeout was performed prior to the initiation of the procedure. PROCEDURE: Patient was placed prone on the CT  scanner. Images of the pelvis were obtained. The left gluteal region was prepped with chlorhexidine and a sterile field was created. Skin and soft tissues were anesthetized with 1% lidocaine. Using CT guidance, an 18 gauge trocar needle was directed into the pelvic fluid collection from a left transgluteal approach. Purulent bloody fluid was aspirated. A stiff Amplatz wire was advanced into the pelvic collection. Tract was dilated to accommodate a 10 [place] multipurpose drain. Approximately 50 mL of bloody purulent fluid was removed. Specimen was sent for culture. Catheter was sutured to skin. FINDINGS: Again noted are complex pelvic fluid collections compatible with a tubo-ovarian abscess. The caudal aspect of the collection contains fluid. The bloody purulent fluid was removed from this collection. However, there is a more cephalad component containing air-fluid levels. Initially, no air was able to be aspirated. IMPRESSION: CT-guided placement of a drainage catheter within the pelvic fluid collection. Bloody purulent fluid was removed and findings are compatible with a tubo-ovarian abscess. However, the pelvic fluid collection appears to be complex with air-fluid collections in the more cephalad component. Patient will need follow up imaging to see if this drain is able to decompress these other collections. Electronically Signed   By: Markus Daft M.D.   On: 11/17/2017 11:26    Labs:  CBC: Recent Labs    11/17/17 0821 11/17/17 1956 11/18/17 0521 11/19/17 0539  WBC 22.7* 23.9* 18.9* 15.0*  HGB 7.4* 8.1* 8.1* 7.9*  HCT 23.5* 26.2* 26.8* 26.2*  PLT 295 336 309 373    COAGS: Recent Labs    10/02/17 2204 11/16/17 0046  11/16/17 1536 11/16/17 2245 11/17/17 0821 11/17/17 1956  INR 1.97 1.78  --   --   --   --   --   APTT 46* 42*   < > 41* 47* 66* 59*   < > = values in this interval not displayed.    BMP: Recent Labs    11/16/17 0046 11/17/17 0821 11/18/17 0521 11/19/17 0539  NA 137  137 138 137  K 3.7 3.1* 3.1* 3.3*  CL 104 104 105 107  CO2 25 23 22 23   GLUCOSE 122* 113* 109* 99  BUN 12 8 9 6   CALCIUM 8.0* 8.1* 7.8* 7.7*  CREATININE 1.08* 0.75 0.69 0.64  GFRNONAA >60 >60 >60 >60  GFRAA >60 >60 >60 >60    LIVER FUNCTION TESTS: Recent Labs    03/08/17 1602 08/02/17 1312 10/02/17 0814 11/15/17 1505  BILITOT 0.2 0.5 0.5 0.6  AST 10 15 14* 23  ALT 7 13* 10* 18  ALKPHOS 56 79 44 77  PROT 7.2 7.8 7.2 7.5  ALBUMIN 3.8 3.9 3.3* 2.2*    Assessment and Plan: Pt s/p drainage of left TOA abscess 3/23; afebrile; WBC 15(18.9), hgb 7.9(8.1), creat nl; drin fluid cx- gm + cocci; cont drain flushes with NS; obtain f/u CT once output <10 cc/ 24 hrs x 2 days; can re address uterine fibroid embolization once recovered from acute infectious illness   Electronically Signed: D. Rowe Robert, PA-C 11/19/2017, 4:57 PM   I spent a total of 15 minutes  at the the patient's bedside AND on the patient's hospital floor or unit, greater than 50% of which was counseling/coordinating care for left pelvic abscess    Patient ID: [name], female   DOB: 1976/04/17, 42 y.o.   MRN: [id number]

## 2017-11-19 NOTE — Progress Notes (Signed)
PROGRESS NOTE [facility]   [name]   YQM:[id number] DOB: [birth year]  DOA: 11/15/2017 PCP: Venia Carbon, MD   Brief Narrative:  [name] is a 42 year old female with medical history significant for DUB associated with fibroids, antiphospholipid syndrome with multiple DVTs and anemia resented to the emergency department complaining of abdominal pain, fever and chills.  Upon ED evaluation patient was found to be febrile, with severe abdominal pain and CT of the abdomen/Pelvis shows large multiloculated abscess measuring 13.8 x 8.4 x 11.1 cm with surrounding inflammatory concerning for tubo-ovarian abscess, mild bilateral hydronephrosis likely secondary to distal ureteral obstruction due to pelvic pathology.  Hypodense filling defect within the IVC concerning for thrombus.  Mild retroperitoneal and left external iliac lymphadenopathy.  WBC elevated at 24.8, hemoglobin 6.1 and potassium 3.4.  UA grossly abnormal.  Patient was admitted with working diagnosis of sepsis secondary to tubo-ovarian abscess and IVC thrombus complicated with symptomatic anemia.  Patient was started on broad-spectrum antibiotics and IV heparin.  Patient was also transfused 2 units of PRBCs.  Subjective: Patient seen and examined, she continues to improve.  Abdominal pain well controlled.  Had IVC ultrasound today which shows no obvious evidence of thrombosis.  Assessment & Plan: Sepsis due to tubo-ovarian abscess Large TOA measuring 13.8 x 8.4 x 11.1 cm on the left Sepsis physiology resolved Initially treated with Zosyn, switched to doxycycline and Flagyl to complete 14 days of antibiotic Repeat CT abdomen/pelvis in a.m. 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  Case discussed with vascular surgery who recommended to continue anticoagulation per hematology.  No need for IVC filter at this time.  Case discussed with Dr. Lindi Adie who recommended to continue patient on Xarelto. will continue heparin drip until CT scan is done in case that  abscess needs surgical procedure.  Abscess improving will switch to Xarelto.   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 Patient is asymptomatic.  Felt to be related to hypovolemia, after blood transfusion this has improved.  No further workup needed  DVT prophylaxis: Heparin drip Code Status: Full code Family Communication: None at bedside Disposition Plan: Home when IVC thrombosis and infectious process under control  Consultants:    OB/GYN  Vascular surgery  Procedures:   None  Antimicrobials: Anti-infectives (From admission, onward)   Start     Dose/Rate Route Frequency Ordered Stop   11/19/17 1400  metroNIDAZOLE (FLAGYL) tablet 500 mg     500 mg Oral Every 8 hours 11/19/17 1034 11/26/17 1359   11/19/17 1200  doxycycline (VIBRA-TABS) tablet 100 mg     100 mg Oral Every 12 hours 11/19/17 1034 11/26/17 0959   11/16/17 1600  vancomycin (VANCOCIN) 1,500 mg in sodium chloride 0.9 % 500 mL IVPB  Status:  Discontinued     1,500  mg 250 mL/hr over 120 Minutes Intravenous Every 24 hours 11/16/17 0125 11/16/17 0849   11/15/17 2359  piperacillin-tazobactam (ZOSYN) IVPB 3.375 g   Status:  Discontinued     3.375 g 12.5 mL/hr over 240 Minutes Intravenous Every 8 hours 11/15/17 2325 11/19/17 1034   11/15/17 1500  piperacillin-tazobactam (ZOSYN) IVPB 3.375 g     3.375 g 100 mL/hr over 30 Minutes Intravenous  Once 11/15/17 1450 11/15/17 1553   11/15/17 1500  vancomycin (VANCOCIN) IVPB 1000 mg/200 mL premix  Status:  Discontinued     1,000 mg 200 mL/hr over 60 Minutes Intravenous  Once 11/15/17 1450 11/15/17 1452   11/15/17 1500  vancomycin (VANCOCIN) 2,000 mg in sodium chloride 0.9 % 500 mL IVPB     2,000 mg 250 mL/hr over 120 Minutes Intravenous  Once 11/15/17 1452 11/15/17 1808          Objective: Vitals:   11/18/17 1803 11/18/17 2016 11/19/17 0540 11/19/17 1405  BP: 110/64 124/76 119/86 118/78  Pulse: (!) 116 (!) 105 95 (!) 119  Resp: 16 18 20 18   Temp: 98.3 F (36.8 C) 98 F (36.7 C) 98 F (36.7 C)   TempSrc: Oral Oral Oral Oral  SpO2: 100% 100% 96% 99%  Weight:      Height:        Intake/Output Summary (Last 24 hours) at 11/19/2017 1505 Last data filed at 11/19/2017 1328 Gross per 24 hour  Intake 4702.59 ml  Output 20 ml  Net 4682.59 ml   Filed Weights   11/16/17 0031  Weight: 92.3 kg (203 lb 7.8 oz)    Examination:  General: Pt is alert, awake, not in acute distress Cardiovascular: RRR, S1/S2 +, no rubs, no gallops Respiratory: CTA bilaterally, no wheezing, no rhonchi Abdominal: Soft, NT, ND, bowel sounds +, drain in place with serosanguineous material Extremities: no edema, no cyanosis   Data Reviewed: I have personally reviewed following labs and imaging studies  CBC: Recent Labs  Lab 11/13/17 1457 11/15/17 1110  11/15/17 1505 11/16/17 0806 11/16/17 1837 11/17/17 0821 11/17/17 1956 11/18/17 0521 11/19/17 0539  WBC 24.3* 21.1*   < > 24.8* 20.8* 24.1* 22.7* 23.9* 18.9* 15.0*  NEUTROABS 19.9* 16.5*  --  19.1* 17.3*  --   --   --  15.2*  --   HGB  --   --    < > 6.1* 7.3* 8.4* 7.4* 8.1* 8.1* 7.9*  HCT 23.9* 20.7*  --   20.6* 23.5* 27.3* 23.5* 26.2* 26.8* 26.2*  MCV 83.0 79.5  --  83.1 83.0 83.0 83.0 85.1 84.8 85.6  PLT 246 291   < > 306 264 312 295 336 309 373   < > = values in this interval not displayed.   Basic Metabolic Panel: Recent Labs  Lab 11/15/17 1505 11/16/17 0046 11/17/17 0821 11/18/17 0521 11/19/17 0539  NA 133* 137 137 138 137  K 3.4* 3.7 3.1* 3.1* 3.3*  CL 95* 104 104 105 107  CO2 25 25 23 22 23   GLUCOSE 123* 122* 113* 109* 99  BUN 13 12 8 9 6   CREATININE 0.99 1.08* 0.75 0.69 0.64  CALCIUM 8.5* 8.0* 8.1* 7.8* 7.7*  MG  --   --   --  1.7 1.6*   GFR: Estimated Creatinine Clearance: 110.8 mL/min (by C-G formula based on SCr of 0.64 mg/dL). Liver Function Tests: Recent Labs  Lab 11/15/17 1505  AST 23  ALT 18  ALKPHOS 77  BILITOT 0.6  PROT 7.5  ALBUMIN 2.2*   Recent Labs  Lab 11/15/17 1505  LIPASE 14   No results for input(s): AMMONIA in the last 168 hours. Coagulation Profile: Recent Labs  Lab 11/16/17 0046  INR 1.78   Cardiac Enzymes: No results for input(s): CKTOTAL, CKMB, CKMBINDEX, TROPONINI in the last 168 hours. BNP (last 3 results) No results for input(s): PROBNP in the last 8760 hours. HbA1C: No results for input(s): HGBA1C in the last 72 hours. CBG: No results for input(s): GLUCAP in the last 168 hours. Lipid Profile: No results for input(s): CHOL, HDL, LDLCALC, TRIG, CHOLHDL, LDLDIRECT in the last 72 hours. Thyroid Function Tests: No results for input(s): TSH, T4TOTAL, FREET4, T3FREE, THYROIDAB in the last 72 hours. Anemia Panel: No results for input(s): VITAMINB12, FOLATE, FERRITIN, TIBC, IRON, RETICCTPCT in the last 72 hours. Sepsis Labs: Recent Labs  Lab 11/15/17 1522 11/16/17 0046  PROCALCITON  --  46.61  LATICACIDVEN 1.42  --     Recent Results (from the past 240 hour(s))  Blood Culture (routine x 2)     Status: None (Preliminary result)   Collection Time: 11/15/17  3:06 PM  Result Value Ref Range Status   Specimen Description    Final    BLOOD RIGHT ANTECUBITAL Performed at [facility] [address], [place], [place] [address]    Special Requests   Final    BOTTLES DRAWN AEROBIC AND ANAEROBIC Blood Culture adequate volume Performed at [facility] [address], [place], [place] [address]    Culture   Final    NO GROWTH 4 DAYS Performed at [facility], [place] [address] [place], [place] [address]    Report Status PENDING  Incomplete  Blood Culture (routine x 2)     Status: None (Preliminary result)   Collection Time: 11/15/17  3:06 PM  Result Value Ref Range Status   Specimen Description   Final    BLOOD LEFT ANTECUBITAL Performed at [facility] [address] [place], [place] [address]    Special Requests   Final    BOTTLES DRAWN AEROBIC AND ANAEROBIC Blood Culture adequate volume Performed at [facility] [address], [place], [place] [address]    Culture   Final    NO GROWTH 4 DAYS Performed at [facility], [place] [address] [place], [place] [address]    Report Status PENDING  Incomplete  Urine culture     Status: Abnormal   Collection Time: 11/15/17  5:38 PM  Result Value Ref Range Status   Specimen Description   Final    URINE, RANDOM Performed at [facility] [address] [place], [place] [address]    Special Requests   Final    NONE Performed at [facility], [place] [address], [place], [place] [address]    Culture MULTIPLE SPECIES PRESENT, SUGGEST RECOLLECTION (A)  Final   Report Status 11/17/2017 FINAL  Final  MRSA PCR Screening     Status: None   Collection Time: 11/16/17 12:28 AM  Result Value Ref Range Status   MRSA by PCR NEGATIVE NEGATIVE Final    Comment:        The GeneXpert MRSA Assay (FDA approved for NASAL specimens only), is one component of a comprehensive MRSA colonization surveillance program. It is not intended to diagnose  MRSA infection nor to guide or monitor treatment for MRSA infections. Performed at [facility], [place]  [place], [place] [address]   Aerobic/Anaerobic Culture (surgical/deep wound)     Status: None (Preliminary result)   Collection Time: 11/17/17 10:39 AM  Result Value Ref Range Status   Specimen Description   Final    ABSCESS PELVIS Performed at [facility], [place] [address] [place], [place] [address]    Special Requests   Final    NONE Performed at [facility], [place] [address], [place], [place] [address]    Gram Stain   Final    ABUNDANT WBC PRESENT, PREDOMINANTLY PMN ABUNDANT GRAM POSITIVE COCCI IN PAIRS    Culture   Final    NO GROWTH 2 DAYS NO ANAEROBES ISOLATED; CULTURE IN PROGRESS FOR 5 DAYS Performed at [facility] [address], [place], [place] [address]    Report Status PENDING  Incomplete      Radiology Studies: No results found.    Scheduled Meds: . doxycycline  100 mg Oral Q12H  . ferrous sulfate  325 mg Oral Daily  . megestrol  80 mg Oral BID  . metroNIDAZOLE  500 mg Oral Q8H  . polyethylene glycol  17 g Oral Daily  . sodium chloride flush  3 mL Intravenous Q12H  . sodium chloride flush  5 mL Intracatheter Q8H   Continuous Infusions: . heparin 2,500 Units/hr (11/19/17 0713)  . lactated ringers Stopped (11/19/17 1334)     LOS: 4 days    Time spent: Total of 25 minutes spent with pt, greater than 50% of which was spent in discussion of  treatment, counseling and coordination of care   Chipper Oman, MD Pager: Text Page via [email or web address]   If 7PM-7AM, please contact night-coverage [email or web address] 11/19/2017, 3:05 PM   Note - This record has been created using [employer]. Chart creation errors have been sought, but may not always have been located. Such creation errors do not reflect on the standard of medical care.

## 2017-11-19 NOTE — Progress Notes (Signed)
ANTICOAGULATION CONSULT NOTE  Pharmacy Consult for heparin Indication: IVC thrombosis  No Known Allergies  Patient Measurements: Height: 5' 9"  (175.3 cm) Weight: 203 lb 7.8 oz (92.3 kg) IBW/kg (Calculated) : 66.2 Heparin Dosing Weight: 85.6 kg  Vital Signs: Temp: 98 F (36.7 C) (03/24 2016) Temp Source: Oral (03/24 2016) BP: 124/76 (03/24 2016) Pulse Rate: 105 (03/24 2016)  Labs: Recent Labs    11/16/17 0046  11/16/17 2245 11/17/17 0821 11/17/17 1956 11/18/17 0521 11/18/17 1340 11/18/17 2238  HGB  --    < >  --  7.4* 8.1* 8.1*  --   --   HCT  --    < >  --  23.5* 26.2* 26.8*  --   --   PLT  --    < >  --  295 336 309  --   --   APTT 42*   < > 47* 66* 59*  --   --   --   LABPROT 20.5*  --   --   --   --   --   --   --   INR 1.78  --   --   --   --   --   --   --   HEPARINUNFRC 1.04*   < >  --  0.32 0.26* 0.16* 0.16* 0.42  CREATININE 1.08*  --   --  0.75  --  0.69  --   --    < > = values in this interval not displayed.    Estimated Creatinine Clearance: 110.8 mL/min (by C-G formula based on SCr of 0.69 mg/dL).  Medications:  Medications Prior to Admission  Medication Sig Dispense Refill Last Dose  . acetaminophen (TYLENOL) 500 MG tablet Take 1,000 mg by mouth every 6 (six) hours as needed for fever.   11/14/2017 at Unknown time  . Ferrous Sulfate (IRON) 325 (65 Fe) MG TABS Take 1 tablet (325 mg total) by mouth daily. 30 each 11 11/14/2017 at Unknown time  . megestrol (MEGACE) 40 MG tablet Take 2 tablets (80 mg total) by mouth 2 (two) times daily. 120 tablet 5 11/14/2017 at Unknown time  . Multiple Vitamin (MULTIVITAMIN WITH MINERALS) TABS tablet Take 1 tablet by mouth daily.   Past Week at Unknown time  . traMADol (ULTRAM) 50 MG tablet Take 1 tablet (50 mg total) by mouth 3 (three) times daily as needed. (Patient taking differently: Take 50 mg by mouth 3 (three) times daily as needed for moderate pain. ) 30 tablet 0 11/15/2017 at Unknown time  . XARELTO 20 MG TABS tablet  TAKE 1 TABLET (20 MG TOTAL) BY MOUTH DAILY WITH SUPPER. 30 tablet 11 11/14/2017 at 0900 pm    Assessment: 42 yo on Xarelto PTA for antiphospholipid syndrome with multiple clots. 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 Xarelto 20 qd PTA LD 3/20 at 2100. Baseline aPTT 42, baseline HL 1.04. Baseline INR 1.79 (elevated 2nd xarelto). May need IVC filter.   3/23  Hg  7.4 after PRBC x 3; Plt wnl  APTT 66 sec - at low end of goal after 1500 unit bolus and rate incr to 1700 units/hr  HL 0.32 (may be correlating with aPTT now that xarelto out of system)  Patient normally has vaginal/uterine bleeding d/t fibroids, but reports her bleeding is actually less than at home  IR drainage tentatively scheduled for today Today, 3/24  0521 HL = 0.16 below goal, no infusion or bleeding issues per RN.  1340 HL is 0.16, subtherapeutic  Hgb 8.1 low but stable, plt 309   2238 HL = 0.42 at goal !, no bleeding or infusion issues per RN  Goal of Therapy:  Heparin level 0.3-0.7 units/ml aPTT 66-102 seconds Monitor platelets by anticoagulation protocol: Yes   Plan:   Will increase slightly to 2500 units/hr to ensure stays in therapeutic range as bolus wears off  Daily HL   Daily CBC (MD checking BID)  Per MD notes, plan to speak to hematology regarding anticoagulation options. Follow up recommendations     Dorrene German 11/19/2017 12:21 AM

## 2017-11-20 ENCOUNTER — Encounter (HOSPITAL_COMMUNITY): Payer: Self-pay | Admitting: Radiology

## 2017-11-20 ENCOUNTER — Ambulatory Visit (HOSPITAL_COMMUNITY): Payer: BC Managed Care – PPO

## 2017-11-20 ENCOUNTER — Inpatient Hospital Stay (HOSPITAL_COMMUNITY): Payer: BC Managed Care – PPO

## 2017-11-20 ENCOUNTER — Other Ambulatory Visit (HOSPITAL_COMMUNITY): Payer: BC Managed Care – PPO

## 2017-11-20 LAB — CULTURE, BLOOD (ROUTINE X 2)
Culture: NO GROWTH
Culture: NO GROWTH
SPECIAL REQUESTS: ADEQUATE
SPECIAL REQUESTS: ADEQUATE

## 2017-11-20 LAB — CBC
HCT: 25.6 % — ABNORMAL LOW (ref 36.0–46.0)
Hemoglobin: 7.8 g/dL — ABNORMAL LOW (ref 12.0–15.0)
MCH: 25.9 pg — AB (ref 26.0–34.0)
MCHC: 30.5 g/dL (ref 30.0–36.0)
MCV: 85 fL (ref 78.0–100.0)
PLATELETS: 391 10*3/uL (ref 150–400)
RBC: 3.01 MIL/uL — ABNORMAL LOW (ref 3.87–5.11)
RDW: 17.6 % — AB (ref 11.5–15.5)
WBC: 16.5 10*3/uL — ABNORMAL HIGH (ref 4.0–10.5)

## 2017-11-20 LAB — BASIC METABOLIC PANEL
ANION GAP: 8 (ref 5–15)
BUN: 5 mg/dL — ABNORMAL LOW (ref 6–20)
CO2: 25 mmol/L (ref 22–32)
CREATININE: 0.62 mg/dL (ref 0.44–1.00)
Calcium: 8 mg/dL — ABNORMAL LOW (ref 8.9–10.3)
Chloride: 107 mmol/L (ref 101–111)
GFR calc Af Amer: >60 mL/min (ref >60)
Glucose, Bld: 96 mg/dL (ref 65–99)
Potassium: 2.9 mmol/L — ABNORMAL LOW (ref 3.5–5.1)
Sodium: 140 mmol/L (ref 135–145)

## 2017-11-20 LAB — HEPARIN LEVEL (UNFRACTIONATED): Heparin Unfractionated: 0.36 IU/mL (ref 0.30–0.70)

## 2017-11-20 LAB — MAGNESIUM: Magnesium: 1.2 mg/dL — ABNORMAL LOW (ref 1.7–2.4)

## 2017-11-20 MED ORDER — IOPAMIDOL (ISOVUE-300) INJECTION 61%
INTRAVENOUS | Status: AC
  Filled 2017-11-20: qty 100

## 2017-11-20 MED ORDER — POTASSIUM CHLORIDE 10 MEQ/100ML IV SOLN
10.0000 meq | INTRAVENOUS | Status: AC
  Administered 2017-11-20 (×3): 10 meq via INTRAVENOUS
  Filled 2017-11-20 (×3): qty 100

## 2017-11-20 MED ORDER — IOPAMIDOL (ISOVUE-300) INJECTION 61%: 15.0000 mL | Freq: Once | INTRAVENOUS | Status: AC | PRN

## 2017-11-20 MED ORDER — MAGNESIUM SULFATE 2 GM/50ML IV SOLN
2.0000 g | Freq: Once | INTRAVENOUS | Status: AC
  Administered 2017-11-20: 2 g via INTRAVENOUS
  Filled 2017-11-20: qty 50

## 2017-11-20 MED ORDER — IOPAMIDOL (ISOVUE-300) INJECTION 61%
INTRAVENOUS | Status: AC
  Filled 2017-11-20: qty 30

## 2017-11-20 MED ORDER — IOPAMIDOL (ISOVUE-300) INJECTION 61%
100.0000 mL | Freq: Once | INTRAVENOUS | Status: AC | PRN
  Administered 2017-11-20: 100 mL via INTRAVENOUS

## 2017-11-20 NOTE — Progress Notes (Signed)
PROGRESS NOTE [facility]   [name]   ZOX:[id number] DOB: [birth year]  DOA: 11/15/2017 PCP: Venia Carbon, MD   Brief Narrative:  [name] is a 42 year old female with medical history significant for DUB associated with fibroids, antiphospholipid syndrome with multiple DVTs and anemia resented to the emergency department complaining of abdominal pain, fever and chills.  Upon ED evaluation patient was found to be febrile, with severe abdominal pain and CT of the abdomen/Pelvis shows large multiloculated abscess measuring 13.8 x 8.4 x 11.1 cm with surrounding inflammatory concerning for tubo-ovarian abscess, mild bilateral hydronephrosis likely secondary to distal ureteral obstruction due to pelvic pathology.  Hypodense filling defect within the IVC concerning for thrombus.  Mild retroperitoneal and left external iliac lymphadenopathy.  WBC elevated at 24.8, hemoglobin 6.1 and potassium 3.4.  UA grossly abnormal.  Patient was admitted with working diagnosis of sepsis secondary to tubo-ovarian abscess and IVC thrombus complicated with symptomatic anemia.  Patient was started on broad-spectrum antibiotics and IV heparin.  Patient was also transfused 2 units of PRBCs.  Subjective: Patient seen and examined, abdominal pain is minimal.  Continues to do well.  Today complaining of leg swelling and feeling bloated.  Assessment & Plan: Sepsis due to tubo-ovarian abscess Large TOA measuring 13.8 x 8.4 x 11.1 cm on the left Sepsis physiology resolved Initially treated with Zosyn, switched to doxycycline and Flagyl to complete 14 days of antibiotic Repeated CT 3/26 shows large transgluteal catheter place for treatment of left tubo-ovarian abscess inferior aspect of the abscess has been draining however large component does not appear to communicate with drain.  Risk is well abscess measure 9.6 x 10.8 x 9.9. Case has been discussed with IR,  they will evaluate the case and make  recommendation in either possible advancing the catheter or repeat CT guided drain.  WBC not improving much, blood cultures negative so far.  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  Will continue heparin drip for now as patient may need repeat CT-guided drainage.  Again  might need to discuss IVC filter for future procedures if need to be off anticoagulation. For now Dr. Lindi Adie recommending to keep patient on Xarelto once she is ready for discharge.  Symptomatic acute anemia due chronic bleed - DUB Has been transfused 4 units of PRBC during hospital stay IV iron x1 Hemoglobin at 7.8 Continues to have vaginal bleed Transfuse if hemoglobin less than 7.0  DUB from uterine fibroids CT abdomen and pelvis shows enlargement in size of posterior intramural fibroid Patient has previously failed endometrial ablation Plans are to perform embolization, this will happen after resolution of acute condition Continue Megace  Defer to OB/GYN and IR  Mild anasarca May be due to aggressive hydration and hypoproteinemia Discontinue IV fluids, elevate legs, TED hose Will hold on diuresis for now, if no improvement may need low-dose Lasix Respiratory status intact  Hypokalemia/hypomagnesemia Replete Check BMP and mag in a.m.  Sinus tachycardia - significantly improved Chart review shows that patient has been in sinus tachycardia for long time. Patient is asymptomatic.  Felt to be related to hypovolemia, after blood transfusion this has improved.  No further workup needed  DVT prophylaxis: Heparin drip Code Status: Full code Family Communication: None at  bedside Disposition Plan: Home when IVC thrombosis and infectious process  under control  Consultants:    OB/GYN  Vascular surgery  Procedures:   None  Antimicrobials: Anti-infectives (From admission, onward)   Start     Dose/Rate Route Frequency Ordered Stop   11/19/17 1400  metroNIDAZOLE (FLAGYL) tablet 500 mg     500 mg Oral Every 8 hours 11/19/17 1034 11/26/17 1359   11/19/17 1200  doxycycline (VIBRA-TABS) tablet 100 mg     100 mg Oral Every 12 hours 11/19/17 1034 11/26/17 0959   11/16/17 1600  vancomycin (VANCOCIN) 1,500 mg in sodium chloride 0.9 % 500 mL IVPB  Status:  Discontinued     1,500 mg 250 mL/hr over 120 Minutes Intravenous Every 24 hours 11/16/17 0125 11/16/17 0849   11/15/17 2359  piperacillin-tazobactam (ZOSYN) IVPB 3.375 g  Status:  Discontinued     3.375 g 12.5 mL/hr over 240 Minutes Intravenous Every 8 hours 11/15/17 2325 11/19/17 1034   11/15/17 1500  piperacillin-tazobactam (ZOSYN) IVPB 3.375 g     3.375 g 100 mL/hr over 30 Minutes Intravenous  Once 11/15/17 1450 11/15/17 1553   11/15/17 1500  vancomycin (VANCOCIN) IVPB 1000 mg/200 mL premix  Status:  Discontinued     1,000 mg 200 mL/hr over 60 Minutes Intravenous  Once 11/15/17 1450 11/15/17 1452   11/15/17 1500  vancomycin (VANCOCIN) 2,000 mg in sodium chloride 0.9 % 500 mL IVPB     2,000 mg 250 mL/hr over 120 Minutes Intravenous  Once 11/15/17 1452 11/15/17 1808          Objective: Vitals:   11/19/17 1405 11/19/17 2127 11/20/17 0419 11/20/17 1441  BP: 118/78 136/76 (!) 121/94 126/90  Pulse: (!) 119 (!) 109 (!) 104 (!) 120  Resp: 18 18 16 17   Temp:  98.1 F (36.7 C) 98.2 F (36.8 C) 97.8 F (36.6 C)  TempSrc: Oral Oral Oral Oral  SpO2: 99% 97% 100% 100%  Weight:      Height:        Intake/Output Summary (Last 24 hours) at 11/20/2017 1500 Last data filed at 11/20/2017 1022 Gross per 24 hour  Intake 1415 ml  Output 2 ml  Net 1413 ml   Filed Weights   11/16/17 0031  Weight: 92.3 kg (203 lb 7.8 oz)    Examination:  General: NAD Cardiovascular: RRR,  S1/S2 +, no rubs, no gallops Respiratory: CTA bilaterally, no wheeing, no rhonchiz Abdominal: Soft, nontender nondistended, drain in back with minimal serosanguineous material Extremities: Bilateral lower extremity edema 1+  Data Reviewed: I have personally reviewed following labs and imaging studies  CBC: Recent Labs  Lab 11/15/17 1110  11/15/17 1505 11/16/17 0806  11/17/17 0821 11/17/17 1956 11/18/17 0521 11/19/17 0539 11/20/17 0706  WBC 21.1*   < > 24.8* 20.8*   < > 22.7* 23.9* 18.9* 15.0* 16.5*  NEUTROABS 16.5*  --  19.1* 17.3*  --   --   --  15.2*  --   --   HGB  --    < > 6.1* 7.3*   < > 7.4* 8.1* 8.1* 7.9* 7.8*  HCT 20.7*  --  20.6* 23.5*   < > 23.5* 26.2* 26.8* 26.2* 25.6*  MCV 79.5  --  83.1 83.0   < > 83.0 85.1 84.8 85.6 85.0  PLT 291   < > 306 264   < > 295 336 309 373 391   < > = values in this interval not displayed.   Basic Metabolic  Panel: Recent Labs  Lab 11/16/17 0046 11/17/17 0821 11/18/17 0521 11/19/17 0539 11/20/17 0706  NA 137 137 138 137 140  K 3.7 3.1* 3.1* 3.3* 2.9*  CL 104 104 105 107 107  CO2 25 23 22 23 25   GLUCOSE 122* 113* 109* 99 96  BUN 12 8 9 6  5*  CREATININE 1.08* 0.75 0.69 0.64 0.62  CALCIUM 8.0* 8.1* 7.8* 7.7* 8.0*  MG  --   --  1.7 1.6* 1.2*   GFR: Estimated Creatinine Clearance: 110.8 mL/min (by C-G formula based on SCr of 0.62 mg/dL). Liver Function Tests: Recent Labs  Lab 11/15/17 1505  AST 23  ALT 18  ALKPHOS 77  BILITOT 0.6  PROT 7.5  ALBUMIN 2.2*   Recent Labs  Lab 11/15/17 1505  LIPASE 14   No results for input(s): AMMONIA in the last 168 hours. Coagulation Profile: Recent Labs  Lab 11/16/17 0046  INR 1.78   Cardiac Enzymes: No results for input(s): CKTOTAL, CKMB, CKMBINDEX, TROPONINI in the last 168 hours. BNP (last 3 results) No results for input(s): PROBNP in the last 8760 hours. HbA1C: No results for input(s): HGBA1C in the last 72 hours. CBG: No results for input(s): GLUCAP in the last 168  hours. Lipid Profile: No results for input(s): CHOL, HDL, LDLCALC, TRIG, CHOLHDL, LDLDIRECT in the last 72 hours. Thyroid Function Tests: No results for input(s): TSH, T4TOTAL, FREET4, T3FREE, THYROIDAB in the last 72 hours. Anemia Panel: No results for input(s): VITAMINB12, FOLATE, FERRITIN, TIBC, IRON, RETICCTPCT in the last 72 hours. Sepsis Labs: Recent Labs  Lab 11/15/17 1522 11/16/17 0046  PROCALCITON  --  46.61  LATICACIDVEN 1.42  --     Recent Results (from the past 240 hour(s))  Blood Culture (routine x 2)     Status: None   Collection Time: 11/15/17  3:06 PM  Result Value Ref Range Status   Specimen Description   Final    BLOOD RIGHT ANTECUBITAL Performed at [facility] [address] [place], [place] [address]    Special Requests   Final    BOTTLES DRAWN AEROBIC AND ANAEROBIC Blood Culture adequate volume Performed at [facility] [address], [place], [place] [address]    Culture   Final    NO GROWTH 5 DAYS Performed at [facility], [place] [address] [place], [place] [address]    Report Status 11/20/2017 FINAL  Final  Blood Culture (routine x 2)     Status: None   Collection Time: 11/15/17  3:06 PM  Result Value Ref Range Status   Specimen Description   Final    BLOOD LEFT ANTECUBITAL Performed at [facility] [address], [place], [place] [address]    Special Requests   Final    BOTTLES DRAWN AEROBIC AND ANAEROBIC Blood Culture adequate volume Performed at [facility] [address], [place], [place] [address]    Culture   Final    NO GROWTH 5 DAYS Performed at [facility], [place] [address], [place], [place] [address]    Report Status 11/20/2017 FINAL  Final  Urine culture     Status: Abnormal   Collection Time: 11/15/17  5:38 PM  Result Value Ref Range Status   Specimen Description   Final    URINE, RANDOM Performed at [facility] [address] [place], [place] [address]    Special Requests   Final    NONE  Performed at [facility], [place] [address], [place], [place] [address]    Culture MULTIPLE SPECIES PRESENT, SUGGEST RECOLLECTION (A)  Final   Report Status 11/17/2017 FINAL  Final  MRSA PCR Screening     Status: None   Collection Time: 11/16/17 12:28 AM  Result Value Ref Range Status   MRSA by PCR NEGATIVE NEGATIVE Final    Comment:        The GeneXpert MRSA Assay (FDA approved for NASAL specimens only), is one component of a comprehensive MRSA colonization surveillance program. It is not intended to diagnose MRSA infection nor to guide or monitor treatment for MRSA infections. Performed at [facility], [place] [address] [place], [place] [address]   Aerobic/Anaerobic Culture (surgical/deep wound)     Status: None (Preliminary result)   Collection Time: 11/17/17 10:39 AM  Result Value Ref Range Status   Specimen Description   Final    ABSCESS PELVIS Performed at [facility], [place] [address] [place], [place] [address]    Special Requests   Final    NONE Performed at [facility], [place] [address], [place], [place] [address]    Gram Stain   Final    ABUNDANT WBC PRESENT, PREDOMINANTLY PMN ABUNDANT GRAM POSITIVE COCCI IN PAIRS    Culture   Final    NO GROWTH 3 DAYS NO ANAEROBES ISOLATED; CULTURE IN PROGRESS FOR 5 DAYS Performed at [facility] [address], [place], [place] [address]    Report Status PENDING  Incomplete      Radiology Studies: Ct Abdomen Pelvis W Contrast  Result Date: 11/20/2017 CLINICAL DATA:  42 year old female with tubo-ovarian abscess post drainage. Continued lower abdominal pain. Subsequent encounter. EXAM: CT ABDOMEN AND PELVIS WITH CONTRAST TECHNIQUE: Multidetector CT imaging of the abdomen and pelvis was performed using the standard protocol following bolus administration of intravenous contrast.  CONTRAST:  165m ISOVUE-300 IOPAMIDOL (ISOVUE-300) INJECTION 61% COMPARISON:  11/17/2017 and 11/15/2017 CT.  10/22/2017 MR. FINDINGS: Lower chest: Mild basilar atelectasis. Heart size within normal limits. Dense breast parenchyma. Hepatobiliary: Enlarged liver spanning over 22.9 cm. No worrisome hepatic lesion. No calcified gallstone. Pancreas: No pancreatic mass or primary pancreatic inflammation. Spleen: Top normal size spleen without splenic mass. Adrenals/Urinary Tract: Mild bilateral hydronephrosis greater on left. No worrisome renal or adrenal lesion. Noncontrast filled views of the urinary bladder without primary urinary bladder abnormality. Stomach/Bowel: Bowel displaced by pelvic abnormalities as discussed below. No obvious primary bowel abnormality. Bowel extends towards periumbilical hernia but does not enter such. 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 Adenopathy pelvis and left periaortic region. Reproductive: Left tubo-ovarian abscess partially drain with catheter placed left transgluteal position. Suspect that this drain will not adequately treat tubo-ovarian abscess which appears loculated with large residual component anterior to the drain measuring 9.6 x 10.8 x 9.9 cm. Small loculated component inferior left lateral aspect. Markedly enlarged uterus with superior extension displacement of surrounding structures containing multiple fibroids some of which may be necrotic. Other: Third spacing of fluid. Periumbilical fat, vessel and small amount of fluid containing hernia. Musculoskeletal: Bilateral sacroiliac joint degenerative changes. IMPRESSION: Left transgluteal catheter placed for treatment of left tubo-ovarian abscess. Inferior aspect of the abscess has been drained however, larger component does not appear to communicate with drain. This aspect of residual abscess measures 9.6 x 10.8 x 9.9 cm. Small loculated component inferior left  lateral aspect. Thrombus within the infrarenal  inferior vena cava spanning over 6.6 cm once again noted. Pelvic and left periaortic adenopathy. Bilateral hydronephrosis greater on the left probably caused by the pelvic abnormalities. Markedly enlarged uterus with multiple fibroids some of which are necrotic. Third spacing of fluid. Periumbilical fat, vessel small amount of fluid containing hernia. Enlarged liver spanning over a 22.9 cm. Images reviewed with radiology interventional service. Electronically Signed   By: Genia Del M.D.   On: 11/20/2017 14:26      Scheduled Meds: . doxycycline  100 mg Oral Q12H  . ferrous sulfate  325 mg Oral Daily  . iopamidol      . iopamidol      . megestrol  80 mg Oral BID  . metroNIDAZOLE  500 mg Oral Q8H  . polyethylene glycol  17 g Oral Daily  . sodium chloride flush  3 mL Intravenous Q12H  . sodium chloride flush  5 mL Intracatheter Q8H   Continuous Infusions: . heparin 2,500 Units/hr (11/20/17 1401)     LOS: 5 days    Time spent: Total of 25 minutes spent with pt, greater than 50% of which was spent in discussion of  treatment, counseling and coordination of care   Chipper Oman, MD Pager: Text Page via [email or web address]   If 7PM-7AM, please contact night-coverage [email or web address] 11/20/2017, 3:00 PM   Note - This record has been created using [employer]. Chart creation errors have been sought, but may not always have been located. Such creation errors do not reflect on the standard of medical care.

## 2017-11-20 NOTE — Progress Notes (Signed)
ANTICOAGULATION CONSULT NOTE  Pharmacy Consult for heparin Indication: IVC thrombosis  No Known Allergies  Patient Measurements: Height: 5' 9"  (175.3 cm) Weight: 203 lb 7.8 oz (92.3 kg) IBW/kg (Calculated) : 66.2 Heparin Dosing Weight: 85.6 kg  Vital Signs: Temp: 98.2 F (36.8 C) (03/26 0419) Temp Source: Oral (03/26 0419) BP: 121/94 (03/26 0419) Pulse Rate: 104 (03/26 0419)  Labs: Recent Labs    11/17/17 1956 11/18/17 0521  11/18/17 2238 11/19/17 0539 11/19/17 0850 11/20/17 0706  HGB 8.1* 8.1*  --   --  7.9*  --  7.8*  HCT 26.2* 26.8*  --   --  26.2*  --  25.6*  PLT 336 309  --   --  373  --  391  APTT 59*  --   --   --   --   --   --   HEPARINUNFRC 0.26* 0.16*   < > 0.42  --  0.40 0.36  CREATININE  --  0.69  --   --  0.64  --  0.62   < > = values in this interval not displayed.    Estimated Creatinine Clearance: 110.8 mL/min (by C-G formula based on SCr of 0.62 mg/dL).  Medications:  Medications Prior to Admission  Medication Sig Dispense Refill Last Dose  . acetaminophen (TYLENOL) 500 MG tablet Take 1,000 mg by mouth every 6 (six) hours as needed for fever.   11/14/2017 at Unknown time  . Ferrous Sulfate (IRON) 325 (65 Fe) MG TABS Take 1 tablet (325 mg total) by mouth daily. 30 each 11 11/14/2017 at Unknown time  . megestrol (MEGACE) 40 MG tablet Take 2 tablets (80 mg total) by mouth 2 (two) times daily. 120 tablet 5 11/14/2017 at Unknown time  . Multiple Vitamin (MULTIVITAMIN WITH MINERALS) TABS tablet Take 1 tablet by mouth daily.   Past Week at Unknown time  . traMADol (ULTRAM) 50 MG tablet Take 1 tablet (50 mg total) by mouth 3 (three) times daily as needed. (Patient taking differently: Take 50 mg by mouth 3 (three) times daily as needed for moderate pain. ) 30 tablet 0 11/15/2017 at Unknown time  . XARELTO 20 MG TABS tablet TAKE 1 TABLET (20 MG TOTAL) BY MOUTH DAILY WITH SUPPER. 30 tablet 11 11/14/2017 at 0900 pm    Assessment: 42 yo on Xarelto PTA for  antiphospholipid syndrome with multiple clots. 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 Xarelto 20 qd PTA LD 3/20 at 2100. Baseline aPTT 42, baseline HL 1.04. Baseline INR 1.79 (elevated 2nd xarelto). May need IVC filter.   11/20/2017   Heparin level 0.36, therapeutic  Hgb 7.8 low but stable, plt 391  no bleeding or infusion issues per RN  Goal of Therapy:  Heparin level 0.3-0.7 units/ml aPTT 66-102 seconds Monitor platelets by anticoagulation protocol: Yes   Plan:   Continue heparin at 2500 units/hr   Daily HL   Daily CBC   If no more procedures needed with tx back to [facility] 11/20/2017, 11:31 AM Pager [phone]

## 2017-11-20 NOTE — Progress Notes (Signed)
Referring Physician(s): Constant,P  Supervising Physician: Corrie Mckusick  Patient Status:  [facility] - In-pt  Chief Complaint: Pelvic pain, tubo-ovarian abscess     Subjective: Patient doing okay today; denies worsening abdominal/pelvic pain, nausea, vomiting.  Tolerating diet okay.  Has had no significant bleeding issues.  Allergies: Patient has no known allergies.  Medications: Prior to Admission medications   Medication Sig Start Date End Date Taking? Authorizing Provider  acetaminophen (TYLENOL) 500 MG tablet Take 1,000 mg by mouth every 6 (six) hours as needed for fever.   Yes [provider]  Ferrous Sulfate (IRON) 325 (65 Fe) MG TABS Take 1 tablet (325 mg total) by mouth daily. 10/22/17  Yes Venia Carbon, MD  megestrol (MEGACE) 40 MG tablet Take 2 tablets (80 mg total) by mouth 2 (two) times daily. 10/08/17  Yes Anyanwu, Sallyanne Havers, MD  Multiple Vitamin (MULTIVITAMIN WITH MINERALS) TABS tablet Take 1 tablet by mouth daily.   Yes [provider]  traMADol (ULTRAM) 50 MG tablet Take 1 tablet (50 mg total) by mouth 3 (three) times daily as needed. Patient taking differently: Take 50 mg by mouth 3 (three) times daily as needed for moderate pain.  11/12/17  Yes Venia Carbon, MD  XARELTO 20 MG TABS tablet TAKE 1 TABLET (20 MG TOTAL) BY MOUTH DAILY WITH SUPPER. 09/13/17  Yes Venia Carbon, MD     Vital Signs: BP (!) 121/94 (BP Location: Left Arm)   Pulse (!) 104   Temp 98.2 F (36.8 C) (Oral)   Resp 16   Ht 5' 9"  (1.753 m)   Wt 203 lb 7.8 oz (92.3 kg)   SpO2 100%   BMI 30.05 kg/m   Physical Exam awake, alert.  Chest clear to auscultation bilaterally.  Heart with regular rate and rhythm.  Abdomen soft, positive bowel sounds, mildly tender mid pelvic region to palpation ;left transgluteal drain intact, output minimal, cultures pending  Imaging: Ct Abdomen Pelvis W Contrast  Result Date: 11/20/2017 CLINICAL DATA:  42 year old female with  tubo-ovarian abscess post drainage. Continued lower abdominal pain. Subsequent encounter. EXAM: CT ABDOMEN AND PELVIS WITH CONTRAST TECHNIQUE: Multidetector CT imaging of the abdomen and pelvis was performed using the standard protocol following bolus administration of intravenous contrast. CONTRAST:  159m ISOVUE-300 IOPAMIDOL (ISOVUE-300) INJECTION 61% COMPARISON:  11/17/2017 and 11/15/2017 CT.  10/22/2017 MR. FINDINGS: Lower chest: Mild basilar atelectasis. Heart size within normal limits. Dense breast parenchyma. Hepatobiliary: Enlarged liver spanning over 22.9 cm. No worrisome hepatic lesion. No calcified gallstone. Pancreas: No pancreatic mass or primary pancreatic inflammation. Spleen: Top normal size spleen without splenic mass. Adrenals/Urinary Tract: Mild bilateral hydronephrosis greater on left. No worrisome renal or adrenal lesion. Noncontrast filled views of the urinary bladder without primary urinary bladder abnormality. Stomach/Bowel: Bowel displaced by pelvic abnormalities as discussed below. No obvious primary bowel abnormality. Bowel extends towards periumbilical hernia but does not enter such. 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 Adenopathy pelvis and left periaortic region. Reproductive: Left tubo-ovarian abscess partially drain with catheter placed left transgluteal position. Suspect that this drain will not adequately treat tubo-ovarian abscess which appears loculated with large residual component anterior to the drain measuring 9.6 x 10.8 x 9.9 cm. Small loculated component inferior left lateral aspect. Markedly enlarged uterus with superior extension displacement of surrounding structures containing multiple fibroids some of which may be necrotic. Other: Third spacing of fluid. Periumbilical fat, vessel and small amount of fluid containing  hernia. Musculoskeletal: Bilateral sacroiliac joint degenerative changes. IMPRESSION: Left  transgluteal catheter placed for treatment of left tubo-ovarian abscess. Inferior aspect of the abscess has been drained however, larger component does not appear to communicate with drain. This aspect of residual abscess measures 9.6 x 10.8 x 9.9 cm. Small loculated component inferior left lateral aspect. 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 Pelvic and left periaortic adenopathy. Bilateral hydronephrosis greater on the left probably caused by the pelvic abnormalities. Markedly enlarged uterus with multiple fibroids some of which are necrotic. Third spacing of fluid. Periumbilical fat, vessel small amount of fluid containing hernia. Enlarged liver spanning over a 22.9 cm. Images reviewed with radiology interventional service. Electronically Signed   By: Genia Del M.D.   On: 11/20/2017 14:26   Ct Image Guided Drainage By Percutaneous Catheter  Result Date: 11/17/2017 INDICATION: 42 year old with a large complex pelvic fluid collection which is concerning for a tubo-ovarian abscess. Patient has an IVC thrombus and currently anticoagulated. EXAM: CT GUIDED DRAINAGE OF PELVIC ABSCESS MEDICATIONS: The patient is currently admitted to the hospital and receiving intravenous antibiotics. The antibiotics were administered within an appropriate time frame prior to the initiation of the procedure. ANESTHESIA/SEDATION: 1.5 mg IV Versed 125 mcg IV Fentanyl Moderate Sedation Time: 30 minutes The patient was continuously monitored during the procedure by the interventional radiology nurse under my direct supervision. COMPLICATIONS: None immediate. TECHNIQUE: Informed written consent was obtained from the patient after a thorough discussion of the procedural risks, benefits and alternatives. All questions were addressed. Specifically, patient was aware of the bleeding risk. Due to patient's IVC thrombus, we elected to perform the procedure with the patient on IV heparin.  Timeout was performed prior to the initiation of the procedure. PROCEDURE: Patient was placed prone on the CT scanner. Images of the pelvis were obtained. The left gluteal region was prepped with chlorhexidine and a sterile field was created. Skin and soft tissues were anesthetized with 1% lidocaine. Using CT guidance, an 18 gauge trocar needle was directed into the pelvic fluid collection from a left transgluteal approach. Purulent bloody fluid was aspirated. A stiff Amplatz wire was advanced into the pelvic collection. Tract was dilated to accommodate a 10 [place] multipurpose drain. Approximately 50 mL of bloody purulent fluid was removed. Specimen was sent for culture. Catheter was sutured to skin. FINDINGS: Again noted are complex pelvic fluid collections compatible with a tubo-ovarian abscess. The caudal aspect of the collection contains fluid. The bloody purulent fluid was removed from this collection. However, there is a more cephalad component containing air-fluid levels. Initially, no air was able to be aspirated. IMPRESSION: CT-guided placement of a drainage catheter within the pelvic fluid collection. Bloody purulent fluid was removed and findings are compatible with a tubo-ovarian abscess. However, the pelvic fluid collection appears to be complex with air-fluid collections in the more cephalad component. Patient will need follow up imaging to see if this drain is able to decompress these other collections. Electronically Signed   By: Markus Daft M.D.   On: 11/17/2017 11:26    Labs:  CBC: Recent Labs    11/17/17 1956 11/18/17 0521 11/19/17 0539 11/20/17 0706  WBC 23.9* 18.9* 15.0* 16.5*  HGB 8.1* 8.1* 7.9* 7.8*  HCT 26.2* 26.8* 26.2* 25.6*  PLT 336 309 373 391    COAGS: Recent Labs    10/02/17 2204 11/16/17 0046  11/16/17 1536 11/16/17 2245 11/17/17 0821 11/17/17 1956  INR 1.97 1.78  --   --   --   --   --  APTT 46* 42*   < > 41* 47* 66* 59*   < > = values in this interval  not displayed.    BMP: Recent Labs    11/17/17 0821 11/18/17 0521 11/19/17 0539 11/20/17 0706  NA 137 138 137 140  K 3.1* 3.1* 3.3* 2.9*  CL 104 105 107 107  CO2 23 22 23 25   GLUCOSE 113* 109* 99 96  BUN 8 9 6  5*  CALCIUM 8.1* 7.8* 7.7* 8.0*  CREATININE 0.75 0.69 0.64 0.62  GFRNONAA >60 >60 >60 >60  GFRAA >60 >60 >60 >60    LIVER FUNCTION TESTS: Recent Labs    03/08/17 1602 08/02/17 1312 10/02/17 0814 11/15/17 1505  BILITOT 0.2 0.5 0.5 0.6  AST 10 15 14* 23  ALT 7 13* 10* 18  ALKPHOS 56 79 44 77  PROT 7.2 7.8 7.2 7.5  ALBUMIN 3.8 3.9 3.3* 2.2*    Assessment and Plan: Pt s/p drainage of left TOA abscess 3/23; afebrile; WBC 16.5(15),  hgb 7.8(7.9) , creat nl; K 2.9- replace; drain fluid cx- gm + cocci; f/u CT A/P today reveals:   Inferior aspect of the left tubo-ovarian abscess has been drained however,larger component does not appear to communicate with drain. This aspect of residual abscess measures 9.6 x 10.8 x 9.9 cm. Small loculated component inferior left lateral aspect.Thrombus within the infrarenal inferior vena cava spanning over 6.6 cm once again noted. Pelvic and left periaortic adenopathy. Bilateral hydronephrosis greater on the left probably caused by the pelvic abnormalities. Markedly enlarged uterus with multiple fibroids some of which are necrotic.Third spacing of fluid. Periumbilical fat, vessel small amount of fluid containing hernia. Enlarged liver spanning over a 22.9 cm.  Imaging studies have been reviewed by Dr. Earleen Newport and he feels additional drain is warranted to hopefully expedite removal of residual pelvic abscess.  Details/risks of procedure, including but not limited to, internal bleeding, infection, injury to adjacent structures, discussed with patient with her understanding and consent.  Procedure tentatively planned for tomorrow.  She remains on IV heparin.     Electronically Signed: D. Rowe Robert, PA-C 11/20/2017, 2:31 PM   I spent a  total of 20 minutes at the the patient's bedside AND on the patient's hospital floor or unit, greater than 50% of which was counseling/coordinating care for pelvic abscess drainage    Patient ID: [name], female   DOB: 12-Aug-1976, 42 y.o.   MRN: [id number]

## 2017-11-21 ENCOUNTER — Inpatient Hospital Stay (HOSPITAL_COMMUNITY): Payer: BC Managed Care – PPO

## 2017-11-21 LAB — BASIC METABOLIC PANEL
ANION GAP: 8 (ref 5–15)
BUN: 6 mg/dL (ref 6–20)
CHLORIDE: 108 mmol/L (ref 101–111)
CO2: 24 mmol/L (ref 22–32)
Calcium: 8.2 mg/dL — ABNORMAL LOW (ref 8.9–10.3)
Creatinine, Ser: 0.62 mg/dL (ref 0.44–1.00)
GFR calc non Af Amer: >60 mL/min (ref >60)
Glucose, Bld: 101 mg/dL — ABNORMAL HIGH (ref 65–99)
Potassium: 3.1 mmol/L — ABNORMAL LOW (ref 3.5–5.1)
Sodium: 140 mmol/L (ref 135–145)

## 2017-11-21 LAB — CBC
HEMATOCRIT: 26.5 % — AB (ref 36.0–46.0)
Hemoglobin: 8 g/dL — ABNORMAL LOW (ref 12.0–15.0)
MCH: 26.3 pg (ref 26.0–34.0)
MCHC: 30.2 g/dL (ref 30.0–36.0)
MCV: 87.2 fL (ref 78.0–100.0)
Platelets: 467 10*3/uL — ABNORMAL HIGH (ref 150–400)
RBC: 3.04 MIL/uL — AB (ref 3.87–5.11)
RDW: 18.4 % — ABNORMAL HIGH (ref 11.5–15.5)
WBC: 15.1 10*3/uL — AB (ref 4.0–10.5)

## 2017-11-21 LAB — PROTIME-INR
INR: 1.23
PROTHROMBIN TIME: 15.4 s — AB (ref 11.4–15.2)

## 2017-11-21 LAB — MAGNESIUM: MAGNESIUM: 1.4 mg/dL — AB (ref 1.7–2.4)

## 2017-11-21 LAB — HEPARIN LEVEL (UNFRACTIONATED): Heparin Unfractionated: 0.31 IU/mL (ref 0.30–0.70)

## 2017-11-21 MED ORDER — SODIUM CHLORIDE 0.9% FLUSH
5.0000 mL | Freq: Three times a day (TID) | INTRAVENOUS | Status: DC
  Administered 2017-11-21 – 2017-11-23 (×6): 5 mL

## 2017-11-21 MED ORDER — MAGNESIUM SULFATE 2 GM/50ML IV SOLN
2.0000 g | Freq: Once | INTRAVENOUS | Status: AC
  Administered 2017-11-21: 2 g via INTRAVENOUS
  Filled 2017-11-21: qty 50

## 2017-11-21 MED ORDER — MIDAZOLAM HCL 2 MG/2ML IJ SOLN
INTRAMUSCULAR | Status: AC | PRN
  Administered 2017-11-21: 1 mg via INTRAVENOUS

## 2017-11-21 MED ORDER — MIDAZOLAM HCL 2 MG/2ML IJ SOLN
INTRAMUSCULAR | Status: AC
  Filled 2017-11-21: qty 4

## 2017-11-21 MED ORDER — POTASSIUM CHLORIDE 10 MEQ/100ML IV SOLN
10.0000 meq | INTRAVENOUS | Status: DC
  Filled 2017-11-21 (×3): qty 100

## 2017-11-21 MED ORDER — POTASSIUM CHLORIDE CRYS ER 10 MEQ PO TBCR
40.0000 meq | EXTENDED_RELEASE_TABLET | Freq: Two times a day (BID) | ORAL | Status: AC
  Administered 2017-11-21 (×2): 40 meq via ORAL
  Filled 2017-11-21: qty 4

## 2017-11-21 MED ORDER — LIDOCAINE HCL (PF) 1 % IJ SOLN
INTRAMUSCULAR | Status: AC | PRN
  Administered 2017-11-21: 30 mL

## 2017-11-21 MED ORDER — FENTANYL CITRATE (PF) 100 MCG/2ML IJ SOLN
INTRAMUSCULAR | Status: AC
  Filled 2017-11-21: qty 2

## 2017-11-21 MED ORDER — FENTANYL CITRATE (PF) 100 MCG/2ML IJ SOLN
INTRAMUSCULAR | Status: AC | PRN
  Administered 2017-11-21: 50 ug via INTRAVENOUS

## 2017-11-21 NOTE — Procedures (Signed)
PELVIC ABSCESS  S/P CT DRAIN LLQ  No comp Stable Exudative fld sent for cx Full report in pacs

## 2017-11-21 NOTE — Progress Notes (Signed)
PROGRESS NOTE    [name]  XLK:[id number] DOB: 07/15/1976 DOA: 11/15/2017 PCP: Venia Carbon, MD   Brief Narrative: 42 year old female with medical history significant for DUB associated with fibroids, antiphospholipid syndrome with multiple DVTs and anemia resented to the emergency department complaining of abdominal pain, fever and chills.  Upon ED evaluation patient was found to be febrile, with severe abdominal pain and CT of the abdomen/Pelvis shows large multiloculated abscess measuring 13.8 x 8.4 x 11.1 cm with surrounding inflammatory concerning for tubo-ovarian abscess, mild bilateral hydronephrosis likely secondary to distal ureteral obstruction due to pelvic pathology.  Hypodense filling defect within the IVC concerning for thrombus.  Mild retroperitoneal and left external iliac lymphadenopathy.  WBC elevated at 24.8, hemoglobin 6.1 and potassium 3.4.  UA grossly abnormal.  Patient was admitted with working diagnosis of sepsis secondary to tubo-ovarian abscess and IVC thrombus complicated with symptomatic anemia.  Patient was started on broad-spectrum antibiotics and IV heparin.  Patient was also transfused PRBCs.  Assessment & Plan:   #Sepsis due to tubo-ovarian abscess: -Patient is currently on doxycycline, Flagyl to complete total 14 days of antibiotics.  Repeat CT scan on 3/26 showed persistence of residual abscess which is really not communicating with the drain.  Patient is going to IR today for further evaluation and possible placement of drain.  Monitor labs, follow-up culture and provide antibiotics for now.  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  Plan to switch to Xarelto on discharge.  Patient will follow up with Dr. Lindi Adie from hematology.  #Symptomatic acute on chronic anemia likely due to blood loss, DUB; hemoglobin is stable.  Continue to monitor.  Recommend to follow-up with GYN on discharge.  #Dysfunctional uterine bleeding from uterine  fibroid: Continue Megace.  Recommend to follow-up with OB/GYN and IR on discharge for further evaluation.  On oral iron  #Hypokalemia and hypomagnesemia: Replete both potassium and magnesium.  Repeat lab in the morning.  #Sinus tachycardia likely reactive due to stress and anemia.  Continue to monitor.  DVT prophylaxis: IV heparin Code Status: Full code Family Communication: No family at bedside Disposition Plan: Likely discharge home in 1-2 days    Consultants:   OB/GYN  IR  Procedures: Abdominal drain placement Antimicrobials: Doxy and Flagyl  Subjective: Seen and examined at bedside.  Reported the pain is a stable.  Denied nausea vomiting chest pain or shortness of breath.  Plan for IR procedure today.  Objective: Vitals:   11/21/17 0459 11/21/17 1216 11/21/17 1220 11/21/17 1225  BP: (!) 135/91 125/73 128/72 121/71  Pulse: (!) 109 (!) 133 (!) 128 (!) 124  Resp: 20 (!) 22 20 (!) 21  Temp: 98.2 F (36.8 C)     TempSrc: Oral     SpO2: 100% 100% 100% 100%  Weight:      Height:        Intake/Output Summary (Last 24 hours) at 11/21/2017 1326 Last data filed at 11/20/2017 2000 Gross per 24 hour  Intake 609.17 ml  Output 5 ml  Net 604.17 ml   Filed Weights   11/16/17 0031  Weight: 92.3 kg (203 lb 7.8 oz)    Examination:  General exam: Appears calm and comfortable  Respiratory system: Clear to auscultation. Respiratory effort normal. No wheezing or crackle Cardiovascular system: S1 & S2 heard, RRR.  No pedal edema. Gastrointestinal system: Abdomen is soft, nontender.  Has drain on left posterior buttock.  Bowel sounds positive Central nervous system: Alert and oriented. No focal neurological  deficits. Extremities: Symmetric 5 x 5 power. Skin: No rashes, lesions or ulcers Psychiatry: Judgement and insight appear normal. Mood & affect appropriate.     Data Reviewed: I have personally reviewed following labs and imaging studies  CBC: Recent Labs  Lab  11/15/17 1110  11/15/17 1505 11/16/17 0806  11/17/17 1956 11/18/17 0521 11/19/17 0539 11/20/17 0706 11/21/17 0526  WBC 21.1*   < > 24.8* 20.8*   < > 23.9* 18.9* 15.0* 16.5* 15.1*  NEUTROABS 16.5*  --  19.1* 17.3*  --   --  15.2*  --   --   --   HGB  --    < > 6.1* 7.3*   < > 8.1* 8.1* 7.9* 7.8* 8.0*  HCT 20.7*  --  20.6* 23.5*   < > 26.2* 26.8* 26.2* 25.6* 26.5*  MCV 79.5  --  83.1 83.0   < > 85.1 84.8 85.6 85.0 87.2  PLT 291   < > 306 264   < > 336 309 373 391 467*   < > = values in this interval not displayed.   Basic Metabolic Panel: Recent Labs  Lab 11/17/17 0821 11/18/17 0521 11/19/17 0539 11/20/17 0706 11/21/17 0526  NA 137 138 137 140 140  K 3.1* 3.1* 3.3* 2.9* 3.1*  CL 104 105 107 107 108  CO2 23 22 23 25 24   GLUCOSE 113* 109* 99 96 101*  BUN 8 9 6  5* 6  CREATININE 0.75 0.69 0.64 0.62 0.62  CALCIUM 8.1* 7.8* 7.7* 8.0* 8.2*  MG  --  1.7 1.6* 1.2* 1.4*   GFR: Estimated Creatinine Clearance: 110.8 mL/min (by C-G formula based on SCr of 0.62 mg/dL). Liver Function Tests: Recent Labs  Lab 11/15/17 1505  AST 23  ALT 18  ALKPHOS 77  BILITOT 0.6  PROT 7.5  ALBUMIN 2.2*   Recent Labs  Lab 11/15/17 1505  LIPASE 14   No results for input(s): AMMONIA in the last 168 hours. Coagulation Profile: Recent Labs  Lab 11/16/17 0046 11/21/17 0526  INR 1.78 1.23   Cardiac Enzymes: No results for input(s): CKTOTAL, CKMB, CKMBINDEX, TROPONINI in the last 168 hours. BNP (last 3 results) No results for input(s): PROBNP in the last 8760 hours. HbA1C: No results for input(s): HGBA1C in the last 72 hours. CBG: No results for input(s): GLUCAP in the last 168 hours. Lipid Profile: No results for input(s): CHOL, HDL, LDLCALC, TRIG, CHOLHDL, LDLDIRECT in the last 72 hours. Thyroid Function Tests: No results for input(s): TSH, T4TOTAL, FREET4, T3FREE, THYROIDAB in the last 72 hours. Anemia Panel: No results for input(s): VITAMINB12, FOLATE, FERRITIN, TIBC, IRON,  RETICCTPCT in the last 72 hours. Sepsis Labs: Recent Labs  Lab 11/15/17 1522 11/16/17 0046  PROCALCITON  --  46.61  LATICACIDVEN 1.42  --     Recent Results (from the past 240 hour(s))  Blood Culture (routine x 2)     Status: None   Collection Time: 11/15/17  3:06 PM  Result Value Ref Range Status   Specimen Description   Final    BLOOD RIGHT ANTECUBITAL Performed at [facility] [address], [place], [place] [address]    Special Requests   Final    BOTTLES DRAWN AEROBIC AND ANAEROBIC Blood Culture adequate volume Performed at [facility] [address] [place], [place] [address]    Culture   Final    NO GROWTH 5 DAYS Performed at [facility], [place],  [place] [address]    Report Status 11/20/2017 FINAL  Final  Blood Culture (routine x 2)     Status: None   Collection Time: 11/15/17  3:06 PM  Result Value Ref Range Status   Specimen Description   Final    BLOOD LEFT ANTECUBITAL Performed at [facility] [address] [place], [place] [address]    Special Requests   Final    BOTTLES DRAWN AEROBIC AND ANAEROBIC Blood Culture adequate volume Performed at [facility] [address] [place], [place] [address]    Culture   Final    NO GROWTH 5 DAYS Performed at [facility], [place] [address] [place], [place] [address]    Report Status 11/20/2017 FINAL  Final  Urine culture     Status: Abnormal   Collection Time: 11/15/17  5:38 PM  Result Value Ref Range Status   Specimen Description   Final    URINE, RANDOM Performed at [facility] [address], [place], [place] [address]    Special Requests   Final    NONE Performed at [facility], [place] [address], [place], [place] [address]    Culture MULTIPLE SPECIES PRESENT, SUGGEST RECOLLECTION (A)  Final   Report Status 11/17/2017 FINAL  Final  MRSA PCR Screening      Status: None   Collection Time: 11/16/17 12:28 AM  Result Value Ref Range Status   MRSA by PCR NEGATIVE NEGATIVE Final    Comment:        The GeneXpert MRSA Assay (FDA approved for NASAL specimens only), is one component of a comprehensive MRSA colonization surveillance program. It is not intended to diagnose MRSA infection nor to guide or monitor treatment for MRSA infections. Performed at [facility], [place] [address] [place], [place] [address]   Aerobic/Anaerobic Culture (surgical/deep wound)     Status: None (Preliminary result)   Collection Time: 11/17/17 10:39 AM  Result Value Ref Range Status   Specimen Description   Final    ABSCESS PELVIS Performed at [facility], [place] [address] [place], [place] [address]    Special Requests   Final    NONE Performed at [facility], [place] [address] [place], [place] [address]    Gram Stain   Final    ABUNDANT WBC PRESENT, PREDOMINANTLY PMN ABUNDANT GRAM POSITIVE COCCI IN PAIRS    Culture   Final    NO GROWTH 4 DAYS NO ANAEROBES ISOLATED; CULTURE IN PROGRESS FOR 5 DAYS Performed at [facility] [address] [place], [place] [address]    Report Status PENDING  Incomplete         Radiology Studies: Ct Abdomen Pelvis W Contrast  Result Date: 11/20/2017 CLINICAL DATA:  42 year old female with tubo-ovarian abscess post drainage. Continued lower abdominal pain. Subsequent encounter. EXAM: CT ABDOMEN AND PELVIS WITH CONTRAST TECHNIQUE: Multidetector CT imaging of the abdomen and pelvis was performed using the standard protocol following bolus administration of intravenous contrast. CONTRAST:  127m ISOVUE-300 IOPAMIDOL (ISOVUE-300) INJECTION 61% COMPARISON:  11/17/2017 and 11/15/2017 CT.  10/22/2017 MR. FINDINGS: Lower chest: Mild basilar atelectasis. Heart size within normal limits. Dense breast parenchyma. Hepatobiliary: Enlarged liver spanning over 22.9 cm. No worrisome hepatic  lesion. No calcified gallstone. Pancreas: No pancreatic mass or primary pancreatic inflammation. Spleen: Top normal size spleen without splenic mass. Adrenals/Urinary Tract: Mild bilateral hydronephrosis greater on left. No worrisome renal or adrenal lesion.  Noncontrast filled views of the urinary bladder without primary urinary bladder abnormality. Stomach/Bowel: Bowel displaced by pelvic abnormalities as discussed below. No obvious primary bowel abnormality. Bowel extends towards periumbilical hernia but does not enter such. 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 Adenopathy pelvis and left periaortic region. Reproductive: Left tubo-ovarian abscess partially drain with catheter placed left transgluteal position. Suspect that this drain will not adequately treat tubo-ovarian abscess which appears loculated with large residual component anterior to the drain measuring 9.6 x 10.8 x 9.9 cm. Small loculated component inferior left lateral aspect. Markedly enlarged uterus with superior extension displacement of surrounding structures containing multiple fibroids some of which may be necrotic. Other: Third spacing of fluid. Periumbilical fat, vessel and small amount of fluid containing hernia. Musculoskeletal: Bilateral sacroiliac joint degenerative changes. IMPRESSION: Left transgluteal catheter placed for treatment of left tubo-ovarian abscess. Inferior aspect of the abscess has been drained however, larger component does not appear to communicate with drain. This aspect of residual abscess measures 9.6 x 10.8 x 9.9 cm. Small loculated component inferior left lateral aspect. 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 Pelvic and left periaortic adenopathy. Bilateral hydronephrosis greater on the left probably caused by the pelvic abnormalities. Markedly enlarged uterus with multiple fibroids some of which are  necrotic. Third spacing of fluid. Periumbilical fat, vessel small amount of fluid containing hernia. Enlarged liver spanning over a 22.9 cm. Images reviewed with radiology interventional service. Electronically Signed   By: Genia Del M.D.   On: 11/20/2017 14:26        Scheduled Meds: . doxycycline  100 mg Oral Q12H  . fentaNYL      . ferrous sulfate  325 mg Oral Daily  . megestrol  80 mg Oral BID  . metroNIDAZOLE  500 mg Oral Q8H  . midazolam      . polyethylene glycol  17 g Oral Daily  . potassium chloride  40 mEq Oral BID  . sodium chloride flush  3 mL Intravenous Q12H  . sodium chloride flush  5 mL Intracatheter Q8H  . sodium chloride flush  5 mL Intracatheter Q8H   Continuous Infusions: . heparin 2,500 Units/hr (11/21/17 0934)     LOS: 6 days    Konni Kesinger Tanna Furry, MD [facility] Pager [phone]  If 7PM-7AM, please contact night-coverage [email or web address] Password [email or web address] 11/21/2017, 1:26 PM

## 2017-11-21 NOTE — Progress Notes (Signed)
ANTICOAGULATION CONSULT NOTE  Pharmacy Consult for heparin Indication: IVC thrombosis  No Known Allergies  Patient Measurements: Height: 5' 9"  (175.3 cm) Weight: 203 lb 7.8 oz (92.3 kg) IBW/kg (Calculated) : 66.2 Heparin Dosing Weight: 85.6 kg  Vital Signs: Temp: 98.2 F (36.8 C) (03/27 0459) Temp Source: Oral (03/27 0459) BP: 135/91 (03/27 0459) Pulse Rate: 109 (03/27 0459)  Labs: Recent Labs    11/19/17 0539 11/19/17 0850 11/20/17 0706 11/21/17 0526  HGB 7.9*  --  7.8* 8.0*  HCT 26.2*  --  25.6* 26.5*  PLT 373  --  391 467*  LABPROT  --   --   --  15.4*  INR  --   --   --  1.23  HEPARINUNFRC  --  0.40 0.36 0.31  CREATININE 0.64  --  0.62 0.62    Estimated Creatinine Clearance: 110.8 mL/min (by C-G formula based on SCr of 0.62 mg/dL).  Medications:  Medications Prior to Admission  Medication Sig Dispense Refill Last Dose  . acetaminophen (TYLENOL) 500 MG tablet Take 1,000 mg by mouth every 6 (six) hours as needed for fever.   11/14/2017 at Unknown time  . Ferrous Sulfate (IRON) 325 (65 Fe) MG TABS Take 1 tablet (325 mg total) by mouth daily. 30 each 11 11/14/2017 at Unknown time  . megestrol (MEGACE) 40 MG tablet Take 2 tablets (80 mg total) by mouth 2 (two) times daily. 120 tablet 5 11/14/2017 at Unknown time  . Multiple Vitamin (MULTIVITAMIN WITH MINERALS) TABS tablet Take 1 tablet by mouth daily.   Past Week at Unknown time  . traMADol (ULTRAM) 50 MG tablet Take 1 tablet (50 mg total) by mouth 3 (three) times daily as needed. (Patient taking differently: Take 50 mg by mouth 3 (three) times daily as needed for moderate pain. ) 30 tablet 0 11/15/2017 at Unknown time  . XARELTO 20 MG TABS tablet TAKE 1 TABLET (20 MG TOTAL) BY MOUTH DAILY WITH SUPPER. 30 tablet 11 11/14/2017 at 0900 pm    Assessment: 42 yo on Xarelto PTA for antiphospholipid syndrome with multiple clots. 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 Xarelto 20 qd PTA LD 3/20 at 2100. Summer aPTT 42,  Summer HL 1.04. Summer INR 1.79 (elevated 2nd xarelto). May need IVC filter.   11/21/2017   Heparin level 0.31, therapeutic  Hgb 8 low but stable, plt WNL  No bleeding or infusion issues per RN  Goal of Therapy:  Heparin level 0.3-0.7 units/ml aPTT 66-102 seconds Monitor platelets by anticoagulation protocol: Yes   Plan:   Continue heparin at 2500 units/hr   Daily HL & CBC  F/U plans to resume Xarelto once no more procedures needed   Netta Cedars, PharmD, BCPS Pager: [phone] 11/21/2017, 8:16 AM

## 2017-11-22 DIAGNOSIS — E440 Moderate protein-calorie malnutrition: Secondary | ICD-10-CM

## 2017-11-22 LAB — BASIC METABOLIC PANEL
Anion gap: 8 (ref 5–15)
BUN: 6 mg/dL (ref 6–20)
CO2: 23 mmol/L (ref 22–32)
Calcium: 8.3 mg/dL — ABNORMAL LOW (ref 8.9–10.3)
Chloride: 113 mmol/L — ABNORMAL HIGH (ref 101–111)
Creatinine, Ser: 0.66 mg/dL (ref 0.44–1.00)
GFR calc Af Amer: >60 mL/min (ref >60)
GFR calc non Af Amer: >60 mL/min (ref >60)
GLUCOSE: 99 mg/dL (ref 65–99)
POTASSIUM: 3.6 mmol/L (ref 3.5–5.1)
Sodium: 144 mmol/L (ref 135–145)

## 2017-11-22 LAB — CBC
HCT: 26.8 % — ABNORMAL LOW (ref 36.0–46.0)
HEMOGLOBIN: 8 g/dL — AB (ref 12.0–15.0)
MCH: 26.2 pg (ref 26.0–34.0)
MCHC: 29.9 g/dL — AB (ref 30.0–36.0)
MCV: 87.9 fL (ref 78.0–100.0)
PLATELETS: 419 10*3/uL — AB (ref 150–400)
RBC: 3.05 MIL/uL — ABNORMAL LOW (ref 3.87–5.11)
RDW: 18.9 % — AB (ref 11.5–15.5)
WBC: 10.2 10*3/uL (ref 4.0–10.5)

## 2017-11-22 LAB — MAGNESIUM: MAGNESIUM: 1.8 mg/dL (ref 1.7–2.4)

## 2017-11-22 LAB — HEPARIN LEVEL (UNFRACTIONATED): HEPARIN UNFRACTIONATED: 0.35 [IU]/mL (ref 0.30–0.70)

## 2017-11-22 MED ORDER — BOOST / RESOURCE BREEZE PO LIQD CUSTOM
1.0000 | Freq: Two times a day (BID) | ORAL | Status: DC
  Administered 2017-11-22 – 2017-11-23 (×3): 1 via ORAL

## 2017-11-22 NOTE — Progress Notes (Signed)
Referring Physician(s): Constant,P  Supervising Physician: Aletta Edouard  Patient Status:  [facility] - In-pt  Chief Complaint: Pelvic pain, tubo-ovarian abscess     Subjective: Patient doing okay today.  Feeling a little better.  Still sore at pelvic drain insertion sites; tolerating diet okay.  No significant increase in vaginal bleeding.   Allergies: Patient has no known allergies.  Medications: Prior to Admission medications   Medication Sig Start Date End Date Taking? Authorizing Provider  acetaminophen (TYLENOL) 500 MG tablet Take 1,000 mg by mouth every 6 (six) hours as needed for fever.   Yes [provider]  Ferrous Sulfate (IRON) 325 (65 Fe) MG TABS Take 1 tablet (325 mg total) by mouth daily. 10/22/17  Yes Venia Carbon, MD  megestrol (MEGACE) 40 MG tablet Take 2 tablets (80 mg total) by mouth 2 (two) times daily. 10/08/17  Yes Anyanwu, Sallyanne Havers, MD  Multiple Vitamin (MULTIVITAMIN WITH MINERALS) TABS tablet Take 1 tablet by mouth daily.   Yes [provider]  traMADol (ULTRAM) 50 MG tablet Take 1 tablet (50 mg total) by mouth 3 (three) times daily as needed. Patient taking differently: Take 50 mg by mouth 3 (three) times daily as needed for moderate pain.  11/12/17  Yes Venia Carbon, MD  XARELTO 20 MG TABS tablet TAKE 1 TABLET (20 MG TOTAL) BY MOUTH DAILY WITH SUPPER. 09/13/17  Yes Venia Carbon, MD     Vital Signs: BP 138/80 (BP Location: Left Arm)   Pulse (!) 110   Temp 98.1 F (36.7 C) (Oral)   Resp 18   Ht 5' 9"  (1.753 m)   Wt 203 lb 7.8 oz (92.3 kg)   SpO2 100%   BMI 30.05 kg/m   Physical Exam left lower quadrant and transgluteal drains intact, small amount of bloody fluid in both.  Cultures pending  Imaging: Ct Abdomen Pelvis W Contrast  Result Date: 11/20/2017 CLINICAL DATA:  42 year old female with tubo-ovarian abscess post drainage. Continued lower abdominal pain. Subsequent encounter. EXAM: CT ABDOMEN AND PELVIS WITH  CONTRAST TECHNIQUE: Multidetector CT imaging of the abdomen and pelvis was performed using the standard protocol following bolus administration of intravenous contrast. CONTRAST:  127m ISOVUE-300 IOPAMIDOL (ISOVUE-300) INJECTION 61% COMPARISON:  11/17/2017 and 11/15/2017 CT.  10/22/2017 MR. FINDINGS: Lower chest: Mild basilar atelectasis. Heart size within normal limits. Dense breast parenchyma. Hepatobiliary: Enlarged liver spanning over 22.9 cm. No worrisome hepatic lesion. No calcified gallstone. Pancreas: No pancreatic mass or primary pancreatic inflammation. Spleen: Top normal size spleen without splenic mass. Adrenals/Urinary Tract: Mild bilateral hydronephrosis greater on left. No worrisome renal or adrenal lesion. Noncontrast filled views of the urinary bladder without primary urinary bladder abnormality. Stomach/Bowel: Bowel displaced by pelvic abnormalities as discussed below. No obvious primary bowel abnormality. Bowel extends towards periumbilical hernia but does not enter such. 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 Adenopathy pelvis and left periaortic region. Reproductive: Left tubo-ovarian abscess partially drain with catheter placed left transgluteal position. Suspect that this drain will not adequately treat tubo-ovarian abscess which appears loculated with large residual component anterior to the drain measuring 9.6 x 10.8 x 9.9 cm. Small loculated component inferior left lateral aspect. Markedly enlarged uterus with superior extension displacement of surrounding structures containing multiple fibroids some of which may be necrotic. Other: Third spacing of fluid. Periumbilical fat, vessel and small amount of fluid containing hernia. Musculoskeletal: Bilateral sacroiliac joint degenerative changes. IMPRESSION: Left transgluteal catheter placed for  treatment of left tubo-ovarian abscess. Inferior aspect of the abscess has been drained  however, larger component does not appear to communicate with drain. This aspect of residual abscess measures 9.6 x 10.8 x 9.9 cm. Small loculated component inferior left lateral aspect. 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 Pelvic and left periaortic adenopathy. Bilateral hydronephrosis greater on the left probably caused by the pelvic abnormalities. Markedly enlarged uterus with multiple fibroids some of which are necrotic. Third spacing of fluid. Periumbilical fat, vessel small amount of fluid containing hernia. Enlarged liver spanning over a 22.9 cm. Images reviewed with radiology interventional service. Electronically Signed   By: Genia Del M.D.   On: 11/20/2017 14:26   Ct Image Guided Drainage By Percutaneous Catheter  Result Date: 11/21/2017 INDICATION: Large tubo-ovarian abscess EXAM: CT GUIDED DRAINAGE OF LEFT LOWER QUADRANT TUBO-OVARIAN ABSCESS MEDICATIONS: The patient is currently admitted to the hospital and receiving intravenous antibiotics. The antibiotics were administered within an appropriate time frame prior to the initiation of the procedure. ANESTHESIA/SEDATION: 1.0 mg IV Versed 50 mcg IV Fentanyl Moderate Sedation Time:  16 MINUTES The patient was continuously monitored during the procedure by the interventional radiology nurse under my direct supervision. COMPLICATIONS: None immediate. TECHNIQUE: Informed written consent was obtained from the patient after a thorough discussion of the procedural risks, benefits and alternatives. All questions were addressed. Maximal Sterile Barrier Technique was utilized including caps, mask, sterile gowns, sterile gloves, sterile drape, hand hygiene and skin antiseptic. A timeout was performed prior to the initiation of the procedure. PROCEDURE: previous imaging reviewed. patient positioned supine. noncontrast localization CT performed. The large left pelvic tubo-ovarian abscess was localized and  marked for a left lower quadrant anterolateral approach. Overlying skin marked. Under sterile conditions and local anesthesia, an 18 gauge 10 cm access needle was advanced from a left lower quadrant anterolateral approach into the air-fluid collection. Needle position confirmed with CT. Syringe aspiration yielded exudative fluid. Tract dilatation performed over Amplatz guidewire to insert a 10 [place] drain. Drain catheter position confirmed with CT. Catheter secured with a Prolene suture and connected to external gravity drainage bag. No immediate complication. Patient tolerated the procedure well. FINDINGS: Imaging confirms percutaneous needle access of the pelvic tubo-ovarian abscess for drain insertion IMPRESSION: Successful CT-guided left lower quadrant pelvic abscess drain insertion as above. Electronically Signed   By: Jerilynn Mages.  Shick M.D.   On: 11/21/2017 13:26    Labs:  CBC: Recent Labs    11/19/17 0539 11/20/17 0706 11/21/17 0526 11/22/17 0547  WBC 15.0* 16.5* 15.1* 10.2  HGB 7.9* 7.8* 8.0* 8.0*  HCT 26.2* 25.6* 26.5* 26.8*  PLT 373 391 467* 419*    COAGS: Recent Labs    10/02/17 2204 11/16/17 0046  11/16/17 1536 11/16/17 2245 11/17/17 0821 11/17/17 1956 11/21/17 0526  INR 1.97 1.78  --   --   --   --   --  1.23  APTT 46* 42*   < > 41* 47* 66* 59*  --    < > = values in this interval not displayed.    BMP: Recent Labs    11/19/17 0539 11/20/17 0706 11/21/17 0526 11/22/17 0547  NA 137 140 140 144  K 3.3* 2.9* 3.1* 3.6  CL 107 107 108 113*  CO2 23 25 24 23   GLUCOSE 99 96 101* 99  BUN 6 5* 6 6  CALCIUM 7.7* 8.0* 8.2* 8.3*  CREATININE 0.64 0.62 0.62 0.66  GFRNONAA >60 >60 >  60 >60  GFRAA >60 >60 >60 >60    LIVER FUNCTION TESTS: Recent Labs    03/08/17 1602 08/02/17 1312 10/02/17 0814 11/15/17 1505  BILITOT 0.2 0.5 0.5 0.6  AST 10 15 14* 23  ALT 7 13* 10* 18  ALKPHOS 56 79 44 77  PROT 7.2 7.8 7.2 7.5  ALBUMIN 3.8 3.9 3.3* 2.2*    Assessment and  Plan: Patient status post drainage of left tubo-ovarian abscess on 3/23 and additional left lower quadrant drain placement on 3/27 for residual fluid.  Afebrile, creatinine and WBC normal, hemoglobin 8; drain fluid cultures pending ;minimal output from transgluteal drain which was subsequently removed in its entirety following discussions with Dr. Kathlene Cote.  Maintain left lower quadrant drain for now and continue normal saline flushes.  Monitor output.  Can follow up with patient in IR clinic upon discharge (would contact Dr. Earleen Newport at [phone] with any additional questions regarding possible future uterine fibroid embolization or anticoagulation transitions pre procedure but not likely to occur until after current infection completely resolves)   Electronically Signed: D. Rowe Robert, PA-C 11/22/2017, 5:40 PM   I spent a total of 15 minutes at the the patient's bedside AND on the patient's hospital floor or unit, greater than 50% of which was counseling/coordinating care for pelvic fluid collection drain    Patient ID: [name], female   DOB: 09-18-1975, 42 y.o.   MRN: [id number]

## 2017-11-22 NOTE — Progress Notes (Signed)
PROGRESS NOTE    [name]  GYF:[id number] DOB: 1975/10/01 DOA: 11/15/2017 PCP: Venia Carbon, MD   Brief Narrative: 42 year old female with medical history significant for DUB associated with fibroids, antiphospholipid syndrome with multiple DVTs and anemia resented to the emergency department complaining of abdominal pain, fever and chills.  Upon ED evaluation patient was found to be febrile, with severe abdominal pain and CT of the abdomen/Pelvis shows large multiloculated abscess measuring 13.8 x 8.4 x 11.1 cm with surrounding inflammatory concerning for tubo-ovarian abscess, mild bilateral hydronephrosis likely secondary to distal ureteral obstruction due to pelvic pathology.  Hypodense filling defect within the IVC concerning for thrombus.  Mild retroperitoneal and left external iliac lymphadenopathy.  WBC elevated at 24.8, hemoglobin 6.1 and potassium 3.4.  UA grossly abnormal.  Patient was admitted with working diagnosis of sepsis secondary to tubo-ovarian abscess and IVC thrombus complicated with symptomatic anemia.  Patient was started on broad-spectrum antibiotics and IV heparin.  Patient was also transfused PRBCs.  Assessment & Plan:   #Sepsis due to tubo-ovarian abscess: -Patient is currently on doxycycline, Flagyl to complete total 14 days of antibiotics.  Repeat CT scan on 3/26 showed persistence of residual abscess which is really not communicating with the drain.  Status post all other left pelvic drain placement by IR yesterday.  Follow-up culture results.  Leukocytosis trending down.  Pain is improving.  Follow-up the final plan of IR.    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  Plan to switch to Xarelto on discharge.  Patient will follow up with Dr. Lindi Adie from hematology. -Waiting from IR regarding further plan before switching to oral anticoagulation.  Discussed with the patient today.  No sign of bleeding.  #Symptomatic acute on chronic anemia likely  due to blood loss, DUB; hemoglobin is stable at 8.  Minimize blood draws.  Continue to monitor.  Recommend to follow-up with GYN on discharge.  #Dysfunctional uterine bleeding from uterine fibroid: Continue Megace.  Recommend to follow-up with OB/GYN and IR on discharge for further evaluation.  On oral iron  #Hypokalemia and hypomagnesemia: Lab improved today.   #Sinus tachycardia likely reactive due to stress and anemia.  Continue to monitor.  Check TSH level.  DVT prophylaxis: IV heparin Code Status: Full code Family Communication: No family at bedside Disposition Plan: Likely discharge home in 1-2 days    Consultants:   OB/GYN  IR  Procedures: Abdominal drain placement Antimicrobials: Doxy and Flagyl  Subjective: Seen and examined at bedside.  Had another drain placed by IR yesterday.  Reported the pain is better.  Denies headache, dizziness, nausea vomiting.  Patient's mother at bedside.  Objective: Vitals:   11/21/17 1225 11/21/17 1412 11/21/17 2022 11/22/17 0508  BP: 121/71 126/89 122/67 125/87  Pulse: (!) 124 (!) 123 (!) 111 (!) 105  Resp: (!) 21 19 20 18   Temp:  98.5 F (36.9 C) 98.1 F (36.7 C) 98.4 F (36.9 C)  TempSrc:  Oral Oral Oral  SpO2: 100% 97% 97% 100%  Weight:      Height:        Intake/Output Summary (Last 24 hours) at 11/22/2017 1343 Last data filed at 11/22/2017 0757 Gross per 24 hour  Intake 1055.83 ml  Output -  Net 1055.83 ml   Filed Weights   11/16/17 0031  Weight: 92.3 kg (203 lb 7.8 oz)    Examination:  General exam: Not in distress, pleasant and looks comfortable Respiratory system: Clear bilateral, respiratory effort normal Cardiovascular system:  S1 & S2 heard, RRR.  No pedal edema. Gastrointestinal system: Abdomen soft, nontender.  Bowel sounds positive.  Has left pelvic and left posterior buttock drain. Central nervous system: Alert and oriented. No focal neurological deficits. Extremities: Symmetric 5 x 5 power. Skin: No  rashes, lesions or ulcers Psychiatry: Judgement and insight appear normal. Mood & affect appropriate.     Data Reviewed: I have personally reviewed following labs and imaging studies  CBC: Recent Labs  Lab 11/15/17 1505 11/16/17 0806  11/18/17 0521 11/19/17 0539 11/20/17 0706 11/21/17 0526 11/22/17 0547  WBC 24.8* 20.8*   < > 18.9* 15.0* 16.5* 15.1* 10.2  NEUTROABS 19.1* 17.3*  --  15.2*  --   --   --   --   HGB 6.1* 7.3*   < > 8.1* 7.9* 7.8* 8.0* 8.0*  HCT 20.6* 23.5*   < > 26.8* 26.2* 25.6* 26.5* 26.8*  MCV 83.1 83.0   < > 84.8 85.6 85.0 87.2 87.9  PLT 306 264   < > 309 373 391 467* 419*   < > = values in this interval not displayed.   Basic Metabolic Panel: Recent Labs  Lab 11/18/17 0521 11/19/17 0539 11/20/17 0706 11/21/17 0526 11/22/17 0547  NA 138 137 140 140 144  K 3.1* 3.3* 2.9* 3.1* 3.6  CL 105 107 107 108 113*  CO2 22 23 25 24 23   GLUCOSE 109* 99 96 101* 99  BUN 9 6 5* 6 6  CREATININE 0.69 0.64 0.62 0.62 0.66  CALCIUM 7.8* 7.7* 8.0* 8.2* 8.3*  MG 1.7 1.6* 1.2* 1.4* 1.8   GFR: Estimated Creatinine Clearance: 110.8 mL/min (by C-G formula based on SCr of 0.66 mg/dL). Liver Function Tests: Recent Labs  Lab 11/15/17 1505  AST 23  ALT 18  ALKPHOS 77  BILITOT 0.6  PROT 7.5  ALBUMIN 2.2*   Recent Labs  Lab 11/15/17 1505  LIPASE 14   No results for input(s): AMMONIA in the last 168 hours. Coagulation Profile: Recent Labs  Lab 11/16/17 0046 11/21/17 0526  INR 1.78 1.23   Cardiac Enzymes: No results for input(s): CKTOTAL, CKMB, CKMBINDEX, TROPONINI in the last 168 hours. BNP (last 3 results) No results for input(s): PROBNP in the last 8760 hours. HbA1C: No results for input(s): HGBA1C in the last 72 hours. CBG: No results for input(s): GLUCAP in the last 168 hours. Lipid Profile: No results for input(s): CHOL, HDL, LDLCALC, TRIG, CHOLHDL, LDLDIRECT in the last 72 hours. Thyroid Function Tests: No results for input(s): TSH, T4TOTAL,  FREET4, T3FREE, THYROIDAB in the last 72 hours. Anemia Panel: No results for input(s): VITAMINB12, FOLATE, FERRITIN, TIBC, IRON, RETICCTPCT in the last 72 hours. Sepsis Labs: Recent Labs  Lab 11/15/17 1522 11/16/17 0046  PROCALCITON  --  46.61  LATICACIDVEN 1.42  --     Recent Results (from the past 240 hour(s))  Blood Culture (routine x 2)     Status: None   Collection Time: 11/15/17  3:06 PM  Result Value Ref Range Status   Specimen Description   Final    BLOOD RIGHT ANTECUBITAL Performed at [facility] [address] [place], [place] [address]    Special Requests   Final    BOTTLES DRAWN AEROBIC AND ANAEROBIC Blood Culture adequate volume Performed at [facility] [address] [place], [place] [address]    Culture   Final    NO GROWTH 5 DAYS Performed at [facility], [place]  [address] [place], [place] [address]    Report Status 11/20/2017 FINAL  Final  Blood Culture (routine x 2)     Status: None   Collection Time: 11/15/17  3:06 PM  Result Value Ref Range Status   Specimen Description   Final    BLOOD LEFT ANTECUBITAL Performed at [facility] [address] [place], [place] [address]    Special Requests   Final    BOTTLES DRAWN AEROBIC AND ANAEROBIC Blood Culture adequate volume Performed at [facility] [address] [place], [place] [address]    Culture   Final    NO GROWTH 5 DAYS Performed at [facility], [place] [address], [place], [place] [address]    Report Status 11/20/2017 FINAL  Final  Urine culture     Status: Abnormal   Collection Time: 11/15/17  5:38 PM  Result Value Ref Range Status   Specimen Description   Final    URINE, RANDOM Performed at [facility] [address] [place], [place] [address]    Special Requests   Final    NONE Performed at [facility], [place] [address] [place], [place] [address]     Culture MULTIPLE SPECIES PRESENT, SUGGEST RECOLLECTION (A)  Final   Report Status 11/17/2017 FINAL  Final  MRSA PCR Screening     Status: None   Collection Time: 11/16/17 12:28 AM  Result Value Ref Range Status   MRSA by PCR NEGATIVE NEGATIVE Final    Comment:        The GeneXpert MRSA Assay (FDA approved for NASAL specimens only), is one component of a comprehensive MRSA colonization surveillance program. It is not intended to diagnose MRSA infection nor to guide or monitor treatment for MRSA infections. Performed at [facility], [place] [address] [place], [place] [address]   Aerobic/Anaerobic Culture (surgical/deep wound)     Status: None (Preliminary result)   Collection Time: 11/17/17 10:39 AM  Result Value Ref Range Status   Specimen Description   Final    ABSCESS PELVIS Performed at [facility], [place] [address] [place], [place] [address]    Special Requests   Final    NONE Performed at [facility], [place] [address], [place], [place] [address]    Gram Stain   Final    ABUNDANT WBC PRESENT, PREDOMINANTLY PMN ABUNDANT GRAM POSITIVE COCCI IN PAIRS    Culture   Final    NO GROWTH 5 DAYS NO ANAEROBES ISOLATED; CULTURE IN PROGRESS FOR 5 DAYS Performed at [facility] [address], [place], [place] [address]    Report Status PENDING  Incomplete  Aerobic/Anaerobic Culture (surgical/deep wound)     Status: None (Preliminary result)   Collection Time: 11/21/17 12:38 PM  Result Value Ref Range Status   Specimen Description ABSCESS PELVIC  Final   Special Requests NONE  Final   Gram Stain   Final    ABUNDANT WBC PRESENT,BOTH PMN AND MONONUCLEAR MODERATE GRAM POSITIVE COCCI Performed at [facility], [address], [place], [place] [address]    Culture PENDING  Incomplete   Report Status PENDING  Incomplete         Radiology Studies: Ct Abdomen Pelvis W Contrast  Result Date: 11/20/2017 CLINICAL DATA:   42 year old female with tubo-ovarian abscess post drainage. Continued lower abdominal pain. Subsequent encounter. EXAM: CT ABDOMEN AND PELVIS WITH CONTRAST TECHNIQUE: Multidetector CT imaging of the  abdomen and pelvis was performed using the standard protocol following bolus administration of intravenous contrast. CONTRAST:  170m ISOVUE-300 IOPAMIDOL (ISOVUE-300) INJECTION 61% COMPARISON:  11/17/2017 and 11/15/2017 CT.  10/22/2017 MR. FINDINGS: Lower chest: Mild basilar atelectasis. Heart size within normal limits. Dense breast parenchyma. Hepatobiliary: Enlarged liver spanning over 22.9 cm. No worrisome hepatic lesion. No calcified gallstone. Pancreas: No pancreatic mass or primary pancreatic inflammation. Spleen: Top normal size spleen without splenic mass. Adrenals/Urinary Tract: Mild bilateral hydronephrosis greater on left. No worrisome renal or adrenal lesion. Noncontrast filled views of the urinary bladder without primary urinary bladder abnormality. Stomach/Bowel: Bowel displaced by pelvic abnormalities as discussed below. No obvious primary bowel abnormality. Bowel extends towards periumbilical hernia but does not enter such. 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 Adenopathy pelvis and left periaortic region. Reproductive: Left tubo-ovarian abscess partially drain with catheter placed left transgluteal position. Suspect that this drain will not adequately treat tubo-ovarian abscess which appears loculated with large residual component anterior to the drain measuring 9.6 x 10.8 x 9.9 cm. Small loculated component inferior left lateral aspect. Markedly enlarged uterus with superior extension displacement of surrounding structures containing multiple fibroids some of which may be necrotic. Other: Third spacing of fluid. Periumbilical fat, vessel and small amount of fluid containing hernia. Musculoskeletal: Bilateral sacroiliac joint degenerative  changes. IMPRESSION: Left transgluteal catheter placed for treatment of left tubo-ovarian abscess. Inferior aspect of the abscess has been drained however, larger component does not appear to communicate with drain. This aspect of residual abscess measures 9.6 x 10.8 x 9.9 cm. Small loculated component inferior left lateral aspect. 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 Pelvic and left periaortic adenopathy. Bilateral hydronephrosis greater on the left probably caused by the pelvic abnormalities. Markedly enlarged uterus with multiple fibroids some of which are necrotic. Third spacing of fluid. Periumbilical fat, vessel small amount of fluid containing hernia. Enlarged liver spanning over a 22.9 cm. Images reviewed with radiology interventional service. Electronically Signed   By: SGenia DelM.D.   On: 11/20/2017 14:26   Ct Image Guided Drainage By Percutaneous Catheter  Result Date: 11/21/2017 INDICATION: Large tubo-ovarian abscess EXAM: CT GUIDED DRAINAGE OF LEFT LOWER QUADRANT TUBO-OVARIAN ABSCESS MEDICATIONS: The patient is currently admitted to the hospital and receiving intravenous antibiotics. The antibiotics were administered within an appropriate time frame prior to the initiation of the procedure. ANESTHESIA/SEDATION: 1.0 mg IV Versed 50 mcg IV Fentanyl Moderate Sedation Time:  16 MINUTES The patient was continuously monitored during the procedure by the interventional radiology nurse under my direct supervision. COMPLICATIONS: None immediate. TECHNIQUE: Informed written consent was obtained from the patient after a thorough discussion of the procedural risks, benefits and alternatives. All questions were addressed. Maximal Sterile Barrier Technique was utilized including caps, mask, sterile gowns, sterile gloves, sterile drape, hand hygiene and skin antiseptic. A timeout was performed prior to the initiation of the procedure. PROCEDURE: previous  imaging reviewed. patient positioned supine. noncontrast localization CT performed. The large left pelvic tubo-ovarian abscess was localized and marked for a left lower quadrant anterolateral approach. Overlying skin marked. Under sterile conditions and local anesthesia, an 18 gauge 10 cm access needle was advanced from a left lower quadrant anterolateral approach into the air-fluid collection. Needle position confirmed with CT. Syringe aspiration yielded exudative fluid. Tract dilatation performed over Amplatz guidewire to insert a 10 FPakistandrain. Drain catheter position confirmed with CT. Catheter secured with a  Prolene suture and connected to external gravity drainage bag. No immediate complication. Patient tolerated the procedure well. FINDINGS: Imaging confirms percutaneous needle access of the pelvic tubo-ovarian abscess for drain insertion IMPRESSION: Successful CT-guided left lower quadrant pelvic abscess drain insertion as above. Electronically Signed   By: Jerilynn Mages.  Shick M.D.   On: 11/21/2017 13:26        Scheduled Meds: . doxycycline  100 mg Oral Q12H  . feeding supplement  1 Container Oral BID BM  . ferrous sulfate  325 mg Oral Daily  . megestrol  80 mg Oral BID  . metroNIDAZOLE  500 mg Oral Q8H  . polyethylene glycol  17 g Oral Daily  . sodium chloride flush  3 mL Intravenous Q12H  . sodium chloride flush  5 mL Intracatheter Q8H  . sodium chloride flush  5 mL Intracatheter Q8H   Continuous Infusions: . heparin 2,500 Units/hr (11/22/17 0512)     LOS: 7 days    Colen Eltzroth Tanna Furry, MD [facility] Pager [phone]  If 7PM-7AM, please contact night-coverage [email or web address] Password [facility] 11/22/2017, 1:43 PM

## 2017-11-22 NOTE — Progress Notes (Signed)
Nutrition Follow-up  DOCUMENTATION CODES:   Non-severe (moderate) malnutrition in context of chronic illness, Obesity unspecified  INTERVENTION:   Boost Breeze po BID, each supplement provides 250 kcal and 9 grams of protein   NUTRITION DIAGNOSIS:   Moderate Malnutrition related to chronic illness(DVT with antiphospholipid) as evidenced by mild muscle depletion, percent weight loss(9.6% weight loss in 4 months).  Ongoing   GOAL:   Patient will meet greater than or equal to 90% of their needs  Progressing  MONITOR:   PO intake, Supplement acceptance, Weight trends, Labs    ASSESSMENT:   42 year old female who presented to ED from [facility] for evaluation of fever. Pt with PMH significant for abnormal uterine bleeding due to fibroids s/p ablation, anemia, and DVT with anti-phospholipid. Pt admitted for treatment of IVC thrombosis and sepsis due to [facility].  3/22 Per previous RD note:   Pt poor po d/t severe N/V and unable to tolerate regular diet  Pt only tolerating light foods (fruit, yogurt, sprite, gatorade, soup)  3/26 Per MD note 3/28  Sepsis continue to be treated with antibiotics  CT scan showed residual abscess requiring IR intervention to drain (IR drain placed LLQ)  K and Mg are wnl but continued to be monitored  Spoke with pt who reports:   She is feeling better today  Her N/V has subsided over past few days and has been able to tolerate more foods. She has been trying cereal in a.m. and some chicken at lunch/dinner  Her PO intake is still small (~1 cup/meal)  The importance of PO intake of was discussed; strategies for increasing PO intake were provided and pt agreed to ONS; she was not interested in Ensure, but was willing to try Boost Breeze   Medications: megace, ferrous sulfate, miralax  Labs reviewed  Diet Order:  Diet regular Room service appropriate? Yes; Fluid consistency: Thin  EDUCATION NEEDS:   No education needs have been  identified at this time  Skin:  Skin Assessment: Reviewed RN Assessment  Last BM:  3/26  Height:   Ht Readings from Last 1 Encounters:  11/16/17 5' 9"  (1.753 m)    Weight:   Wt Readings from Last 1 Encounters:  11/16/17 203 lb 7.8 oz (92.3 kg)    Ideal Body Weight:  65.9 kg  BMI:  Body mass index is 30.05 kg/m.  Estimated Nutritional Needs:   Kcal:  2200-2400 kcal/day  Protein:  80-95 grams/day  Fluid:  2.2-2.4 L/day    [name] Dietetic Intern Pager: [phone]

## 2017-11-22 NOTE — Progress Notes (Signed)
ANTICOAGULATION CONSULT NOTE  Pharmacy Consult for heparin Indication: IVC thrombosis  No Known Allergies  Patient Measurements: Height: 5' 9"  (175.3 cm) Weight: 203 lb 7.8 oz (92.3 kg) IBW/kg (Calculated) : 66.2 Heparin Dosing Weight: 85.6 kg  Vital Signs: Temp: 98.4 F (36.9 C) (03/28 0508) Temp Source: Oral (03/28 0508) BP: 125/87 (03/28 0508) Pulse Rate: 105 (03/28 0508)  Labs: Recent Labs    11/20/17 0706 11/21/17 0526 11/22/17 0547  HGB 7.8* 8.0* 8.0*  HCT 25.6* 26.5* 26.8*  PLT 391 467* 419*  LABPROT  --  15.4*  --   INR  --  1.23  --   HEPARINUNFRC 0.36 0.31 0.35  CREATININE 0.62 0.62 0.66    Estimated Creatinine Clearance: 110.8 mL/min (by C-G formula based on SCr of 0.66 mg/dL).  Medications:  Medications Prior to Admission  Medication Sig Dispense Refill Last Dose  . acetaminophen (TYLENOL) 500 MG tablet Take 1,000 mg by mouth every 6 (six) hours as needed for fever.   11/14/2017 at Unknown time  . Ferrous Sulfate (IRON) 325 (65 Fe) MG TABS Take 1 tablet (325 mg total) by mouth daily. 30 each 11 11/14/2017 at Unknown time  . megestrol (MEGACE) 40 MG tablet Take 2 tablets (80 mg total) by mouth 2 (two) times daily. 120 tablet 5 11/14/2017 at Unknown time  . Multiple Vitamin (MULTIVITAMIN WITH MINERALS) TABS tablet Take 1 tablet by mouth daily.   Past Week at Unknown time  . traMADol (ULTRAM) 50 MG tablet Take 1 tablet (50 mg total) by mouth 3 (three) times daily as needed. (Patient taking differently: Take 50 mg by mouth 3 (three) times daily as needed for moderate pain. ) 30 tablet 0 11/15/2017 at Unknown time  . XARELTO 20 MG TABS tablet TAKE 1 TABLET (20 MG TOTAL) BY MOUTH DAILY WITH SUPPER. 30 tablet 11 11/14/2017 at 0900 pm    Assessment: 42 yo on Xarelto PTA for antiphospholipid syndrome with multiple clots. 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 Xarelto 20 qd PTA LD 3/20 at 2100. Baseline aPTT 42, baseline HL 1.04. Baseline INR 1.79 (elevated  2nd xarelto). May need IVC filter.   11/22/2017   Heparin level 0.35, therapeutic  Hgb 8 low but stable, plt WNL  No bleeding or infusion issues per RN  Goal of Therapy:  Heparin level 0.3-0.7 units/ml aPTT 66-102 seconds Monitor platelets by anticoagulation protocol: Yes   Plan:   Continue heparin at 2500 units/hr   Daily HL & CBC  F/U plans to resume Xarelto once no more procedures needed   Netta Cedars, PharmD, BCPS Pager: [phone] 11/22/2017, 6:55 AM

## 2017-11-23 ENCOUNTER — Other Ambulatory Visit: Payer: Self-pay | Admitting: Radiology

## 2017-11-23 DIAGNOSIS — N739 Female pelvic inflammatory disease, unspecified: Secondary | ICD-10-CM

## 2017-11-23 LAB — HEPARIN LEVEL (UNFRACTIONATED): Heparin Unfractionated: 0.35 IU/mL (ref 0.30–0.70)

## 2017-11-23 LAB — TSH: TSH: 7.592 u[IU]/mL — AB (ref 0.350–4.500)

## 2017-11-23 LAB — T4, FREE: Free T4: 1.07 ng/dL (ref 0.61–1.12)

## 2017-11-23 MED ORDER — DOXYCYCLINE HYCLATE 100 MG PO TABS: 100.0000 mg | ORAL_TABLET | Freq: Two times a day (BID) | ORAL | 0 refills | Status: AC

## 2017-11-23 MED ORDER — RIVAROXABAN 20 MG PO TABS
20.0000 mg | ORAL_TABLET | Freq: Every day | ORAL | Status: DC
  Administered 2017-11-23: 20 mg via ORAL
  Filled 2017-11-23: qty 1

## 2017-11-23 MED ORDER — POLYETHYLENE GLYCOL 3350 17 G PO PACK: 17.0000 g | PACK | Freq: Every day | ORAL | 0 refills | Status: DC | PRN

## 2017-11-23 MED ORDER — METRONIDAZOLE 500 MG PO TABS: 500.0000 mg | ORAL_TABLET | Freq: Three times a day (TID) | ORAL | 0 refills | Status: AC

## 2017-11-23 MED ORDER — IBUPROFEN 400 MG PO TABS: 400.0000 mg | ORAL_TABLET | Freq: Four times a day (QID) | ORAL | 0 refills | Status: DC | PRN

## 2017-11-23 MED ORDER — TRAMADOL HCL 50 MG PO TABS: 100.0000 mg | ORAL_TABLET | Freq: Four times a day (QID) | ORAL | 0 refills | Status: DC | PRN

## 2017-11-23 NOTE — Progress Notes (Addendum)
ANTICOAGULATION CONSULT NOTE  Pharmacy Consult for heparin Indication: IVC thrombosis  No Known Allergies  Patient Measurements: Height: 5' 9"  (175.3 cm) Weight: 203 lb 7.8 oz (92.3 kg) IBW/kg (Calculated) : 66.2 Heparin Dosing Weight: 85.6 kg  Vital Signs: Temp: 97.9 F (36.6 C) (03/29 0516) Temp Source: Oral (03/29 0516) BP: 124/85 (03/29 0516) Pulse Rate: 101 (03/29 0516)  Labs: Recent Labs    11/21/17 0526 11/22/17 0547 11/23/17 0601  HGB 8.0* 8.0*  --   HCT 26.5* 26.8*  --   PLT 467* 419*  --   LABPROT 15.4*  --   --   INR 1.23  --   --   HEPARINUNFRC 0.31 0.35 0.35  CREATININE 0.62 0.66  --     Estimated Creatinine Clearance: 110.8 mL/min (by C-G formula based on SCr of 0.66 mg/dL).  Medications:  Medications Prior to Admission  Medication Sig Dispense Refill Last Dose  . acetaminophen (TYLENOL) 500 MG tablet Take 1,000 mg by mouth every 6 (six) hours as needed for fever.   11/14/2017 at Unknown time  . Ferrous Sulfate (IRON) 325 (65 Fe) MG TABS Take 1 tablet (325 mg total) by mouth daily. 30 each 11 11/14/2017 at Unknown time  . megestrol (MEGACE) 40 MG tablet Take 2 tablets (80 mg total) by mouth 2 (two) times daily. 120 tablet 5 11/14/2017 at Unknown time  . Multiple Vitamin (MULTIVITAMIN WITH MINERALS) TABS tablet Take 1 tablet by mouth daily.   Past Week at Unknown time  . traMADol (ULTRAM) 50 MG tablet Take 1 tablet (50 mg total) by mouth 3 (three) times daily as needed. (Patient taking differently: Take 50 mg by mouth 3 (three) times daily as needed for moderate pain. ) 30 tablet 0 11/15/2017 at Unknown time  . XARELTO 20 MG TABS tablet TAKE 1 TABLET (20 MG TOTAL) BY MOUTH DAILY WITH SUPPER. 30 tablet 11 11/14/2017 at 0900 pm    Assessment: 42 yo on Xarelto PTA for antiphospholipid syndrome with multiple clots. 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 Xarelto 20 qd PTA LD 3/20 at 2100. Baseline aPTT 42, baseline HL 1.04. Baseline INR 1.79 (elevated  2nd xarelto). May need IVC filter.   11/23/2017   Heparin level 0.35, therapeutic  Hgb 8 low but stable, plt WNL  No bleeding or infusion issues per RN  Goal of Therapy:  Heparin level 0.3-0.7 units/ml aPTT 66-102 seconds Monitor platelets by anticoagulation protocol: Yes   Plan:   Continue heparin at 2500 units/hr   Daily HL & CBC  F/U plans to resume Xarelto once no more procedures needed   Netta Cedars, PharmD, BCPS Pager: [phone] 11/23/2017, 7:06 AM  Addendum:  No further IR procedures planned.  MD requested switch IV heparin to Xarelto.  Patient takes Xarelto 74m daily with FOOD which is appropriate for her indication & renal function. D/C IV heparin & heparin labs.  Resume Xarelto home dose.   Pharmacy to sign off.  Will monitor peripherally for s/sx of bleeding.   MNetta Cedars PharmD, BCPS 11/23/2017@12 :50 PM

## 2017-11-23 NOTE — Discharge Summary (Signed)
Physician Discharge Summary  [name] JXB:[id number] DOB: 26-Sep-1975 DOA: 11/15/2017  PCP: Venia Carbon, MD  Admit date: 11/15/2017 Discharge date: 11/23/2017  Admitted From:home Disposition:home  Recommendations for Outpatient Follow-up:  1. Follow up with PCP in 1-2 weeks 2. Please obtain BMP/CBC in one week 3. Please follow-up with IR, PCP, hematology   Home Health: None Equipment/Devices:pelvic drain per IR Discharge Condition:stable CODE STATUS:full code Diet recommendation:heart healthy  Brief/Interim Summary: 42 year old female with medical history significant for DUB associated with fibroids, antiphospholipid syndrome with multiple DVTs and anemia resented to the emergency department complaining of abdominal pain, fever and chills. Upon ED evaluation patient was found to be febrile, with severe abdominal pain and CT of the abdomen/Pelvis shows large multiloculated abscess measuring 13.8 x 8.4 x 11.1 cm with surrounding inflammatory concerning for tubo-ovarian abscess, mild bilateral hydronephrosis likely secondary to distal ureteral obstruction due to pelvic pathology. Hypodense filling defect within the IVC concerning for thrombus. Mild retroperitoneal and left external iliac lymphadenopathy. WBC elevated at 24.8, hemoglobin 6.1 and potassium 3.4. UA grossly abnormal. Patient was admitted with working diagnosis of sepsis secondary to tubo-ovarian abscess and IVC thrombus complicated with symptomatic anemia. Patient was started on broad-spectrum antibiotics and IV heparin. Patient was also transfused PRBCs.  #Sepsis due to tubo-ovarian abscess: -Continue doxycycline and Flagyl to complete total 14 days course.  Patient had left pelvic drain placed by IR on 3/28.  The left gluteal drain was removed.  Patient reported pain is better with current regimen.  Leukocytosis improving.  Discussed with IR,Kevin, B today who will arrange for outpatient follow-up.  Follow-up  culture results.  Clinically improved.  She understand to follow-up with PCP, hematology, IR and gynecologist.  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  Patient with follow-up with  Dr. Lindi Adie from hematology.  #Symptomatic acute on chronic anemia likely due to blood loss, DUB; hemoglobin is stable  Recommend to follow-up with GYN on discharge.  #Dysfunctional uterine bleeding from uterine fibroid: Continue Megace.  Recommend to follow-up with OB/GYN and IR on discharge for further evaluation.  On oral iron  #Hypokalemia and hypomagnesemia: Lab improved   #Sinus tachycardia likely reactive due to stress and anemia.    Improved.  Patient has elevated TSH with normal free T4.  I recommended patient to follow-up with PCP to monitor thyroid function test in 4-6 weeks.  Free T3 is pending at this time.  Follow-up with PCP.  Discussed with IR, okay to discharge with drain and outpatient follow-up will be arranged by IR.  Patient is a stable on discharge with oral antibiotics.  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[place]) Active Problems:   DUB (dysfunctional uterine bleeding)   Symptomatic anemia   TOA (tubo-ovarian abscess)   Malnutrition of moderate degree    Discharge Instructions  Discharge Instructions    Call MD for:  difficulty breathing, headache or visual disturbances   Complete by:  As directed    Call MD for:  extreme fatigue   Complete by:  As directed    Call MD for:  hives   Complete by:  As directed    Call MD for:  persistant dizziness or light-headedness   Complete by:  As directed    Call MD for:  persistant nausea and vomiting   Complete by:  As directed    Call MD for:  severe uncontrolled pain   Complete by:  As directed    Call MD for:  temperature >100.4  Complete by:  As directed    Diet - low sodium heart healthy   Complete by:  As directed    Discharge instructions   Complete by:  As directed    Please follow-up with  interventional radiology for the drain and further management   Increase activity slowly   Complete by:  As directed      Allergies as of 11/23/2017   No Known Allergies     Medication List    TAKE these medications   acetaminophen 500 MG tablet Commonly known as:  TYLENOL Take 1,000 mg by mouth every 6 (six) hours as needed for fever.   doxycycline 100 MG tablet Commonly known as:  VIBRA-TABS Take 1 tablet (100 mg total) by mouth every 12 (twelve) hours for 5 days.   ibuprofen 400 MG tablet Commonly known as:  ADVIL,MOTRIN Take 1 tablet (400 mg total) by mouth every 6 (six) hours as needed for moderate pain (may also use for fever unresponsive to Tylenol).   Iron 325 (65 Fe) MG Tabs Take 1 tablet (325 mg total) by mouth daily.   megestrol 40 MG tablet Commonly known as:  MEGACE Take 2 tablets (80 mg total) by mouth 2 (two) times daily.   metroNIDAZOLE 500 MG tablet Commonly known as:  FLAGYL Take 1 tablet (500 mg total) by mouth every 8 (eight) hours for 5 days.   multivitamin with minerals Tabs tablet Take 1 tablet by mouth daily.   polyethylene glycol packet Commonly known as:  MIRALAX / GLYCOLAX Take 17 g by mouth daily as needed.   traMADol 50 MG tablet Commonly known as:  ULTRAM Take 2 tablets (100 mg total) by mouth every 6 (six) hours as needed for moderate pain. What changed:    how much to take  when to take this  reasons to take this   XARELTO 20 MG Tabs tablet Generic drug:  rivaroxaban TAKE 1 TABLET (20 MG TOTAL) BY MOUTH DAILY WITH SUPPER.      Follow-up Information    Venia Carbon, MD. Schedule an appointment as soon as possible for a visit in 1 week(s).   Specialties:  Internal Medicine, Pediatrics Contact information: [place] [address] [phone]        Nicholas Lose, MD Follow up.   Specialty:  Hematology and Oncology Contact information: [place]  [id number] [place], [place], DO Follow up.   Specialties:  Interventional Radiology, Radiology Why:  the clinic will call you. If you don't hear, please call in 2-3 days. Contact information: [address] [address] [place] [address] [phone]          No Known Allergies  Consultations: OB/GYN IR   Procedures/Studies: drain placement  Subjective: Seen and examined at bedside.  Reported doing good.  Denies headache, dizziness, nausea vomiting chest pain shortness of breath.  No abdominal pelvic pain.  Has a drain placed  Discharge Exam: Vitals:   11/22/17 2037 11/23/17 0516  BP: 139/74 124/85  Pulse: (!) 113 (!) 101  Resp: 18 18  Temp: 98.2 F (36.8 C) 97.9 F (36.6 C)  SpO2: 99% 100%   Vitals:   11/22/17 1300 11/22/17 1700 11/22/17 2037 11/23/17 0516  BP: 138/80 113/69 139/74 124/85  Pulse: (!) 110 81 (!) 113 (!) 101  Resp: 18 18 18 18   Temp: 98.1 F (36.7 C) 99.4 F (37.4 C) 98.2 F (36.8  C) 97.9 F (36.6 C)  TempSrc: Oral Oral Oral Oral  SpO2: 100% 100% 99% 100%  Weight:      Height:        General: Pt is alert, awake, not in acute distress Cardiovascular: RRR, S1/S2 +, no rubs, no gallops Respiratory: CTA bilaterally, no wheezing, no rhonchi Abdominal: Soft, NT, ND, bowel sounds + Extremities: no edema, no cyanosis Left pelvic drain site looks clean.   The results of significant diagnostics from this hospitalization (including imaging, microbiology, ancillary and laboratory) are listed below for reference.     Microbiology: Recent Results (from the past 240 hour(s))  Blood Culture (routine x 2)     Status: None   Collection Time: 11/15/17  3:06 PM  Result Value Ref Range Status   Specimen Description   Final    BLOOD RIGHT ANTECUBITAL Performed at [facility] [address] [place], [place] [address]    Special Requests   Final    BOTTLES DRAWN AEROBIC AND ANAEROBIC Blood Culture adequate  volume Performed at [facility] [address], [place], [place] [address]    Culture   Final    NO GROWTH 5 DAYS Performed at [facility], [place] [address] [place], [place] [address]    Report Status 11/20/2017 FINAL  Final  Blood Culture (routine x 2)     Status: None   Collection Time: 11/15/17  3:06 PM  Result Value Ref Range Status   Specimen Description   Final    BLOOD LEFT ANTECUBITAL Performed at [facility] [address], [place], [place] [address]    Special Requests   Final    BOTTLES DRAWN AEROBIC AND ANAEROBIC Blood Culture adequate volume Performed at [facility] [address] [place], [place] [address]    Culture   Final    NO GROWTH 5 DAYS Performed at [facility], [place] [address] [place], [place] [address]    Report Status 11/20/2017 FINAL  Final  Urine culture     Status: Abnormal   Collection Time: 11/15/17  5:38 PM  Result Value Ref Range Status   Specimen Description   Final    URINE, RANDOM Performed at [facility] [address], [place], [place] [address]    Special Requests   Final    NONE Performed at [facility], [place] [address] [place], [place] [address]    Culture MULTIPLE SPECIES PRESENT, SUGGEST RECOLLECTION (A)  Final   Report Status 11/17/2017 FINAL  Final  MRSA PCR Screening     Status: None   Collection Time: 11/16/17 12:28 AM  Result Value Ref Range Status   MRSA by PCR NEGATIVE NEGATIVE Final    Comment:        The GeneXpert MRSA Assay (FDA approved for NASAL specimens only), is one component of a comprehensive MRSA colonization surveillance program. It is not intended to diagnose MRSA infection nor to guide or monitor treatment for MRSA infections. Performed at [facility], [place] [address] [place], [place] [address]   Aerobic/Anaerobic Culture (surgical/deep wound)     Status: None  (Preliminary result)   Collection Time: 11/17/17 10:39 AM  Result Value Ref Range Status   Specimen Description   Final    ABSCESS PELVIS Performed at [facility], [place] [address], [place], [place] [address]    Special Requests   Final    NONE  Performed at [facility], [place] [address], [place], [place] [address]    Gram Stain   Final    ABUNDANT WBC PRESENT, PREDOMINANTLY PMN ABUNDANT GRAM POSITIVE COCCI IN PAIRS    Culture   Final    NO GROWTH 5 DAYS NO ANAEROBES ISOLATED; CULTURE IN PROGRESS FOR 5 DAYS Performed at [facility] [address] [place], [place] [address]    Report Status PENDING  Incomplete  Aerobic/Anaerobic Culture (surgical/deep wound)     Status: None (Preliminary result)   Collection Time: 11/21/17 12:38 PM  Result Value Ref Range Status   Specimen Description ABSCESS PELVIC  Final   Special Requests NONE  Final   Gram Stain   Final    ABUNDANT WBC PRESENT,BOTH PMN AND MONONUCLEAR MODERATE GRAM POSITIVE COCCI    Culture   Final    NO GROWTH 2 DAYS NO ANAEROBES ISOLATED; CULTURE IN PROGRESS FOR 5 DAYS Performed at [facility], [place] [address] [place], [place] [address]    Report Status PENDING  Incomplete     Labs: BNP (last 3 results) No results for input(s): BNP in the last 8760 hours. Basic Metabolic Panel: Recent Labs  Lab 11/18/17 0521 11/19/17 0539 11/20/17 0706 11/21/17 0526 11/22/17 0547  NA 138 137 140 140 144  K 3.1* 3.3* 2.9* 3.1* 3.6  CL 105 107 107 108 113*  CO2 22 23 25 24 23   GLUCOSE 109* 99 96 101* 99  BUN 9 6 5* 6 6  CREATININE 0.69 0.64 0.62 0.62 0.66  CALCIUM 7.8* 7.7* 8.0* 8.2* 8.3*  MG 1.7 1.6* 1.2* 1.4* 1.8   Liver Function Tests: No results for input(s): AST, ALT, ALKPHOS, BILITOT, PROT, ALBUMIN in the last 168 hours. No results for input(s): LIPASE, AMYLASE in the last 168 hours. No results for input(s): AMMONIA in the last 168 hours. CBC: Recent Labs  Lab 11/18/17 0521  11/19/17 0539 11/20/17 0706 11/21/17 0526 11/22/17 0547  WBC 18.9* 15.0* 16.5* 15.1* 10.2  NEUTROABS 15.2*  --   --   --   --   HGB 8.1* 7.9* 7.8* 8.0* 8.0*  HCT 26.8* 26.2* 25.6* 26.5* 26.8*  MCV 84.8 85.6 85.0 87.2 87.9  PLT 309 373 391 467* 419*   Cardiac Enzymes: No results for input(s): CKTOTAL, CKMB, CKMBINDEX, TROPONINI in the last 168 hours. BNP: Invalid input(s): POCBNP CBG: No results for input(s): GLUCAP in the last 168 hours. D-Dimer No results for input(s): DDIMER in the last 72 hours. Hgb A1c No results for input(s): HGBA1C in the last 72 hours. Lipid Profile No results for input(s): CHOL, HDL, LDLCALC, TRIG, CHOLHDL, LDLDIRECT in the last 72 hours. Thyroid function studies Recent Labs    11/23/17 0601  TSH 7.592*   Anemia work up No results for input(s): VITAMINB12, FOLATE, FERRITIN, TIBC, IRON, RETICCTPCT in the last 72 hours. Urinalysis    Component Value Date/Time   COLORURINE YELLOW 11/15/2017 1738   APPEARANCEUR HAZY (A) 11/15/2017 1738   LABSPEC 1.015 11/15/2017 1738   PHURINE 6.0 11/15/2017 1738   GLUCOSEU NEGATIVE 11/15/2017 1738   HGBUR LARGE (A) 11/15/2017 1738   BILIRUBINUR NEGATIVE 11/15/2017 1738   KETONESUR NEGATIVE 11/15/2017 1738   PROTEINUR 30 (A) 11/15/2017 1738   UROBILINOGEN 1.0 04/23/2007 0552   NITRITE NEGATIVE 11/15/2017 1738   LEUKOCYTESUR SMALL (A) 11/15/2017 1738   Sepsis Labs Invalid input(s): PROCALCITONIN,  WBC,  LACTICIDVEN Microbiology Recent Results (from the past 240 hour(s))  Blood Culture (  routine x 2)     Status: None   Collection Time: 11/15/17  3:06 PM  Result Value Ref Range Status   Specimen Description   Final    BLOOD RIGHT ANTECUBITAL Performed at [facility] [address] [place], [place] [address]    Special Requests   Final    BOTTLES DRAWN AEROBIC AND ANAEROBIC Blood Culture adequate volume Performed at [facility] [address], [place], [place]  [address]    Culture   Final    NO GROWTH 5 DAYS Performed at [facility], [place] [address] [place], [place] [address]    Report Status 11/20/2017 FINAL  Final  Blood Culture (routine x 2)     Status: None   Collection Time: 11/15/17  3:06 PM  Result Value Ref Range Status   Specimen Description   Final    BLOOD LEFT ANTECUBITAL Performed at [facility] [address], [place], [place] [address]    Special Requests   Final    BOTTLES DRAWN AEROBIC AND ANAEROBIC Blood Culture adequate volume Performed at [facility] [address], [place], [place] [address]    Culture   Final    NO GROWTH 5 DAYS Performed at [facility], [place] [address] [place], [place] [address]    Report Status 11/20/2017 FINAL  Final  Urine culture     Status: Abnormal   Collection Time: 11/15/17  5:38 PM  Result Value Ref Range Status   Specimen Description   Final    URINE, RANDOM Performed at [facility] [address] [place], [place] [address]    Special Requests   Final    NONE Performed at [facility], [place] [address], [place], [place] [address]    Culture MULTIPLE SPECIES PRESENT, SUGGEST RECOLLECTION (A)  Final   Report Status 11/17/2017 FINAL  Final  MRSA PCR Screening     Status: None   Collection Time: 11/16/17 12:28 AM  Result Value Ref Range Status   MRSA by PCR NEGATIVE NEGATIVE Final    Comment:        The GeneXpert MRSA Assay (FDA approved for NASAL specimens only), is one component of a comprehensive MRSA colonization surveillance program. It is not intended to diagnose MRSA infection nor to guide or monitor treatment for MRSA infections. Performed at [facility], [place] [address], [place], [place] [address]   Aerobic/Anaerobic Culture (surgical/deep wound)     Status: None (Preliminary result)   Collection Time: 11/17/17 10:39 AM  Result Value Ref Range Status    Specimen Description   Final    ABSCESS PELVIS Performed at [facility], [place] [address], [place], [place] [address]    Special Requests   Final    NONE Performed at [facility], [place] [address] [place], [place] [address]    Gram Stain   Final    ABUNDANT WBC PRESENT, PREDOMINANTLY PMN ABUNDANT GRAM POSITIVE COCCI IN PAIRS    Culture   Final    NO GROWTH 5 DAYS NO ANAEROBES ISOLATED; CULTURE IN PROGRESS FOR 5 DAYS Performed at [facility] [address], [place], [place] [address]    Report Status PENDING  Incomplete  Aerobic/Anaerobic Culture (surgical/deep wound)     Status: None (Preliminary result)   Collection Time: 11/21/17 12:38 PM  Result Value Ref Range Status   Specimen Description ABSCESS  PELVIC  Final   Special Requests NONE  Final   Gram Stain   Final    ABUNDANT WBC PRESENT,BOTH PMN AND MONONUCLEAR MODERATE GRAM POSITIVE COCCI    Culture   Final    NO GROWTH 2 DAYS NO ANAEROBES ISOLATED; CULTURE IN PROGRESS FOR 5 DAYS Performed at [facility], [place] [address], [place], [place] [address]    Report Status PENDING  Incomplete     Time coordinating discharge: 33 minutes  SIGNED:   Rosita Fire, MD  [facility] 11/23/2017, 12:59 PM  If 7PM-7AM, please contact night-coverage [email or web address] Password [email or web address]

## 2017-11-24 LAB — AEROBIC/ANAEROBIC CULTURE W GRAM STAIN (SURGICAL/DEEP WOUND)

## 2017-11-24 LAB — AEROBIC/ANAEROBIC CULTURE (SURGICAL/DEEP WOUND)

## 2017-11-24 LAB — T3, FREE: T3, Free: 3 pg/mL (ref 2.0–4.4)

## 2017-11-26 ENCOUNTER — Other Ambulatory Visit: Payer: Self-pay | Admitting: Internal Medicine

## 2017-11-26 DIAGNOSIS — N739 Female pelvic inflammatory disease, unspecified: Secondary | ICD-10-CM

## 2017-11-26 LAB — AEROBIC/ANAEROBIC CULTURE W GRAM STAIN (SURGICAL/DEEP WOUND): Culture: NO GROWTH

## 2017-11-26 LAB — AEROBIC/ANAEROBIC CULTURE (SURGICAL/DEEP WOUND)

## 2017-11-27 ENCOUNTER — Telehealth: Payer: Self-pay

## 2017-11-27 NOTE — Telephone Encounter (Signed)
Spoke to pt. She said she is starting to feel better. I scheduled her for 12-06-17 at 345 with Dr Silvio Pate

## 2017-11-27 NOTE — Telephone Encounter (Signed)
Pt calling to request blood work to see if she is anemic. Pt still feeling unwell after being discharge from the hospital. Pt stated that she received a total of 2 units blood transfusion over the weekend. Pt started her period and is currently on xarelto. She would like to see if her counts came back down. Denies any sob or chest pain at this time, but is feeling weak. Scheduled pt for lab check tomorrow to see if pt will need any more blood transfusions. Advised for pt to contact her pcp as well and review xarelto use.

## 2017-11-28 ENCOUNTER — Ambulatory Visit
Admission: RE | Admit: 2017-11-28 | Discharge: 2017-11-28 | Disposition: A | Discharge status: Home / Self Care | Payer: BC Managed Care – PPO | Source: Ambulatory Visit | Attending: Internal Medicine | Admitting: Internal Medicine

## 2017-11-28 ENCOUNTER — Other Ambulatory Visit: Payer: Self-pay | Admitting: Internal Medicine

## 2017-11-28 ENCOUNTER — Encounter: Payer: Self-pay | Admitting: Radiology

## 2017-11-28 ENCOUNTER — Ambulatory Visit
Admission: RE | Admit: 2017-11-28 | Discharge: 2017-11-28 | Disposition: A | Discharge status: Home / Self Care | Payer: BC Managed Care – PPO | Source: Ambulatory Visit | Attending: Radiology | Admitting: Radiology

## 2017-11-28 ENCOUNTER — Inpatient Hospital Stay: Payer: BC Managed Care – PPO | Attending: Hematology and Oncology

## 2017-11-28 DIAGNOSIS — N739 Female pelvic inflammatory disease, unspecified: Secondary | ICD-10-CM

## 2017-11-28 DIAGNOSIS — D500 Iron deficiency anemia secondary to blood loss (chronic): Secondary | ICD-10-CM | POA: Insufficient documentation

## 2017-11-28 DIAGNOSIS — D62 Acute posthemorrhagic anemia: Secondary | ICD-10-CM

## 2017-11-28 HISTORY — PX: IR RADIOLOGIST EVAL & MGMT: IMG5224

## 2017-11-28 LAB — CBC WITH DIFFERENTIAL (CANCER CENTER ONLY)
BASOS ABS: 0.1 10*3/uL (ref 0.0–0.1)
BASOS PCT: 1 %
Eosinophils Absolute: 0 10*3/uL (ref 0.0–0.5)
Eosinophils Relative: 0 %
HEMATOCRIT: 24.8 % — AB (ref 34.8–46.6)
Hemoglobin: 7.6 g/dL — ABNORMAL LOW (ref 11.6–15.9)
Lymphocytes Relative: 10 %
Lymphs Abs: 1.3 10*3/uL (ref 0.9–3.3)
MCH: 26.8 pg (ref 25.1–34.0)
MCHC: 30.7 g/dL — AB (ref 31.5–36.0)
MCV: 87.3 fL (ref 79.5–101.0)
MONO ABS: 0.8 10*3/uL (ref 0.1–0.9)
MONOS PCT: 6 %
NEUTROS ABS: 10.4 10*3/uL — AB (ref 1.5–6.5)
Neutrophils Relative %: 83 %
PLATELETS: 361 10*3/uL (ref 145–400)
RBC: 2.83 MIL/uL — ABNORMAL LOW (ref 3.70–5.45)
RDW: 20.8 % — AB (ref 11.2–14.5)
WBC Count: 12.6 10*3/uL — ABNORMAL HIGH (ref 3.9–10.3)

## 2017-11-28 LAB — SAMPLE TO BLOOD BANK

## 2017-11-28 MED ORDER — IOPAMIDOL (ISOVUE-300) INJECTION 61%
100.0000 mL | Freq: Once | INTRAVENOUS | Status: AC | PRN
  Administered 2017-11-28: 100 mL via INTRAVENOUS

## 2017-11-28 NOTE — Progress Notes (Signed)
Chief Complaint: Patient was seen in consultation today for  Chief Complaint  Patient presents with  . Follow-up    follow up abscess drain   at the request of Oliver  Referring Physician(s): Bruning,Kevin  History of Present Illness: [name] is a 42 y.o. female returns in reevaluation of a tubo-ovarian abscess. She was admitted last week and underwent a left transgluteal abscess drain. A few days later, repeat imaging demonstrated resolution of a deeper pelvic locule but enlargement of the more anterior left pelvic abscess. She subsequently underwent left anterior pelvic drainage and removal of the previous transgluteal drain. Since her discharge several days ago, she has been feeling well and denies fevers. She still has continued pelvic pain. Output from the drain is markedly reduced.  Past Medical History:  Diagnosis Date  . Anemia 09/2017   REQUIRING A TRANSFUSION  . Antiphospholipid syndrome ([place])   . Breast cyst    BILATERAL  . DVT (deep venous thrombosis) ([place])   . Dysfunctional uterine bleeding   . Fibroids   . Obesity     Past Surgical History:  Procedure Laterality Date  . CESAREAN SECTION    . DILITATION & CURRETTAGE/HYSTROSCOPY WITH HYDROTHERMAL ABLATION N/A 10/08/2014   Procedure: DILATATION & CURETTAGE/HYSTEROSCOPY WITH HYDROTHERMAL ABLATION;  Surgeon: Osborne Oman, MD;  Location: [facility];  Service: Gynecology;  Laterality: N/A;  . IR RADIOLOGIST EVAL & MGMT  10/10/2017  . MYOMECTOMY      Allergies: Patient has no known allergies.  Medications: Prior to Admission medications   Medication Sig Start Date End Date Taking? Authorizing Provider  acetaminophen (TYLENOL) 500 MG tablet Take 1,000 mg by mouth every 6 (six) hours as needed for fever.   Yes [provider]  doxycycline (VIBRA-TABS) 100 MG tablet Take 1 tablet (100 mg total) by mouth every 12 (twelve) hours for 5 days. 11/23/17 11/28/17 Yes Rosita Fire, MD    Ferrous Sulfate (IRON) 325 (65 Fe) MG TABS Take 1 tablet (325 mg total) by mouth daily. 10/22/17  Yes Venia Carbon, MD  ibuprofen (ADVIL,MOTRIN) 400 MG tablet Take 1 tablet (400 mg total) by mouth every 6 (six) hours as needed for moderate pain (may also use for fever unresponsive to Tylenol). 11/23/17  Yes Rosita Fire, MD  megestrol (MEGACE) 40 MG tablet Take 2 tablets (80 mg total) by mouth 2 (two) times daily. 10/08/17  Yes Anyanwu, Sallyanne Havers, MD  metroNIDAZOLE (FLAGYL) 500 MG tablet Take 1 tablet (500 mg total) by mouth every 8 (eight) hours for 5 days. 11/23/17 11/28/17 Yes Rosita Fire, MD  Multiple Vitamin (MULTIVITAMIN WITH MINERALS) TABS tablet Take 1 tablet by mouth daily.   Yes [provider]  polyethylene glycol (MIRALAX / GLYCOLAX) packet Take 17 g by mouth daily as needed. 11/23/17  Yes Rosita Fire, MD  traMADol (ULTRAM) 50 MG tablet Take 2 tablets (100 mg total) by mouth every 6 (six) hours as needed for moderate pain. 11/23/17  Yes Rosita Fire, MD  XARELTO 20 MG TABS tablet TAKE 1 TABLET (20 MG TOTAL) BY MOUTH DAILY WITH SUPPER. 09/13/17  Yes Venia Carbon, MD     Family History  Problem Relation Age of Onset  . Diabetes Paternal Grandmother   . Heart disease Neg Hx   . Hypertension Neg Hx   . Cancer Neg Hx        breast or colon cancer    Social History   Socioeconomic History  .  Marital status: Married    Spouse name: Not on file  . Number of children: 1  . Years of education: Not on file  . Highest education level: Not on file  Occupational History  . 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 Financial resource strain: Not on file  . Food insecurity:    Worry: Not on file    Inability: Not on file  . Transportation needs:    Medical: Not on file    Non-medical: Not on file  Tobacco Use  . Smoking status: Never Smoker  . Smokeless tobacco: Never Used  Substance and Sexual  Activity  . Alcohol use: Yes    Comment: rare  . Drug use: No  . Sexual activity: Yes    Partners: Male    Birth control/protection: None  Lifestyle  . Physical activity:    Days per week: Not on file    Minutes per session: Not on file  . Stress: Not on file  Relationships  . Social connections:    Talks on phone: Not on file    Gets together: Not on file    Attends religious service: Not on file    Active member of club or organization: Not on file    Attends meetings of clubs or organizations: Not on file    Relationship status: Not on file  Other Topics Concern  . Not on file  Social History Narrative  . Not on file     Review of Systems: A 12 point ROS discussed and pertinent positives are indicated in the HPI above.  All other systems are negative.  Review of Systems  Vital Signs: BP 133/85   Pulse (!) 137   Temp 98.1 F (36.7 C) (Oral)   Resp 16   Ht 5' 9"  (1.753 m)   Wt 201 lb (91.2 kg)   SpO2 100%   BMI 29.68 kg/m   Physical Exam   Imaging: Dg Chest 2 View  Result Date: 11/15/2017 CLINICAL DATA:  Low-grade fever, tachycardia, leukocytosis EXAM: CHEST - 2 VIEW COMPARISON:  CT chest 04/08/2008 FINDINGS: Normal heart size, mediastinal contours, and pulmonary vascularity. Lungs clear. No pulmonary infiltrate, pleural effusion or pneumothorax. Bones unremarkable. IMPRESSION: No acute abnormalities. Electronically Signed   By: Lavonia Dana M.D.   On: 11/15/2017 16:43   Ct Abdomen Pelvis W Contrast  Result Date: 11/20/2017 CLINICAL DATA:  42 year old female with tubo-ovarian abscess post drainage. Continued lower abdominal pain. Subsequent encounter. EXAM: CT ABDOMEN AND PELVIS WITH CONTRAST TECHNIQUE: Multidetector CT imaging of the abdomen and pelvis was performed using the standard protocol following bolus administration of intravenous contrast. CONTRAST:  158m ISOVUE-300 IOPAMIDOL (ISOVUE-300) INJECTION 61% COMPARISON:  11/17/2017 and 11/15/2017 CT.   10/22/2017 MR. FINDINGS: Lower chest: Mild basilar atelectasis. Heart size within normal limits. Dense breast parenchyma. Hepatobiliary: Enlarged liver spanning over 22.9 cm. No worrisome hepatic lesion. No calcified gallstone. Pancreas: No pancreatic mass or primary pancreatic inflammation. Spleen: Top normal size spleen without splenic mass. Adrenals/Urinary Tract: Mild bilateral hydronephrosis greater on left. No worrisome renal or adrenal lesion. Noncontrast filled views of the urinary bladder without primary urinary bladder abnormality. Stomach/Bowel: Bowel displaced by pelvic abnormalities as discussed below. No obvious primary bowel abnormality. Bowel extends towards periumbilical hernia but does not enter such. 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 Adenopathy pelvis and left periaortic region. Reproductive: Left tubo-ovarian abscess partially drain with catheter  placed left transgluteal position. Suspect that this drain will not adequately treat tubo-ovarian abscess which appears loculated with large residual component anterior to the drain measuring 9.6 x 10.8 x 9.9 cm. Small loculated component inferior left lateral aspect. Markedly enlarged uterus with superior extension displacement of surrounding structures containing multiple fibroids some of which may be necrotic. Other: Third spacing of fluid. Periumbilical fat, vessel and small amount of fluid containing hernia. Musculoskeletal: Bilateral sacroiliac joint degenerative changes. IMPRESSION: Left transgluteal catheter placed for treatment of left tubo-ovarian abscess. Inferior aspect of the abscess has been drained however, larger component does not appear to communicate with drain. This aspect of residual abscess measures 9.6 x 10.8 x 9.9 cm. Small loculated component inferior left lateral aspect. 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  Pelvic and left periaortic adenopathy. Bilateral hydronephrosis greater on the left probably caused by the pelvic abnormalities. Markedly enlarged uterus with multiple fibroids some of which are necrotic. Third spacing of fluid. Periumbilical fat, vessel small amount of fluid containing hernia. Enlarged liver spanning over a 22.9 cm. Images reviewed with radiology interventional service. Electronically Signed   By: Genia Del M.D.   On: 11/20/2017 14:26   Ct Abdomen Pelvis W Contrast  Result Date: 11/15/2017 CLINICAL DATA:  Fever, tachycardia, and leukocytosis. History of uterine fibroids. EXAM: CT ABDOMEN AND PELVIS WITH CONTRAST TECHNIQUE: Multidetector CT imaging of the abdomen and pelvis was performed using the standard protocol following bolus administration of intravenous contrast. CONTRAST:  100 cc ISOVUE-300 IOPAMIDOL (ISOVUE-300) INJECTION 61% COMPARISON:  CT abdomen pelvis dated August 02, 2017. FINDINGS: Lower chest: No acute abnormality. Hepatobiliary: No focal liver abnormality. The gallbladder is distended without wall thickening, gallstones, or biliary dilatation. Pancreas: Unremarkable. No pancreatic ductal dilatation or surrounding inflammatory changes. Spleen: Normal in size without focal abnormality. Adrenals/Urinary Tract: The adrenal glands are unremarkable. Delayed enhancement and excretion of contrast from the bilateral kidneys. Mild bilateral hydronephrosis. No focal renal lesion. No renal or ureteral calculi. Mild anterior and rightward displacement of the bladder, which is otherwise unremarkable in appearance. Stomach/Bowel: Stomach is within normal limits. Appendix is not definitely visualized, however there are no secondary inflammatory changes at the base of the cecum. No evidence of bowel wall thickening, distention, or inflammatory changes. Vascular/Lymphatic: Hypodense filling defect within the IVC. Multiple enlarged left external iliac lymph nodes measuring up to 13 mm in  short axis. Multiple mildly enlarged retroperitoneal lymph nodes measuring up to 12 mm in short axis. Reproductive: Interval increase in size of the uterus, now measuring up to 11.2 x 17.5 x 18.4 cm, previously 11.0 x 13.2 x 15.5 cm, with interval increase in size of the large posterior intramural fibroid. Interval increase in size of the multiloculated, fluid and air-filled left adnexal mass, measuring 13.8 x 8.4 x 11.1 cm. There are surrounding inflammatory changes. The right adnexa is unremarkable. Other: Small amount of free fluid in the pelvis. No pneumoperitoneum. Small fat containing umbilical hernia. Musculoskeletal: No acute or significant osseous findings. IMPRESSION: 1. Large multiloculated, fluid and air-filled left adnexal mass, measuring 13.8 x 8.4 x 11.1 cm, with surrounding inflammatory changes, concerning for tubo-ovarian abscess. 2. Interval increase in size of the uterus due to enlargement of a large posterior intramural fibroid. 3. New mild bilateral hydronephrosis and delayed renal function, likely secondary to bilateral distal ureteral obstruction due to pelvic pathology. 4. Hypodense filling defect within the IVC, concerning for thrombus. 5. Mild retroperitoneal and left external iliac lymphadenopathy  is nonspecific, but may be reactive. These results were called by telephone at the time of interpretation on 11/15/2017 at 6:34 pm to Dr. Carmon Sails , who verbally acknowledged these results. Electronically Signed   By: Titus Dubin M.D.   On: 11/15/2017 18:36   Dg Abd 2 Views  Result Date: 11/15/2017 CLINICAL DATA:  Abdominal pain.  Fever.  Fibroids. EXAM: ABDOMEN - 2 VIEW COMPARISON:  None. FINDINGS: The bowel gas pattern is normal. There is no evidence of free air on the decubitus view. No radio-opaque calculi or other significant radiographic abnormality is seen. IMPRESSION: Negative. Electronically Signed   By: Staci Righter M.D.   On: 11/15/2017 16:41   Ct Image Guided Drainage  By Percutaneous Catheter  Result Date: 11/21/2017 INDICATION: Large tubo-ovarian abscess EXAM: CT GUIDED DRAINAGE OF LEFT LOWER QUADRANT TUBO-OVARIAN ABSCESS MEDICATIONS: The patient is currently admitted to the hospital and receiving intravenous antibiotics. The antibiotics were administered within an appropriate time frame prior to the initiation of the procedure. ANESTHESIA/SEDATION: 1.0 mg IV Versed 50 mcg IV Fentanyl Moderate Sedation Time:  16 MINUTES The patient was continuously monitored during the procedure by the interventional radiology nurse under my direct supervision. COMPLICATIONS: None immediate. TECHNIQUE: Informed written consent was obtained from the patient after a thorough discussion of the procedural risks, benefits and alternatives. All questions were addressed. Maximal Sterile Barrier Technique was utilized including caps, mask, sterile gowns, sterile gloves, sterile drape, hand hygiene and skin antiseptic. A timeout was performed prior to the initiation of the procedure. PROCEDURE: previous imaging reviewed. patient positioned supine. noncontrast localization CT performed. The large left pelvic tubo-ovarian abscess was localized and marked for a left lower quadrant anterolateral approach. Overlying skin marked. Under sterile conditions and local anesthesia, an 18 gauge 10 cm access needle was advanced from a left lower quadrant anterolateral approach into the air-fluid collection. Needle position confirmed with CT. Syringe aspiration yielded exudative fluid. Tract dilatation performed over Amplatz guidewire to insert a 10 [place] drain. Drain catheter position confirmed with CT. Catheter secured with a Prolene suture and connected to external gravity drainage bag. No immediate complication. Patient tolerated the procedure well. FINDINGS: Imaging confirms percutaneous needle access of the pelvic tubo-ovarian abscess for drain insertion IMPRESSION: Successful CT-guided left lower quadrant  pelvic abscess drain insertion as above. Electronically Signed   By: Jerilynn Mages.  Shick M.D.   On: 11/21/2017 13:26   Ct Image Guided Drainage By Percutaneous Catheter  Result Date: 11/17/2017 INDICATION: 42 year old with a large complex pelvic fluid collection which is concerning for a tubo-ovarian abscess. Patient has an IVC thrombus and currently anticoagulated. EXAM: CT GUIDED DRAINAGE OF PELVIC ABSCESS MEDICATIONS: The patient is currently admitted to the hospital and receiving intravenous antibiotics. The antibiotics were administered within an appropriate time frame prior to the initiation of the procedure. ANESTHESIA/SEDATION: 1.5 mg IV Versed 125 mcg IV Fentanyl Moderate Sedation Time: 30 minutes The patient was continuously monitored during the procedure by the interventional radiology nurse under my direct supervision. COMPLICATIONS: None immediate. TECHNIQUE: Informed written consent was obtained from the patient after a thorough discussion of the procedural risks, benefits and alternatives. All questions were addressed. Specifically, patient was aware of the bleeding risk. Due to patient's IVC thrombus, we elected to perform the procedure with the patient on IV heparin. Timeout was performed prior to the initiation of the procedure. PROCEDURE: Patient was placed prone on the CT scanner. Images of the pelvis were obtained. The left gluteal region was prepped with chlorhexidine  and a sterile field was created. Skin and soft tissues were anesthetized with 1% lidocaine. Using CT guidance, an 18 gauge trocar needle was directed into the pelvic fluid collection from a left transgluteal approach. Purulent bloody fluid was aspirated. A stiff Amplatz wire was advanced into the pelvic collection. Tract was dilated to accommodate a 10 [place] multipurpose drain. Approximately 50 mL of bloody purulent fluid was removed. Specimen was sent for culture. Catheter was sutured to skin. FINDINGS: Again noted are complex pelvic  fluid collections compatible with a tubo-ovarian abscess. The caudal aspect of the collection contains fluid. The bloody purulent fluid was removed from this collection. However, there is a more cephalad component containing air-fluid levels. Initially, no air was able to be aspirated. IMPRESSION: CT-guided placement of a drainage catheter within the pelvic fluid collection. Bloody purulent fluid was removed and findings are compatible with a tubo-ovarian abscess. However, the pelvic fluid collection appears to be complex with air-fluid collections in the more cephalad component. Patient will need follow up imaging to see if this drain is able to decompress these other collections. Electronically Signed   By: Markus Daft M.D.   On: 11/17/2017 11:26   CT scan of the abdomen and pelvis today demonstrates resolution of the more anterior left pelvic abscess but recurrence of a deeper left pelvic abscess measuring 5.0 x 3.8 cm.   Labs:  CBC: Recent Labs    11/19/17 0539 11/20/17 0706 11/21/17 0526 11/22/17 0547  WBC 15.0* 16.5* 15.1* 10.2  HGB 7.9* 7.8* 8.0* 8.0*  HCT 26.2* 25.6* 26.5* 26.8*  PLT 373 391 467* 419*    COAGS: Recent Labs    10/02/17 2204 11/16/17 0046  11/16/17 1536 11/16/17 2245 11/17/17 0821 11/17/17 1956 11/21/17 0526  INR 1.97 1.78  --   --   --   --   --  1.23  APTT 46* 42*   < > 41* 47* 66* 59*  --    < > = values in this interval not displayed.    BMP: Recent Labs    11/19/17 0539 11/20/17 0706 11/21/17 0526 11/22/17 0547  NA 137 140 140 144  K 3.3* 2.9* 3.1* 3.6  CL 107 107 108 113*  CO2 23 25 24 23   GLUCOSE 99 96 101* 99  BUN 6 5* 6 6  CALCIUM 7.7* 8.0* 8.2* 8.3*  CREATININE 0.64 0.62 0.62 0.66  GFRNONAA >60 >60 >60 >60  GFRAA >60 >60 >60 >60    LIVER FUNCTION TESTS: Recent Labs    03/08/17 1602 08/02/17 1312 10/02/17 0814 11/15/17 1505  BILITOT 0.2 0.5 0.5 0.6  AST 10 15 14* 23  ALT 7 13* 10* 18  ALKPHOS 56 79 44 77  PROT 7.2 7.8 7.2  7.5  ALBUMIN 3.8 3.9 3.3* 2.2*    TUMOR MARKERS: No results for input(s): AFPTM, CEA, CA199, CHROMGRNA in the last 8760 hours.  Assessment and Plan:  So far, Mrs. Juliane Lack has undergone abscess drain 2. She feels well and does not have fevers and her symptoms do include continued pelvic discomfort. A repeat CTtoday, again demonstrates a residual sizable pelvic abscess which is deep in the left hemipelvis. The more anterior abscess has decompressed from the second drain. Given the short time course between the last drain placement and lack of fevers, continued drainage is recommended. Hopefully the residual abscess will decompress into the existing drain. A second option is to proceed with a third drain. I've recommended to hold off on this unless  the patient develops fevers.if her symptoms worsen or fevers develop, she was instructed to contact us and a left transgluteal a left transgluteal abscess drain will be scheduled and subsequently performed.  Thank you for this interesting consult.  I greatly enjoyed meeting [name] and look forward to participating in their care.  A copy of this report was sent to the requesting provider on this date.  Electronically Signed: Antwann Preziosi, ART A 11/28/2017, 12:56 PM   I spent a total of   15 Minutes in face to face in clinical consultation, greater than 50% of which was counseling/coordinating care for abscess drain.  Patient ID: [name], female   DOB: Apr 07, 1976, 42 y.o.   MRN: [id number]

## 2017-12-05 ENCOUNTER — Other Ambulatory Visit: Payer: BC Managed Care – PPO

## 2017-12-06 ENCOUNTER — Encounter: Payer: Self-pay | Admitting: Internal Medicine

## 2017-12-06 ENCOUNTER — Ambulatory Visit: Payer: BC Managed Care – PPO | Admitting: Internal Medicine

## 2017-12-06 ENCOUNTER — Telehealth: Payer: Self-pay | Admitting: Internal Medicine

## 2017-12-06 VITALS — BP 110/82 | HR 62 | Temp 97.2°F | Ht 69.0 in | Wt 184.0 lb

## 2017-12-06 DIAGNOSIS — I82409 Acute embolism and thrombosis of unspecified deep veins of unspecified lower extremity: Secondary | ICD-10-CM

## 2017-12-06 DIAGNOSIS — N938 Other specified abnormal uterine and vaginal bleeding: Secondary | ICD-10-CM | POA: Diagnosis not present

## 2017-12-06 DIAGNOSIS — N7093 Salpingitis and oophoritis, unspecified: Secondary | ICD-10-CM

## 2017-12-06 NOTE — Assessment & Plan Note (Signed)
Needs to continue the xarelto

## 2017-12-06 NOTE — Telephone Encounter (Signed)
I will do it at the visit

## 2017-12-06 NOTE — Progress Notes (Signed)
Subjective:    Patient ID: [name], female    DOB: Mar 21, 1976, 42 y.o.   MRN: [id number]  HPI Here for hospital follow up Had tubo-ovarian abscess Was drained percutaneously---sent home with drain Last week's study showed some increase--but no new intervention yet Will go back next week Continues on the antibiotics Is monogamous with husband--no evidence of STI as inciting event  No fever Some right flank pain--likely related to the fibroid  Some question of IVC thrombosis Continues on the xarelto  Still bleeding --variable but still a lot at times On the megace Told she is not a candidate for embolization---will probably have to wait till she is stable to get hysterectomy   Current Outpatient Medications on File Prior to Visit  Medication Sig Dispense Refill  . acetaminophen (TYLENOL) 500 MG tablet Take 1,000 mg by mouth every 6 (six) hours as needed for fever.    . Ferrous Sulfate (IRON) 325 (65 Fe) MG TABS Take 1 tablet (325 mg total) by mouth daily. 30 each 11  . ibuprofen (ADVIL,MOTRIN) 400 MG tablet Take 1 tablet (400 mg total) by mouth every 6 (six) hours as needed for moderate pain (may also use for fever unresponsive to Tylenol). 30 tablet 0  . megestrol (MEGACE) 40 MG tablet Take 2 tablets (80 mg total) by mouth 2 (two) times daily. 120 tablet 5  . Multiple Vitamin (MULTIVITAMIN WITH MINERALS) TABS tablet Take 1 tablet by mouth daily.    Alveda Reasons 20 MG TABS tablet TAKE 1 TABLET (20 MG TOTAL) BY MOUTH DAILY WITH SUPPER. 30 tablet 11   Current Facility-Administered Medications on File Prior to Visit  Medication Dose Route Frequency Provider Last Rate Last Dose  . oxyCODONE-acetaminophen (PERCOCET/ROXICET) 5-325 MG per tablet 2 tablet  2 tablet Oral Once Harle Stanford., PA-C        No Known Allergies  Past Medical History:  Diagnosis Date  . Anemia 09/2017   REQUIRING A TRANSFUSION  . Antiphospholipid syndrome ([place])   . Breast cyst    BILATERAL  .  DVT (deep venous thrombosis) ([place])   . Dysfunctional uterine bleeding   . Fibroids   . Obesity     Past Surgical History:  Procedure Laterality Date  . CESAREAN SECTION    . DILITATION & CURRETTAGE/HYSTROSCOPY WITH HYDROTHERMAL ABLATION N/A 10/08/2014   Procedure: DILATATION & CURETTAGE/HYSTEROSCOPY WITH HYDROTHERMAL ABLATION;  Surgeon: Osborne Oman, MD;  Location: [facility];  Service: Gynecology;  Laterality: N/A;  . IR RADIOLOGIST EVAL & MGMT  10/10/2017  . IR RADIOLOGIST EVAL & MGMT  11/28/2017  . MYOMECTOMY      Family History  Problem Relation Age of Onset  . Diabetes Paternal Grandmother   . Heart disease Neg Hx   . Hypertension Neg Hx   . Cancer Neg Hx        breast or colon cancer    Social History   Socioeconomic History  . Marital status: Married    Spouse name: Not on file  . Number of children: 1  . Years of education: Not on file  . Highest education level: Not on file  Occupational History  . 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 Financial resource strain: Not on file  . Food insecurity:    Worry: Not on file    Inability: Not on file  . Transportation needs:    Medical: Not on file    Non-medical: Not  on file  Tobacco Use  . Smoking status: Never Smoker  . Smokeless tobacco: Never Used  Substance and Sexual Activity  . Alcohol use: Yes    Comment: rare  . Drug use: No  . Sexual activity: Yes    Partners: Male    Birth control/protection: None  Lifestyle  . Physical activity:    Days per week: Not on file    Minutes per session: Not on file  . Stress: Not on file  Relationships  . Social connections:    Talks on phone: Not on file    Gets together: Not on file    Attends religious service: Not on file    Active member of club or organization: Not on file    Attends meetings of clubs or organizations: Not on file    Relationship status: Not on file  . Intimate partner violence:    Fear of current  or ex partner: Not on file    Emotionally abused: Not on file    Physically abused: Not on file    Forced sexual activity: Not on file  Other Topics Concern  . Not on file  Social History Narrative  . Not on file   Review of Systems Appetite is off --relates to the antibiotic Has lost some weight No leg swelling    Objective:   Physical Exam  Constitutional: She appears well-developed. No distress.  Neck: No thyromegaly present.  Cardiovascular: Regular rhythm, normal heart sounds and intact distal pulses. Exam reveals no gallop.  No murmur heard. Mild tachycardia  Pulmonary/Chest: Effort normal and breath sounds normal. No respiratory distress. She has no wheezes. She has no rales.  Abdominal: Soft.  Large firm mass up close to umbilicus  Musculoskeletal:  Trace edema  Lymphadenopathy:    She has no cervical adenopathy.  Psychiatric: She has a normal mood and affect. Her behavior is normal.          Assessment & Plan:

## 2017-12-06 NOTE — Assessment & Plan Note (Signed)
Slowly improving Still on antibiotic Will go back to IR to reevaluate the drain

## 2017-12-06 NOTE — Assessment & Plan Note (Signed)
Ongoing bleeding Hemoglobin 7.6--actually reasonably good for her Won't be able to have the emoblization---need to work towards the hysterectomy

## 2017-12-06 NOTE — Telephone Encounter (Signed)
fmla paperwork in dr Silvio Pate in box Pt has appointment today @ 3:45

## 2017-12-07 ENCOUNTER — Encounter: Payer: Self-pay | Admitting: Obstetrics and Gynecology

## 2017-12-07 ENCOUNTER — Ambulatory Visit (INDEPENDENT_AMBULATORY_CARE_PROVIDER_SITE_OTHER): Payer: BC Managed Care – PPO | Admitting: Obstetrics and Gynecology

## 2017-12-07 ENCOUNTER — Telehealth: Payer: Self-pay | Admitting: *Deleted

## 2017-12-07 VITALS — BP 105/74 | HR 72 | Wt 183.6 lb

## 2017-12-07 DIAGNOSIS — N7093 Salpingitis and oophoritis, unspecified: Secondary | ICD-10-CM

## 2017-12-07 MED ORDER — NORETHINDRONE ACETATE 5 MG PO TABS: 10.0000 mg | ORAL_TABLET | Freq: Every day | ORAL | 1 refills | Status: DC

## 2017-12-07 NOTE — Telephone Encounter (Signed)
"  Need nurse line for Dr. Lindi Adie. No problems with any signs or symptoms, Questions about scans and appointments that came across [place]."

## 2017-12-07 NOTE — Telephone Encounter (Signed)
This RN spoke with pt who stated concern due to appointment " popping up on my chart that I wasn't aware I was scheduled ."  Noted pt is undergoing work up for uterine bleeding under Dr Kara Pacer and pt is concerned appointment with Dr Lindi Adie is due to this concern and her history of cancer.  This RN reviewed chart and informed pt appointment with Dr Lindi Adie is routine follow up and made prior to current medical concerns.  Pt stated relief but did ask if possible for appointments to be rescheduled to 4/17 - same day as CT so she would not have to take extra time off work.  LOS will be sent per above.

## 2017-12-07 NOTE — Progress Notes (Signed)
Obstetrics and Gynecology Established Patient Evaluation  Appointment Date: 12/07/2017  OBGYN Clinic: [facility] (Dr. Harolyn Rutherford)  Primary Care Provider: Viviana Simpler I  Referring Provider: Venia Carbon, MD  Chief Complaint:  Chief Complaint  Patient presents with  . Follow-up    History of Present Illness: KAHLI MAYON is a 42 y.o. African-American [id number] (No LMP recorded.), seen for the above chief complaint. Her past medical history is significant for VTE, chronic AUB, chronic anemia, fibroids, h/o HTA endometrial ablation, c/s and myomectomy  Patient admitted from 3/21 to 3/29 for left sided TOA and recurrent VTE. She had IR drains placed and GYN was consulted and no recommendations made.   She continues to have waxing and waning intensity of her AUB with spotting to clots. Currently, not heavy and pain is much better. She has one drain left and has a f/u appt with IR next week to see about removal. She is continuing to take megace 80 bid. At last visit on 2/11 with Dr. Harolyn Rutherford options d/w pt and pt states she wanted to pursue possible [place] so she was set up to see IR   Review of Systems: as noted in the History of Present Illness.  Past Medical History:  Past Medical History:  Diagnosis Date  . Anemia 09/2017   REQUIRING A TRANSFUSION  . Antiphospholipid syndrome ([place])   . Breast cyst    BILATERAL  . DVT (deep venous thrombosis) ([place])   . Dysfunctional uterine bleeding   . Fibroids   . Obesity     Past Surgical History:  Past Surgical History:  Procedure Laterality Date  . CESAREAN SECTION    . DILITATION & CURRETTAGE/HYSTROSCOPY WITH HYDROTHERMAL ABLATION N/A 10/08/2014   Procedure: DILATATION & CURETTAGE/HYSTEROSCOPY WITH HYDROTHERMAL ABLATION;  Surgeon: Osborne Oman, MD;  Location: [facility];  Service: Gynecology;  Laterality: N/A;  . IR RADIOLOGIST EVAL & MGMT  10/10/2017  . IR RADIOLOGIST EVAL & MGMT  11/28/2017  .  MYOMECTOMY      Past Obstetrical History:  OB History  Gravida Para Term Preterm AB Living  1 1 0 0 0 1  SAB TAB Ectopic Multiple Live Births  0 0 0 0 1    # Outcome Date GA Lbr Len/2nd Weight Sex Delivery Anes PTL Lv  1 Para 03/28/07    F CS-Classical   LIV    Past Gynecological History: As per HPI. History of Pap Smear(s): Yes.   Last pap 06/2017, which was NILM  Social History:  Social History   Socioeconomic History  . Marital status: Married    Spouse name: Not on file  . Number of children: 1  . Years of education: Not on file  . Highest education level: Not on file  Occupational History  . 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 Financial resource strain: Not on file  . Food insecurity:    Worry: Not on file    Inability: Not on file  . Transportation needs:    Medical: Not on file    Non-medical: Not on file  Tobacco Use  . Smoking status: Never Smoker  . Smokeless tobacco: Never Used  Substance and Sexual Activity  . Alcohol use: Yes    Comment: rare  . Drug use: No  . Sexual activity: Yes    Partners: Male    Birth control/protection: None  Lifestyle  . Physical activity:    Days per  week: Not on file    Minutes per session: Not on file  . Stress: Not on file  Relationships  . Social connections:    Talks on phone: Not on file    Gets together: Not on file    Attends religious service: Not on file    Active member of club or organization: Not on file    Attends meetings of clubs or organizations: Not on file    Relationship status: Not on file  . Intimate partner violence:    Fear of current or ex partner: Not on file    Emotionally abused: Not on file    Physically abused: Not on file    Forced sexual activity: Not on file  Other Topics Concern  . Not on file  Social History Narrative  . Not on file    Family History:  Family History  Problem Relation Age of Onset  . Diabetes Paternal  Grandmother   . Heart disease Neg Hx   . Hypertension Neg Hx   . Cancer Neg Hx        breast or colon cancer    Medications Anselm Lis had no medications administered during this visit. Current Outpatient Medications  Medication Sig Dispense Refill  . Ferrous Sulfate (IRON) 325 (65 Fe) MG TABS Take 1 tablet (325 mg total) by mouth daily. 30 each 11  . ibuprofen (ADVIL,MOTRIN) 400 MG tablet Take 1 tablet (400 mg total) by mouth every 6 (six) hours as needed for moderate pain (may also use for fever unresponsive to Tylenol). 30 tablet 0  . megestrol (MEGACE) 40 MG tablet Take 2 tablets (80 mg total) by mouth 2 (two) times daily. 120 tablet 5  . Multiple Vitamin (MULTIVITAMIN WITH MINERALS) TABS tablet Take 1 tablet by mouth daily.    Alveda Reasons 20 MG TABS tablet TAKE 1 TABLET (20 MG TOTAL) BY MOUTH DAILY WITH SUPPER. 30 tablet 11  . acetaminophen (TYLENOL) 500 MG tablet Take 1,000 mg by mouth every 6 (six) hours as needed for fever.    . norethindrone (AYGESTIN) 5 MG tablet Take 2 tablets (10 mg total) by mouth daily. 64m po q8h x 3d, then 140mpo q12h x 2d, then 1014mo qday 90 tablet 1   No current facility-administered medications for this visit.    Facility-Administered Medications Ordered in Other Visits  Medication Dose Route Frequency Provider Last Rate Last Dose  . oxyCODONE-acetaminophen (PERCOCET/ROXICET) 5-325 MG per tablet 2 tablet  2 tablet Oral Once TanHarle StanfordPA-C        Allergies Patient has no known allergies.   Physical Exam:  BP 105/74   Pulse 72   Wt 183 lb 9.6 oz (83.3 kg)   BMI 27.11 kg/m  Body mass index is 27.11 kg/m.  General appearance: Well nourished, well developed female in no acute distress.   Laboratory:  CBC Latest Ref Rng & Units 11/28/2017 11/22/2017 11/21/2017  WBC 3.9 - 10.3 K/uL 12.6(H) 10.2 15.1(H)  Hemoglobin 12.0 - 15.0 g/dL - 8.0(L) 8.0(L)  Hematocrit 34.8 - 46.6 % 24.8(L) 26.8(L) 26.5(L)  Platelets 145 - 400 K/uL 361  419(H) 467(H)   Radiology:  CLINICAL DATA:  Abnormal uterine bleeding. Anemia. Fibroids. Evaluation for uterine artery embolization.  EXAM: MRI PELVIS WITHOUT AND WITH CONTRAST  TECHNIQUE: Multiplanar multisequence MR imaging of the pelvis was performed both before and after administration of intravenous contrast.  CONTRAST:  82m75mLTIHANCE GADOBENATE DIMEGLUMINE 529 MG/ML IV SOLN  COMPARISON:  CT on 08/02/2017  FINDINGS: Urinary Tract: No bladder or urethral abnormality identified.  Bowel:  Unremarkable visualized pelvic bowel loops.  Vascular/Lymphatic: No pathologically enlarged lymph nodes or other significant abnormality.  Reproductive:  -- Uterus: Measures 16.9 x 11.3 by 16.5 cm (volume = 1650 cm^3). A few small intramural fibroids are seen in the right and left uterine corpus measuring up to 2 cm in size. Diffuse thickening of the myometrial junctional zone is seen with a large adenomyoma in the posterior uterine corpus and fundus, measuring 10.5 x 6.1 x 12.2 cm. Diffuse endometrial thickening is seen measuring 32 mm, with low T2 signal blood products throughout the endometrial cavity which shows no contrast enhancement.  -- Intracavitary fibroids:  None.  -- Pedunculated fibroids: None.  -- Fibroid contrast enhancement: Small fibroids described above show no significant contrast enhancement, consistent with internal degeneration.  -- Right ovary: Appears normal. A few T1 hyperintense implants are seen in the right adnexa adjacent to the uterine wall, consistent with endometriosis.  -- Left ovary: 2.5 cm T1 hyperintense hemorrhagic left ovarian cyst. A moderate left hematosalpinx is seen which shows T1 hyperintensity and T2 shading.  Other: Small amount of free fluid. Small paraumbilical hernia containing only fat.  Musculoskeletal: Diffuse bone marrow T2 hypointensity, consistent with hemosiderosis.  IMPRESSION: Markedly enlarged  uterus, with large adenomyoma involving the posterior uterine wall.  Approximately 3 small degenerated intramural fibroids measuring approximately 2 cm, which show lack of contrast enhancement.  Diffuse endometrial thickening with blood clot throughout the endometrial cavity.  Large left hematosalpinx.  Mild endometriosis in the right adnexa.   Electronically Signed   By: Earle Gell M.D.   On: 10/22/2017 09:46  Assessment: pt stable  Plan:  1. GYN Patient has never tried aygestin before and d/w to try that to see if that can give her better AUB control and she was amenable to this so she was put on a taper. She is continuing on xarelto. She states that she didn't take it as she should've of leading up to her VTE b/c of the AUB. I told her that given her scenario options are looking limited and I told her that if she doesn't get better AUB control with the aygestin that I'd recommend a 47mcourse of depot lupron. Benefits and risks d/w her and she is amenable to this if the aygestin fails. She'd like to see Dr. AHarolyn Rutherfordagain so I set her up for 4/29 f/u visit with her to see how she's doing and to hopefully come up with a long term plan, which I told her may be to try additional medical therapies or surgery.   RTC 2wks  CDurene RomansMD Attending Center for [employer](Fish farm manager

## 2017-12-11 ENCOUNTER — Other Ambulatory Visit: Payer: BC Managed Care – PPO

## 2017-12-11 ENCOUNTER — Ambulatory Visit: Payer: BC Managed Care – PPO | Admitting: Hematology and Oncology

## 2017-12-11 NOTE — Assessment & Plan Note (Deleted)
Recurrent DVT with antiphospholipid antibodies: First episode: 2007 after undergoing myomectomy, got anticoagulation initially with Coumadin and later with Lovenox for about 12 months ( Lovenox was given during her pregnancy in 2008 and discontinued after she delivered) Second episode: 2009 right leg DVT given Lovenox 6-7 months Third episodeFebruary 2015: Left leg DVT xarelto until November 2016 stopped for heavy bleeding Fourth episode January 2019: Happened when she stopped Xarelto due to heavy menstrual bleed. --------------------------------------------------------------- Repeat testing for lupus anticoagulant in February 2017 was negative. continue lifelong anticoagulation

## 2017-12-11 NOTE — Assessment & Plan Note (Deleted)
Severe iron deficiency anemia Hemoglobin 10/26/2017: 5.7 patient received 2 units of blood and Feraheme Hemoglobin 11/13/2017: Hemoglobin 7.1,  Hemoglobin 11/28/2017: 7.6  Treatment options for heavy menstrual bleeding.:  Scheduled for ablation with her gynecologist  Return to clinic in 1 month with labs done ahead of time and follow-up.

## 2017-12-11 NOTE — Progress Notes (Deleted)
Patient Care Team: Venia Carbon, MD as PCP - General  DIAGNOSIS:  Encounter Diagnoses  Name Primary?  . DEEP VENOUS THROMBOPHLEBITIS, HX OF   . Acute blood loss anemia     CHIEF COMPLIANT: Follow-up of iron deficiency anemia due to blood loss and history of DVT on anticoagulation, recent hospitalization  INTERVAL HISTORY: [name] is a 42 year old with above-mentioned history of heavy uterine bleeding and dysfunctional uterine bleeding assessed with fibroids, antiphospholipid antibody syndrome with multiple previous blood clots on anticoagulation who presented to the hospital with severe abdominal pain was found to have a 13.8 cm large multiloculated abscess which was felt to be tubo-ovarian origin along with bilateral hydronephrosis.  She had a hemoglobin of 6.1 in the hospital and was given packed red cells.  For the sepsis she was treated with antibiotics and the left pelvic drain was placed by interventional radiology on 11/22/2017.  She was placed on oral iron therapy.  REVIEW OF SYSTEMS:   Constitutional: Complains of fatigue dizziness and weakness Eyes: Denies blurriness of vision Ears, nose, mouth, throat, and face: Denies mucositis or sore throat Respiratory: Denies cough, dyspnea or wheezes Cardiovascular: Denies palpitation, chest discomfort Gastrointestinal:  Denies nausea, heartburn or change in bowel habits Skin: Denies abnormal skin rashes Lymphatics: Denies new lymphadenopathy or easy bruising Neurological:Denies numbness, tingling or new weaknesses Behavioral/Psych: Mood is stable, no new changes  Extremities: No lower extremity edema All other systems were reviewed with the patient and are negative.  I have reviewed the past medical history, past surgical history, social history and family history with the patient and they are unchanged from previous note.  ALLERGIES:  has No Known Allergies.  MEDICATIONS:  Current Outpatient Medications    Medication Sig Dispense Refill  . acetaminophen (TYLENOL) 500 MG tablet Take 1,000 mg by mouth every 6 (six) hours as needed for fever.    . Ferrous Sulfate (IRON) 325 (65 Fe) MG TABS Take 1 tablet (325 mg total) by mouth daily. 30 each 11  . ibuprofen (ADVIL,MOTRIN) 400 MG tablet Take 1 tablet (400 mg total) by mouth every 6 (six) hours as needed for moderate pain (may also use for fever unresponsive to Tylenol). 30 tablet 0  . megestrol (MEGACE) 40 MG tablet Take 2 tablets (80 mg total) by mouth 2 (two) times daily. 120 tablet 5  . Multiple Vitamin (MULTIVITAMIN WITH MINERALS) TABS tablet Take 1 tablet by mouth daily.    . norethindrone (AYGESTIN) 5 MG tablet Take 2 tablets (10 mg total) by mouth daily. 53m po q8h x 3d, then 128mpo q12h x 2d, then 1090mo qday 90 tablet 1  . XARELTO 20 MG TABS tablet TAKE 1 TABLET (20 MG TOTAL) BY MOUTH DAILY WITH SUPPER. 30 tablet 11   No current facility-administered medications for this visit.    Facility-Administered Medications Ordered in Other Visits  Medication Dose Route Frequency Provider Last Rate Last Dose  . oxyCODONE-acetaminophen (PERCOCET/ROXICET) 5-325 MG per tablet 2 tablet  2 tablet Oral Once TanHarle StanfordPA-C        PHYSICAL EXAMINATION: ECOG PERFORMANCE STATUS: 1 - Symptomatic but completely ambulatory  There were no vitals filed for this visit. There were no vitals filed for this visit.  GENERAL:alert, no distress and comfortable SKIN: skin color, texture, turgor are normal, no rashes or significant lesions EYES: normal, Conjunctiva are pink and non-injected, sclera clear OROPHARYNX:no exudate, no erythema and lips, buccal mucosa, and tongue normal  NECK: supple, thyroid  normal size, non-tender, without nodularity LYMPH:  no palpable lymphadenopathy in the cervical, axillary or inguinal LUNGS: clear to auscultation and percussion with normal breathing effort HEART: regular rate & rhythm and no murmurs and no lower extremity  edema ABDOMEN:abdomen soft, non-tender and normal bowel sounds MUSCULOSKELETAL:no cyanosis of digits and no clubbing  NEURO: alert & oriented x 3 with fluent speech, no focal motor/sensory deficits EXTREMITIES: No lower extremity edema  LABORATORY DATA:  I have reviewed the data as listed CMP Latest Ref Rng & Units 11/22/2017 11/21/2017 11/20/2017  Glucose 65 - 99 mg/dL 99 101(H) 96  BUN 6 - 20 mg/dL 6 6 5(L)  Creatinine 0.44 - 1.00 mg/dL 0.66 0.62 0.62  Sodium 135 - 145 mmol/L 144 140 140  Potassium 3.5 - 5.1 mmol/L 3.6 3.1(L) 2.9(L)  Chloride 101 - 111 mmol/L 113(H) 108 107  CO2 22 - 32 mmol/L 23 24 25   Calcium 8.9 - 10.3 mg/dL 8.3(L) 8.2(L) 8.0(L)  Total Protein 6.5 - 8.1 g/dL - - -  Total Bilirubin 0.3 - 1.2 mg/dL - - -  Alkaline Phos 38 - 126 U/L - - -  AST 15 - 41 U/L - - -  ALT 14 - 54 U/L - - -    Lab Results  Component Value Date   WBC 12.6 (H) 11/28/2017   HGB 8.0 (L) 11/22/2017   HCT 24.8 (L) 11/28/2017   MCV 87.3 11/28/2017   PLT 361 11/28/2017   NEUTROABS 10.4 (H) 11/28/2017    ASSESSMENT & PLAN:  DEEP VENOUS THROMBOPHLEBITIS, HX OF Recurrent DVT with antiphospholipid antibodies: First episode: 2007 after undergoing myomectomy, got anticoagulation initially with Coumadin and later with Lovenox for about 12 months ( Lovenox was given during her pregnancy in 2008 and discontinued after she delivered) Second episode: 2009 right leg DVT given Lovenox 6-7 months Third episodeFebruary 2015: Left leg DVT xarelto until November 2016 stopped for heavy bleeding Fourth episode January 2019: Happened when she stopped Xarelto due to heavy menstrual bleed. --------------------------------------------------------------- Repeat testing for lupus anticoagulant in February 2017 was negative. continue lifelong anticoagulation   Acute blood loss anemia Severe iron deficiency anemia Hemoglobin 10/26/2017: 5.7 patient received 2 units of blood and Feraheme Hemoglobin 11/13/2017:  Hemoglobin 7.1,  Hemoglobin 11/28/2017: 7.6  Treatment options for heavy menstrual bleeding.:  Scheduled for ablation with her gynecologist  Return to clinic in 1 month with labs done ahead of time and follow-up.      No orders of the defined types were placed in this encounter.  The patient has a good understanding of the overall plan. she agrees with it. she will call with any problems that may develop before the next visit here.   Harriette Ohara, MD 12/11/17

## 2017-12-12 ENCOUNTER — Other Ambulatory Visit: Payer: Self-pay | Admitting: Internal Medicine

## 2017-12-12 ENCOUNTER — Ambulatory Visit
Admission: RE | Admit: 2017-12-12 | Discharge: 2017-12-12 | Disposition: A | Discharge status: Home / Self Care | Payer: BC Managed Care – PPO | Source: Ambulatory Visit | Attending: Internal Medicine | Admitting: Internal Medicine

## 2017-12-12 ENCOUNTER — Encounter: Payer: Self-pay | Admitting: Radiology

## 2017-12-12 DIAGNOSIS — N739 Female pelvic inflammatory disease, unspecified: Secondary | ICD-10-CM

## 2017-12-12 HISTORY — PX: IR RADIOLOGIST EVAL & MGMT: IMG5224

## 2017-12-12 MED ORDER — IOPAMIDOL (ISOVUE-300) INJECTION 61%
100.0000 mL | Freq: Once | INTRAVENOUS | Status: AC | PRN
  Administered 2017-12-12: 100 mL via INTRAVENOUS

## 2017-12-12 NOTE — Progress Notes (Signed)
Referring Physician(s): Letvak,Richard I  Chief Complaint: The patient is seen in follow up today s/p tubo-ovarian abscess. Underwent a left transgluteal abscess drain 11/17/17. A few days later, repeat imaging demonstrated resolution of a deeper pelvic locule but enlargement of the more anterior left pelvic abscess. She subsequently underwent left anterior pelvic drainage and removal of the previous transgluteal drain 11/21/17.  History of present illness:  Pt is feeling better. Flushing drain 5 cc daily OP is brown fluid; 10-15 cc daily Just finished Doxycycline yesterday. To see OBGYN April 29. Was last seen by PMD just 12/06/2017.  Denies fever/chills Denies pain  For CT today and drain injection   Past Medical History:  Diagnosis Date  . Anemia 09/2017   REQUIRING A TRANSFUSION  . Antiphospholipid syndrome ([place])   . Breast cyst    BILATERAL  . DVT (deep venous thrombosis) ([place])   . Dysfunctional uterine bleeding   . Fibroids   . Obesity     Past Surgical History:  Procedure Laterality Date  . CESAREAN SECTION    . DILITATION & CURRETTAGE/HYSTROSCOPY WITH HYDROTHERMAL ABLATION N/A 10/08/2014   Procedure: DILATATION & CURETTAGE/HYSTEROSCOPY WITH HYDROTHERMAL ABLATION;  Surgeon: Osborne Oman, MD;  Location: [facility];  Service: Gynecology;  Laterality: N/A;  . IR RADIOLOGIST EVAL & MGMT  10/10/2017  . IR RADIOLOGIST EVAL & MGMT  11/28/2017  . IR RADIOLOGIST EVAL & MGMT  12/12/2017  . MYOMECTOMY      Allergies: Patient has no known allergies.  Medications: Prior to Admission medications   Medication Sig Start Date End Date Taking? Authorizing Provider  acetaminophen (TYLENOL) 500 MG tablet Take 1,000 mg by mouth every 6 (six) hours as needed for fever.    [provider]  Ferrous Sulfate (IRON) 325 (65 Fe) MG TABS Take 1 tablet (325 mg total) by mouth daily. 10/22/17   Venia Carbon, MD  ibuprofen (ADVIL,MOTRIN) 400 MG tablet Take 1 tablet (400 mg total)  by mouth every 6 (six) hours as needed for moderate pain (may also use for fever unresponsive to Tylenol). 11/23/17   Rosita Fire, MD  megestrol (MEGACE) 40 MG tablet Take 2 tablets (80 mg total) by mouth 2 (two) times daily. 10/08/17   Osborne Oman, MD  Multiple Vitamin (MULTIVITAMIN WITH MINERALS) TABS tablet Take 1 tablet by mouth daily.    [provider]  norethindrone (AYGESTIN) 5 MG tablet Take 2 tablets (10 mg total) by mouth daily. 85m po q8h x 3d, then 162mpo q12h x 2d, then 1042mo qday 12/07/17   PicAletha HalimD  XARELTO 20 MG TABS tablet TAKE 1 TABLET (20 MG TOTAL) BY MOUTH DAILY WITH SUPPER. 09/13/17   LetVenia CarbonD     Family History  Problem Relation Age of Onset  . Diabetes Paternal Grandmother   . Heart disease Neg Hx   . Hypertension Neg Hx   . Cancer Neg Hx        breast or colon cancer    Social History   Socioeconomic History  . Marital status: Married    Spouse name: Not on file  . Number of children: 1  . Years of education: Not on file  . Highest education level: Not on file  Occupational History  . Occupation: [employer]  Social Needs  . Financial resource strain: Not on file  . Food insecurity:    Worry: Not on file  Inability: Not on file  . Transportation needs:    Medical: Not on file    Non-medical: Not on file  Tobacco Use  . Smoking status: Never Smoker  . Smokeless tobacco: Never Used  Substance and Sexual Activity  . Alcohol use: Yes    Comment: rare  . Drug use: No  . Sexual activity: Yes    Partners: Male    Birth control/protection: None  Lifestyle  . Physical activity:    Days per week: Not on file    Minutes per session: Not on file  . Stress: Not on file  Relationships  . Social connections:    Talks on phone: Not on file    Gets together: Not on file    Attends religious service: Not on file    Active member of club or organization: Not on file      Attends meetings of clubs or organizations: Not on file    Relationship status: Not on file  Other Topics Concern  . Not on file  Social History Narrative  . Not on file     Vital Signs: BP 129/84   Temp 98.7 F (37.1 C)   Physical Exam  Neurological: She is alert.  Skin: Skin is warm and dry.  Site is clean and dry OP brown fluid-10 cc in bag since this am   Nursing note and vitals reviewed.  Injection does show small persistent collection at tip of catheter per Dr Earleen Newport  Imaging: Ir Radiologist Eval & Mgmt  Result Date: 12/12/2017 Please refer to notes tab for details about interventional procedure. (Op Note)   Labs:  CBC: Recent Labs    11/19/17 0539 11/20/17 0706 11/21/17 0526 11/22/17 0547 11/28/17 1530  WBC 15.0* 16.5* 15.1* 10.2 12.6*  HGB 7.9* 7.8* 8.0* 8.0*  --   HCT 26.2* 25.6* 26.5* 26.8* 24.8*  PLT 373 391 467* 419* 361    COAGS: Recent Labs    10/02/17 2204 11/16/17 0046  11/16/17 1536 11/16/17 2245 11/17/17 0821 11/17/17 1956 11/21/17 0526  INR 1.97 1.78  --   --   --   --   --  1.23  APTT 46* 42*   < > 41* 47* 66* 59*  --    < > = values in this interval not displayed.    BMP: Recent Labs    11/19/17 0539 11/20/17 0706 11/21/17 0526 11/22/17 0547  NA 137 140 140 144  K 3.3* 2.9* 3.1* 3.6  CL 107 107 108 113*  CO2 23 25 24 23   GLUCOSE 99 96 101* 99  BUN 6 5* 6 6  CALCIUM 7.7* 8.0* 8.2* 8.3*  CREATININE 0.64 0.62 0.62 0.66  GFRNONAA >60 >60 >60 >60  GFRAA >60 >60 >60 >60    LIVER FUNCTION TESTS: Recent Labs    03/08/17 1602 08/02/17 1312 10/02/17 0814 11/15/17 1505  BILITOT 0.2 0.5 0.5 0.6  AST 10 15 14* 23  ALT 7 13* 10* 18  ALKPHOS 56 79 44 77  PROT 7.2 7.8 7.2 7.5  ALBUMIN 3.8 3.9 3.3* 2.2*    Assessment:  Tubo ovarian abscess Was scheduled for Uterine fibroid embolization 10/2017-- but was admitted before procedure with this abscess. Although collection is resloving-- still remains per imaging  today. Will keep drain catheter for now per Dr Earleen Newport Flush 5-10 cc every other day 2 week follow up for Injection only  Pt remains a candidate for UFE--- infection/abscess must be resolved and fully treated first. Pt  is aware and is encouraged.  SignedLavonia Drafts, PA-C 12/12/2017, 1:55 PM   Please refer to Dr. Earleen Newport attestation of this note for management and plan.

## 2017-12-17 ENCOUNTER — Telehealth: Payer: Self-pay | Admitting: Hematology and Oncology

## 2017-12-17 ENCOUNTER — Other Ambulatory Visit: Payer: Self-pay | Admitting: Obstetrics and Gynecology

## 2017-12-17 MED ORDER — TRAMADOL HCL 50 MG PO TABS: 50.0000 mg | ORAL_TABLET | Freq: Four times a day (QID) | ORAL | 0 refills | Status: DC | PRN

## 2017-12-17 NOTE — Telephone Encounter (Signed)
Scheduled appt per 4/17 sch msg - spoke with patient and confirmed appt.

## 2017-12-24 ENCOUNTER — Encounter: Payer: Self-pay | Admitting: Obstetrics & Gynecology

## 2017-12-24 ENCOUNTER — Ambulatory Visit (INDEPENDENT_AMBULATORY_CARE_PROVIDER_SITE_OTHER): Payer: BC Managed Care – PPO | Admitting: Obstetrics & Gynecology

## 2017-12-24 VITALS — BP 116/71 | HR 72 | Wt 181.1 lb

## 2017-12-24 DIAGNOSIS — D219 Benign neoplasm of connective and other soft tissue, unspecified: Secondary | ICD-10-CM

## 2017-12-24 DIAGNOSIS — N7093 Salpingitis and oophoritis, unspecified: Secondary | ICD-10-CM

## 2017-12-24 DIAGNOSIS — N938 Other specified abnormal uterine and vaginal bleeding: Secondary | ICD-10-CM | POA: Diagnosis not present

## 2017-12-24 MED ORDER — TRAMADOL HCL 50 MG PO TABS: 50.0000 mg | ORAL_TABLET | Freq: Four times a day (QID) | ORAL | 0 refills | Status: DC | PRN

## 2017-12-24 MED ORDER — LEUPROLIDE ACETATE (PED) 11.25 MG IM KIT: 11.2500 mg | PACK | INTRAMUSCULAR | 3 refills | Status: DC

## 2017-12-24 NOTE — Progress Notes (Signed)
GYNECOLOGY OFFICE VISIT NOTE  History:  42 y.o. G1P0001 here today for follow up after being started on Aygestin 10 mg po daily for AUB.  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  Patient had been scheduled for UFE prior to admission for TOA and recurrent VTE; it is now on hold pending infection cessation.  She reports bleeding is absent while on Aygestin, satisfied with this modality.  Still reports intermittent pelvic pressure and pain secondary to fibroid.  She denies any abnormal vaginal discharge, bleeding, or other concerns.   Past Medical History:  Diagnosis Date  . Anemia 09/2017   REQUIRING A TRANSFUSION  . Antiphospholipid syndrome ([place])   . Breast cyst    BILATERAL  . DVT (deep venous thrombosis) ([place])   . Dysfunctional uterine bleeding   . Fibroids   . Obesity     Past Surgical History:  Procedure Laterality Date  . CESAREAN SECTION    . DILITATION & CURRETTAGE/HYSTROSCOPY WITH HYDROTHERMAL ABLATION N/A 10/08/2014   Procedure: DILATATION & CURETTAGE/HYSTEROSCOPY WITH HYDROTHERMAL ABLATION;  Surgeon: Osborne Oman, MD;  Location: [facility];  Service: Gynecology;  Laterality: N/A;  . IR RADIOLOGIST EVAL & MGMT  10/10/2017  . IR RADIOLOGIST EVAL & MGMT  11/28/2017  . IR RADIOLOGIST EVAL & MGMT  12/12/2017  . MYOMECTOMY      The following portions of the patient's history were reviewed and updated as appropriate: allergies, current medications, past family history, past medical history, past social history, past surgical history and problem list.   Health Maintenance:  Normal pap and negative HRHPV on 07/17/2017.  Normal mammogram on  09/17/2017.   Review of Systems:  Pertinent items noted in HPI and remainder of comprehensive ROS otherwise negative.  Objective:  Physical Exam BP 116/71   Pulse 72    Wt 181 lb 1.6 oz (82.1 kg)   BMI 26.74 kg/m  CONSTITUTIONAL: Well-developed, well-nourished female in no acute distress.  NECK: Normal range of motion, supple, no masses SKIN: Skin is warm and dry. No rash noted. Not diaphoretic. No erythema. No pallor. NEUROLOGIC: Alert and oriented to person, place, and time. Normal reflexes, muscle tone coordination. No cranial nerve deficit noted. PSYCHIATRIC: Normal mood and affect. Normal behavior. Normal judgment and thought content. CARDIOVASCULAR: Normal heart rate noted RESPIRATORY: Effort and breath sounds normal, no problems with respiration noted ABDOMEN: No overt distention noted.   PELVIC: Deferred MUSCULOSKELETAL: Normal range of motion. No edema noted.  Labs and Imaging Ct Abdomen Pelvis W Contrast  Result Date: 12/12/2017 CLINICAL DATA:  42 year old female with a history of tubo-ovarian abscess, treated with trans gluteal drain 11/17/2017 and anterior approach of residual component 11/21/2017. 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 EXAM: CT ABDOMEN AND PELVIS WITH CONTRAST TECHNIQUE: Multidetector CT imaging of the abdomen and pelvis was performed using the standard protocol following bolus administration of intravenous contrast. CONTRAST:  17m ISOVUE-300 IOPAMIDOL (ISOVUE-300) INJECTION 61% COMPARISON:  11/28/2017 FINDINGS: Lower chest: No acute abnormality. Hepatobiliary: Unremarkable appearance of liver. Unremarkable gallbladder. Pancreas: Unremarkable pancreas Spleen: Unremarkable spleen Adrenals/Urinary Tract: Unremarkable adrenal glands. Bilateral kidneys demonstrate no evidence of hydronephrosis. No nephrolithiasis. Unremarkable course the bilateral ureters. Urinary bladder relatively decompressed. Stomach/Bowel: Unremarkable stomach. Unremarkable small bowel. Moderate stool burden with otherwise unremarkable colon. Appendix  is not visualized,  however, no inflammatory changes are present adjacent to the cecum to indicate an appendicitis. Vascular/Lymphatic: No significant arterial atherosclerotic disease. Redemonstration of infrarenal IVC thrombus, similar extent to the comparison CT study. Thrombus extends inferiorly to the confluence of the bilateral iliac veins. Questionable DVT persisting within the right common iliac vein. Bilateral common femoral veins are patent. There is significant enlargement of the right gonadal vein draining the pelvis to the right of the uterus. Reproductive: Enlarged leiomyomatous uterus. Trace fluid within the endometrial canal. Near complete resolution of the complex rim enhancing fluid collection of the left adnexa. Pigtail drainage catheter remains at the posterior left aspect of the uterus, with small amount of gas adjacent to the catheter, likely secondary to manipulation. There is thin fluid collection just inferior and superior to the drainage catheter. This appears potentially continuous with fluid collection in the left aspect of the pelvis measuring 4.3 cm by 2.9 cm. This rim enhancing fluid collection is medial to the drainage catheter. Other: Fat containing umbilical hernia, with small amount of omental fat. No inflammatory changes. No entrapped bowel. Musculoskeletal: No acute displaced fracture. IMPRESSION: Unchanged pigtail drainage catheter within the left pelvis, with significant improvement in the associated fluid collection. There is small residual measuring 2.9 cm x 4.2 cm adjacent to the catheter, potentially representing undrained abscess. Redemonstration of fibroid uterus, with small fluid in the endometrial canal. Unchanged appearance of IVC thrombus extending into the right iliac venous system. Bilateral common femoral veins remain patent. Electronically Signed   By: Corrie Mckusick D.O.   On: 12/12/2017 15:23   Ct Abdomen Pelvis W Contrast  Result Date: 11/28/2017 CLINICAL DATA:  Follow-up TOA  EXAM: CT ABDOMEN AND PELVIS WITH CONTRAST TECHNIQUE: Multidetector CT imaging of the abdomen and pelvis was performed using the standard protocol following bolus administration of intravenous contrast. CONTRAST:  115m ISOVUE-300 IOPAMIDOL (ISOVUE-300) INJECTION 61% COMPARISON:  11/20/2017 FINDINGS: Lower chest: No acute abnormality. Hepatobiliary: Diffuse hepatic steatosis. Gallbladder is unremarkable Pancreas: Unremarkable Spleen: Unremarkable Adrenals/Urinary Tract: Right-sided hydronephrosis has resolved. Left-sided hydronephrosis has improved. Adrenal glands are within normal limits. Bladder is relatively decompressed. Stomach/Bowel: Prominent stool burden throughout the colon. No evidence of small-bowel obstruction. Stomach is decompressed. Vascular/Lymphatic: Infrarenal IVC thrombus is stable. There are prominent para lumbar veins and the right ovarian vein is prominent. No evidence of aortic aneurysm. Left para-aortic adenopathy likely reflects an inflammatory process. Reproductive: The uterus remains severely enlarged and heterogeneous compatible with fibroids. The previously placed left trans gluteal drain has been removed. A new left lower quadrant anterior placed drain is noted. The large left more anterior pelvic abscess has decompressed. A deeper para rectal abscess has increased in size measuring 5.0 x 3.8 cm. Other tiny locules of fluid are present in the left hemipelvis. Other: No free intraperitoneal gas. Musculoskeletal: No vertebral compression deformity. IMPRESSION: After placement of an anterior left lower quadrant drain, the large more anterior left abscess has completely decompressed. After removal of the left trans gluteal drain, a recurrent deep left pararectal abscess has developed. Stable enlarged fibroid uterus. Stable IVC thrombus. Electronically Signed   By: AMarybelle KillingsM.D.   On: 11/28/2017 12:53   Dg Sinus/fist Tube Chk-non Gi  Result Date: 12/12/2017 INDICATION: 42year old  female with a history percutaneous drain tubo-ovarian abscess. EXAM: IMAGE GUIDED INJECTION OF INDWELLING DRAIN MEDICATIONS: None ANESTHESIA/SEDATION: None COMPLICATIONS: None PROCEDURE: Informed written consent was obtained from the patient after a thorough discussion of the procedural risks, benefits and alternatives. All  questions were addressed. Maximal Sterile Barrier Technique was utilized including caps, mask, sterile gowns, sterile gloves, sterile drape, hand hygiene and skin antiseptic. A timeout was performed prior to the initiation of the procedure. Patient positioned supine position on the fluoro table. Spot image of the abdomen performed. Under fluoroscopy, the indwelling drain was injected with contrast demonstrating small persistent cavity within the left pelvis continuous with the drain. Given the cavity presence, we elected to leave the drain in place. New gravity bag was attached. IMPRESSION: Status post left pelvic drain injection outlining small residual abscess cavity. PLAN: The patient will be scheduled for hospital-based repeat drain injection and possible exchange in 2 weeks. Electronically Signed   By: Corrie Mckusick D.O.   On: 12/12/2017 17:02   Ir Radiologist Eval & Mgmt  Result Date: 12/12/2017 Please refer to notes tab for details about interventional procedure. (Op Note)  Ir Radiologist Eval & Mgmt  Result Date: 11/28/2017 Please refer to notes tab for details about interventional procedure. (Op Note)   Assessment & Plan:  1. Fibroids 2. Tubal ovarian abscess 3. DUB (dysfunctional uterine bleeding) Will continue Aygestin for now.  Discussed definitive surgical management with hysterectomy in detail; discussed difficulties given her history and need to work with her other providers to optimize her for surgery if she desires this intervention.  She does not want hysterectomy; still wants to see if UFE can be done. Will ask IR provider during her appointment.  In the meantime,  also discussed Lupron which may help with fibroid shrinkage and be additive to the UFE's effects. Risks discussed; also discussed recommendation to continue Aygestin for addback therapy if she will be on Lupron long term. She desires this, it was prescribed. Tramadol refill also given for pain.  - traMADol (ULTRAM) 50 MG tablet; Take 1 tablet (50 mg total) by mouth every 6 (six) hours as needed for severe pain.  Dispense: 30 tablet; Refill: 0 - leuprolide (LUPRON) 11.25 MG KIT injection; Inject 11.25 mg into the muscle every 3 (three) months.  Dispense: 1 kit; Refill: 3 Will follow up IR recommendations, patient will also continue to consider definitive management.   Routine preventative health maintenance measures emphasized. Please refer to After Visit Summary for other counseling recommendations.   Return if symptoms worsen or fail to improve.   Total face-to-face time with patient: 25 minutes.  Over 50% of encounter was spent on counseling and coordination of care.   Verita Schneiders, MD, [place] for [employer], [place]

## 2017-12-24 NOTE — Patient Instructions (Signed)
Uterine Artery Embolization for Fibroids Uterine artery embolization is a nonsurgical treatment to shrink fibroids. A thin plastic tube (catheter) is used to inject material that blocks off the blood supply to the fibroid, which causes the fibroid to shrink. Tell a health care provider about:  Any allergies you have.  All medicines you are taking, including vitamins, herbs, eye drops, creams, and over-the-counter medicines.  Any problems you or family members have had with anesthetic medicines.  Any blood disorders you have.  Any surgeries you have had.  Any medical conditions you have. What are the risks?  Injury to the uterus from decreased blood supply  Infection.  Blood infection (septicemia).  Lack of menstrual periods (amenorrhea).  Death of tissue cells (necrosis) around your bladder or vulva.  Development of a hole between organs or from an organ to the surface of your skin (fistula).  Blood clot in the legs (deep vein thrombosis) or lung (pulmonary embolus). What happens before the procedure?  Ask your health care provider about changing or stopping your regular medicines.  Do not take aspirin or blood thinners (anticoagulants) for 1 week before the surgery or as directed by your health care provider.  Do not eat or drink anything for 8 hours before the surgery or as directed by your health care provider.  Empty your bladder before the procedure begins. What happens during the procedure?  An IV tube will be placed into one of your veins. This will be used to give you a sedative and pain medication (conscious sedation).  You will be given a medicine that numbs the area (local anesthetic).  A small cut will be made in your groin. A catheter is then inserted into the main artery of your leg.  The catheter will be guided through the artery to your uterus. A series of images will be taken while dye is injected through the catheter in your groin. X-rays are taken  at the same time. This is done to provide a road map of the blood supply to your uterus and fibroids.  Tiny plastic spheres, about the size of sand grains, will be injected through the catheter. Metal coils may be used to help block the artery. The particles will lodge in tiny branches of the uterine artery that supplies blood to the fibroids.  The procedure is repeated on the artery that supplies the other side of the uterus.  The catheter is then removed and pressure is held to stop any bleeding. No stitches are needed.  A dressing is then placed over the cut (incision). What happens after the procedure?  You will be taken to a recovery area where your progress will be monitored until you are awake, stable, and taking fluids well. If there are no other problems, you will then be moved to a regular hospital room.  You will be observed overnight in the hospital.  You will have cramping that should be controlled with pain medication. This information is not intended to replace advice given to you by your health care provider. Make sure you discuss any questions you have with your health care provider. Document Released: 10/30/2005 Document Revised: 01/20/2016 Document Reviewed: 02/27/2013 Elsevier Interactive Patient Education  2018 [employer].   Hysterectomy Information A hysterectomy is a surgery in which your uterus is removed. This surgery may be done to treat various medical problems. After the surgery, you will no longer have menstrual periods. The surgery will also make you unable to become pregnant (sterile). The  fallopian tubes and ovaries can be removed (bilateral salpingo-oophorectomy) during this surgery as well. Reasons for a hysterectomy  Persistent, abnormal bleeding.  Lasting (chronic) pelvic pain or infection.  The lining of the uterus (endometrium) starts growing outside the uterus (endometriosis).  The endometrium starts growing in the muscle of the uterus  (adenomyosis).  The uterus falls down into the vagina (pelvic organ prolapse).  Noncancerous growths in the uterus (uterine fibroids) that cause symptoms.  Precancerous cells.  Cervical cancer or uterine cancer. Types of hysterectomies  Supracervical hysterectomy-In this type, the top part of the uterus is removed, but not the cervix.  Total hysterectomy-The uterus and cervix are removed.  Radical hysterectomy-The uterus, the cervix, and the fibrous tissue that holds the uterus in place in the pelvis (parametrium) are removed. Ways a hysterectomy can be performed  Abdominal hysterectomy-A large surgical cut (incision) is made in the abdomen. The uterus is removed through this incision.  Vaginal hysterectomy-An incision is made in the vagina. The uterus is removed through this incision. There are no abdominal incisions.  Conventional laparoscopic hysterectomy-Three or four small incisions are made in the abdomen. A thin, lighted tube with a camera (laparoscope) is inserted into one of the incisions. Other tools are put through the other incisions. The uterus is cut into small pieces. The small pieces are removed through the incisions, or they are removed through the vagina.  Laparoscopically assisted vaginal hysterectomy (LAVH)-Three or four small incisions are made in the abdomen. Part of the surgery is performed laparoscopically and part vaginally. The uterus is removed through the vagina.  Robot-assisted laparoscopic hysterectomy-A laparoscope and other tools are inserted into 3 or 4 small incisions in the abdomen. A computer-controlled device is used to give the surgeon a 3D image and to help control the surgical instruments. This allows for more precise movements of surgical instruments. The uterus is cut into small pieces and removed through the incisions or removed through the vagina. What are the risks? Possible complications associated with this procedure include:  Bleeding and  risk of blood transfusion. Tell your health care provider if you do not want to receive any blood products.  Blood clots in the legs or lung.  Infection.  Injury to surrounding organs.  Problems or side effects related to anesthesia.  Conversion to an abdominal hysterectomy from one of the other techniques.  What to expect after a hysterectomy  You will be given pain medicine.  You will need to have someone with you for the first 3-5 days after you go home.  You will need to follow up with your surgeon in 2-4 weeks after surgery to evaluate your progress.  You may have early menopause symptoms such as hot flashes, night sweats, and insomnia.  If you had a hysterectomy for a problem that was not cancer or not a condition that could lead to cancer, then you no longer need Pap tests. However, even if you no longer need a Pap test, a regular exam is a good idea to make sure no other problems are starting. This information is not intended to replace advice given to you by your health care provider. Make sure you discuss any questions you have with your health care provider. Document Released: 02/07/2001 Document Revised: 01/20/2016 Document Reviewed: 04/21/2013 Elsevier Interactive Patient Education  2017 [employer].

## 2017-12-25 ENCOUNTER — Encounter: Payer: Self-pay | Admitting: Obstetrics & Gynecology

## 2017-12-26 ENCOUNTER — Other Ambulatory Visit: Payer: BC Managed Care – PPO

## 2017-12-26 ENCOUNTER — Other Ambulatory Visit: Payer: Self-pay

## 2017-12-26 ENCOUNTER — Encounter (HOSPITAL_COMMUNITY): Payer: Self-pay | Admitting: Interventional Radiology

## 2017-12-26 ENCOUNTER — Ambulatory Visit (HOSPITAL_COMMUNITY)
Admission: RE | Admit: 2017-12-26 | Discharge: 2017-12-26 | Disposition: A | Discharge status: Home / Self Care | Payer: BC Managed Care – PPO | Source: Ambulatory Visit | Attending: Internal Medicine | Admitting: Internal Medicine

## 2017-12-26 DIAGNOSIS — N7093 Salpingitis and oophoritis, unspecified: Secondary | ICD-10-CM | POA: Diagnosis not present

## 2017-12-26 DIAGNOSIS — D649 Anemia, unspecified: Secondary | ICD-10-CM

## 2017-12-26 DIAGNOSIS — Z4803 Encounter for change or removal of drains: Secondary | ICD-10-CM | POA: Diagnosis not present

## 2017-12-26 DIAGNOSIS — N739 Female pelvic inflammatory disease, unspecified: Secondary | ICD-10-CM

## 2017-12-26 HISTORY — PX: IR SINUS/FIST TUBE CHK-NON GI: IMG673

## 2017-12-26 MED ORDER — LIDOCAINE HCL 1 % IJ SOLN
INTRAMUSCULAR | Status: AC
  Filled 2017-12-26: qty 20

## 2017-12-26 MED ORDER — IOPAMIDOL (ISOVUE-300) INJECTION 61%
INTRAVENOUS | Status: AC
  Administered 2017-12-26: 10 mL
  Filled 2017-12-26: qty 50

## 2017-12-26 NOTE — Procedures (Signed)
Pre procedural Dx: Left sided TOA, post CT guided drain placement. Post procedural Dx: Same  Procedure: Drain injection and removal.  EBL: None Complications: None immediate  FINDINGS:  Contrast injection demonstrates presumed opacification of the residual complex left adnexal fluid collection seen on abdominal CT performed 12/12/2017.   PLAN:    Given lack of any substantial output from the existing percutaneous drainage catheter as well as defervescence of clinical symptoms, the decision was made to remove the percutaneous drainage catheter at this time as additional intervention would entail placement/replacement of a left trans gluteal approach percutaneous drainage catheter.   Patient was instructed to not hesitate to call either her referring OB gyn and/or the interventional radiology drain clinic if she were to develop symptoms similar to her initial presentation.   Patient demonstrated excellent understanding of this conversation and is agreement with this proposed plan of care.   Drainage catheter was removed at the patient's bedside without incident.  Ronny Bacon, MD Pager #: [phone]

## 2017-12-27 ENCOUNTER — Encounter: Payer: Self-pay | Admitting: *Deleted

## 2017-12-27 ENCOUNTER — Inpatient Hospital Stay: Payer: BC Managed Care – PPO

## 2017-12-27 ENCOUNTER — Telehealth: Payer: Self-pay | Admitting: Hematology and Oncology

## 2017-12-27 ENCOUNTER — Other Ambulatory Visit: Payer: Self-pay

## 2017-12-27 ENCOUNTER — Inpatient Hospital Stay: Payer: BC Managed Care – PPO | Attending: Hematology and Oncology | Admitting: Hematology and Oncology

## 2017-12-27 VITALS — BP 130/81 | HR 124 | Temp 99.1°F | Resp 17 | Ht 69.0 in | Wt 179.9 lb

## 2017-12-27 DIAGNOSIS — D649 Anemia, unspecified: Secondary | ICD-10-CM

## 2017-12-27 DIAGNOSIS — Z7901 Long term (current) use of anticoagulants: Secondary | ICD-10-CM | POA: Diagnosis not present

## 2017-12-27 DIAGNOSIS — R5383 Other fatigue: Secondary | ICD-10-CM | POA: Diagnosis not present

## 2017-12-27 DIAGNOSIS — N7093 Salpingitis and oophoritis, unspecified: Secondary | ICD-10-CM

## 2017-12-27 DIAGNOSIS — Z79899 Other long term (current) drug therapy: Secondary | ICD-10-CM | POA: Diagnosis not present

## 2017-12-27 DIAGNOSIS — Z8672 Personal history of thrombophlebitis: Secondary | ICD-10-CM | POA: Diagnosis not present

## 2017-12-27 DIAGNOSIS — D259 Leiomyoma of uterus, unspecified: Secondary | ICD-10-CM | POA: Insufficient documentation

## 2017-12-27 DIAGNOSIS — Z86718 Personal history of other venous thrombosis and embolism: Secondary | ICD-10-CM | POA: Insufficient documentation

## 2017-12-27 DIAGNOSIS — D62 Acute posthemorrhagic anemia: Secondary | ICD-10-CM | POA: Diagnosis not present

## 2017-12-27 DIAGNOSIS — D500 Iron deficiency anemia secondary to blood loss (chronic): Secondary | ICD-10-CM | POA: Diagnosis present

## 2017-12-27 LAB — CMP (CANCER CENTER ONLY)
ALT: 11 U/L (ref 0–55)
AST: 14 U/L (ref 5–34)
Albumin: 2.4 g/dL — ABNORMAL LOW (ref 3.5–5.0)
Alkaline Phosphatase: 59 U/L (ref 40–150)
Anion gap: 8 (ref 3–11)
BUN: 4 mg/dL — ABNORMAL LOW (ref 7–26)
CHLORIDE: 104 mmol/L (ref 98–109)
CO2: 27 mmol/L (ref 22–29)
Calcium: 9.2 mg/dL (ref 8.4–10.4)
Creatinine: 0.79 mg/dL (ref 0.60–1.10)
Glucose, Bld: 88 mg/dL (ref 70–140)
POTASSIUM: 3.4 mmol/L — AB (ref 3.5–5.1)
SODIUM: 139 mmol/L (ref 136–145)
Total Bilirubin: 0.3 mg/dL (ref 0.2–1.2)
Total Protein: 7.9 g/dL (ref 6.4–8.3)

## 2017-12-27 LAB — CBC WITH DIFFERENTIAL (CANCER CENTER ONLY)
BASOS ABS: 0 10*3/uL (ref 0.0–0.1)
Basophils Relative: 0 %
EOS ABS: 0.1 10*3/uL (ref 0.0–0.5)
EOS PCT: 1 %
HCT: 25.6 % — ABNORMAL LOW (ref 34.8–46.6)
Hemoglobin: 6.8 g/dL — CL (ref 11.6–15.9)
Lymphocytes Relative: 14 %
Lymphs Abs: 1.6 10*3/uL (ref 0.9–3.3)
MCH: 20.9 pg — ABNORMAL LOW (ref 25.1–34.0)
MCHC: 26.6 g/dL — ABNORMAL LOW (ref 31.5–36.0)
MCV: 78.5 fL — AB (ref 79.5–101.0)
MONO ABS: 1 10*3/uL — AB (ref 0.1–0.9)
Monocytes Relative: 9 %
Neutro Abs: 8.4 10*3/uL — ABNORMAL HIGH (ref 1.5–6.5)
Neutrophils Relative %: 76 %
PLATELETS: 314 10*3/uL (ref 145–400)
RBC: 3.26 MIL/uL — AB (ref 3.70–5.45)
RDW: 21.8 % — AB (ref 11.2–14.5)
WBC: 11.1 10*3/uL — AB (ref 3.9–10.3)

## 2017-12-27 LAB — SAMPLE TO BLOOD BANK

## 2017-12-27 NOTE — Assessment & Plan Note (Signed)
Recurrent DVT with antiphospholipid antibodies: First episode: 2007 after undergoing myomectomy, got anticoagulation initially with Coumadin and later with Lovenox for about 12 months ( Lovenox was given during her pregnancy in 2008 and discontinued after she delivered) Second episode: 2009 right leg DVT given Lovenox 6-7 months Third episodeFebruary 2015: Left leg DVT xarelto until November 2016 stopped for heavy bleeding Fourth episode January 2019: Happened when she stopped Xarelto due to heavy menstrual bleed. --------------------------------------------------------------- Repeat testing for lupus anticoagulant in February 2017 was negative. In spite of this. Recommended lifelong anticoagulation based on recurrent blood clots

## 2017-12-27 NOTE — Telephone Encounter (Signed)
Gave patient AVS and calendar of upcoming appointments.

## 2017-12-27 NOTE — Telephone Encounter (Signed)
Mailed patient calendar of upcoming august appointment updates per 5/2 sch message.

## 2017-12-27 NOTE — Progress Notes (Signed)
Received panic lab regarding Hgb 6.8. Dr. Lindi Adie called and LVM for patient regarding patient needing blood. Scheduling message sent. Orders placed. I left VM for Allie in High Priority to get patient scheduled Friday or Saturday for blood.  Cyndia Bent RN

## 2017-12-27 NOTE — Progress Notes (Signed)
Patient Care Team: Venia Carbon, MD as PCP - General  DIAGNOSIS:  Encounter Diagnoses  Name Primary?  . DEEP VENOUS THROMBOPHLEBITIS, HX OF   . Tubal ovarian abscess     CHIEF COMPLIANT: Follow-up after recent hospitalization for pelvic abscess  INTERVAL HISTORY: [name] is a 42 year old with above-mentioned history of chronic blood loss anemia and DVT currently on anticoagulation who presented with the tubo-ovarian abscess that required drains placed in the hospital she is recovering very well from those procedures.  Denies any further fevers or chills.  Her drains have been removed yesterday.  She is still feels fatigued.    REVIEW OF SYSTEMS:   Constitutional: Fatigue Eyes: Denies blurriness of vision Ears, nose, mouth, throat, and face: Denies mucositis or sore throat Respiratory: Denies cough, dyspnea or wheezes Cardiovascular: Denies palpitation, chest discomfort Gastrointestinal:  Denies nausea, heartburn or change in bowel habits Skin: Denies abnormal skin rashes Lymphatics: Denies new lymphadenopathy or easy bruising Neurological:Denies numbness, tingling or new weaknesses Behavioral/Psych: Mood is stable, no new changes  Extremities: 1+ lower extremity edema  All other systems were reviewed with the patient and are negative.  I have reviewed the past medical history, past surgical history, social history and family history with the patient and they are unchanged from previous note.  ALLERGIES:  has No Known Allergies.  MEDICATIONS:  Current Outpatient Medications  Medication Sig Dispense Refill  . acetaminophen (TYLENOL) 500 MG tablet Take 1,000 mg by mouth every 6 (six) hours as needed for fever.    . Ferrous Sulfate (IRON) 325 (65 Fe) MG TABS Take 1 tablet (325 mg total) by mouth daily. 30 each 11  . ibuprofen (ADVIL,MOTRIN) 400 MG tablet Take 1 tablet (400 mg total) by mouth every 6 (six) hours as needed for moderate pain (may also use for fever  unresponsive to Tylenol). (Patient not taking: Reported on 12/24/2017) 30 tablet 0  . leuprolide (LUPRON) 11.25 MG KIT injection Inject 11.25 mg into the muscle every 3 (three) months. 1 kit 3  . megestrol (MEGACE) 40 MG tablet Take 2 tablets (80 mg total) by mouth 2 (two) times daily. 120 tablet 5  . Multiple Vitamin (MULTIVITAMIN WITH MINERALS) TABS tablet Take 1 tablet by mouth daily.    . norethindrone (AYGESTIN) 5 MG tablet Take 2 tablets (10 mg total) by mouth daily. 3m po q8h x 3d, then 139mpo q12h x 2d, then 1018mo qday 90 tablet 1  . traMADol (ULTRAM) 50 MG tablet Take 1 tablet (50 mg total) by mouth every 6 (six) hours as needed for severe pain. 30 tablet 0  . XARELTO 20 MG TABS tablet TAKE 1 TABLET (20 MG TOTAL) BY MOUTH DAILY WITH SUPPER. 30 tablet 11   No current facility-administered medications for this visit.    Facility-Administered Medications Ordered in Other Visits  Medication Dose Route Frequency Provider Last Rate Last Dose  . oxyCODONE-acetaminophen (PERCOCET/ROXICET) 5-325 MG per tablet 2 tablet  2 tablet Oral Once TanHarle StanfordPA-C        PHYSICAL EXAMINATION: ECOG PERFORMANCE STATUS: 1 - Symptomatic but completely ambulatory  Vitals:   12/27/17 1050  BP: 130/81  Pulse: (!) 124  Resp: 17  Temp: 99.1 F (37.3 C)  SpO2: 100%   Filed Weights   12/27/17 1050  Weight: 179 lb 14.4 oz (81.6 kg)    GENERAL:alert, no distress and comfortable SKIN: skin color, texture, turgor are normal, no rashes or significant lesions EYES: normal, Conjunctiva  are pink and non-injected, sclera clear OROPHARYNX:no exudate, no erythema and lips, buccal mucosa, and tongue normal  NECK: supple, thyroid normal size, non-tender, without nodularity LYMPH:  no palpable lymphadenopathy in the cervical, axillary or inguinal LUNGS: clear to auscultation and percussion with normal breathing effort HEART: regular rate & rhythm and no murmurs and no lower extremity  edema ABDOMEN:abdomen soft, non-tender and normal bowel sounds MUSCULOSKELETAL:no cyanosis of digits and no clubbing  NEURO: alert & oriented x 3 with fluent speech, no focal motor/sensory deficits EXTREMITIES: No lower extremity edema  LABORATORY DATA:  I have reviewed the data as listed CMP Latest Ref Rng & Units 11/22/2017 11/21/2017 11/20/2017  Glucose 65 - 99 mg/dL 99 101(H) 96  BUN 6 - 20 mg/dL 6 6 5(L)  Creatinine 0.44 - 1.00 mg/dL 0.66 0.62 0.62  Sodium 135 - 145 mmol/L 144 140 140  Potassium 3.5 - 5.1 mmol/L 3.6 3.1(L) 2.9(L)  Chloride 101 - 111 mmol/L 113(H) 108 107  CO2 22 - 32 mmol/L _0 Calcium 8.9 - 10.3 mg/dL 8.3(L) 8.2(L) 8.0(L)  Total Protein 6.5 - 8.1 g/dL - - -  Total Bilirubin 0.3 - 1.2 mg/dL - - -  Alkaline Phos 38 - 126 U/L - - -  AST 15 - 41 U/L - - -  ALT 14 - 54 U/L - - -    Lab Results  Component Value Date   WBC 12.6 (H) 11/28/2017   HGB 7.6 (L) 11/28/2017   HCT 24.8 (L) 11/28/2017   MCV 87.3 11/28/2017   PLT 361 11/28/2017   NEUTROABS 10.4 (H) 11/28/2017    ASSESSMENT & PLAN:  DEEP VENOUS THROMBOPHLEBITIS, HX OF Recurrent DVT with antiphospholipid antibodies: First episode: 2007 after undergoing myomectomy, got anticoagulation initially with Coumadin and later with Lovenox for about 12 months ( Lovenox was given during her pregnancy in 2008 and discontinued after she delivered) Second episode: 2009 right leg DVT given Lovenox 6-7 months Third episodeFebruary 2015: Left leg DVT xarelto until November 2016 stopped for heavy bleeding Fourth episode January 2019: Happened when she stopped Xarelto due to heavy menstrual bleed. --------------------------------------------------------------- Repeat testing for lupus anticoagulant in February 2017 was negative. In spite of this. Recommended lifelong anticoagulation based on recurrent blood clots    Tubal ovarian abscess Hospitalization 11/15/2017-11/23/2017 Left pelvic drain placed on 11/22/2017,  left gluteal drain Previous history of dysfunctional uterine bleeding from uterine fibroid: Follows with GYN   Blood loss anemia with iron deficiency: Today's CBC revealed a hemoglobin of 6.8. I recommended giving 2 units of packed red cells. I would like to check iron studies and ferritin as well.  If they are low then she will need to be set up for iron infusion as well.  She is currently taking oral iron daily.  I will see her back in August for CBC and iron studies and follow-up.   Orders Placed This Encounter  Procedures  . CBC with Differential (Cancer Center Only)    Standing Status:   Future    Number of Occurrences:   1    Standing Expiration Date:   12/28/2018  . CMP ([place] only)    Standing Status:   Future    Number of Occurrences:   1    Standing Expiration Date:   12/28/2018   The patient has a good understanding of the overall plan. she agrees with it. she will call with any problems that may develop before the next visit here.  Harriette Ohara, MD 12/27/17

## 2017-12-27 NOTE — Telephone Encounter (Signed)
Called patient regarding 5/4

## 2017-12-27 NOTE — Assessment & Plan Note (Signed)
Hospitalization 11/15/2017-11/23/2017 Left pelvic drain placed on 11/22/2017, left gluteal drain Previous history of dysfunctional uterine bleeding from uterine fibroid: Follows with GYN

## 2017-12-29 ENCOUNTER — Inpatient Hospital Stay: Payer: BC Managed Care – PPO

## 2017-12-29 DIAGNOSIS — D500 Iron deficiency anemia secondary to blood loss (chronic): Secondary | ICD-10-CM | POA: Diagnosis not present

## 2017-12-29 DIAGNOSIS — D649 Anemia, unspecified: Secondary | ICD-10-CM

## 2017-12-29 MED ORDER — DIPHENHYDRAMINE HCL 25 MG PO CAPS
ORAL_CAPSULE | ORAL | Status: AC
Start: 2017-12-29 — End: 2017-12-29
  Filled 2017-12-29: qty 1

## 2017-12-29 MED ORDER — SODIUM CHLORIDE 0.9% FLUSH
3.0000 mL | INTRAVENOUS | Status: DC | PRN
  Filled 2017-12-29: qty 10

## 2017-12-29 MED ORDER — DIPHENHYDRAMINE HCL 25 MG PO CAPS
25.0000 mg | ORAL_CAPSULE | Freq: Once | ORAL | Status: AC
  Administered 2017-12-29: 25 mg via ORAL

## 2017-12-29 MED ORDER — ACETAMINOPHEN 325 MG PO TABS
ORAL_TABLET | ORAL | Status: AC
  Filled 2017-12-29: qty 2

## 2017-12-29 MED ORDER — SODIUM CHLORIDE 0.9 % IV SOLN
250.0000 mL | Freq: Once | INTRAVENOUS | Status: AC
  Administered 2017-12-29: 250 mL via INTRAVENOUS

## 2017-12-29 MED ORDER — ACETAMINOPHEN 325 MG PO TABS
650.0000 mg | ORAL_TABLET | Freq: Once | ORAL | Status: AC
  Administered 2017-12-29: 650 mg via ORAL

## 2017-12-29 NOTE — Patient Instructions (Signed)

## 2017-12-31 LAB — TYPE AND SCREEN
ABO/RH(D): B POS
ANTIBODY SCREEN: POSITIVE
Donor AG Type: NEGATIVE
Donor AG Type: NEGATIVE
Unit division: 0
Unit division: 0

## 2017-12-31 LAB — BPAM RBC
Blood Product Expiration Date: 201905242359
Blood Product Expiration Date: 201905242359
ISSUE DATE / TIME: 201905040839
ISSUE DATE / TIME: 201905040839
UNIT TYPE AND RH: 7300
UNIT TYPE AND RH: 7300

## 2018-01-01 ENCOUNTER — Other Ambulatory Visit: Payer: Self-pay

## 2018-01-01 MED ORDER — NORETHINDRONE ACETATE 5 MG PO TABS: 10.0000 mg | ORAL_TABLET | Freq: Every day | ORAL | 1 refills | Status: DC

## 2018-01-01 NOTE — Telephone Encounter (Signed)
Patient requested a refill on norethindrone be called into pharmacy.

## 2018-01-04 ENCOUNTER — Ambulatory Visit: Payer: BC Managed Care – PPO | Admitting: Internal Medicine

## 2018-01-04 ENCOUNTER — Ambulatory Visit (INDEPENDENT_AMBULATORY_CARE_PROVIDER_SITE_OTHER): Payer: BC Managed Care – PPO

## 2018-01-04 VITALS — BP 131/85 | HR 72

## 2018-01-04 DIAGNOSIS — D259 Leiomyoma of uterus, unspecified: Secondary | ICD-10-CM

## 2018-01-04 DIAGNOSIS — D219 Benign neoplasm of connective and other soft tissue, unspecified: Secondary | ICD-10-CM

## 2018-01-04 MED ORDER — LEUPROLIDE ACETATE (3 MONTH) 11.25 MG IM KIT
11.2500 mg | PACK | Freq: Once | INTRAMUSCULAR | Status: AC
  Administered 2018-01-04: 11.25 mg via INTRAMUSCULAR

## 2018-01-04 NOTE — Progress Notes (Signed)
I have reviewed the chart and agree with nursing staff's documentation of this patient's encounter.  Verita Schneiders, MD 01/04/2018 12:45 PM

## 2018-01-04 NOTE — Progress Notes (Signed)
Patient received Lupron 11.25 in left gluteal area.Vitals were obtain and are within normal range. Injection tolerated well and will follow  up in three month for next injection.

## 2018-01-07 ENCOUNTER — Telehealth: Payer: Self-pay

## 2018-01-07 DIAGNOSIS — D649 Anemia, unspecified: Secondary | ICD-10-CM

## 2018-01-07 NOTE — Telephone Encounter (Signed)
Called pt to return her call regarding setting up a lab appt for her cbc and iron levels. Pt states that she is feeling severely fatigue and weak. She recently saw Dr.Gudena a few weeks ago and ended up with blood transfusion. She was told by MD that she will need iron levels checked  And would like to get this done this week. Set up CBC and iron levels for pt this week. Confirmed time/date with pt. No further needs.

## 2018-01-08 ENCOUNTER — Inpatient Hospital Stay: Payer: BC Managed Care – PPO

## 2018-01-08 DIAGNOSIS — D500 Iron deficiency anemia secondary to blood loss (chronic): Secondary | ICD-10-CM | POA: Diagnosis not present

## 2018-01-08 LAB — IRON AND TIBC
IRON: 29 ug/dL — AB (ref 41–142)
SATURATION RATIOS: 11 % — AB (ref 21–57)
TIBC: 256 ug/dL (ref 236–444)
UIBC: 228 ug/dL

## 2018-01-08 LAB — CBC WITH DIFFERENTIAL (CANCER CENTER ONLY)
BASOS ABS: 0.1 10*3/uL (ref 0.0–0.1)
BASOS PCT: 1 %
EOS ABS: 0.3 10*3/uL (ref 0.0–0.5)
Eosinophils Relative: 3 %
HEMATOCRIT: 33.8 % — AB (ref 34.8–46.6)
HEMOGLOBIN: 10 g/dL — AB (ref 11.6–15.9)
Lymphocytes Relative: 15 %
Lymphs Abs: 1.4 10*3/uL (ref 0.9–3.3)
MCH: 22.7 pg — ABNORMAL LOW (ref 25.1–34.0)
MCHC: 29.6 g/dL — AB (ref 31.5–36.0)
MCV: 76.7 fL — ABNORMAL LOW (ref 79.5–101.0)
Monocytes Absolute: 0.5 10*3/uL (ref 0.1–0.9)
Monocytes Relative: 5 %
NEUTROS ABS: 7.2 10*3/uL — AB (ref 1.5–6.5)
NEUTROS PCT: 76 %
Platelet Count: 544 10*3/uL — ABNORMAL HIGH (ref 145–400)
RBC: 4.4 MIL/uL (ref 3.70–5.45)
RDW: 26.2 % — ABNORMAL HIGH (ref 11.2–14.5)
WBC: 9.5 10*3/uL (ref 3.9–10.3)

## 2018-01-08 LAB — FERRITIN: FERRITIN: 63 ng/mL (ref 9–269)

## 2018-03-12 ENCOUNTER — Encounter: Payer: BC Managed Care – PPO | Admitting: Internal Medicine

## 2018-03-26 ENCOUNTER — Other Ambulatory Visit: Payer: BC Managed Care – PPO

## 2018-03-26 ENCOUNTER — Other Ambulatory Visit: Payer: Self-pay | Admitting: Obstetrics & Gynecology

## 2018-03-28 ENCOUNTER — Ambulatory Visit: Payer: BC Managed Care – PPO | Admitting: Hematology and Oncology

## 2018-04-04 ENCOUNTER — Other Ambulatory Visit: Payer: Self-pay | Admitting: Hematology and Oncology

## 2018-04-04 ENCOUNTER — Inpatient Hospital Stay: Payer: BC Managed Care – PPO

## 2018-04-04 DIAGNOSIS — D500 Iron deficiency anemia secondary to blood loss (chronic): Secondary | ICD-10-CM

## 2018-04-04 HISTORY — DX: Iron deficiency anemia secondary to blood loss (chronic): D50.0

## 2018-04-07 NOTE — Assessment & Plan Note (Deleted)
Recurrent DVT with antiphospholipid antibodies: First episode: 2007 after undergoing myomectomy, got anticoagulation initially with Coumadin and later with Lovenox for about 12 months ( Lovenox was given during her pregnancy in 2008 and discontinued after she delivered) Second episode: 2009 right leg DVT given Lovenox 6-7 months Third episodeFebruary 2015: Left leg DVT xarelto until November 2016 stopped for heavy bleeding Fourth episode January 2019: Happened when she stopped Xarelto due to heavy menstrual bleed. --------------------------------------------------------------- Repeat testing for lupus anticoagulant in February 2017 was negative. In spite of this. Recommended lifelong anticoagulation based on recurrent blood clots

## 2018-04-07 NOTE — Assessment & Plan Note (Deleted)
Blood loss anemia with iron deficiency: Today's CBC revealed a hemoglobin of 6.8. I recommended giving 2 units of packed red cells. I would like to check iron studies and ferritin as well.  If they are low then she will need to be set up for iron infusion as well.  She is currently taking oral iron daily.

## 2018-04-08 ENCOUNTER — Inpatient Hospital Stay: Payer: BC Managed Care – PPO | Admitting: Hematology and Oncology

## 2018-04-08 ENCOUNTER — Inpatient Hospital Stay: Payer: BC Managed Care – PPO

## 2018-04-08 ENCOUNTER — Other Ambulatory Visit: Payer: BC Managed Care – PPO

## 2018-04-08 ENCOUNTER — Telehealth: Payer: Self-pay | Admitting: Hematology and Oncology

## 2018-04-08 NOTE — Telephone Encounter (Signed)
Spoke to patient regarding upcoming aug appts per 8/7 sch message.

## 2018-04-12 ENCOUNTER — Ambulatory Visit (INDEPENDENT_AMBULATORY_CARE_PROVIDER_SITE_OTHER): Payer: BC Managed Care – PPO | Admitting: Internal Medicine

## 2018-04-12 ENCOUNTER — Encounter: Payer: Self-pay | Admitting: Internal Medicine

## 2018-04-12 VITALS — BP 112/78 | HR 99 | Temp 98.1°F | Ht 69.0 in | Wt 190.0 lb

## 2018-04-12 DIAGNOSIS — I83009 Varicose veins of unspecified lower extremity with ulcer of unspecified site: Secondary | ICD-10-CM | POA: Insufficient documentation

## 2018-04-12 DIAGNOSIS — I82409 Acute embolism and thrombosis of unspecified deep veins of unspecified lower extremity: Secondary | ICD-10-CM

## 2018-04-12 DIAGNOSIS — Z0000 Encounter for general adult medical examination without abnormal findings: Secondary | ICD-10-CM

## 2018-04-12 DIAGNOSIS — N938 Other specified abnormal uterine and vaginal bleeding: Secondary | ICD-10-CM

## 2018-04-12 DIAGNOSIS — I83023 Varicose veins of left lower extremity with ulcer of ankle: Secondary | ICD-10-CM

## 2018-04-12 DIAGNOSIS — L97321 Non-pressure chronic ulcer of left ankle limited to breakdown of skin: Secondary | ICD-10-CM

## 2018-04-12 DIAGNOSIS — L97909 Non-pressure chronic ulcer of unspecified part of unspecified lower leg with unspecified severity: Secondary | ICD-10-CM | POA: Insufficient documentation

## 2018-04-12 DIAGNOSIS — Z23 Encounter for immunization: Secondary | ICD-10-CM

## 2018-04-12 NOTE — Progress Notes (Signed)
Subjective:    Patient ID: [name], female    DOB: 05/10/1976, 42 y.o.   MRN: [id number]  HPI Here for physical  Finally pulled tube and not sick No apparent recurrence of tubo-ovarian abscess  lupron did seem to help the bleeding from the fibroids Will be getting an iron infusion next week No one excited about hysterectomy--including her  Feels better Not as fatigued as when she was really anemic  Current Outpatient Medications on File Prior to Visit  Medication Sig Dispense Refill  . acetaminophen (TYLENOL) 500 MG tablet Take 1,000 mg by mouth every 6 (six) hours as needed for fever.    . Ferrous Sulfate (IRON) 325 (65 Fe) MG TABS Take 1 tablet (325 mg total) by mouth daily. 30 each 11  . ibuprofen (ADVIL,MOTRIN) 400 MG tablet Take 1 tablet (400 mg total) by mouth every 6 (six) hours as needed for moderate pain (may also use for fever unresponsive to Tylenol). 30 tablet 0  . leuprolide (LUPRON) 11.25 MG KIT injection Inject 11.25 mg into the muscle every 3 (three) months. 1 kit 3  . norethindrone (AYGESTIN) 5 MG tablet PLEASE SEE ATTACHED FOR DETAILED DIRECTIONS 90 tablet 1  . XARELTO 20 MG TABS tablet TAKE 1 TABLET (20 MG TOTAL) BY MOUTH DAILY WITH SUPPER. 30 tablet 11   Current Facility-Administered Medications on File Prior to Visit  Medication Dose Route Frequency Provider Last Rate Last Dose  . oxyCODONE-acetaminophen (PERCOCET/ROXICET) 5-325 MG per tablet 2 tablet  2 tablet Oral Once Harle Stanford., PA-C        No Known Allergies  Past Medical History:  Diagnosis Date  . Anemia 09/2017   REQUIRING A TRANSFUSION  . Antiphospholipid syndrome ([place])   . Breast cyst    BILATERAL  . DVT (deep venous thrombosis) ([place])   . Dysfunctional uterine bleeding   . Fibroids   . Obesity     Past Surgical History:  Procedure Laterality Date  . CESAREAN SECTION    . DILITATION & CURRETTAGE/HYSTROSCOPY WITH HYDROTHERMAL ABLATION N/A 10/08/2014   Procedure: DILATATION  & CURETTAGE/HYSTEROSCOPY WITH HYDROTHERMAL ABLATION;  Surgeon: Osborne Oman, MD;  Location: [facility];  Service: Gynecology;  Laterality: N/A;  . IR RADIOLOGIST EVAL & MGMT  10/10/2017  . IR RADIOLOGIST EVAL & MGMT  11/28/2017  . IR RADIOLOGIST EVAL & MGMT  12/12/2017  . IR SINUS/FIST TUBE CHK-NON GI  12/26/2017  . MYOMECTOMY      Family History  Problem Relation Age of Onset  . Diabetes Paternal Grandmother   . Heart disease Neg Hx   . Hypertension Neg Hx   . Cancer Neg Hx        breast or colon cancer    Social History   Socioeconomic History  . Marital status: Married    Spouse name: Not on file  . Number of children: 1  . Years of education: Not on file  . Highest education level: Not on file  Occupational History  . 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 Financial resource strain: Not on file  . Food insecurity:    Worry: Not on file    Inability: Not on file  . Transportation needs:    Medical: Not on file    Non-medical: Not on file  Tobacco Use  . Smoking status: Never Smoker  . Smokeless tobacco: Never Used  Substance and Sexual Activity  . Alcohol use: Yes  Comment: rare  . Drug use: No  . Sexual activity: Yes    Partners: Male    Birth control/protection: None  Lifestyle  . Physical activity:    Days per week: Not on file    Minutes per session: Not on file  . Stress: Not on file  Relationships  . Social connections:    Talks on phone: Not on file    Gets together: Not on file    Attends religious service: Not on file    Active member of club or organization: Not on file    Attends meetings of clubs or organizations: Not on file    Relationship status: Not on file  . Intimate partner violence:    Fear of current or ex partner: Not on file    Emotionally abused: Not on file    Physically abused: Not on file    Forced sexual activity: Not on file  Other Topics Concern  . Not on file  Social History  Narrative  . Not on file   Review of Systems  Constitutional:       Weight up slightly with lupron Still exercising Wears seat belt  HENT: Negative for dental problem, hearing loss, tinnitus and trouble swallowing.        Keeps up with dentist  Eyes: Negative for visual disturbance.       No diplopia or unilateral vision loss  Respiratory: Negative for cough, chest tightness and shortness of breath.   Cardiovascular: Positive for palpitations. Negative for chest pain and leg swelling.  Gastrointestinal: Negative for abdominal pain, blood in stool and constipation.       No heartburn  Endocrine: Negative for polydipsia and polyuria.  Genitourinary: Negative for dyspareunia, dysuria and hematuria.  Musculoskeletal: Negative for arthralgias and joint swelling.       Mild back issues 2 months ago--better now  Skin:       Dark spot on lateral left ankle--no ulcer though Doesn't wear hose  Allergic/Immunologic: Negative for environmental allergies and immunocompromised state.  Neurological: Negative for dizziness, syncope, light-headedness and headaches.  Hematological: Negative for adenopathy. Does not bruise/bleed easily.  Psychiatric/Behavioral: Negative for dysphoric mood and sleep disturbance.       Some mood swings on the lupron       Objective:   Physical Exam  Constitutional: She is oriented to person, place, and time. She appears well-developed. No distress.  HENT:  Head: Normocephalic and atraumatic.  Right Ear: External ear normal.  Left Ear: External ear normal.  Mouth/Throat: Oropharynx is clear and moist. No oropharyngeal exudate.  Eyes: Pupils are equal, round, and reactive to light. Conjunctivae are normal.  Neck: No thyromegaly present.  Cardiovascular: Normal rate, regular rhythm, normal heart sounds and intact distal pulses. Exam reveals no gallop.  No murmur heard. Respiratory: Effort normal and breath sounds normal. No respiratory distress. She has no  wheezes. She has no rales.  GI: Soft. She exhibits no distension. There is no tenderness. There is no rebound.  Musculoskeletal: She exhibits no tenderness.  1+ edema left ankle and foot  Lymphadenopathy:    She has no cervical adenopathy.  Neurological: She is alert and oriented to person, place, and time.  Skin:  Stasis changes mostly on left---significant at lateral malleolus with 2 small scabbed ulcers  Psychiatric: She has a normal mood and affect. Her behavior is normal.           Assessment & Plan:

## 2018-04-12 NOTE — Assessment & Plan Note (Signed)
On xarelto

## 2018-04-12 NOTE — Assessment & Plan Note (Signed)
Discussed daily support hose Consider vascular evaluation ([place])--though not likely candidate for procedures

## 2018-04-12 NOTE — Assessment & Plan Note (Signed)
Doing better now Keeps up with mammograms Recommend yearly flu vaccine Td due today Regular exercise

## 2018-04-12 NOTE — Addendum Note (Signed)
Addended by: Pilar Grammes on: 04/12/2018 10:47 AM   Modules accepted: Orders

## 2018-04-12 NOTE — Assessment & Plan Note (Signed)
Better with the lupron High risk for hysterectomy --so not rushing into this

## 2018-04-26 ENCOUNTER — Inpatient Hospital Stay: Payer: BC Managed Care – PPO | Attending: Hematology and Oncology

## 2018-04-26 ENCOUNTER — Telehealth: Payer: Self-pay | Admitting: Hematology and Oncology

## 2018-04-26 ENCOUNTER — Inpatient Hospital Stay: Payer: BC Managed Care – PPO

## 2018-04-26 ENCOUNTER — Encounter: Payer: Self-pay | Admitting: Hematology and Oncology

## 2018-04-26 ENCOUNTER — Inpatient Hospital Stay (HOSPITAL_BASED_OUTPATIENT_CLINIC_OR_DEPARTMENT_OTHER): Payer: BC Managed Care – PPO | Admitting: Adult Health

## 2018-04-26 VITALS — BP 121/85 | HR 87 | Temp 98.9°F | Resp 18

## 2018-04-26 DIAGNOSIS — D500 Iron deficiency anemia secondary to blood loss (chronic): Secondary | ICD-10-CM | POA: Insufficient documentation

## 2018-04-26 DIAGNOSIS — N938 Other specified abnormal uterine and vaginal bleeding: Secondary | ICD-10-CM | POA: Insufficient documentation

## 2018-04-26 DIAGNOSIS — Z79899 Other long term (current) drug therapy: Secondary | ICD-10-CM | POA: Insufficient documentation

## 2018-04-26 DIAGNOSIS — D508 Other iron deficiency anemias: Secondary | ICD-10-CM

## 2018-04-26 DIAGNOSIS — I82409 Acute embolism and thrombosis of unspecified deep veins of unspecified lower extremity: Secondary | ICD-10-CM

## 2018-04-26 DIAGNOSIS — N920 Excessive and frequent menstruation with regular cycle: Secondary | ICD-10-CM | POA: Insufficient documentation

## 2018-04-26 DIAGNOSIS — D259 Leiomyoma of uterus, unspecified: Secondary | ICD-10-CM | POA: Insufficient documentation

## 2018-04-26 DIAGNOSIS — Z86718 Personal history of other venous thrombosis and embolism: Secondary | ICD-10-CM | POA: Insufficient documentation

## 2018-04-26 DIAGNOSIS — Z7901 Long term (current) use of anticoagulants: Secondary | ICD-10-CM

## 2018-04-26 DIAGNOSIS — Z79818 Long term (current) use of other agents affecting estrogen receptors and estrogen levels: Secondary | ICD-10-CM | POA: Insufficient documentation

## 2018-04-26 LAB — CBC WITH DIFFERENTIAL (CANCER CENTER ONLY)
BASOS ABS: 0 10*3/uL (ref 0.0–0.1)
BASOS PCT: 0 %
Eosinophils Absolute: 0.1 10*3/uL (ref 0.0–0.5)
Eosinophils Relative: 2 %
HEMATOCRIT: 26 % — AB (ref 34.8–46.6)
Hemoglobin: 7 g/dL — CL (ref 11.6–15.9)
LYMPHS PCT: 34 %
Lymphs Abs: 1.3 10*3/uL (ref 0.9–3.3)
MCH: 17 pg — ABNORMAL LOW (ref 25.1–34.0)
MCHC: 26.9 g/dL — ABNORMAL LOW (ref 31.5–36.0)
MCV: 63.3 fL — AB (ref 79.5–101.0)
Monocytes Absolute: 0.3 10*3/uL (ref 0.1–0.9)
Monocytes Relative: 6 %
NEUTROS ABS: 2.3 10*3/uL (ref 1.5–6.5)
Neutrophils Relative %: 58 %
Platelet Count: 228 10*3/uL (ref 145–400)
RBC: 4.11 MIL/uL (ref 3.70–5.45)
RDW: 15.9 % — ABNORMAL HIGH (ref 11.2–14.5)
WBC: 4 10*3/uL (ref 3.9–10.3)

## 2018-04-26 LAB — IRON AND TIBC
IRON: 13 ug/dL — AB (ref 41–142)
SATURATION RATIOS: 2 % — AB (ref 21–57)
TIBC: 611 ug/dL — AB (ref 236–444)
UIBC: 598 ug/dL

## 2018-04-26 LAB — SAMPLE TO BLOOD BANK

## 2018-04-26 LAB — FERRITIN: Ferritin: 5 ng/mL — ABNORMAL LOW (ref 11–307)

## 2018-04-26 MED ORDER — SODIUM CHLORIDE 0.9 % IV SOLN
510.0000 mg | Freq: Once | INTRAVENOUS | Status: AC
  Administered 2018-04-26: 510 mg via INTRAVENOUS
  Filled 2018-04-26: qty 17

## 2018-04-26 MED ORDER — SODIUM CHLORIDE 0.9 % IV SOLN
Freq: Once | INTRAVENOUS | Status: AC
  Administered 2018-04-26: 10:00:00 via INTRAVENOUS
  Filled 2018-04-26: qty 250

## 2018-04-26 NOTE — Assessment & Plan Note (Addendum)
Tubal ovarian abscess Hospitalization 11/15/2017-11/23/2017 Left pelvic drain placed on 11/22/2017, left gluteal drain Previous history of dysfunctional uterine bleeding from uterine fibroid: Follows with GYN   Blood loss anemia with iron deficiency: 2 units of blood given for hemoglobin of 6.8 in May 2019  Iron saturation in May 2019 was 11% with ferritin of 63 IV iron was not administered at that time. Patient currently takes oral iron.  Lab review: her hemoglobin is 7 today.  She is asymptomatic.  She will receive IV iron today and in one week.  I reviewed this with her in detail.  I gave her a handout in her AVS that details anemia and signs and symptoms to call for.  If she develops symptomatic anemia she will need a blood transfusion.    Return to clinic in 2 months with labs and follow-up

## 2018-04-26 NOTE — Patient Instructions (Signed)
Anemia Anemia is a condition in which you do not have enough red blood cells or hemoglobin. Hemoglobin is a substance in red blood cells that carries oxygen. When you do not have enough red blood cells or hemoglobin (are anemic), your body cannot get enough oxygen and your organs may not work properly. As a result, you may feel very tired or have other problems. What are the causes? Common causes of anemia include:  Excessive bleeding. Anemia can be caused by excessive bleeding inside or outside the body, including bleeding from the intestine or from periods in women.  Poor nutrition.  Long-lasting (chronic) kidney, thyroid, and liver disease.  Bone marrow disorders.  Cancer and treatments for cancer.  HIV (human immunodeficiency virus) and AIDS (acquired immunodeficiency syndrome).  Treatments for HIV and AIDS.  Spleen problems.  Blood disorders.  Infections, medicines, and autoimmune disorders that destroy red blood cells.  What are the signs or symptoms? Symptoms of this condition include:  Minor weakness.  Dizziness.  Headache.  Feeling heartbeats that are irregular or faster than normal (palpitations).  Shortness of breath, especially with exercise.  Paleness.  Cold sensitivity.  Indigestion.  Nausea.  Difficulty sleeping.  Difficulty concentrating.  Symptoms may occur suddenly or develop slowly. If your anemia is mild, you may not have symptoms. How is this diagnosed? This condition is diagnosed based on:  Blood tests.  Your medical history.  A physical exam.  Bone marrow biopsy.  Your health care provider may also check your stool (feces) for blood and may do additional testing to look for the cause of your bleeding. You may also have other tests, including:  Imaging tests, such as a CT scan or MRI.  Endoscopy.  Colonoscopy.  How is this treated? Treatment for this condition depends on the cause. If you continue to lose a lot of blood,  you may need to be treated at a hospital. Treatment may include:  Taking supplements of iron, vitamin B12, or folic acid.  Taking a hormone medicine (erythropoietin) that can help to stimulate red blood cell growth.  Having a blood transfusion. This may be needed if you lose a lot of blood.  Making changes to your diet.  Having surgery to remove your spleen.  Follow these instructions at home:  Take over-the-counter and prescription medicines only as told by your health care provider.  Take supplements only as told by your health care provider.  Follow any diet instructions that you were given.  Keep all follow-up visits as told by your health care provider. This is important. Contact a health care provider if:  You develop new bleeding anywhere in the body. Get help right away if:  You are very weak.  You are short of breath.  You have pain in your abdomen or chest.  You are dizzy or feel faint.  You have trouble concentrating.  You have bloody or black, tarry stools.  You vomit repeatedly or you vomit up blood. Summary  Anemia is a condition in which you do not have enough red blood cells or enough of a substance in your red blood cells that carries oxygen (hemoglobin).  Symptoms may occur suddenly or develop slowly.  If your anemia is mild, you may not have symptoms.  This condition is diagnosed with blood tests as well as a medical history and physical exam. Other tests may be needed.  Treatment for this condition depends on the cause of the anemia. This information is not intended to replace advice   given to you by your health care provider. Make sure you discuss any questions you have with your health care provider. Document Released: 09/21/2004 Document Revised: 09/15/2016 Document Reviewed: 09/15/2016 Elsevier Interactive Patient Education  [employer].

## 2018-04-26 NOTE — Assessment & Plan Note (Signed)
Recurrent DVT with antiphospholipid antibodies: First episode: 2007 after undergoing myomectomy, got anticoagulation initially with Coumadin and later with Lovenox for about 12 months ( Lovenox was given during her pregnancy in 2008 and discontinued after she delivered) Second episode: 2009 right leg DVT given Lovenox 6-7 months Third episodeFebruary 2015: Left leg DVT xarelto until November 2016 stopped for heavy bleeding Fourth episode January 2019: Happened when she stopped Xarelto due to heavy menstrual bleed. --------------------------------------------------------------- Repeat testing for lupus anticoagulant in February 2017 was negative. In spite of this. Recommended lifelong anticoagulation based on recurrent blood clots

## 2018-04-26 NOTE — Progress Notes (Addendum)
Patient Care Team: Venia Carbon, MD as PCP - General  DIAGNOSIS:  Encounter Diagnoses  Name Primary?  . Recurrent deep vein thrombosis (DVT) ([place])   . Iron deficiency anemia due to chronic blood loss     SUMMARY OF ONCOLOGIC HISTORY:  No history exists.    CHIEF COMPLIANT: iron deficiency, anemia  INTERVAL HISTORY: [name] is here today for follow up of her anemia.  Her hemoglobin is 7 today.  She tells me that she has no symptoms of anemia such as fatigue, DOE, chest pain, palptiations, headache, or any other concerns.    She is due for an iron infusion today.  She has received iron previously on 10/08/2017 and 10/22/2017.  She is following closely with gynecology due to her fibroids and heavy menstrual bleeding.  She has failed an ablation, is not a candidate for uterine emoblization.  She is considering hysterectomy.  She does take Luporon injections.    [name] has h/o DVTs and she is on Xarelto daily.  No other easy bruising or bleeding noted.  REVIEW OF SYSTEMS:   Constitutional: Denies fevers, chills or abnormal weight loss Eyes: Denies blurriness of vision Ears, nose, mouth, throat, and face: Denies mucositis or sore throat Respiratory: Denies cough, dyspnea or wheezes Cardiovascular: Denies palpitation, chest discomfort Gastrointestinal:  Denies nausea, heartburn or change in bowel habits Skin: Denies abnormal skin rashes Lymphatics: Denies new lymphadenopathy or easy bruising Neurological:Denies numbness, tingling or new weaknesses Behavioral/Psych: Mood is stable, no new changes  Extremities: No lower extremity edema All other systems were reviewed with the patient and are negative.  I have reviewed the past medical history, past surgical history, social history and family history with the patient and they are unchanged from previous note.  ALLERGIES:  has No Known Allergies.  MEDICATIONS:  Current Outpatient Medications  Medication Sig Dispense  Refill  . acetaminophen (TYLENOL) 500 MG tablet Take 1,000 mg by mouth every 6 (six) hours as needed for fever.    . Ferrous Sulfate (IRON) 325 (65 Fe) MG TABS Take 1 tablet (325 mg total) by mouth daily. 30 each 11  . ibuprofen (ADVIL,MOTRIN) 400 MG tablet Take 1 tablet (400 mg total) by mouth every 6 (six) hours as needed for moderate pain (may also use for fever unresponsive to Tylenol). 30 tablet 0  . leuprolide (LUPRON) 11.25 MG KIT injection Inject 11.25 mg into the muscle every 3 (three) months. 1 kit 3  . norethindrone (AYGESTIN) 5 MG tablet PLEASE SEE ATTACHED FOR DETAILED DIRECTIONS 90 tablet 1  . XARELTO 20 MG TABS tablet TAKE 1 TABLET (20 MG TOTAL) BY MOUTH DAILY WITH SUPPER. 30 tablet 11   No current facility-administered medications for this visit.    Facility-Administered Medications Ordered in Other Visits  Medication Dose Route Frequency Provider Last Rate Last Dose  . oxyCODONE-acetaminophen (PERCOCET/ROXICET) 5-325 MG per tablet 2 tablet  2 tablet Oral Once Harle Stanford., PA-C        PHYSICAL EXAMINATION: ECOG PERFORMANCE STATUS: 1 - Symptomatic but completely ambulatory  Vitals:   04/26/18 0839  BP: 135/83  Pulse: 91  Resp: 18  Temp: 98.1 F (36.7 C)  SpO2: 100%   Filed Weights   04/26/18 0839  Weight: 188 lb 1.6 oz (85.3 kg)    GENERAL:alert, no distress and comfortable SKIN: skin color, texture, turgor are normal, no rashes or significant lesions EYES: normal, Conjunctiva are pink and non-injected, sclera clear OROPHARYNX:no exudate, no erythema and lips, buccal  mucosa, and tongue normal  NECK: supple, thyroid normal size, non-tender, without nodularity LYMPH:  no palpable lymphadenopathy in the cervical, axillary or inguinal LUNGS: clear to auscultation and percussion with normal breathing effort HEART: regular rate & rhythm and no murmurs and no lower extremity edema ABDOMEN:abdomen soft, non-tender and normal bowel sounds MUSCULOSKELETAL:no cyanosis  of digits and no clubbing  NEURO: alert & oriented x 3 with fluent speech, no focal motor/sensory deficits EXTREMITIES: No lower extremity edema  LABORATORY DATA:  I have reviewed the data as listed CMP Latest Ref Rng & Units 12/27/2017 11/22/2017 11/21/2017  Glucose 70 - 140 mg/dL 88 99 101(H)  BUN 7 - 26 mg/dL 4(L) 6 6  Creatinine 0.60 - 1.10 mg/dL 0.79 0.66 0.62  Sodium 136 - 145 mmol/L 139 144 140  Potassium 3.5 - 5.1 mmol/L 3.4(L) 3.6 3.1(L)  Chloride 98 - 109 mmol/L 104 113(H) 108  CO2 22 - 29 mmol/L _0 Calcium 8.4 - 10.4 mg/dL 9.2 8.3(L) 8.2(L)  Total Protein 6.4 - 8.3 g/dL 7.9 - -  Total Bilirubin 0.2 - 1.2 mg/dL 0.3 - -  Alkaline Phos 40 - 150 U/L 59 - -  AST 5 - 34 U/L 14 - -  ALT 0 - 55 U/L 11 - -    Lab Results  Component Value Date   WBC 4.0 04/26/2018   HGB 7.0 (LL) 04/26/2018   HCT 26.0 (L) 04/26/2018   MCV 63.3 (L) 04/26/2018   PLT 228 04/26/2018   NEUTROABS 2.3 04/26/2018    ASSESSMENT & PLAN:  Recurrent deep vein thrombosis (DVT) ([facility]) Recurrent DVT with antiphospholipid antibodies: First episode: 2007 after undergoing myomectomy, got anticoagulation initially with Coumadin and later with Lovenox for about 12 months ( Lovenox was given during her pregnancy in 2008 and discontinued after she delivered) Second episode: 2009 right leg DVT given Lovenox 6-7 months Third episodeFebruary 2015: Left leg DVT xarelto until November 2016 stopped for heavy bleeding Fourth episode January 2019: Happened when she stopped Xarelto due to heavy menstrual bleed. --------------------------------------------------------------- Repeat testing for lupus anticoagulant in February 2017 was negative. In spite of this. Recommended lifelong anticoagulation based on recurrent blood clots  Iron deficiency anemia due to chronic blood loss Tubal ovarian abscess Hospitalization 11/15/2017-11/23/2017 Left pelvic drain placed on 11/22/2017, left gluteal drain Previous history of  dysfunctional uterine bleeding from uterine fibroid: Follows with GYN   Blood loss anemia with iron deficiency: 2 units of blood given for hemoglobin of 6.8 in May 2019  Iron saturation in May 2019 was 11% with ferritin of 63 IV iron was not administered at that time. Patient currently takes oral iron.  Lab review: her hemoglobin is 7 today.  She is asymptomatic.  She will receive IV iron today and in one week.  I reviewed this with her in detail.  I gave her a handout in her AVS that details anemia and signs and symptoms to call for.  If she develops symptomatic anemia she will need a blood transfusion.    Return to clinic in 2 months with labs and follow-up   The patient has a good understanding of the overall plan. she agrees with it. she will call with any problems that may develop before the next visit here.   Scot Dock, NP 04/26/18 Attending Note  I personally saw and examined Anselm Lis. The plan of care was discussed with her. I agree with the assessment and plan as documented above. Patient is once  again severely anemic and will require IV iron based on severe iron deficiency anemia. She is not very symptomatic and therefore we decided to hold off on blood transfusions. The cause of the bleeding is uterine bleeding from fibroids.  She is currently on Zoladex injections to stop that process. Unfortunately we could not find her to give her IV iron infusion today.  We will set her up to receive IV iron treatment in the near future. We will need to see her in 2 months for follow-up. Signed Harriette Ohara, MD

## 2018-04-26 NOTE — Patient Instructions (Signed)

## 2018-04-26 NOTE — Telephone Encounter (Signed)
Pt requested labs to be on same day due to work sched. Gave pt avs and calendar

## 2018-05-04 ENCOUNTER — Inpatient Hospital Stay: Payer: BC Managed Care – PPO | Attending: Hematology and Oncology

## 2018-05-04 VITALS — BP 124/85 | HR 79 | Temp 98.4°F | Resp 18

## 2018-05-04 DIAGNOSIS — N920 Excessive and frequent menstruation with regular cycle: Secondary | ICD-10-CM | POA: Insufficient documentation

## 2018-05-04 DIAGNOSIS — N938 Other specified abnormal uterine and vaginal bleeding: Secondary | ICD-10-CM | POA: Diagnosis not present

## 2018-05-04 DIAGNOSIS — D259 Leiomyoma of uterus, unspecified: Secondary | ICD-10-CM | POA: Diagnosis not present

## 2018-05-04 DIAGNOSIS — D500 Iron deficiency anemia secondary to blood loss (chronic): Secondary | ICD-10-CM | POA: Diagnosis not present

## 2018-05-04 DIAGNOSIS — D62 Acute posthemorrhagic anemia: Secondary | ICD-10-CM

## 2018-05-04 MED ORDER — SODIUM CHLORIDE 0.9 % IV SOLN
Freq: Once | INTRAVENOUS | Status: AC
  Administered 2018-05-04: 08:00:00 via INTRAVENOUS
  Filled 2018-05-04: qty 250

## 2018-05-04 MED ORDER — SODIUM CHLORIDE 0.9 % IV SOLN
510.0000 mg | Freq: Once | INTRAVENOUS | Status: AC
  Administered 2018-05-04: 510 mg via INTRAVENOUS
  Filled 2018-05-04: qty 17

## 2018-05-04 NOTE — Patient Instructions (Signed)

## 2018-05-07 ENCOUNTER — Ambulatory Visit: Payer: BC Managed Care – PPO | Admitting: Family Medicine

## 2018-05-07 ENCOUNTER — Encounter: Payer: Self-pay | Admitting: Family Medicine

## 2018-05-07 VITALS — BP 126/84 | HR 86 | Temp 98.4°F | Ht 69.0 in | Wt 187.2 lb

## 2018-05-07 DIAGNOSIS — I82409 Acute embolism and thrombosis of unspecified deep veins of unspecified lower extremity: Secondary | ICD-10-CM | POA: Diagnosis not present

## 2018-05-07 DIAGNOSIS — L97322 Non-pressure chronic ulcer of left ankle with fat layer exposed: Secondary | ICD-10-CM

## 2018-05-07 DIAGNOSIS — I83023 Varicose veins of left lower extremity with ulcer of ankle: Secondary | ICD-10-CM | POA: Diagnosis not present

## 2018-05-07 NOTE — Patient Instructions (Signed)
Unna boot placed today Keep on until follow up here or until seen by wound clinic.  See our referral coordinators today to schedule appointment with wound clinic.  If unable to get in to wound clinic by next week, return Monday for follow up visit here.  Good to see you today, call us with questions.

## 2018-05-07 NOTE — Progress Notes (Signed)
BP 126/84 (BP Location: Left Arm, Patient Position: Sitting, Cuff Size: Normal)   Pulse 86   Temp 98.4 F (36.9 C) (Oral)   Ht _0  (1.753 m)   Wt 187 lb 4 oz (84.9 kg)   SpO2 100%   BMI 27.65 kg/m    CC: check skin L lower leg Subjective:    Patient ID: [name], female    DOB: 07-13-1976, 42 y.o.   MRN: [id number]  HPI: [name] is a 42 y.o. female presenting on 05/07/2018 for Skin Lesion (C/o ulcer on medial lower left leg near the ankle. Noticed it this past summer. Was healing but now has drainage and is swollen.  )   L leg swelling worsened over the past weekend, then when she took bandage off leg some skin came off chronic area on left lower leg above ankle, now with persistent open spot/drainage  No fevers/chills.  She regularly wears compression stockings.   No increased salt recently.   H/o recurrent DVT (x4) on lifelong xarelto 43m daily. H/o venous stasis ulcer.   Relevant past medical, surgical, family and social history reviewed and updated as indicated. Interim medical history since our last visit reviewed. Allergies and medications reviewed and updated. Outpatient Medications Prior to Visit  Medication Sig Dispense Refill  . leuprolide (LUPRON) 11.25 MG KIT injection Inject 11.25 mg into the muscle every 3 (three) months. 1 kit 3  . norethindrone (AYGESTIN) 5 MG tablet PLEASE SEE ATTACHED FOR DETAILED DIRECTIONS 90 tablet 1  . XARELTO 20 MG TABS tablet TAKE 1 TABLET (20 MG TOTAL) BY MOUTH DAILY WITH SUPPER. 30 tablet 11  . acetaminophen (TYLENOL) 500 MG tablet Take 1,000 mg by mouth every 6 (six) hours as needed for fever.    . Ferrous Sulfate (IRON) 325 (65 Fe) MG TABS Take 1 tablet (325 mg total) by mouth daily. 30 each 11  . ibuprofen (ADVIL,MOTRIN) 400 MG tablet Take 1 tablet (400 mg total) by mouth every 6 (six) hours as needed for moderate pain (may also use for fever unresponsive to Tylenol). 30 tablet 0   Facility-Administered  Medications Prior to Visit  Medication Dose Route Frequency Provider Last Rate Last Dose  . oxyCODONE-acetaminophen (PERCOCET/ROXICET) 5-325 MG per tablet 2 tablet  2 tablet Oral Once THarle Stanford, PA-C         Per HPI unless specifically indicated in ROS section below Review of Systems     Objective:    BP 126/84 (BP Location: Left Arm, Patient Position: Sitting, Cuff Size: Normal)   Pulse 86   Temp 98.4 F (36.9 C) (Oral)   Ht _1  (1.753 m)   Wt 187 lb 4 oz (84.9 kg)   SpO2 100%   BMI 27.65 kg/m   Wt Readings from Last 3 Encounters:  05/07/18 187 lb 4 oz (84.9 kg)  04/26/18 188 lb 1.6 oz (85.3 kg)  04/12/18 190 lb (86.2 kg)    Physical Exam  Constitutional: She appears well-developed and well-nourished. No distress.  Musculoskeletal: She exhibits edema (tr pitting LLE).  Compression stockings in place 2+ DP bilaterally  Skin: Capillary refill takes less than 2 seconds. Rash noted. No erythema.  Venous stasis ulcers x2 to lower LLE just above ankle, lateral measureing 1cm x 0.8cm, medial measuring 1cm x 0.5cm, hyperkeratotic dry skin surrounding wounds without induration or erythema  Nursing note and vitals reviewed.        Assessment & Plan:   Problem List Items  Addressed This Visit    Venous stasis ulcer ([place]) - Primary    New skin breakdown lateral and superior to ankle - anticipate venous stasis related. Treat with unna boot to leg today, will refer to wound clinic. Consider [facility]VS eval once wound healing. Letter for work provided today. F/u with wound clinic, if unable to expedite appt, recommend f/u with [place] on Friday or Monday for unna boot check.       Relevant Orders   Ambulatory referral to Wound Clinic   Recurrent deep vein thrombosis (DVT) ([place])    She continues lifelong anticoagulation.           No orders of the defined types were placed in this encounter.  Orders Placed This Encounter  Procedures  . Ambulatory referral to Wound Clinic     Referral Priority:   Urgent    Referral Type:   Consultation    Referral Reason:   Specialty Services Required    Requested Specialty:   Wound Care    Number of Visits Requested:   1    Follow up plan: No follow-ups on file.  Ria Bush, MD

## 2018-05-07 NOTE — Assessment & Plan Note (Signed)
New skin breakdown lateral and superior to ankle - anticipate venous stasis related. Treat with unna boot to leg today, will refer to wound clinic. Consider [facility]VS eval once wound healing. Letter for work provided today. F/u with wound clinic, if unable to expedite appt, recommend f/u with [place] on Friday or Monday for unna boot check.

## 2018-05-07 NOTE — Assessment & Plan Note (Signed)
She continues lifelong anticoagulation.

## 2018-05-13 ENCOUNTER — Ambulatory Visit: Payer: BC Managed Care – PPO | Admitting: Family Medicine

## 2018-05-13 ENCOUNTER — Encounter: Payer: Self-pay | Admitting: Family Medicine

## 2018-05-13 VITALS — BP 122/82 | HR 86 | Temp 98.6°F | Ht 69.0 in | Wt 186.5 lb

## 2018-05-13 DIAGNOSIS — I83023 Varicose veins of left lower extremity with ulcer of ankle: Secondary | ICD-10-CM

## 2018-05-13 DIAGNOSIS — L03116 Cellulitis of left lower limb: Secondary | ICD-10-CM

## 2018-05-13 DIAGNOSIS — I82409 Acute embolism and thrombosis of unspecified deep veins of unspecified lower extremity: Secondary | ICD-10-CM

## 2018-05-13 DIAGNOSIS — D500 Iron deficiency anemia secondary to blood loss (chronic): Secondary | ICD-10-CM

## 2018-05-13 DIAGNOSIS — L97322 Non-pressure chronic ulcer of left ankle with fat layer exposed: Secondary | ICD-10-CM

## 2018-05-13 MED ORDER — CEPHALEXIN 500 MG PO CAPS: 500.0000 mg | ORAL_CAPSULE | Freq: Three times a day (TID) | ORAL | 0 refills | Status: DC

## 2018-05-13 NOTE — Progress Notes (Signed)
BP 122/82 (BP Location: Left Arm, Patient Position: Sitting, Cuff Size: Normal)   Pulse 86   Temp 98.6 F (37 C) (Oral)   Ht 5' 9" (1.753 m)   Wt 186 lb 8 oz (84.6 kg)   SpO2 99%   BMI 27.54 kg/m    CC: f/u venous stasis ulcer check Subjective:    Patient ID: [name], female    DOB: 1975-08-29, 42 y.o.   MRN: [id number]  HPI: CARL BLEECKER is a 42 y.o. female presenting on 05/13/2018 for Wound Check (Here for 6 day f/u.)   See prior note for details.  Known chronic venous insufficiency s/p several recurrent DVTs and h/o venous stasis ulcer.   Seen here last week with 1 wk h/o worsening L leg swelling. She is on daily xarelto 17m and has not missed any doses, and regularly wears compression stockings.   Relevant past medical, surgical, family and social history reviewed and updated as indicated. Interim medical history since our last visit reviewed. Allergies and medications reviewed and updated. Outpatient Medications Prior to Visit  Medication Sig Dispense Refill  . leuprolide (LUPRON) 11.25 MG KIT injection Inject 11.25 mg into the muscle every 3 (three) months. 1 kit 3  . norethindrone (AYGESTIN) 5 MG tablet PLEASE SEE ATTACHED FOR DETAILED DIRECTIONS 90 tablet 1  . XARELTO 20 MG TABS tablet TAKE 1 TABLET (20 MG TOTAL) BY MOUTH DAILY WITH SUPPER. 30 tablet 11   Facility-Administered Medications Prior to Visit  Medication Dose Route Frequency Provider Last Rate Last Dose  . oxyCODONE-acetaminophen (PERCOCET/ROXICET) 5-325 MG per tablet 2 tablet  2 tablet Oral Once THarle Stanford, PA-C         Per HPI unless specifically indicated in ROS section below Review of Systems     Objective:    BP 122/82 (BP Location: Left Arm, Patient Position: Sitting, Cuff Size: Normal)   Pulse 86   Temp 98.6 F (37 C) (Oral)   Ht 5' 9" (1.753 m)   Wt 186 lb 8 oz (84.6 kg)   SpO2 99%   BMI 27.54 kg/m   Wt Readings from Last 3 Encounters:  05/13/18 186 lb 8 oz (84.6  kg)  05/07/18 187 lb 4 oz (84.9 kg)  04/26/18 188 lb 1.6 oz (85.3 kg)    Physical Exam  Constitutional: She appears well-developed and well-nourished. No distress.  Musculoskeletal: She exhibits edema.  2+ DP bilaterally  Skin: Capillary refill takes less than 2 seconds. Rash noted. There is erythema.  4 separate areas of venous stasis ulcers to lateral LLE just above ankle, improvement in dry skin surrounding wounds without induration or erythema She does have area of warmth and redness medial mid leg that is tender. Non palpable cords or popliteal fullness  Nursing note and vitals reviewed.    Results for orders placed or performed in visit on 04/26/18  CBC with Differential (Cancer Center Only)  Result Value Ref Range   WBC Count 4.0 3.9 - 10.3 K/uL   RBC 4.11 3.70 - 5.45 MIL/uL   Hemoglobin 7.0 (LL) 11.6 - 15.9 g/dL   HCT 26.0 (L) 34.8 - 46.6 %   MCV 63.3 (L) 79.5 - 101.0 fL   MCH 17.0 (L) 25.1 - 34.0 pg   MCHC 26.9 (L) 31.5 - 36.0 g/dL   RDW 15.9 (H) 11.2 - 14.5 %   Platelet Count 228 145 - 400 K/uL   Neutrophils Relative % 58 %   Neutro Abs 2.3  1.5 - 6.5 K/uL   Lymphocytes Relative 34 %   Lymphs Abs 1.3 0.9 - 3.3 K/uL   Monocytes Relative 6 %   Monocytes Absolute 0.3 0.1 - 0.9 K/uL   Eosinophils Relative 2 %   Eosinophils Absolute 0.1 0.0 - 0.5 K/uL   Basophils Relative 0 %   Basophils Absolute 0.0 0.0 - 0.1 K/uL  Ferritin  Result Value Ref Range   Ferritin 5 (L) 11 - 307 ng/mL  Iron and TIBC  Result Value Ref Range   Iron 13 (L) 41 - 142 ug/dL   TIBC 611 (H) 236 - 444 ug/dL   Saturation Ratios 2 (L) 21 - 57 %   UIBC 598 ug/dL  Sample to Blood Bank  Result Value Ref Range   Blood Bank Specimen SAMPLE AVAILABLE FOR TESTING    Sample Expiration      04/29/2018 Performed at [facility], [place] [address] [place], [place] [address]    Lab Results  Component Value Date   CREATININE 0.79 12/27/2017   BUN 4 (L) 12/27/2017   NA 139 12/27/2017    K 3.4 (L) 12/27/2017   CL 104 12/27/2017   CO2 27 12/27/2017       Assessment & Plan:   Problem List Items Addressed This Visit    Venous stasis ulcer ([place]) - Primary    Persistent with 2 new areas of skin breakdown, but improvement in surrounding dry skin. Replaced unna boot today. She has f/u with wound clinic scheduled for next week. With new erythema, tenderness at medial leg concern for developing cellulitis. Will treat with keflex antibiotic. Update if any concerns in interim. Pt agrees with plan.      Recurrent deep vein thrombosis (DVT) ([facility])    Continues xarelto daily.      Left leg cellulitis   Iron deficiency anemia due to chronic blood loss    This is followed by heme, she received iron infusion last month. asxs anemia.           Meds ordered this encounter  Medications  . cephALEXin (KEFLEX) 500 MG capsule    Sig: Take 1 capsule (500 mg total) by mouth 3 (three) times daily.    Dispense:  21 capsule    Refill:  0   No orders of the defined types were placed in this encounter.   Follow up plan: Return if symptoms worsen or fail to improve.  Ria Bush, MD

## 2018-05-13 NOTE — Assessment & Plan Note (Signed)
Continues xarelto daily.

## 2018-05-13 NOTE — Assessment & Plan Note (Signed)
Persistent with 2 new areas of skin breakdown, but improvement in surrounding dry skin. Replaced unna boot today. She has f/u with wound clinic scheduled for next week. With new erythema, tenderness at medial leg concern for developing cellulitis. Will treat with keflex antibiotic. Update if any concerns in interim. Pt agrees with plan.

## 2018-05-13 NOTE — Patient Instructions (Addendum)
Replace unna boot today Keep on until next week follow up with wound clinic. I think you have skin infection on the inside of the leg - treat with keflex antibiotic.   Cellulitis, Adult Cellulitis is a skin infection. The infected area is usually red and tender. This condition occurs most often in the arms and lower legs. The infection can travel to the muscles, blood, and underlying tissue and become serious. It is very important to get treated for this condition. What are the causes? Cellulitis is caused by bacteria. The bacteria enter through a break in the skin, such as a cut, burn, insect bite, open sore, or crack. What increases the risk? This condition is more likely to occur in people who:  Have a weak defense system (immune system).  Have open wounds on the skin such as cuts, burns, bites, and scrapes. Bacteria can enter the body through these open wounds.  Are older.  Have diabetes.  Have a type of long-lasting (chronic) liver disease (cirrhosis) or kidney disease.  Use IV drugs.  What are the signs or symptoms? Symptoms of this condition include:  Redness, streaking, or spotting on the skin.  Swollen area of the skin.  Tenderness or pain when an area of the skin is touched.  Warm skin.  Fever.  Chills.  Blisters.  How is this diagnosed? This condition is diagnosed based on a medical history and physical exam. You may also have tests, including:  Blood tests.  Lab tests.  Imaging tests.  How is this treated? Treatment for this condition may include:  Medicines, such as antibiotic medicines or antihistamines.  Supportive care, such as rest and application of cold or warm cloths (cold or warm compresses) to the skin.  Hospital care, if the condition is severe.  The infection usually gets better within 1-2 days of treatment. Follow these instructions at home:  Take over-the-counter and prescription medicines only as told by your health care  provider.  If you were prescribed an antibiotic medicine, take it as told by your health care provider. Do not stop taking the antibiotic even if you start to feel better.  Drink enough fluid to keep your urine clear or pale yellow.  Do not touch or rub the infected area.  Raise (elevate) the infected area above the level of your heart while you are sitting or lying down.  Apply warm or cold compresses to the affected area as told by your health care provider.  Keep all follow-up visits as told by your health care provider. This is important. These visits let your health care provider make sure a more serious infection is not developing. Contact a health care provider if:  You have a fever.  Your symptoms do not improve within 1-2 days of starting treatment.  Your bone or joint underneath the infected area becomes painful after the skin has healed.  Your infection returns in the same area or another area.  You notice a swollen bump in the infected area.  You develop new symptoms.  You have a general ill feeling (malaise) with muscle aches and pains. Get help right away if:  Your symptoms get worse.  You feel very sleepy.  You develop vomiting or diarrhea that persists.  You notice red streaks coming from the infected area.  Your red area gets larger or turns dark in color. This information is not intended to replace advice given to you by your health care provider. Make sure you discuss any questions you  have with your health care provider. Document Released: 05/24/2005 Document Revised: 12/23/2015 Document Reviewed: 06/23/2015 Elsevier Interactive Patient Education  [employer].

## 2018-05-13 NOTE — Assessment & Plan Note (Addendum)
This is followed by heme, she received iron infusion last month. asxs anemia.

## 2018-05-21 ENCOUNTER — Encounter (HOSPITAL_BASED_OUTPATIENT_CLINIC_OR_DEPARTMENT_OTHER): Payer: BC Managed Care – PPO | Attending: Internal Medicine

## 2018-05-21 DIAGNOSIS — I87332 Chronic venous hypertension (idiopathic) with ulcer and inflammation of left lower extremity: Secondary | ICD-10-CM | POA: Insufficient documentation

## 2018-05-21 DIAGNOSIS — L97822 Non-pressure chronic ulcer of other part of left lower leg with fat layer exposed: Secondary | ICD-10-CM | POA: Insufficient documentation

## 2018-05-21 DIAGNOSIS — Z86718 Personal history of other venous thrombosis and embolism: Secondary | ICD-10-CM | POA: Diagnosis not present

## 2018-05-21 DIAGNOSIS — Z7901 Long term (current) use of anticoagulants: Secondary | ICD-10-CM | POA: Insufficient documentation

## 2018-05-27 ENCOUNTER — Encounter: Payer: Self-pay | Admitting: Radiology

## 2018-05-29 ENCOUNTER — Encounter (HOSPITAL_BASED_OUTPATIENT_CLINIC_OR_DEPARTMENT_OTHER): Payer: BC Managed Care – PPO | Attending: Physician Assistant

## 2018-05-29 DIAGNOSIS — L97322 Non-pressure chronic ulcer of left ankle with fat layer exposed: Secondary | ICD-10-CM | POA: Diagnosis not present

## 2018-05-29 DIAGNOSIS — I87332 Chronic venous hypertension (idiopathic) with ulcer and inflammation of left lower extremity: Secondary | ICD-10-CM | POA: Diagnosis present

## 2018-05-29 DIAGNOSIS — Z86718 Personal history of other venous thrombosis and embolism: Secondary | ICD-10-CM | POA: Insufficient documentation

## 2018-06-06 DIAGNOSIS — I87332 Chronic venous hypertension (idiopathic) with ulcer and inflammation of left lower extremity: Secondary | ICD-10-CM | POA: Diagnosis not present

## 2018-06-07 ENCOUNTER — Other Ambulatory Visit: Payer: Self-pay

## 2018-06-07 DIAGNOSIS — L97322 Non-pressure chronic ulcer of left ankle with fat layer exposed: Principal | ICD-10-CM

## 2018-06-07 DIAGNOSIS — I83023 Varicose veins of left lower extremity with ulcer of ankle: Secondary | ICD-10-CM

## 2018-06-14 DIAGNOSIS — I87332 Chronic venous hypertension (idiopathic) with ulcer and inflammation of left lower extremity: Secondary | ICD-10-CM | POA: Diagnosis not present

## 2018-06-20 ENCOUNTER — Inpatient Hospital Stay: Payer: BC Managed Care – PPO | Attending: Hematology and Oncology | Admitting: Hematology and Oncology

## 2018-06-20 ENCOUNTER — Inpatient Hospital Stay: Payer: BC Managed Care – PPO

## 2018-06-20 ENCOUNTER — Telehealth: Payer: Self-pay | Admitting: Hematology and Oncology

## 2018-06-20 VITALS — BP 144/97 | HR 91 | Temp 98.9°F | Resp 17 | Ht 69.0 in | Wt 192.1 lb

## 2018-06-20 DIAGNOSIS — Z86718 Personal history of other venous thrombosis and embolism: Secondary | ICD-10-CM | POA: Diagnosis present

## 2018-06-20 DIAGNOSIS — N920 Excessive and frequent menstruation with regular cycle: Secondary | ICD-10-CM | POA: Diagnosis not present

## 2018-06-20 DIAGNOSIS — D500 Iron deficiency anemia secondary to blood loss (chronic): Secondary | ICD-10-CM

## 2018-06-20 DIAGNOSIS — Z7901 Long term (current) use of anticoagulants: Secondary | ICD-10-CM | POA: Insufficient documentation

## 2018-06-20 DIAGNOSIS — I82409 Acute embolism and thrombosis of unspecified deep veins of unspecified lower extremity: Secondary | ICD-10-CM

## 2018-06-20 DIAGNOSIS — I87332 Chronic venous hypertension (idiopathic) with ulcer and inflammation of left lower extremity: Secondary | ICD-10-CM | POA: Diagnosis not present

## 2018-06-20 DIAGNOSIS — D508 Other iron deficiency anemias: Secondary | ICD-10-CM | POA: Diagnosis not present

## 2018-06-20 LAB — CBC WITH DIFFERENTIAL (CANCER CENTER ONLY)
ABS IMMATURE GRANULOCYTES: 0 10*3/uL (ref 0.00–0.07)
BASOS ABS: 0 10*3/uL (ref 0.0–0.1)
BASOS PCT: 0 %
Eosinophils Absolute: 0.1 10*3/uL (ref 0.0–0.5)
Eosinophils Relative: 4 %
HCT: 43 % (ref 36.0–46.0)
HEMOGLOBIN: 12.5 g/dL (ref 12.0–15.0)
Immature Granulocytes: 0 %
Lymphocytes Relative: 43 %
Lymphs Abs: 1.4 10*3/uL (ref 0.7–4.0)
MCH: 21.5 pg — AB (ref 26.0–34.0)
MCHC: 29.1 g/dL — ABNORMAL LOW (ref 30.0–36.0)
MCV: 74 fL — ABNORMAL LOW (ref 80.0–100.0)
MONO ABS: 0.2 10*3/uL (ref 0.1–1.0)
Monocytes Relative: 5 %
NEUTROS PCT: 48 %
Neutro Abs: 1.6 10*3/uL — ABNORMAL LOW (ref 1.7–7.7)
PLATELETS: 274 10*3/uL (ref 150–400)
RBC: 5.81 MIL/uL — AB (ref 3.87–5.11)
RDW: 25.5 % — ABNORMAL HIGH (ref 11.5–15.5)
WBC Count: 3.4 10*3/uL — ABNORMAL LOW (ref 4.0–10.5)
nRBC: 0 % (ref 0.0–0.2)

## 2018-06-20 LAB — IRON AND TIBC
IRON: 51 ug/dL (ref 41–142)
SATURATION RATIOS: 9 % — AB (ref 21–57)
TIBC: 551 ug/dL — ABNORMAL HIGH (ref 236–444)
UIBC: 500 ug/dL

## 2018-06-20 LAB — FERRITIN: FERRITIN: 6 ng/mL — AB (ref 11–307)

## 2018-06-20 NOTE — Telephone Encounter (Signed)
I tried to call the patient on her home phone number and her mobile number. The mobile number says that it cannot accept any voice messages. Patient will need intravenous iron therapy with a very low ferritin level. If she calls [place] back she will need to be set up for IV iron.

## 2018-06-20 NOTE — Assessment & Plan Note (Signed)
Recurrent DVT with antiphospholipid antibodies: First episode: 2007 after undergoing myomectomy, got anticoagulation initially with Coumadin and later with Lovenox for about 12 months ( Lovenox was given during her pregnancy in 2008 and discontinued after she delivered) Second episode: 2009 right leg DVT given Lovenox 6-7 months Third episodeFebruary 2015: Left leg DVT xarelto until November 2016 stopped for heavy bleeding Fourth episode January 2019: Happened when she stopped Xarelto due to heavy menstrual bleed. --------------------------------------------------------------- Repeat testing for lupus anticoagulant in February 2017 was negative. In spite of this.  Recommended lifelong anticoagulation based on recurrent blood clots. Patient is tolerating Xarelto extremely well.

## 2018-06-20 NOTE — Progress Notes (Signed)
Patient Care Team: Venia Carbon, MD as PCP - General  DIAGNOSIS:  Encounter Diagnoses  Name Primary?  . Recurrent deep vein thrombosis (DVT) ([place]) Yes  . Iron deficiency anemia due to chronic blood loss     CHIEF COMPLIANT: Follow-up of iron deficiency anemia and history of recurrent DVTs on anticoagulation  INTERVAL HISTORY: [name] is a 42-year with above-mentioned history of recurrent DVTs on anticoagulation with Xarelto.  She is tolerating Xarelto fairly well.  Recently she has had problems with a varicose vein ulcer that is requiring wound care.  She has appointments with wound care daily.  She was profoundly iron deficient after an episode of heavy bleeding.  We gave her intravenous iron therapy with a ferritin of 5 and a hemoglobin of 7.  Today her hemoglobin is up to 12.5.  Her iron studies are pending.  She has been started on norethindrone which has been helping with heavy menstrual bleeding.  REVIEW OF SYSTEMS:   Constitutional: Denies fevers, chills or abnormal weight loss Eyes: Denies blurriness of vision Ears, nose, mouth, throat, and face: Denies mucositis or sore throat Respiratory: Denies cough, dyspnea or wheezes Cardiovascular: Denies palpitation, chest discomfort Gastrointestinal:  Denies nausea, heartburn or change in bowel habits Skin: Denies abnormal skin rashes Lymphatics: Denies new lymphadenopathy or easy bruising Neurological:Denies numbness, tingling or new weaknesses Behavioral/Psych: Mood is stable, no new changes  Extremities: Compression of lower extremities   All other systems were reviewed with the patient and are negative.  I have reviewed the past medical history, past surgical history, social history and family history with the patient and they are unchanged from previous note.  ALLERGIES:  has No Known Allergies.  MEDICATIONS:  Current Outpatient Medications  Medication Sig Dispense Refill  . norethindrone (AYGESTIN) 5 MG  tablet PLEASE SEE ATTACHED FOR DETAILED DIRECTIONS 90 tablet 1  . XARELTO 20 MG TABS tablet TAKE 1 TABLET (20 MG TOTAL) BY MOUTH DAILY WITH SUPPER. 30 tablet 11   No current facility-administered medications for this visit.    Facility-Administered Medications Ordered in Other Visits  Medication Dose Route Frequency Provider Last Rate Last Dose  . oxyCODONE-acetaminophen (PERCOCET/ROXICET) 5-325 MG per tablet 2 tablet  2 tablet Oral Once Harle Stanford., PA-C        PHYSICAL EXAMINATION: ECOG PERFORMANCE STATUS: 1 - Symptomatic but completely ambulatory  Vitals:   06/20/18 0823  BP: (!) 144/97  Pulse: 91  Resp: 17  Temp: 98.9 F (37.2 C)  SpO2: 100%   Filed Weights   06/20/18 0823  Weight: 192 lb 1.6 oz (87.1 kg)    GENERAL:alert, no distress and comfortable SKIN: skin color, texture, turgor are normal, no rashes or significant lesions EYES: normal, Conjunctiva are pink and non-injected, sclera clear OROPHARYNX:no exudate, no erythema and lips, buccal mucosa, and tongue normal  NECK: supple, thyroid normal size, non-tender, without nodularity LYMPH:  no palpable lymphadenopathy in the cervical, axillary or inguinal LUNGS: clear to auscultation and percussion with normal breathing effort HEART: regular rate & rhythm and no murmurs and no lower extremity edema ABDOMEN:abdomen soft, non-tender and normal bowel sounds MUSCULOSKELETAL:no cyanosis of digits and no clubbing  NEURO: alert & oriented x 3 with fluent speech, no focal motor/sensory deficits EXTREMITIES: No lower extremity edema   LABORATORY DATA:  I have reviewed the data as listed CMP Latest Ref Rng & Units 12/27/2017 11/22/2017 11/21/2017  Glucose 70 - 140 mg/dL 88 99 101(H)  BUN 7 - 26 mg/dL  4(L) 6 6  Creatinine 0.60 - 1.10 mg/dL 0.79 0.66 0.62  Sodium 136 - 145 mmol/L 139 144 140  Potassium 3.5 - 5.1 mmol/L 3.4(L) 3.6 3.1(L)  Chloride 98 - 109 mmol/L 104 113(H) 108  CO2 22 - 29 mmol/L 27 23 24   Calcium 8.4 -  10.4 mg/dL 9.2 8.3(L) 8.2(L)  Total Protein 6.4 - 8.3 g/dL 7.9 - -  Total Bilirubin 0.2 - 1.2 mg/dL 0.3 - -  Alkaline Phos 40 - 150 U/L 59 - -  AST 5 - 34 U/L 14 - -  ALT 0 - 55 U/L 11 - -    Lab Results  Component Value Date   WBC 3.4 (L) 06/20/2018   HGB 12.5 06/20/2018   HCT 43.0 06/20/2018   MCV 74.0 (L) 06/20/2018   PLT 274 06/20/2018   NEUTROABS 1.6 (L) 06/20/2018    ASSESSMENT & PLAN:  Recurrent deep vein thrombosis (DVT) ([facility]) Recurrent DVT with antiphospholipid antibodies: First episode: 2007 after undergoing myomectomy, got anticoagulation initially with Coumadin and later with Lovenox for about 12 months ( Lovenox was given during her pregnancy in 2008 and discontinued after she delivered) Second episode: 2009 right leg DVT given Lovenox 6-7 months Third episodeFebruary 2015: Left leg DVT xarelto until November 2016 stopped for heavy bleeding Fourth episode January 2019: Happened when she stopped Xarelto due to heavy menstrual bleed. --------------------------------------------------------------- Repeat testing for lupus anticoagulant in February 2017 was negative. In spite of this.  Recommended lifelong anticoagulation based on recurrent blood clots. Patient is tolerating Xarelto extremely well.  Iron deficiency anemia due to chronic blood loss Blood loss anemia with iron deficiency: 2 units of blood given for hemoglobin of 6.8 in May 2019 Received IV iron treatment 04/26/2018 and 05/04/2018 Ferritin 04/26/2018: 5 with hemoglobin of 7 and MCV of 63, received IV iron  Lab review 06/20/2018: Hemoglobin 12.5, MCV 74, iron studies are pending I will call the patient and provide her with the results of iron studies and determine if additional iron is needed at this time. Otherwise we will see her back in 6 months with labs and follow-up. Patient is going to [place] for a religious trip.   Orders Placed This Encounter  Procedures  . Ferritin    Standing Status:   Future      Standing Expiration Date:   06/20/2019  . Iron and TIBC    Standing Status:   Future    Standing Expiration Date:   06/20/2019  . CBC with Differential (Cancer Center Only)    Standing Status:   Future    Standing Expiration Date:   06/21/2019   The patient has a good understanding of the overall plan. she agrees with it. she will call with any problems that may develop before the next visit here.   Harriette Ohara, MD 06/20/18

## 2018-06-20 NOTE — Assessment & Plan Note (Signed)
Blood loss anemia with iron deficiency: 2 units of blood given for hemoglobin of 6.8 in May 2019 Received IV iron treatment 04/26/2018 and 05/04/2018 Ferritin 04/26/2018: 5 with hemoglobin of 7 and MCV of 63  Lab review 06/20/2018:

## 2018-06-21 ENCOUNTER — Telehealth: Payer: Self-pay | Admitting: Hematology and Oncology

## 2018-06-21 NOTE — Telephone Encounter (Signed)
Called patient and confirmed her appts for April 2020

## 2018-06-24 ENCOUNTER — Telehealth: Payer: Self-pay

## 2018-06-24 ENCOUNTER — Telehealth: Payer: Self-pay | Admitting: Hematology and Oncology

## 2018-06-24 NOTE — Telephone Encounter (Signed)
Left message for patient to return call regarding questions about iron levels.

## 2018-06-24 NOTE — Telephone Encounter (Signed)
Left message for patient to call back to set up appts for iron infusions per 10/28 sch message./

## 2018-06-24 NOTE — Telephone Encounter (Signed)
Spoke with patient regarding lab results from Iron and Ferritin.  Patient okay to proceed with Iron infusions per Dr. Geralyn Flash recommendations.  Scheduling message sent.

## 2018-06-24 NOTE — Telephone Encounter (Signed)
Called patient per 10/25 sch message - pt unaware that she needed Iron this week - unable to schedule due to availability - request call from RN about iron levels . Relayed message to RN .

## 2018-06-27 ENCOUNTER — Encounter: Payer: Self-pay | Admitting: Vascular Surgery

## 2018-06-27 ENCOUNTER — Other Ambulatory Visit: Payer: Self-pay

## 2018-06-27 ENCOUNTER — Ambulatory Visit (INDEPENDENT_AMBULATORY_CARE_PROVIDER_SITE_OTHER): Payer: BC Managed Care – PPO | Admitting: Vascular Surgery

## 2018-06-27 ENCOUNTER — Ambulatory Visit (HOSPITAL_COMMUNITY)
Admission: RE | Admit: 2018-06-27 | Discharge: 2018-06-27 | Disposition: A | Discharge status: Home / Self Care | Payer: BC Managed Care – PPO | Source: Ambulatory Visit | Attending: Internal Medicine | Admitting: Internal Medicine

## 2018-06-27 VITALS — BP 150/102 | HR 93 | Temp 98.1°F | Resp 18 | Ht 69.0 in | Wt 190.3 lb

## 2018-06-27 DIAGNOSIS — L97329 Non-pressure chronic ulcer of left ankle with unspecified severity: Secondary | ICD-10-CM | POA: Diagnosis not present

## 2018-06-27 DIAGNOSIS — I83023 Varicose veins of left lower extremity with ulcer of ankle: Secondary | ICD-10-CM

## 2018-06-27 DIAGNOSIS — L97322 Non-pressure chronic ulcer of left ankle with fat layer exposed: Secondary | ICD-10-CM

## 2018-06-27 DIAGNOSIS — I872 Venous insufficiency (chronic) (peripheral): Secondary | ICD-10-CM

## 2018-06-27 NOTE — Progress Notes (Signed)
REASON FOR CONSULT:    Chronic venous insufficiency with a venous ulcer of the left leg.  The consult is requested by Dr. Linton Ham.  HPI:   [name] is a pleasant 42 y.o. female, who is referred from the wound care center with a venous ulcer of the left leg.  This patient developed a wound on the lateral aspect of the distal left leg approximately 2 months ago.  The patient has been treated at the wound care center since September and is wearing a compression dressing and the wound has shown significant improvement.  I have reviewed the records that were sent from the wound care center.  I have a note dated 06/06/2018.  At that time the patient had cellulitis of the leg and the wound is being treated with a 3 layer compression dressing.  Patient has history of multiple previous DVTs.  The patient had a left lower extremity DVT approximately 10 years ago after surgery.  Subsequently the patient had a right lower extremity DVT.  Most recently approximately 3 years ago she had a second DVT of the left lower extremity.  She is on Xarelto with plans to continue that indefinitely.  She is unaware of any family history of clotting disorders.  She experiences aching pain and heaviness in her legs which is associated with standing and relieved somewhat with elevation.  She was not wearing compression stockings before the ulcer developed.  She really does not have any significant risk factors for peripheral vascular disease.  She denies any history of diabetes, hypertension, hypercholesterolemia, family history of premature cardiovascular disease or tobacco use.  Past Medical History:  Diagnosis Date  . Anemia 09/2017   REQUIRING A TRANSFUSION  . Antiphospholipid syndrome ([place])   . Breast cyst    BILATERAL  . DVT (deep venous thrombosis) ([place])   . Dysfunctional uterine bleeding   . Fibroids   . Obesity     Family History  Problem Relation Age of Onset  . Diabetes Paternal  Grandmother   . Heart disease Neg Hx   . Hypertension Neg Hx   . Cancer Neg Hx        breast or colon cancer    SOCIAL HISTORY: She is not a smoker. Social History   Socioeconomic History  . Marital status: Married    Spouse name: Not on file  . Number of children: 1  . Years of education: Not on file  . Highest education level: Not on file  Occupational History  . 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 Financial resource strain: Not on file  . Food insecurity:    Worry: Not on file    Inability: Not on file  . Transportation needs:    Medical: Not on file    Non-medical: Not on file  Tobacco Use  . Smoking status: Never Smoker  . Smokeless tobacco: Never Used  Substance and Sexual Activity  . Alcohol use: Yes    Comment: rare  . Drug use: No  . Sexual activity: Yes    Partners: Male    Birth control/protection: None  Lifestyle  . Physical activity:    Days per week: Not on file    Minutes per session: Not on file  . Stress: Not on file  Relationships  . Social connections:    Talks on phone: Not on file    Gets together: Not on file    Attends  religious service: Not on file    Active member of club or organization: Not on file    Attends meetings of clubs or organizations: Not on file    Relationship status: Not on file  . Intimate partner violence:    Fear of current or ex partner: Not on file    Emotionally abused: Not on file    Physically abused: Not on file    Forced sexual activity: Not on file  Other Topics Concern  . Not on file  Social History Narrative  . Not on file    No Known Allergies  Current Outpatient Medications  Medication Sig Dispense Refill  . norethindrone (AYGESTIN) 5 MG tablet PLEASE SEE ATTACHED FOR DETAILED DIRECTIONS 90 tablet 1  . XARELTO 20 MG TABS tablet TAKE 1 TABLET (20 MG TOTAL) BY MOUTH DAILY WITH SUPPER. 30 tablet 11   No current facility-administered medications for this  visit.    Facility-Administered Medications Ordered in Other Visits  Medication Dose Route Frequency Provider Last Rate Last Dose  . oxyCODONE-acetaminophen (PERCOCET/ROXICET) 5-325 MG per tablet 2 tablet  2 tablet Oral Once Harle Stanford., PA-C        REVIEW OF SYSTEMS:  [X]  denotes positive finding, [ ]  denotes negative finding Cardiac  Comments:  Chest pain or chest pressure:    Shortness of breath upon exertion:    Short of breath when lying flat:    Irregular heart rhythm:        Vascular    Pain in calf, thigh, or hip brought on by ambulation:    Pain in feet at night that wakes you up from your sleep:     Blood clot in your veins:    Leg swelling:  x       Pulmonary    Oxygen at home:    Productive cough:     Wheezing:         Neurologic    Sudden weakness in arms or legs:     Sudden numbness in arms or legs:     Sudden onset of difficulty speaking or slurred speech:    Temporary loss of vision in one eye:     Problems with dizziness:         Gastrointestinal    Blood in stool:     Vomited blood:         Genitourinary    Burning when urinating:     Blood in urine:        Psychiatric    Major depression:         Hematologic    Bleeding problems:    Problems with blood clotting too easily:        Skin    Rashes or ulcers: x       Constitutional    Fever or chills:     PHYSICAL EXAM:   Vitals:   06/27/18 1531  BP: (!) 150/102  Pulse: 93  Resp: 18  Temp: 98.1 F (36.7 C)  TempSrc: Oral  SpO2: 98%  Weight: 190 lb 4.8 oz (86.3 kg)  Height: 5' 9"  (1.753 m)    GENERAL: The patient is a well-nourished female, in no acute distress. The vital signs are documented above. CARDIAC: There is a regular rate and rhythm.  VASCULAR:  ARTERIAL: I do not detect carotid bruits. On the left side she has a palpable femoral, popliteal, and dorsalis pedis pulse.  I cannot palpate a posterior tibial pulse however it is  biphasic with the Doppler. On the right side  she has a palpable femoral, popliteal, dorsalis pedis, and posterior tibial pulse. VENOUS:  She has mild bilateral lower extremity swelling.  She has hyperpigmentation of her distal left leg with a venous ulcer on the lateral aspect of her leg adjacent to the lateral malleolus which measures 1 cm in maximum diameter.  Currently there is no significant cellulitis. I did evaluate her great saphenous vein, common femoral vein and femoral vein in the left thigh myself with the SonoSite.  She does have significant reflux in the great saphenous vein which is dilated.  However the femoral vein is non compressible. PULMONARY: There is good air exchange bilaterally without wheezing or rales. ABDOMEN: Soft and non-tender with normal pitched bowel sounds.  MUSCULOSKELETAL: There are no major deformities or cyanosis. NEUROLOGIC: No focal weakness or paresthesias are detected. SKIN: There is a venous ulcer adjacent to the lateral malleolus which measures 1 cm in diameter without significant drainage or cellulitis. PSYCHIATRIC: The patient has a normal affect.  DATA:    VENOUS DUPLEX: I have independently interpreted her venous duplex scan.  This was a study of the left lower extremity and the right common femoral vein.  On the left there was evidence of chronic DVT involving the left common femoral vein, femoral vein, proximal profunda vein, and popliteal vein.  There was reflux noted in the deep system involving the common femoral, femoral and popliteal veins.  There was reflux in the left great saphenous vein throughout the thigh and the vein was significantly dilated with diameters ranging from 0.60-0.66 cm.  On the right side there was chronic DVT in the right common femoral vein.  ASSESSMENT & PLAN:   CHRONIC VENOUS INSUFFICIENCY WITH VENOUS ULCER: This patient has a venous ulcer on her left leg adjacent to the lateral malleolus.  This is improving with the compression dressing and aggressive wound care  by the wound care center.  I did reassure her that she has excellent arterial flow which should not be compromising healing.  She has a palpable dorsalis pedis pulse.  She has a biphasic posterior tibial signal.  She is not diabetic and is not a smoker which certainly helps.  She does have significant venous insufficiency and obstructive disease.  Therefore we have focused on conservative treatment for this including leg elevation and the proper positioning for this.  In addition she should continue her compression dressing but switch to a compression stocking once the wound has healed in order to lower her risk of recurrence.  I have encouraged her to avoid prolonged sitting and standing.  I have encouraged her to ambulate as much as possible.  We also discussed the importance of nutrition.  I do not think she is a candidate for endovenous laser ablation of the left great saphenous vein given that the left femoral vein is noncompressible with significant obstructive disease.  I will be happy to see her back at any time if her symptoms progress.  Deitra Mayo [facility] [phone]

## 2018-06-28 ENCOUNTER — Other Ambulatory Visit: Payer: Self-pay | Admitting: Obstetrics & Gynecology

## 2018-06-28 ENCOUNTER — Inpatient Hospital Stay: Payer: BC Managed Care – PPO | Attending: Hematology and Oncology

## 2018-06-28 VITALS — BP 129/100 | HR 80 | Temp 98.8°F | Resp 17

## 2018-06-28 DIAGNOSIS — N920 Excessive and frequent menstruation with regular cycle: Secondary | ICD-10-CM | POA: Diagnosis not present

## 2018-06-28 DIAGNOSIS — D62 Acute posthemorrhagic anemia: Secondary | ICD-10-CM

## 2018-06-28 DIAGNOSIS — D500 Iron deficiency anemia secondary to blood loss (chronic): Secondary | ICD-10-CM | POA: Diagnosis not present

## 2018-06-28 MED ORDER — SODIUM CHLORIDE 0.9 % IV SOLN
510.0000 mg | Freq: Once | INTRAVENOUS | Status: AC
  Administered 2018-06-28: 510 mg via INTRAVENOUS
  Filled 2018-06-28: qty 17

## 2018-06-28 MED ORDER — SODIUM CHLORIDE 0.9 % IV SOLN
Freq: Once | INTRAVENOUS | Status: AC
  Administered 2018-06-28: 08:00:00 via INTRAVENOUS
  Filled 2018-06-28: qty 250

## 2018-06-28 NOTE — Patient Instructions (Signed)

## 2018-06-28 NOTE — Progress Notes (Signed)
Pt declined to stay for 30 min post Feraheme observation. Vitals stable and pt ambulated out of clinic without incident. Work note provided.

## 2018-07-02 ENCOUNTER — Telehealth: Payer: Self-pay | Admitting: Hematology and Oncology

## 2018-07-02 NOTE — Telephone Encounter (Signed)
Returned pts call to r/s appts

## 2018-07-04 ENCOUNTER — Encounter (HOSPITAL_BASED_OUTPATIENT_CLINIC_OR_DEPARTMENT_OTHER): Payer: BC Managed Care – PPO | Attending: Internal Medicine

## 2018-07-04 ENCOUNTER — Encounter: Payer: Self-pay | Admitting: Vascular Surgery

## 2018-07-04 DIAGNOSIS — L97322 Non-pressure chronic ulcer of left ankle with fat layer exposed: Secondary | ICD-10-CM | POA: Insufficient documentation

## 2018-07-04 DIAGNOSIS — I87332 Chronic venous hypertension (idiopathic) with ulcer and inflammation of left lower extremity: Secondary | ICD-10-CM | POA: Diagnosis not present

## 2018-07-04 DIAGNOSIS — Z86718 Personal history of other venous thrombosis and embolism: Secondary | ICD-10-CM | POA: Insufficient documentation

## 2018-07-05 ENCOUNTER — Ambulatory Visit: Payer: BC Managed Care – PPO

## 2018-07-23 ENCOUNTER — Ambulatory Visit (INDEPENDENT_AMBULATORY_CARE_PROVIDER_SITE_OTHER): Payer: BC Managed Care – PPO | Admitting: Obstetrics & Gynecology

## 2018-07-23 ENCOUNTER — Encounter: Payer: Self-pay | Admitting: Obstetrics & Gynecology

## 2018-07-23 VITALS — BP 139/87 | HR 72 | Wt 194.0 lb

## 2018-07-23 DIAGNOSIS — D259 Leiomyoma of uterus, unspecified: Secondary | ICD-10-CM

## 2018-07-23 DIAGNOSIS — Z124 Encounter for screening for malignant neoplasm of cervix: Secondary | ICD-10-CM

## 2018-07-23 DIAGNOSIS — Z1231 Encounter for screening mammogram for malignant neoplasm of breast: Secondary | ICD-10-CM

## 2018-07-23 DIAGNOSIS — Z1151 Encounter for screening for human papillomavirus (HPV): Secondary | ICD-10-CM

## 2018-07-23 DIAGNOSIS — Z01419 Encounter for gynecological examination (general) (routine) without abnormal findings: Secondary | ICD-10-CM

## 2018-07-23 MED ORDER — NORETHINDRONE ACETATE 5 MG PO TABS: 10.0000 mg | ORAL_TABLET | Freq: Every day | ORAL | 5 refills | Status: DC

## 2018-07-23 MED ORDER — NORETHINDRONE ACETATE 5 MG PO TABS: 10.0000 mg | ORAL_TABLET | Freq: Every day | ORAL | 1 refills | Status: DC

## 2018-07-23 MED ORDER — LEUPROLIDE ACETATE (3 MONTH) 11.25 MG IM KIT: 11.2500 mg | PACK | INTRAMUSCULAR | 4 refills | Status: DC

## 2018-07-23 NOTE — Progress Notes (Signed)
GYNECOLOGY ANNUAL PREVENTATIVE CARE ENCOUNTER NOTE  Subjective:   [name] is a 42 y.o. [id number] female here for a routine annual gynecologic exam.  Has complicated medical history; recurrent DVT/PE on chronic anticoagulation; large fibroid uterus with AUB;  TOA requiring IR drainage.  Patient had been scheduled for UFE prior to admission for [facility] and recurrent VTE.  Was ordered for Lupron, had one dose in 12/2017, but no subsequent doses as her bleeding was less. However lately, she reports few erratic episodes of light-moderate bleeding despite Norethindrone use, 10 mg daily. Wears pads every day just in case.   Still reports intermittent pelvic pressure and pain secondary to fibroid, but this is not too bothersome. Denies abnormal vaginal discharge, pelvic pain, problems with intercourse or other gynecologic concerns.    Gynecologic History Patient's last menstrual period was 06/28/2018 (lmp unknown). Contraception: none Last Pap: 07/18/2107. Results were: normal with negative HPV Last mammogram: 09/17/2017. Results were: normal  Obstetric History OB History  Gravida Para Term Preterm AB Living  1 1 0 0 0 1  SAB TAB Ectopic Multiple Live Births  0 0 0 0 1    # Outcome Date GA Lbr Len/2nd Weight Sex Delivery Anes PTL Lv  1 Para 03/28/07    F CS-Classical   LIV    Past Medical History:  Diagnosis Date  . Anemia 09/2017   REQUIRING A TRANSFUSION  . Antiphospholipid syndrome ([place])   . Breast cyst    BILATERAL  . DVT (deep venous thrombosis) ([place])   . Dysfunctional uterine bleeding   . Fibroids   . Obesity     Past Surgical History:  Procedure Laterality Date  . CESAREAN SECTION    . DILITATION & CURRETTAGE/HYSTROSCOPY WITH HYDROTHERMAL ABLATION N/A 10/08/2014   Procedure: DILATATION & CURETTAGE/HYSTEROSCOPY WITH HYDROTHERMAL ABLATION;  Surgeon: Osborne Oman, MD;  Location: [facility];  Service: Gynecology;  Laterality: N/A;  . IR RADIOLOGIST EVAL & MGMT  10/10/2017   . IR RADIOLOGIST EVAL & MGMT  11/28/2017  . IR RADIOLOGIST EVAL & MGMT  12/12/2017  . IR SINUS/FIST TUBE CHK-NON GI  12/26/2017  . MYOMECTOMY      Current Outpatient Medications on File Prior to Visit  Medication Sig Dispense Refill  . XARELTO 20 MG TABS tablet TAKE 1 TABLET (20 MG TOTAL) BY MOUTH DAILY WITH SUPPER. 30 tablet 11   Current Facility-Administered Medications on File Prior to Visit  Medication Dose Route Frequency Provider Last Rate Last Dose  . oxyCODONE-acetaminophen (PERCOCET/ROXICET) 5-325 MG per tablet 2 tablet  2 tablet Oral Once Harle Stanford., PA-C        No Known Allergies  Social History:  reports that she has never smoked. She has never used smokeless tobacco. She reports that she drinks alcohol. She reports that she does not use drugs.  Family History  Problem Relation Age of Onset  . Diabetes Paternal Grandmother   . Heart disease Neg Hx   . Hypertension Neg Hx   . Cancer Neg Hx        breast or colon cancer    The following portions of the patient's history were reviewed and updated as appropriate: allergies, current medications, past family history, past medical history, past social history, past surgical history and problem list.  Review of Systems Pertinent items noted in HPI and remainder of comprehensive ROS otherwise negative.   Objective:  BP 139/87   Pulse 72   Wt 194 lb (88 kg)  LMP 06/28/2018 (LMP Unknown)   BMI 28.65 kg/m  CONSTITUTIONAL: Well-developed, well-nourished female in no acute distress.  HENT:  Normocephalic, atraumatic, External right and left ear normal. Oropharynx is clear and moist EYES: Conjunctivae and EOM are normal. Pupils are equal, round, and reactive to light. No scleral icterus.  NECK: Normal range of motion, supple, no masses.  Normal thyroid.  SKIN: Skin is warm and dry. No rash noted. Not diaphoretic. No erythema. No pallor. MUSCULOSKELETAL: Normal range of motion. No tenderness.  No cyanosis, clubbing, or  edema.  2+ distal pulses. NEUROLOGIC: Alert and oriented to person, place, and time. Normal reflexes, muscle tone coordination. No cranial nerve deficit noted. PSYCHIATRIC: Normal mood and affect. Normal behavior. Normal judgment and thought content. CARDIOVASCULAR: Normal heart rate noted, regular rhythm RESPIRATORY: Clear to auscultation bilaterally. Effort and breath sounds normal, no problems with respiration noted. BREASTS: Symmetric in size. No masses, skin changes, nipple drainage, or lymphadenopathy. ABDOMEN: Soft, normal bowel sounds, mild distention noted from enlarged uterus.  No tenderness, rebound or guarding.  PELVIC:  Normal appearing external genitalia; normal appearing vaginal mucosa and cervix.  No abnormal discharge noted.  Pap smear obtained. Enlarged uterine size , about 18 week size, no other palpable masses, no uterine or adnexal tenderness   Assessment and Plan:  1. Uterine leiomyoma, unspecified location Patient with erratic episodes of bleeding. Recommended restarting Lupron, will also continue Aygestin for addback therapy.  She agreed to this plan, will get the 3 month formulation (11.25 mg) and bring it in for injection here at [facility]. Aygestin refilled also. Ultrasound ordered to reevaluate fibroid size and for surveillance. She is not an ideal surgical candidate especially given her VTE history. If surgery is needed, will recommend transfer to a tertiary institution. - US PELVIC COMPLETE WITH TRANSVAGINAL; Future - leuprolide (LUPRON DEPOT, 63-MONTH,) 11.25 MG injection; Inject 11.25 mg into the muscle every 3 (three) months.  Dispense: 1 each; Refill: 4 - norethindrone (AYGESTIN) 5 MG tablet; Take 2 tablets (10 mg total) by mouth daily.  Dispense: 180 tablet; Refill: 5  2. Breast cancer screening by mammogram Mammogram scheduled - MM 3D SCREEN BREAST BILATERAL; Future  3. Well woman exam - Cytology - PAP( [place]) Will follow up results of pap smear and manage  accordingly. Routine preventative health maintenance measures emphasized. Please refer to After Visit Summary for other counseling recommendations.    Verita Schneiders, MD, [place] for [employer], [place]

## 2018-07-23 NOTE — Patient Instructions (Signed)
Preventive Care 40-64 Years, Female Preventive care refers to lifestyle choices and visits with your health care provider that can promote health and wellness. What does preventive care include?  A yearly physical exam. This is also called an annual well check.  Dental exams once or twice a year.  Routine eye exams. Ask your health care provider how often you should have your eyes checked.  Personal lifestyle choices, including: ? Daily care of your teeth and gums. ? Regular physical activity. ? Eating a healthy diet. ? Avoiding tobacco and drug use. ? Limiting alcohol use. ? Practicing safe sex. ? Taking low-dose aspirin daily starting at age 58. ? Taking vitamin and mineral supplements as recommended by your health care provider. What happens during an annual well check? The services and screenings done by your health care provider during your annual well check will depend on your age, overall health, lifestyle risk factors, and family history of disease. Counseling Your health care provider may ask you questions about your:  Alcohol use.  Tobacco use.  Drug use.  Emotional well-being.  Home and relationship well-being.  Sexual activity.  Eating habits.  Work and work Statistician.  Method of birth control.  Menstrual cycle.  Pregnancy history.  Screening You may have the following tests or measurements:  Height, weight, and BMI.  Blood pressure.  Lipid and cholesterol levels. These may be checked every 5 years, or more frequently if you are over 81 years old.  Skin check.  Lung cancer screening. You may have this screening every year starting at age 78 if you have a 30-pack-year history of smoking and currently smoke or have quit within the past 15 years.  Fecal occult blood test (FOBT) of the stool. You may have this test every year starting at age 65.  Flexible sigmoidoscopy or colonoscopy. You may have a sigmoidoscopy every 5 years or a colonoscopy  every 10 years starting at age 30.  Hepatitis C blood test.  Hepatitis B blood test.  Sexually transmitted disease (STD) testing.  Diabetes screening. This is done by checking your blood sugar (glucose) after you have not eaten for a while (fasting). You may have this done every 1-3 years.  Mammogram. This may be done every 1-2 years. Talk to your health care provider about when you should start having regular mammograms. This may depend on whether you have a family history of breast cancer.  BRCA-related cancer screening. This may be done if you have a family history of breast, ovarian, tubal, or peritoneal cancers.  Pelvic exam and Pap test. This may be done every 3 years starting at age 80. Starting at age 36, this may be done every 5 years if you have a Pap test in combination with an HPV test.  Bone density scan. This is done to screen for osteoporosis. You may have this scan if you are at high risk for osteoporosis.  Discuss your test results, treatment options, and if necessary, the need for more tests with your health care provider. Vaccines Your health care provider may recommend certain vaccines, such as:  Influenza vaccine. This is recommended every year.  Tetanus, diphtheria, and acellular pertussis (Tdap, Td) vaccine. You may need a Td booster every 10 years.  Varicella vaccine. You may need this if you have not been vaccinated.  Zoster vaccine. You may need this after age 5.  Measles, mumps, and rubella (MMR) vaccine. You may need at least one dose of MMR if you were born in  1957 or later. You may also need a second dose.  Pneumococcal 13-valent conjugate (PCV13) vaccine. You may need this if you have certain conditions and were not previously vaccinated.  Pneumococcal polysaccharide (PPSV23) vaccine. You may need one or two doses if you smoke cigarettes or if you have certain conditions.  Meningococcal vaccine. You may need this if you have certain  conditions.  Hepatitis A vaccine. You may need this if you have certain conditions or if you travel or work in places where you may be exposed to hepatitis A.  Hepatitis B vaccine. You may need this if you have certain conditions or if you travel or work in places where you may be exposed to hepatitis B.  Haemophilus influenzae type b (Hib) vaccine. You may need this if you have certain conditions.  Talk to your health care provider about which screenings and vaccines you need and how often you need them. This information is not intended to replace advice given to you by your health care provider. Make sure you discuss any questions you have with your health care provider. Document Released: 09/10/2015 Document Revised: 05/03/2016 Document Reviewed: 06/15/2015 Elsevier Interactive Patient Education  2018 Elsevier Inc.  

## 2018-07-24 DIAGNOSIS — I87332 Chronic venous hypertension (idiopathic) with ulcer and inflammation of left lower extremity: Secondary | ICD-10-CM | POA: Diagnosis not present

## 2018-07-26 ENCOUNTER — Other Ambulatory Visit: Payer: Self-pay | Admitting: Hematology and Oncology

## 2018-07-26 ENCOUNTER — Inpatient Hospital Stay: Payer: BC Managed Care – PPO

## 2018-07-26 ENCOUNTER — Other Ambulatory Visit: Payer: Self-pay | Admitting: Hematology

## 2018-07-26 VITALS — BP 127/84 | HR 75 | Temp 98.0°F | Resp 18

## 2018-07-26 DIAGNOSIS — D500 Iron deficiency anemia secondary to blood loss (chronic): Secondary | ICD-10-CM | POA: Diagnosis not present

## 2018-07-26 DIAGNOSIS — D62 Acute posthemorrhagic anemia: Secondary | ICD-10-CM

## 2018-07-26 LAB — CYTOLOGY - PAP
Diagnosis: NEGATIVE
HPV (WINDOPATH): NOT DETECTED

## 2018-07-26 MED ORDER — SODIUM CHLORIDE 0.9 % IV SOLN
510.0000 mg | Freq: Once | INTRAVENOUS | Status: AC
  Administered 2018-07-26: 510 mg via INTRAVENOUS
  Filled 2018-07-26: qty 17

## 2018-07-26 MED ORDER — SODIUM CHLORIDE 0.9 % IV SOLN
Freq: Once | INTRAVENOUS | Status: AC
  Administered 2018-07-26: 09:00:00 via INTRAVENOUS
  Filled 2018-07-26: qty 250

## 2018-07-26 NOTE — Progress Notes (Signed)
Pt refused to stay full 30 min post IV feraheme (only stayed 15 minutes)  Pt discharged home and educated on s/s of reaction.

## 2018-07-26 NOTE — Patient Instructions (Signed)

## 2018-07-29 ENCOUNTER — Encounter: Payer: Self-pay | Admitting: Radiology

## 2018-08-16 ENCOUNTER — Encounter (HOSPITAL_BASED_OUTPATIENT_CLINIC_OR_DEPARTMENT_OTHER): Payer: BC Managed Care – PPO | Attending: Internal Medicine

## 2018-08-16 DIAGNOSIS — Z86718 Personal history of other venous thrombosis and embolism: Secondary | ICD-10-CM | POA: Diagnosis not present

## 2018-08-16 DIAGNOSIS — Z09 Encounter for follow-up examination after completed treatment for conditions other than malignant neoplasm: Secondary | ICD-10-CM | POA: Diagnosis present

## 2018-08-16 DIAGNOSIS — Z872 Personal history of diseases of the skin and subcutaneous tissue: Secondary | ICD-10-CM | POA: Diagnosis not present

## 2018-08-16 DIAGNOSIS — I87302 Chronic venous hypertension (idiopathic) without complications of left lower extremity: Secondary | ICD-10-CM | POA: Diagnosis not present

## 2018-08-27 ENCOUNTER — Ambulatory Visit (HOSPITAL_COMMUNITY)
Admission: RE | Admit: 2018-08-27 | Discharge: 2018-08-27 | Disposition: A | Discharge status: Home / Self Care | Payer: BC Managed Care – PPO | Source: Ambulatory Visit | Attending: Obstetrics & Gynecology | Admitting: Obstetrics & Gynecology

## 2018-08-27 DIAGNOSIS — D259 Leiomyoma of uterus, unspecified: Secondary | ICD-10-CM

## 2018-09-16 ENCOUNTER — Ambulatory Visit
Admission: RE | Admit: 2018-09-16 | Discharge: 2018-09-16 | Disposition: A | Discharge status: Home / Self Care | Payer: BC Managed Care – PPO | Source: Ambulatory Visit | Attending: Obstetrics & Gynecology | Admitting: Obstetrics & Gynecology

## 2018-09-16 DIAGNOSIS — Z1231 Encounter for screening mammogram for malignant neoplasm of breast: Secondary | ICD-10-CM

## 2018-09-21 ENCOUNTER — Other Ambulatory Visit: Payer: Self-pay | Admitting: Internal Medicine

## 2018-11-15 ENCOUNTER — Encounter: Payer: Self-pay | Admitting: Internal Medicine

## 2018-11-15 ENCOUNTER — Ambulatory Visit: Payer: BC Managed Care – PPO | Admitting: Internal Medicine

## 2018-11-15 ENCOUNTER — Other Ambulatory Visit: Payer: Self-pay

## 2018-11-15 VITALS — BP 110/70 | HR 95 | Temp 98.3°F | Ht 69.0 in | Wt 211.0 lb

## 2018-11-15 DIAGNOSIS — L989 Disorder of the skin and subcutaneous tissue, unspecified: Secondary | ICD-10-CM

## 2018-11-15 NOTE — Assessment & Plan Note (Signed)
This is unclear Could be venous stasis changes--but not classic Doesn't seem like fixed drug eruption, morphea, etc Clearly not cellulitis Discussed elevation, support hose Derm if doesn't improve

## 2018-11-15 NOTE — Progress Notes (Signed)
Subjective:    Patient ID: [name], female    DOB: 1976-05-14, 43 y.o.   MRN: [id number]  HPI Here due to red spot on her leg Goes back for a few weeks Tries to keep her leg elevated---support hose at times Flat and dark red Feels hard---but not tender Slight scaly  Current Outpatient Medications on File Prior to Visit  Medication Sig Dispense Refill  . leuprolide (LUPRON DEPOT, 52-MONTH,) 11.25 MG injection Inject 11.25 mg into the muscle every 3 (three) months. 1 each 4  . norethindrone (AYGESTIN) 5 MG tablet Take 2 tablets (10 mg total) by mouth daily. 180 tablet 5  . XARELTO 20 MG TABS tablet TAKE 1 TABLET (20 MG TOTAL) BY MOUTH DAILY WITH SUPPER. 90 tablet 3   Current Facility-Administered Medications on File Prior to Visit  Medication Dose Route Frequency Provider Last Rate Last Dose  . oxyCODONE-acetaminophen (PERCOCET/ROXICET) 5-325 MG per tablet 2 tablet  2 tablet Oral Once Harle Stanford., PA-C        No Known Allergies  Past Medical History:  Diagnosis Date  . Anemia 09/2017   REQUIRING A TRANSFUSION  . Antiphospholipid syndrome ([place])   . Breast cyst    BILATERAL  . DVT (deep venous thrombosis) ([place])   . Dysfunctional uterine bleeding   . Fibroids   . Obesity     Past Surgical History:  Procedure Laterality Date  . CESAREAN SECTION    . DILITATION & CURRETTAGE/HYSTROSCOPY WITH HYDROTHERMAL ABLATION N/A 10/08/2014   Procedure: DILATATION & CURETTAGE/HYSTEROSCOPY WITH HYDROTHERMAL ABLATION;  Surgeon: Osborne Oman, MD;  Location: [facility];  Service: Gynecology;  Laterality: N/A;  . IR RADIOLOGIST EVAL & MGMT  10/10/2017  . IR RADIOLOGIST EVAL & MGMT  11/28/2017  . IR RADIOLOGIST EVAL & MGMT  12/12/2017  . IR SINUS/FIST TUBE CHK-NON GI  12/26/2017  . MYOMECTOMY      Family History  Problem Relation Age of Onset  . Diabetes Paternal Grandmother   . Heart disease Neg Hx   . Hypertension Neg Hx   . Cancer Neg Hx        breast or colon cancer     Social History   Socioeconomic History  . Marital status: Married    Spouse name: Not on file  . Number of children: 1  . Years of education: Not on file  . Highest education level: Not on file  Occupational History  . 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 Financial resource strain: Not on file  . Food insecurity:    Worry: Not on file    Inability: Not on file  . Transportation needs:    Medical: Not on file    Non-medical: Not on file  Tobacco Use  . Smoking status: Never Smoker  . Smokeless tobacco: Never Used  Substance and Sexual Activity  . Alcohol use: Yes    Comment: rare  . Drug use: No  . Sexual activity: Yes    Partners: Male    Birth control/protection: None  Lifestyle  . Physical activity:    Days per week: Not on file    Minutes per session: Not on file  . Stress: Not on file  Relationships  . Social connections:    Talks on phone: Not on file    Gets together: Not on file    Attends religious service: Not on file    Active member of club or  organization: Not on file    Attends meetings of clubs or organizations: Not on file    Relationship status: Not on file  . Intimate partner violence:    Fear of current or ex partner: Not on file    Emotionally abused: Not on file    Physically abused: Not on file    Forced sexual activity: Not on file  Other Topics Concern  . Not on file  Social History Narrative  . Not on file   Review of Systems Bleeding has finally settled down No fever Has gained weight--but not really fluid     Objective:   Physical Exam  Musculoskeletal:     Comments: At most slight edema in calves  Skin:  6 cm circular purplish macule on left anterior calf (lower 1/3rd) No warmth At most slight tenderness No induration           Assessment & Plan:

## 2018-12-04 ENCOUNTER — Telehealth: Payer: Self-pay | Admitting: Hematology and Oncology

## 2018-12-04 NOTE — Telephone Encounter (Signed)
Called patient per 4/8 sch message - no answer and vmail full .

## 2018-12-10 ENCOUNTER — Inpatient Hospital Stay: Payer: BC Managed Care – PPO

## 2018-12-10 ENCOUNTER — Inpatient Hospital Stay: Payer: BC Managed Care – PPO | Admitting: Hematology and Oncology

## 2018-12-23 ENCOUNTER — Ambulatory Visit: Payer: BC Managed Care – PPO | Admitting: Hematology and Oncology

## 2018-12-23 ENCOUNTER — Other Ambulatory Visit: Payer: BC Managed Care – PPO

## 2019-02-07 ENCOUNTER — Telehealth: Payer: Self-pay | Admitting: Hematology and Oncology

## 2019-02-07 NOTE — Assessment & Plan Note (Signed)
Blood loss anemia with iron deficiency:2 units of blood given for hemoglobin of 6.8 in May 2019 Received IV iron treatment 04/26/2018 and 05/04/2018; November 2019  Lab review

## 2019-02-07 NOTE — Telephone Encounter (Signed)
I did tried to call patient and the voicemail was full. I did not change visit type because patient would not be aware.

## 2019-02-13 NOTE — Progress Notes (Signed)
Patient Care Team: Venia Carbon, MD as PCP - General  DIAGNOSIS:    ICD-10-CM   1. 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 She presents to the clinic today to review her labs.   REVIEW OF SYSTEMS:   Constitutional: Denies fevers, chills or abnormal weight loss Eyes: Denies blurriness of vision Ears, nose, mouth, throat, and face: Denies mucositis or sore throat Respiratory: Denies cough, dyspnea or wheezes Cardiovascular: Denies palpitation, chest discomfort Gastrointestinal: Denies nausea, heartburn or change in bowel habits Skin: Denies abnormal skin rashes Lymphatics: Denies new lymphadenopathy or easy bruising Neurological: Denies numbness, tingling or new weaknesses Behavioral/Psych: Mood is stable, no new changes  Extremities: No lower extremity edema Breast: denies any pain or lumps or nodules in either breasts All other systems were reviewed with the patient and are negative.  I have reviewed the past medical history, past surgical history, social history and family history with the patient and they are unchanged from previous note.  ALLERGIES:  has No Known Allergies.  MEDICATIONS:  Current Outpatient Medications  Medication Sig Dispense Refill  . leuprolide (LUPRON DEPOT, 46-MONTH,) 11.25 MG injection Inject 11.25 mg into the muscle every 3 (three) months. 1 each 4  . norethindrone (AYGESTIN) 5 MG tablet Take 2 tablets (10 mg total) by mouth daily. 180 tablet 5  . XARELTO 20 MG TABS tablet TAKE 1 TABLET (20 MG TOTAL) BY MOUTH DAILY WITH SUPPER. 90 tablet 3   No current facility-administered medications for this visit.    Facility-Administered  Medications Ordered in Other Visits  Medication Dose Route Frequency Provider Last Rate Last Dose  . oxyCODONE-acetaminophen (PERCOCET/ROXICET) 5-325 MG per tablet 2 tablet  2 tablet Oral Once Harle Stanford., PA-C        PHYSICAL EXAMINATION: ECOG PERFORMANCE STATUS: 1 - Symptomatic but completely ambulatory  Vitals:   02/14/19 1129  BP: 139/83  Pulse: 94  Resp: 16  Temp: 98.5 F (36.9 C)  SpO2: 99%   Filed Weights   02/14/19 1129  Weight: 231 lb 1.6 oz (104.8 kg)    GENERAL: alert, no distress and comfortable SKIN: skin color, texture, turgor are normal, no rashes or significant lesions EYES: normal, Conjunctiva are pink and non-injected, sclera clear OROPHARYNX: no exudate, no erythema and lips, buccal mucosa, and tongue normal  NECK: supple, thyroid normal size, non-tender, without nodularity LYMPH: no palpable lymphadenopathy in the cervical, axillary or inguinal LUNGS: clear to auscultation and percussion with normal breathing effort HEART: regular rate & rhythm and no murmurs and no lower extremity edema ABDOMEN: abdomen soft, non-tender and normal bowel sounds MUSCULOSKELETAL: no cyanosis of digits and no clubbing  NEURO: alert & oriented x 3 with fluent speech, no focal motor/sensory deficits EXTREMITIES: No lower extremity edema  LABORATORY DATA:  I have reviewed the data as listed CMP Latest Ref Rng & Units 12/27/2017 11/22/2017 11/21/2017  Glucose 70 - 140 mg/dL 88 99 101(H)  BUN 7 - 26 mg/dL 4(L) 6 6  Creatinine 0.60 - 1.10 mg/dL 0.79 0.66 0.62  Sodium 136 - 145 mmol/L 139 144 140  Potassium 3.5 - 5.1 mmol/L 3.4(L) 3.6 3.1(L)  Chloride 98 - 109 mmol/L 104 113(H)  108  CO2 22 - 29 mmol/L 27 23 24   Calcium 8.4 - 10.4 mg/dL 9.2 8.3(L) 8.2(L)  Total Protein 6.4 - 8.3 g/dL 7.9 - -  Total Bilirubin 0.2 - 1.2 mg/dL 0.3 - -  Alkaline Phos 40 - 150 U/L 59 - -  AST 5 - 34 U/L 14 - -  ALT 0 - 55 U/L 11 - -    Lab Results  Component Value Date   WBC 3.6 (L)  02/14/2019   HGB 14.2 02/14/2019   HCT 44.5 02/14/2019   MCV 83.6 02/14/2019   PLT 256 02/14/2019   NEUTROABS 1.7 02/14/2019    ASSESSMENT & PLAN:  Iron deficiency anemia due to chronic blood loss History of recurrent DVTs and anticoagulation  Blood loss anemia with iron deficiency:2 units of blood given for hemoglobin of 6.8 in May 2019 Received IV iron treatment 04/26/2018 and 05/04/2018; November 2019  Lab review: Hemoglobin 14.2, MCV 83.6, WBC 3.6, platelet count 256 Blood work looks excellent and there is no need for IV iron at this time. Return to clinic in 6 months with labs done and follow-up after  No orders of the defined types were placed in this encounter.  The patient has a good understanding of the overall plan. she agrees with it. she will call with any problems that may develop before the next visit here.  Nicholas Lose, MD 02/14/2019  Julious Oka Dorshimer am acting as scribe for Dr. Nicholas Lose.  I have reviewed the above documentation for accuracy and completeness, and I agree with the above.

## 2019-02-14 ENCOUNTER — Telehealth: Payer: Self-pay | Admitting: Hematology and Oncology

## 2019-02-14 ENCOUNTER — Inpatient Hospital Stay: Payer: BC Managed Care – PPO | Attending: Hematology and Oncology | Admitting: Hematology and Oncology

## 2019-02-14 ENCOUNTER — Inpatient Hospital Stay: Payer: BC Managed Care – PPO

## 2019-02-14 ENCOUNTER — Other Ambulatory Visit: Payer: BC Managed Care – PPO

## 2019-02-14 ENCOUNTER — Other Ambulatory Visit: Payer: Self-pay | Admitting: Hematology and Oncology

## 2019-02-14 ENCOUNTER — Other Ambulatory Visit: Payer: Self-pay

## 2019-02-14 DIAGNOSIS — Z86718 Personal history of other venous thrombosis and embolism: Secondary | ICD-10-CM

## 2019-02-14 DIAGNOSIS — D508 Other iron deficiency anemias: Secondary | ICD-10-CM | POA: Diagnosis present

## 2019-02-14 DIAGNOSIS — Z793 Long term (current) use of hormonal contraceptives: Secondary | ICD-10-CM | POA: Insufficient documentation

## 2019-02-14 DIAGNOSIS — Z7901 Long term (current) use of anticoagulants: Secondary | ICD-10-CM | POA: Diagnosis not present

## 2019-02-14 DIAGNOSIS — D500 Iron deficiency anemia secondary to blood loss (chronic): Secondary | ICD-10-CM

## 2019-02-14 LAB — CBC WITH DIFFERENTIAL (CANCER CENTER ONLY)
Abs Immature Granulocytes: 0.01 10*3/uL (ref 0.00–0.07)
Basophils Absolute: 0 10*3/uL (ref 0.0–0.1)
Basophils Relative: 1 %
Eosinophils Absolute: 0.1 10*3/uL (ref 0.0–0.5)
Eosinophils Relative: 4 %
HCT: 44.5 % (ref 36.0–46.0)
Hemoglobin: 14.2 g/dL (ref 12.0–15.0)
Immature Granulocytes: 0 %
Lymphocytes Relative: 40 %
Lymphs Abs: 1.5 10*3/uL (ref 0.7–4.0)
MCH: 26.7 pg (ref 26.0–34.0)
MCHC: 31.9 g/dL (ref 30.0–36.0)
MCV: 83.6 fL (ref 80.0–100.0)
Monocytes Absolute: 0.4 10*3/uL (ref 0.1–1.0)
Monocytes Relative: 10 %
Neutro Abs: 1.7 10*3/uL (ref 1.7–7.7)
Neutrophils Relative %: 45 %
Platelet Count: 256 10*3/uL (ref 150–400)
RBC: 5.32 MIL/uL — ABNORMAL HIGH (ref 3.87–5.11)
RDW: 13.2 % (ref 11.5–15.5)
WBC Count: 3.6 10*3/uL — ABNORMAL LOW (ref 4.0–10.5)
nRBC: 0 % (ref 0.0–0.2)

## 2019-02-14 LAB — IRON AND TIBC
Iron: 82 ug/dL (ref 41–142)
Saturation Ratios: 15 % — ABNORMAL LOW (ref 21–57)
TIBC: 533 ug/dL — ABNORMAL HIGH (ref 236–444)
UIBC: 451 ug/dL — ABNORMAL HIGH (ref 120–384)

## 2019-02-14 LAB — FERRITIN: Ferritin: 8 ng/mL — ABNORMAL LOW (ref 11–307)

## 2019-02-14 NOTE — Telephone Encounter (Signed)
Gave avs and calendar ° °

## 2019-02-14 NOTE — Telephone Encounter (Signed)
Added infusion appointments 6/26 and 7/2. Confirmed with patient.

## 2019-02-14 NOTE — Telephone Encounter (Signed)
Ferritin is 8 She will be set up for IV Iron infusions

## 2019-02-21 ENCOUNTER — Inpatient Hospital Stay: Payer: BC Managed Care – PPO

## 2019-02-21 ENCOUNTER — Other Ambulatory Visit: Payer: Self-pay

## 2019-02-21 VITALS — BP 134/93 | HR 83 | Temp 98.5°F | Resp 16

## 2019-02-21 DIAGNOSIS — D508 Other iron deficiency anemias: Secondary | ICD-10-CM | POA: Diagnosis not present

## 2019-02-21 DIAGNOSIS — D500 Iron deficiency anemia secondary to blood loss (chronic): Secondary | ICD-10-CM

## 2019-02-21 DIAGNOSIS — D62 Acute posthemorrhagic anemia: Secondary | ICD-10-CM

## 2019-02-21 MED ORDER — SODIUM CHLORIDE 0.9 % IV SOLN
510.0000 mg | Freq: Once | INTRAVENOUS | Status: AC
  Administered 2019-02-21: 510 mg via INTRAVENOUS
  Filled 2019-02-21: qty 510

## 2019-02-21 MED ORDER — SODIUM CHLORIDE 0.9 % IV SOLN
Freq: Once | INTRAVENOUS | Status: AC
  Administered 2019-02-21: 09:00:00 via INTRAVENOUS
  Filled 2019-02-21: qty 250

## 2019-02-21 NOTE — Patient Instructions (Signed)

## 2019-02-27 ENCOUNTER — Inpatient Hospital Stay: Payer: BC Managed Care – PPO | Attending: Hematology and Oncology

## 2019-02-27 ENCOUNTER — Other Ambulatory Visit: Payer: Self-pay

## 2019-02-27 VITALS — BP 135/85 | HR 97 | Temp 98.6°F | Resp 18

## 2019-02-27 DIAGNOSIS — Z86718 Personal history of other venous thrombosis and embolism: Secondary | ICD-10-CM | POA: Diagnosis not present

## 2019-02-27 DIAGNOSIS — Z7901 Long term (current) use of anticoagulants: Secondary | ICD-10-CM | POA: Diagnosis not present

## 2019-02-27 DIAGNOSIS — D500 Iron deficiency anemia secondary to blood loss (chronic): Secondary | ICD-10-CM

## 2019-02-27 DIAGNOSIS — D62 Acute posthemorrhagic anemia: Secondary | ICD-10-CM

## 2019-02-27 DIAGNOSIS — D508 Other iron deficiency anemias: Secondary | ICD-10-CM | POA: Diagnosis not present

## 2019-02-27 DIAGNOSIS — Z793 Long term (current) use of hormonal contraceptives: Secondary | ICD-10-CM | POA: Insufficient documentation

## 2019-02-27 MED ORDER — SODIUM CHLORIDE 0.9 % IV SOLN
510.0000 mg | Freq: Once | INTRAVENOUS | Status: AC
  Administered 2019-02-27: 510 mg via INTRAVENOUS
  Filled 2019-02-27: qty 510

## 2019-02-27 MED ORDER — SODIUM CHLORIDE 0.9 % IV SOLN
Freq: Once | INTRAVENOUS | Status: AC
  Administered 2019-02-27: 16:00:00 via INTRAVENOUS
  Filled 2019-02-27: qty 250

## 2019-02-27 NOTE — Patient Instructions (Signed)

## 2019-03-19 ENCOUNTER — Telehealth: Payer: Self-pay

## 2019-03-19 NOTE — Telephone Encounter (Signed)
Re-prescribed Lupron for her please.  Thank you!   Verita Schneiders, MD

## 2019-03-19 NOTE — Telephone Encounter (Signed)
Patient saw you in Nov 2019 for annual and to discuss lupron injection. Patient last lupron was 12/2017. She was supposed to come back in August for another but did not. She is currently on norethindine however her periods have been getting heavier. Do you want to see her first? If so do you want me to go ahead and try to get an approval for lurpon. I remember it taking a long time last year to get approved.

## 2019-03-20 ENCOUNTER — Other Ambulatory Visit: Payer: Self-pay

## 2019-03-20 DIAGNOSIS — D259 Leiomyoma of uterus, unspecified: Secondary | ICD-10-CM

## 2019-03-20 MED ORDER — LUPRON DEPOT (3-MONTH) 11.25 MG IM KIT: 11.2500 mg | PACK | INTRAMUSCULAR | 4 refills | Status: DC

## 2019-03-20 NOTE — Telephone Encounter (Signed)
Ok to refill lupron per [employer]

## 2019-03-31 ENCOUNTER — Encounter: Payer: Self-pay | Admitting: *Deleted

## 2019-04-03 ENCOUNTER — Encounter: Payer: Self-pay | Admitting: Hematology and Oncology

## 2019-04-22 NOTE — Telephone Encounter (Signed)
Form printed and placed in Dr Alla German Inbox on his desk

## 2019-04-23 NOTE — Telephone Encounter (Signed)
Form done See if you can scan it into her MyChart

## 2019-05-29 ENCOUNTER — Other Ambulatory Visit: Payer: Self-pay

## 2019-05-29 ENCOUNTER — Ambulatory Visit (INDEPENDENT_AMBULATORY_CARE_PROVIDER_SITE_OTHER): Payer: BC Managed Care – PPO

## 2019-05-29 VITALS — BP 134/87 | HR 98

## 2019-05-29 DIAGNOSIS — D219 Benign neoplasm of connective and other soft tissue, unspecified: Secondary | ICD-10-CM | POA: Diagnosis not present

## 2019-05-29 MED ORDER — LEUPROLIDE ACETATE (3 MONTH) 11.25 MG IM KIT
11.2500 mg | PACK | Freq: Once | INTRAMUSCULAR | Status: AC
  Administered 2019-05-29: 11.25 mg via INTRAMUSCULAR

## 2019-05-29 NOTE — Progress Notes (Signed)
Patient presented to office for her Lupron injection Given by D.Phillip Heal in Left Gluteal 11.38m Patient tolerated well and will follow up in three month for her next Lupron injection.

## 2019-05-29 NOTE — Progress Notes (Signed)
Patient seen and assessed by nursing staff during this encounter. I have reviewed the chart and agree with the documentation and plan.  Verita Schneiders, MD 05/29/2019 2:20 PM

## 2019-06-19 ENCOUNTER — Telehealth: Payer: Self-pay

## 2019-06-19 ENCOUNTER — Other Ambulatory Visit: Payer: Self-pay

## 2019-06-19 DIAGNOSIS — D259 Leiomyoma of uterus, unspecified: Secondary | ICD-10-CM

## 2019-06-19 MED ORDER — NORETHINDRONE ACETATE 5 MG PO TABS: ORAL_TABLET | ORAL | 5 refills | Status: DC

## 2019-06-19 NOTE — Telephone Encounter (Signed)
Spoke with patient she will go ahead and increase dose to 15 mg once per day and then go back to  10 mg.

## 2019-06-19 NOTE — Telephone Encounter (Signed)
Received call from patient she is concerned about bleeding again. Lupron was given on 05/29/2019 she started bleeding about one week after getting the injection. She is concerned about this and wanted to know what else can be done at this time. Would like to bring her in?

## 2019-06-19 NOTE — Telephone Encounter (Signed)
She can always come in for evaluation. However, just wanted to ensure she is still taking the Norethindrone 10 mg daily for add-back therapy as this can help with the bleeding.  If bleeding is occurring and she is already taking this, we can temporarily increase dosage to 15 mg a day until bleeding stops than go back to 10 mg daily for maintenance. She can also come in and we can have a discussion about this. Breakthrough bleeding can occur with Lupron, but it is rare while on Norethindrone too.   Verita Schneiders, MD

## 2019-06-24 ENCOUNTER — Ambulatory Visit: Payer: BC Managed Care – PPO | Admitting: Obstetrics & Gynecology

## 2019-07-03 ENCOUNTER — Encounter: Payer: Self-pay | Admitting: *Deleted

## 2019-07-08 ENCOUNTER — Ambulatory Visit (INDEPENDENT_AMBULATORY_CARE_PROVIDER_SITE_OTHER): Payer: BC Managed Care – PPO | Admitting: Obstetrics & Gynecology

## 2019-07-08 ENCOUNTER — Encounter: Payer: Self-pay | Admitting: Obstetrics & Gynecology

## 2019-07-08 ENCOUNTER — Other Ambulatory Visit: Payer: Self-pay

## 2019-07-08 VITALS — BP 126/88 | HR 98 | Ht 69.0 in | Wt 237.0 lb

## 2019-07-08 DIAGNOSIS — D259 Leiomyoma of uterus, unspecified: Secondary | ICD-10-CM | POA: Diagnosis not present

## 2019-07-08 DIAGNOSIS — N939 Abnormal uterine and vaginal bleeding, unspecified: Secondary | ICD-10-CM

## 2019-07-08 DIAGNOSIS — I82409 Acute embolism and thrombosis of unspecified deep veins of unspecified lower extremity: Secondary | ICD-10-CM | POA: Diagnosis not present

## 2019-07-08 DIAGNOSIS — Z7901 Long term (current) use of anticoagulants: Secondary | ICD-10-CM

## 2019-07-08 NOTE — Progress Notes (Signed)
GYNECOLOGY OFFICE VISIT NOTE  History:   [name] is a 43 y.o. G1P0001 here today for follow up visit.  Complicated history of fibroids and associated AUB, currently managed on Lupron and Aygestin for addback.  Was having insurance issues with Lupron, but now resolves, just needed prior authorization. No bleeding since Aygestin dosage was bumped up to 15 mg daily. Wants to discuss other options, does not want hysterectomy due to surgical risks. Has history of recurrent VTE on prolonged anticoagulation.  She denies any current abnormal vaginal discharge, bleeding, pelvic pain or other concerns.    Past Medical History:  Diagnosis Date  . Anemia 09/2017   REQUIRING A TRANSFUSION  . Antiphospholipid syndrome ([place])   . Breast cyst    BILATERAL  . DVT (deep venous thrombosis) ([place])   . Dysfunctional uterine bleeding   . Fibroids   . Obesity     Past Surgical History:  Procedure Laterality Date  . CESAREAN SECTION    . DILITATION & CURRETTAGE/HYSTROSCOPY WITH HYDROTHERMAL ABLATION N/A 10/08/2014   Procedure: DILATATION & CURETTAGE/HYSTEROSCOPY WITH HYDROTHERMAL ABLATION;  Surgeon: Osborne Oman, MD;  Location: [facility];  Service: Gynecology;  Laterality: N/A;  . IR RADIOLOGIST EVAL & MGMT  10/10/2017  . IR RADIOLOGIST EVAL & MGMT  11/28/2017  . IR RADIOLOGIST EVAL & MGMT  12/12/2017  . IR SINUS/FIST TUBE CHK-NON GI  12/26/2017  . MYOMECTOMY      The following portions of the patient's history were reviewed and updated as appropriate: allergies, current medications, past family history, past medical history, past social history, past surgical history and problem list.   Health Maintenance:  Normal pap and negative HRHPV on 07/23/2018.  Normal mammogram on 09/16/2018.   Review of Systems:  Pertinent items noted in HPI and remainder of comprehensive ROS otherwise negative.  Physical Exam:  BP 126/88   Pulse 98   Ht 5' 9"  (1.753 m)   Wt 237 lb (107.5 kg)   BMI 35.00 kg/m   CONSTITUTIONAL: Well-developed, well-nourished female in no acute distress.  HEENT:  Normocephalic, atraumatic. External right and left ear normal. No scleral icterus.  NECK: Normal range of motion, supple, no masses noted on observation SKIN: No rash noted. Not diaphoretic. No erythema. No pallor. MUSCULOSKELETAL: Normal range of motion. No edema noted. NEUROLOGIC: Alert and oriented to person, place, and time. Normal muscle tone coordination. No cranial nerve deficit noted. PSYCHIATRIC: Normal mood and affect. Normal behavior. Normal judgment and thought content. CARDIOVASCULAR: Normal heart rate noted RESPIRATORY: Effort and breath sounds normal, no problems with respiration noted ABDOMEN: Deferred   PELVIC: Deferred      Assessment and Plan:    1. Uterine leiomyoma, unspecified location 2. Abnormal uterine bleeding (AUB) 3. Recurrent deep vein thrombosis (DVT) ([facility]) 4. Anticoagulant long-term use Discussed continuing Lupron and Aygestin for now, but will re-refer to IR for evaluation for uterine artery/fibroid embolization. Not a good surgical candidate given her VTE risk and anticoagulated status, also had TOA last year so worried about pelvic adhesions.  This was discussed in detail with patient. All questions were answered. Will get follow up ultrasound to compare to the one in 07/2018, hopefully the Lupron would have helped in fibroid size reduction. Will follow up IR recommendations. If AUB continues, and is not controlled on Lupron +Aygestin and IR is unable to do procedure, hysterectomy may be considered but will refer to tertiary institution for any surgical procedure. - Ambulatory referral to Interventional Radiology - [place]  GYN Pelvis Complete with Transvaginal; Future - Ambulatory referral to Interventional Radiology Routine preventative health maintenance measures emphasized. Please refer to After Visit Summary for other counseling recommendations.   Return for any gynecologic  concerns.    Total face-to-face time with patient: 25 minutes.  Over 50% of encounter was spent on counseling and coordination of care.   Verita Schneiders, MD, [place] for [employer], [place]

## 2019-07-08 NOTE — Patient Instructions (Signed)
Uterine Artery Embolization for Fibroids  Uterine artery embolization is a procedure to shrink uterine fibroids. Uterine fibroids are masses of tissue (tumors) that can develop in the womb (uterus). They are also called leiomyomas. This type of tumor is not cancerous (benign) and does not spread to other parts of the body outside of the pelvic area. The pelvic area is the part of the body between the hip bones. You can have one or many fibroids. Fibroids can vary in size, shape, weight, and where they grow in the uterus. Some can become quite large. In this procedure, a thin plastic tube (catheter) is used to inject a chemical that blocks off the blood supply to the fibroid, which causes the fibroid to shrink. Tell a health care provider about:  Any allergies you have.  All medicines you are taking, including vitamins, herbs, eye drops, creams, and over-the-counter medicines.  Any problems you or family members have had with anesthetic medicines.  Any blood disorders you have.  Any surgeries you have had.  Any medical conditions you have.  Whether you are pregnant or may be pregnant. What are the risks? Generally, this is a safe procedure. However, problems may occur, including:  Bleeding.  Allergic reactions to medicines or dyes.  Damage to other structures or organs.  Infection, including blood infection (septicemia).  Injury to the uterus from decreased blood supply.  Lack of menstrual periods (amenorrhea).  Death of tissue cells (necrosis) around your bladder or vulva.  Development of a hole between organs or from an organ to the surface of your skin (fistula).  Blood clot in the legs (deep vein thrombosis) or lung (pulmonary embolus).  Nausea and vomiting. What happens before the procedure? Staying hydrated Follow instructions from your health care provider about hydration, which may include:  Up to 2 hours before the procedure - you may continue to drink clear  liquids, such as water, clear fruit juice, black coffee, and plain tea. Eating and drinking restrictions Follow instructions from your health care provider about eating and drinking, which may include:  8 hours before the procedure - stop eating heavy meals or foods such as meat, fried foods, or fatty foods.  6 hours before the procedure - stop eating light meals or foods, such as toast or cereal.  6 hours before the procedure - stop drinking milk or drinks that contain milk.  2 hours before the procedure - stop drinking clear liquids. Medicines  Ask your health care provider about: ? Changing or stopping your regular medicines. This is especially important if you are taking diabetes medicines or blood thinners. ? Taking over-the-counter medicines, vitamins, herbs, and supplements. ? Taking medicines such as aspirin and ibuprofen. These medicines can thin your blood. Do not take these medicines unless your health care provider tells you to take them.  You may be given antibiotic medicine to help prevent infection.  You may be given medicine to prevent nausea and vomiting (antiemetic). General instructions  Ask your health care provider how your surgical site will be marked or identified.  You may be asked to shower with a germ-killing soap.  Plan to have someone take you home from the hospital or clinic.  If you will be going home right after the procedure, plan to have someone with you for 24 hours.  You will be asked to empty your bladder. What happens during the procedure?  To lower your risk of infection: ? Your health care team will wash or sanitize their hands. ?  Hair may be removed from the surgical area. ? Your skin will be washed with soap.  An IV will be inserted into one of your veins.  You will be given one or more of the following: ? A medicine to help you relax (sedative). ? A medicine to numb the area (local anesthetic).  A small cut (incision) will be made  in your groin.  A catheter will be inserted into the main artery of your leg. The catheter will be guided through the artery to your uterus.  A series of images will be taken while dye is injected through the catheter in your groin. X-rays are taken at the same time. This is done to provide a road map of the blood supply to your uterus and fibroids.  Tiny plastic spheres, about the size of sand grains, will be injected through the catheter. Metal coils may be used to help block the artery. The particles will lodge in tiny branches of the uterine artery that supplies blood to the fibroids.  The procedure will be repeated on the artery that supplies the other side of the uterus.  The catheter will be removed and pressure will be applied to stop the bleeding.  A dressing will be placed over the incision. The procedure may vary among health care providers and hospitals. What happens after the procedure?  Your blood pressure, heart rate, breathing rate, and blood oxygen level will be monitored until the medicines you were given have worn off.  You will be given pain medicine as needed.  You may be given medicine for nausea and vomiting as needed.  Do not drive for 24 hours if you were given a sedative. Summary  Uterine artery embolization is a procedure to shrink uterine fibroids by blocking the blood supply to the fibroid.  You may be given a sedative and local anesthetic for the procedure.  A catheter will be inserted into the main artery of your leg. The catheter will be guided through the artery to your uterus.  After the procedure you will be given pain medicine and medicine for nausea as needed.  Do not drive for 24 hours if you were given a sedative. This information is not intended to replace advice given to you by your health care provider. Make sure you discuss any questions you have with your health care provider. Document Released: 10/30/2005 Document Revised: 07/27/2017  Document Reviewed: 11/16/2016 Elsevier Patient Education  2020 [employer].

## 2019-07-16 ENCOUNTER — Telehealth: Payer: Self-pay

## 2019-07-16 ENCOUNTER — Other Ambulatory Visit: Payer: Self-pay | Admitting: Obstetrics & Gynecology

## 2019-07-16 DIAGNOSIS — D250 Submucous leiomyoma of uterus: Secondary | ICD-10-CM

## 2019-07-16 NOTE — Telephone Encounter (Signed)
She said that her insurance will continue to pay for this at her last visit. They just needed prior authorization which was given to them; she is a very high risk surgical patient.  If they decline it, they can request peer-to-peer consult for authroization which I am happy to do.  She needs to continue on this until she gets embolization, hopefully.   Thank you!  Verita Schneiders, MD, [place] for [employer], [place]

## 2019-07-16 NOTE — Telephone Encounter (Signed)
Received call from patient regarding what the next step would be for her regarding her lupron. I have advised her [employer] will only pay for two doses of which she has taken. First injection was given last year 2019 and the other was this year 2020. I have followed up with referral that was placed in Epic for her to see IR to further discuss. The office advise me the order was place wrong in epic. This has been corrected and patient appt will be 08/11/2019. I have advised patient to keep her U/S appointment on 07/23/2019 so that we can see if her fibroids have decrease in size her last dose of lupron. Advised patient I will reach out to [facility] for follow up care and any other recommendations. Patient voice understanding.

## 2019-07-23 ENCOUNTER — Other Ambulatory Visit: Payer: Self-pay

## 2019-07-23 ENCOUNTER — Ambulatory Visit (HOSPITAL_COMMUNITY)
Admission: RE | Admit: 2019-07-23 | Discharge: 2019-07-23 | Disposition: A | Discharge status: Home / Self Care | Payer: BC Managed Care – PPO | Source: Ambulatory Visit | Attending: Obstetrics & Gynecology | Admitting: Obstetrics & Gynecology

## 2019-07-23 DIAGNOSIS — N939 Abnormal uterine and vaginal bleeding, unspecified: Secondary | ICD-10-CM | POA: Diagnosis present

## 2019-07-23 DIAGNOSIS — D259 Leiomyoma of uterus, unspecified: Secondary | ICD-10-CM | POA: Insufficient documentation

## 2019-08-01 NOTE — Telephone Encounter (Signed)
I have attempted to get a peer to peer review however her insurance company is so hard to get conncet with the right person. I will follow up after her appointment with IR.

## 2019-08-07 ENCOUNTER — Other Ambulatory Visit: Payer: Self-pay

## 2019-08-07 ENCOUNTER — Ambulatory Visit
Admission: RE | Admit: 2019-08-07 | Discharge: 2019-08-07 | Disposition: A | Discharge status: Home / Self Care | Payer: BC Managed Care – PPO | Source: Ambulatory Visit | Attending: Obstetrics & Gynecology | Admitting: Obstetrics & Gynecology

## 2019-08-07 DIAGNOSIS — D250 Submucous leiomyoma of uterus: Secondary | ICD-10-CM

## 2019-08-07 HISTORY — PX: IR RADIOLOGIST EVAL & MGMT: IMG5224

## 2019-08-07 NOTE — Consult Note (Signed)
Chief Complaint:  Symptomatic uterine fibroids, abnormal uterine bleeding, UFE consult (telehealth visit because of COVID-19 pandemic)   Referring Physician(s): Anyanwu,Ugonna A  History of Present Illness: [name] is a 43 y.o. female G1, P0.  Patient has a complicated history of large dominant uterine fibroid measuring up to 15 cm with associated abnormal menstrual bleeding.  She is currently managed by hormone therapy with Lupron and Aygestin.  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  She has antiphospholipid syndrome.  She also had a recent tubo-ovarian abscess requiring percutaneous drainage last year.  With the hormone therapy, the abnormal menstrual bleeding has significantly improved.  No current abnormal discharge, bleeding, pelvic pain or other bulk related symptoms.  Because of her other comorbidities, she is high risk for surgery and has been referred for consideration of uterine fibroid embolization.  Past Medical History:  Diagnosis Date   Anemia 09/2017   REQUIRING A TRANSFUSION   Antiphospholipid syndrome (HCC)    Breast cyst    BILATERAL   DVT (deep venous thrombosis) ([place])    Dysfunctional uterine bleeding    Fibroids    Obesity     Past Surgical History:  Procedure Laterality Date   CESAREAN SECTION     DILITATION & CURRETTAGE/HYSTROSCOPY WITH HYDROTHERMAL ABLATION N/A 10/08/2014   Procedure: DILATATION & CURETTAGE/HYSTEROSCOPY WITH HYDROTHERMAL ABLATION;  Surgeon: Osborne Oman, MD;  Location: [facility];  Service: Gynecology;  Laterality: N/A;   IR RADIOLOGIST EVAL & MGMT  10/10/2017   IR RADIOLOGIST EVAL & MGMT  11/28/2017   IR RADIOLOGIST EVAL & MGMT  12/12/2017   IR SINUS/FIST TUBE CHK-NON GI  12/26/2017   MYOMECTOMY      Allergies: Patient has no known allergies.  Medications: Prior to Admission medications   Medication Sig Start Date End Date Taking? Authorizing  Provider  leuprolide (LUPRON DEPOT, 71-MONTH,) 11.25 MG injection Inject 11.25 mg into the muscle every 3 (three) months. 03/20/19   Anyanwu, Sallyanne Havers, MD  norethindrone (AYGESTIN) 5 MG tablet Take one tablet 66m once per day until bleeding stops. Once bleeding stops can go back 10 mg. Please scheduled appointment. 06/19/19   Anyanwu, USallyanne Havers MD  XARELTO 20 MG TABS tablet TAKE 1 TABLET (20 MG TOTAL) BY MOUTH DAILY WITH SUPPER. 09/23/18   LVenia Carbon MD     Family History  Problem Relation Age of Onset   Diabetes Paternal Grandmother    Heart disease Neg Hx    Hypertension Neg Hx    Cancer Neg Hx        breast or colon cancer    Social History   Socioeconomic History   Marital status: Married    Spouse name: Not on file   Number of children: 1   Years of education: Not on file   Highest education level: Not on file  Occupational History   Occupation: SAnimal nutritionist- [employer]  Tobacco Use   Smoking status: Never Smoker   Smokeless tobacco: Never Used  Substance and Sexual Activity   Alcohol use: Yes    Comment: rare   Drug use: No   Sexual activity: Yes    Partners: Male    Birth control/protection: None  Other Topics Concern   Not on file  Social History Narrative   Not on file   Social Determinants of Health   Financial Resource Strain:  Difficulty of Paying Living Expenses: Not on file  Food Insecurity:    Worried About [place] in the Last Year: Not on file   Ran Out of Food in the Last Year: Not on file  Transportation Needs:    Lack of Transportation (Medical): Not on file   Lack of Transportation (Non-Medical): Not on file  Physical Activity:    Days of Exercise per Week: Not on file   Minutes of Exercise per Session: Not on file  Stress:    Feeling of Stress : Not on file  Social Connections:    Frequency of Communication with Friends and Family: Not on file   Frequency of Social  Gatherings with Friends and Family: Not on file   Attends Religious Services: Not on file   Active Member of Clubs or Organizations: Not on file   Attends Archivist Meetings: Not on file   Marital Status: Not on file    Review of Systems  Review of Systems: A 12 point ROS discussed and pertinent positives are indicated in the HPI above.  All other systems are negative.  Physical Exam No direct physical exam was performed, telephone visit only today because of Covid pandemic. Vital Signs: There were no vitals taken for this visit.  Imaging: [place] [facility] Pelvis Complete with Transvaginal  Result Date: 07/23/2019 CLINICAL DATA:  Surveillance for uterine fibroids. Status post myomectomy. History of ablation in 2016. EXAM: TRANSABDOMINAL AND TRANSVAGINAL ULTRASOUND OF PELVIS TECHNIQUE: Both transabdominal and transvaginal ultrasound examinations of the pelvis were performed. Transabdominal technique was performed for global imaging of the pelvis including uterus, ovaries, adnexal regions, and pelvic cul-de-sac. It was necessary to proceed with endovaginal exam following the transabdominal exam to visualize the endometrium. COMPARISON:  Pelvic ultrasound dated 08/27/2018. CT abdomen/pelvis dated 12/12/2017. MR pelvis dated 10/22/2017. FINDINGS: Uterus Measurements: 14.5 x 7.7 x 15.8 cm = volume: 919 mL. Thickening of the junctional zone of the myometrium, particularly in the posterior uterine body/fundus, reflecting extensive adenomyosis. This is best depicted on prior MR. No convincing coexistent discrete fibroids. Endometrium Thickness: 4 mm. Possible 9 x 4 x 9 mm echogenic endometrial lesion, equivocal, small endometrial polyp not excluded. Right ovary Right ovary is not discretely visualized. 3.9 x 3.8 x 2.5 cm simple right adnexal cyst. Left ovary Measurements: 2.6 x 2.1 x 2.5 cm = volume: 7.1 mL. Normal appearance/no adnexal mass. Other findings No abnormal free fluid. IMPRESSION:  Extensive uterine adenomyosis, particularly in the posterior uterine body/fundus, better evaluated on prior MR. No definite coexistent uterine fibroids. Possible 9 mm echogenic endometrial lesion, equivocal. If this reflects a true finding, a small endometrial polyp is not excluded. Left ovary is within normal limits. Right ovary is not discretely visualized. 3.9 cm simple right adnexal cyst, benign. Electronically Signed   By: Julian Hy M.D.   On: 07/23/2019 16:41    Labs:  CBC: Recent Labs    02/14/19 1110  WBC 3.6*  HGB 14.2  HCT 44.5  PLT 256    COAGS: No results for input(s): INR, APTT in the last 8760 hours.  BMP: No results for input(s): NA, K, CL, CO2, GLUCOSE, BUN, CALCIUM, CREATININE, GFRNONAA, GFRAA in the last 8760 hours.  Invalid input(s): CMP  LIVER FUNCTION TESTS: No results for input(s): BILITOT, AST, ALT, ALKPHOS, PROT, ALBUMIN in the last 8760 hours.  TUMOR MARKERS: No results for input(s): AFPTM, CEA, CA199, CHROMGRNA in the last 8760 hours.  Assessment and Plan:  Large dominant uterine  leiomyoma with associated abnormal uterine bleeding with some response from hormone therapy with Lupron and Aygestin.  Patient had a complicated history with recurrent DVT/IVC thrombus related to antiphospholipid syndrome.  She is on lifelong anticoagulation with Eliquis.  Follow-up ultrasound demonstrates dominant fibroid measures up to 14 cm, some decrease in size related to hormone therapy..  The uterine fibroid embolization procedure was described in detail.  The procedure, risk, benefits and alternatives were all reviewed.  She understands this procedure is done under conscious sedation and does require one overnight recovery for observation and pain management.  All questions were addressed.  I do think the procedure can be done with holding the Eliquis and a Lovenox bridge 2 days prior to the procedure.  She can be restarted on Eliquis the night of the procedure as  well.  After discussion, she would like to continue with the work-up which would include pelvic MRI without and with contrast to assess the fibroid anatomy for embolization.  This will be scheduled in the next few days.  Plan: Scheduled for pelvic MRI as above to assess fibroid anatomy.  Thank you for this interesting consult.  I greatly enjoyed meeting [name] and look forward to participating in their care.  A copy of this report was sent to the requesting provider on this date.  Electronically Signed: Greggory Keen 08/07/2019, 9:32 AM   I spent a total of  40 Minutes   in remote  clinical consultation, greater than 50% of which was counseling/coordinating care for this patient with symptomatic uterine fibroids and abnormal uterine bleeding..    Visit type: Audio only (telephone). Audio (no video) only due to patient's lack of internet/smartphone capability. Alternative for in-person consultation at [facility], [address], [place], [place]. This visit type was conducted due to national recommendations for restrictions regarding the COVID-19 Pandemic (e.g. social distancing).  This format is felt to be most appropriate for this patient at this time.  All issues noted in this document were discussed and addressed.

## 2019-08-18 ENCOUNTER — Ambulatory Visit: Payer: BC Managed Care – PPO | Admitting: Hematology and Oncology

## 2019-08-18 ENCOUNTER — Other Ambulatory Visit: Payer: BC Managed Care – PPO

## 2019-08-20 ENCOUNTER — Ambulatory Visit (HOSPITAL_COMMUNITY)
Admission: RE | Admit: 2019-08-20 | Discharge: 2019-08-20 | Disposition: A | Discharge status: Home / Self Care | Payer: BC Managed Care – PPO | Source: Ambulatory Visit | Attending: Interventional Radiology | Admitting: Interventional Radiology

## 2019-08-20 ENCOUNTER — Other Ambulatory Visit: Payer: Self-pay

## 2019-08-20 DIAGNOSIS — D250 Submucous leiomyoma of uterus: Secondary | ICD-10-CM | POA: Insufficient documentation

## 2019-08-20 MED ORDER — GADOBUTROL 1 MMOL/ML IV SOLN
10.0000 mL | Freq: Once | INTRAVENOUS | Status: AC | PRN
  Administered 2019-08-20: 17:00:00 10 mL via INTRAVENOUS

## 2019-08-24 NOTE — Progress Notes (Signed)
Patient Care Team: Venia Carbon, MD as PCP - General  DIAGNOSIS:    ICD-10-CM   1. Iron deficiency anemia due to chronic blood loss  D50.0 Ferritin    Iron and TIBC    CBC with Differential (Cancer Center Only)  2. Acute blood loss anemia  D62 Ferritin    Iron and TIBC    CBC with Differential (Cancer Center Only)  3. Recurrent deep vein thrombosis (DVT) (HCC)  I82.409     CHIEF COMPLIANT: Follow-up of iron deficiency anemia and recurrent DVTs  INTERVAL HISTORY: [name] is a 43 y.o. 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 She presents to the clinic today to review her labs.   REVIEW OF SYSTEMS:   Constitutional: Denies fevers, chills or abnormal weight loss Eyes: Denies blurriness of vision Ears, nose, mouth, throat, and face: Denies mucositis or sore throat Respiratory: Denies cough, dyspnea or wheezes Cardiovascular: Denies palpitation, chest discomfort Gastrointestinal: Denies nausea, heartburn or change in bowel habits Skin: Denies abnormal skin rashes Lymphatics: Denies new lymphadenopathy or easy bruising Neurological: Denies numbness, tingling or new weaknesses Behavioral/Psych: Mood is stable, no new changes  Extremities: No lower extremity edema Breast: denies any pain or lumps or nodules in either breasts All other systems were reviewed with the patient and are negative.  I have reviewed the past medical history, past surgical history, social history and family history with the patient and they are unchanged from previous note.  ALLERGIES:  has No Known Allergies.  MEDICATIONS:  Current Outpatient Medications  Medication Sig Dispense Refill  . leuprolide (LUPRON DEPOT, 74-MONTH,) 11.25 MG injection Inject 11.25 mg into the muscle every 3 (three) months. 1 each 4  . norethindrone (AYGESTIN) 5 MG tablet Take one tablet 23m once per day  until bleeding stops. Once bleeding stops can go back 10 mg. Please scheduled appointment. 180 tablet 5  . XARELTO 20 MG TABS tablet TAKE 1 TABLET (20 MG TOTAL) BY MOUTH DAILY WITH SUPPER. 90 tablet 3   No current facility-administered medications for this visit.   Facility-Administered Medications Ordered in Other Visits  Medication Dose Route Frequency Provider Last Rate Last Admin  . oxyCODONE-acetaminophen (PERCOCET/ROXICET) 5-325 MG per tablet 2 tablet  2 tablet Oral Once THarle Stanford, PA-C        PHYSICAL EXAMINATION: ECOG PERFORMANCE STATUS: 1 - Symptomatic but completely ambulatory  Vitals:   08/25/19 1055  BP: 133/85  Pulse: 100  Resp: 20  Temp: 98 F (36.7 C)  SpO2: 100%   Filed Weights   08/25/19 1055  Weight: 240 lb (108.9 kg)    GENERAL: alert, no distress and comfortable SKIN: skin color, texture, turgor are normal, no rashes or significant lesions EYES: normal, Conjunctiva are pink and non-injected, sclera clear OROPHARYNX: no exudate, no erythema and lips, buccal mucosa, and tongue normal  NECK: supple, thyroid normal size, non-tender, without nodularity LYMPH: no palpable lymphadenopathy in the cervical, axillary or inguinal LUNGS: clear to auscultation and percussion with normal breathing effort HEART: regular rate & rhythm and no murmurs and no lower extremity edema ABDOMEN: abdomen soft, non-tender and normal bowel sounds MUSCULOSKELETAL: no cyanosis of digits and no clubbing  NEURO: alert & oriented x 3 with fluent speech, no focal motor/sensory deficits EXTREMITIES: No lower extremity edema  LABORATORY DATA:  I have reviewed the data as listed CMP Latest Ref Rng & Units 12/27/2017  11/22/2017 11/21/2017  Glucose 70 - 140 mg/dL 88 99 101(H)  BUN 7 - 26 mg/dL 4(L) 6 6  Creatinine 0.60 - 1.10 mg/dL 0.79 0.66 0.62  Sodium 136 - 145 mmol/L 139 144 140  Potassium 3.5 - 5.1 mmol/L 3.4(L) 3.6 3.1(L)  Chloride 98 - 109 mmol/L 104 113(H) 108  CO2 22 - 29  mmol/L 27 23 24   Calcium 8.4 - 10.4 mg/dL 9.2 8.3(L) 8.2(L)  Total Protein 6.4 - 8.3 g/dL 7.9 - -  Total Bilirubin 0.2 - 1.2 mg/dL 0.3 - -  Alkaline Phos 40 - 150 U/L 59 - -  AST 5 - 34 U/L 14 - -  ALT 0 - 55 U/L 11 - -    Lab Results  Component Value Date   WBC 4.7 08/25/2019   HGB 11.0 (L) 08/25/2019   HCT 36.6 08/25/2019   MCV 74.2 (L) 08/25/2019   PLT 327 08/25/2019   NEUTROABS 2.3 08/25/2019    ASSESSMENT & PLAN:  Acute blood loss anemia Blood loss anemia with iron deficiency:2 units of blood given for hemoglobin of 6.8 in May 2019 She has chronic uterine bleeding. She is planning to do a ablation procedure later next year.  Received IV iron treatment 04/26/2018 and 05/04/2018; November 2019, June 2020  Lab review: Hemoglobin is 11, MCV 74.2 Iron studies are pending.  I suspect that she will be iron deficient. Return to clinic in 6 months with labs and follow-up  Recurrent deep vein thrombosis (DVT) ([place]) Recurrent DVTs with antiphospholipid antibody syndrome: On anticoagulation.  Patient will need to remain on blood thinners for life.  I will call her with the results of the iron studies and then set her up for IV iron infusions.  Orders Placed This Encounter  Procedures  . Ferritin    Standing Status:   Future    Standing Expiration Date:   08/24/2020  . Iron and TIBC    Standing Status:   Future    Standing Expiration Date:   08/24/2020  . CBC with Differential (Cancer Center Only)    Standing Status:   Future    Standing Expiration Date:   08/24/2020   The patient has a good understanding of the overall plan. she agrees with it. she will call with any problems that may develop before the next visit here.  Nicholas Lose, MD 08/25/2019  Julious Oka Dorshimer, am acting as scribe for Dr. Nicholas Lose.  I have reviewed the above document for accuracy and completeness, and I agree with the above.

## 2019-08-25 ENCOUNTER — Inpatient Hospital Stay: Payer: BC Managed Care – PPO | Attending: Hematology and Oncology | Admitting: Hematology and Oncology

## 2019-08-25 ENCOUNTER — Other Ambulatory Visit: Payer: Self-pay | Admitting: Hematology and Oncology

## 2019-08-25 ENCOUNTER — Other Ambulatory Visit: Payer: Self-pay

## 2019-08-25 ENCOUNTER — Other Ambulatory Visit: Payer: Self-pay | Admitting: Obstetrics & Gynecology

## 2019-08-25 ENCOUNTER — Telehealth: Payer: Self-pay | Admitting: Hematology and Oncology

## 2019-08-25 ENCOUNTER — Inpatient Hospital Stay: Payer: BC Managed Care – PPO

## 2019-08-25 VITALS — BP 133/85 | HR 100 | Temp 98.0°F | Resp 20 | Ht 69.0 in | Wt 240.0 lb

## 2019-08-25 DIAGNOSIS — Z1231 Encounter for screening mammogram for malignant neoplasm of breast: Secondary | ICD-10-CM

## 2019-08-25 DIAGNOSIS — D500 Iron deficiency anemia secondary to blood loss (chronic): Secondary | ICD-10-CM | POA: Insufficient documentation

## 2019-08-25 DIAGNOSIS — I82409 Acute embolism and thrombosis of unspecified deep veins of unspecified lower extremity: Secondary | ICD-10-CM | POA: Diagnosis not present

## 2019-08-25 DIAGNOSIS — Z793 Long term (current) use of hormonal contraceptives: Secondary | ICD-10-CM | POA: Insufficient documentation

## 2019-08-25 DIAGNOSIS — N920 Excessive and frequent menstruation with regular cycle: Secondary | ICD-10-CM | POA: Diagnosis not present

## 2019-08-25 DIAGNOSIS — Z7901 Long term (current) use of anticoagulants: Secondary | ICD-10-CM | POA: Diagnosis not present

## 2019-08-25 DIAGNOSIS — D62 Acute posthemorrhagic anemia: Secondary | ICD-10-CM

## 2019-08-25 DIAGNOSIS — Z86718 Personal history of other venous thrombosis and embolism: Secondary | ICD-10-CM | POA: Insufficient documentation

## 2019-08-25 LAB — IRON AND TIBC
Iron: 22 ug/dL — ABNORMAL LOW (ref 41–142)
Saturation Ratios: 4 % — ABNORMAL LOW (ref 21–57)
TIBC: 566 ug/dL — ABNORMAL HIGH (ref 236–444)
UIBC: 544 ug/dL — ABNORMAL HIGH (ref 120–384)

## 2019-08-25 LAB — CBC WITH DIFFERENTIAL (CANCER CENTER ONLY)
Abs Immature Granulocytes: 0.02 10*3/uL (ref 0.00–0.07)
Basophils Absolute: 0 10*3/uL (ref 0.0–0.1)
Basophils Relative: 0 %
Eosinophils Absolute: 0.2 10*3/uL (ref 0.0–0.5)
Eosinophils Relative: 4 %
HCT: 36.6 % (ref 36.0–46.0)
Hemoglobin: 11 g/dL — ABNORMAL LOW (ref 12.0–15.0)
Immature Granulocytes: 0 %
Lymphocytes Relative: 37 %
Lymphs Abs: 1.7 10*3/uL (ref 0.7–4.0)
MCH: 22.3 pg — ABNORMAL LOW (ref 26.0–34.0)
MCHC: 30.1 g/dL (ref 30.0–36.0)
MCV: 74.2 fL — ABNORMAL LOW (ref 80.0–100.0)
Monocytes Absolute: 0.4 10*3/uL (ref 0.1–1.0)
Monocytes Relative: 9 %
Neutro Abs: 2.3 10*3/uL (ref 1.7–7.7)
Neutrophils Relative %: 50 %
Platelet Count: 327 10*3/uL (ref 150–400)
RBC: 4.93 MIL/uL (ref 3.87–5.11)
RDW: 15.1 % (ref 11.5–15.5)
WBC Count: 4.7 10*3/uL (ref 4.0–10.5)
nRBC: 0 % (ref 0.0–0.2)

## 2019-08-25 LAB — FERRITIN: Ferritin: 4 ng/mL — ABNORMAL LOW (ref 11–307)

## 2019-08-25 NOTE — Progress Notes (Signed)
I informed the patient of the ferritin is 4 and that she will need IV iron treatments.  We will ask the schedulers to set her up for IV iron.

## 2019-08-25 NOTE — Assessment & Plan Note (Signed)
Blood loss anemia with iron deficiency:2 units of blood given for hemoglobin of 6.8 in May 2019 Received IV iron treatment 04/26/2018 and 05/04/2018; November 2019, June 2020  Lab review: Return to clinic in 6 months with labs and follow-up

## 2019-08-25 NOTE — Assessment & Plan Note (Signed)
Recurrent DVTs with antiphospholipid antibody syndrome: On anticoagulation.  Patient will need to remain on blood thinners for life.

## 2019-08-25 NOTE — Telephone Encounter (Signed)
Scheduled per 12/28 los, patient has been called and notified.

## 2019-08-26 ENCOUNTER — Telehealth: Payer: Self-pay | Admitting: Hematology and Oncology

## 2019-08-26 NOTE — Telephone Encounter (Signed)
Scheduled appt per 12/28 sch message - pt is aware of appt date and time

## 2019-09-01 ENCOUNTER — Ambulatory Visit: Payer: BC Managed Care – PPO

## 2019-09-04 ENCOUNTER — Inpatient Hospital Stay: Payer: BC Managed Care – PPO | Attending: Hematology and Oncology

## 2019-09-04 ENCOUNTER — Other Ambulatory Visit: Payer: Self-pay | Admitting: Interventional Radiology

## 2019-09-04 ENCOUNTER — Other Ambulatory Visit: Payer: Self-pay

## 2019-09-04 VITALS — BP 122/81 | HR 93 | Temp 98.3°F | Resp 18

## 2019-09-04 DIAGNOSIS — D500 Iron deficiency anemia secondary to blood loss (chronic): Secondary | ICD-10-CM | POA: Diagnosis not present

## 2019-09-04 DIAGNOSIS — N920 Excessive and frequent menstruation with regular cycle: Secondary | ICD-10-CM | POA: Diagnosis not present

## 2019-09-04 DIAGNOSIS — D62 Acute posthemorrhagic anemia: Secondary | ICD-10-CM

## 2019-09-04 DIAGNOSIS — D250 Submucous leiomyoma of uterus: Secondary | ICD-10-CM

## 2019-09-04 MED ORDER — ACETAMINOPHEN 325 MG PO TABS
650.0000 mg | ORAL_TABLET | Freq: Once | ORAL | Status: AC
  Administered 2019-09-04: 650 mg via ORAL

## 2019-09-04 MED ORDER — SODIUM CHLORIDE 0.9 % IV SOLN
Freq: Once | INTRAVENOUS | Status: AC
  Filled 2019-09-04: qty 250

## 2019-09-04 MED ORDER — SODIUM CHLORIDE 0.9 % IV SOLN
200.0000 mg | Freq: Once | INTRAVENOUS | Status: AC
  Administered 2019-09-04: 200 mg via INTRAVENOUS
  Filled 2019-09-04: qty 10

## 2019-09-04 MED ORDER — ACETAMINOPHEN 325 MG PO TABS
ORAL_TABLET | ORAL | Status: AC
  Filled 2019-09-04: qty 2

## 2019-09-04 NOTE — Patient Instructions (Signed)

## 2019-09-08 ENCOUNTER — Encounter: Payer: Self-pay | Admitting: *Deleted

## 2019-09-08 ENCOUNTER — Telehealth: Payer: Self-pay | Admitting: Hematology and Oncology

## 2019-09-08 NOTE — Telephone Encounter (Signed)
Scheduled appt per 1/11 sch message - unable to reach pt . Left message with appt dates and time

## 2019-09-08 NOTE — Telephone Encounter (Signed)
Vmail full- unable to leave message with appts. Added note in for next appt for RN to print updated schedule for patient .

## 2019-09-09 ENCOUNTER — Telehealth: Payer: Self-pay | Admitting: Hematology and Oncology

## 2019-09-09 NOTE — Telephone Encounter (Signed)
Returned patient's phone call regarding cancelling 01/22,1/29 and 02/05 appointments, per 01/12 scheduled message appointment has been cancelled. Patient's voicemail is full.

## 2019-09-12 ENCOUNTER — Inpatient Hospital Stay: Payer: BC Managed Care – PPO

## 2019-09-12 ENCOUNTER — Other Ambulatory Visit: Payer: Self-pay

## 2019-09-12 VITALS — BP 117/77 | HR 90 | Temp 98.7°F | Resp 16

## 2019-09-12 DIAGNOSIS — D500 Iron deficiency anemia secondary to blood loss (chronic): Secondary | ICD-10-CM

## 2019-09-12 DIAGNOSIS — D62 Acute posthemorrhagic anemia: Secondary | ICD-10-CM

## 2019-09-12 MED ORDER — ACETAMINOPHEN 325 MG PO TABS
ORAL_TABLET | ORAL | Status: AC
  Filled 2019-09-12: qty 2

## 2019-09-12 MED ORDER — SODIUM CHLORIDE 0.9 % IV SOLN
200.0000 mg | Freq: Once | INTRAVENOUS | Status: AC
  Administered 2019-09-12: 200 mg via INTRAVENOUS
  Filled 2019-09-12: qty 200

## 2019-09-12 MED ORDER — SODIUM CHLORIDE 0.9 % IV SOLN
Freq: Once | INTRAVENOUS | Status: AC
  Filled 2019-09-12: qty 250

## 2019-09-12 MED ORDER — ACETAMINOPHEN 325 MG PO TABS
650.0000 mg | ORAL_TABLET | Freq: Once | ORAL | Status: AC
  Administered 2019-09-12: 650 mg via ORAL

## 2019-09-12 NOTE — Patient Instructions (Signed)

## 2019-09-14 ENCOUNTER — Telehealth: Payer: Self-pay | Admitting: Internal Medicine

## 2019-09-15 NOTE — Telephone Encounter (Signed)
Last prescribed on 09/23/2018 #90 with 3 refills . Last appointment on 11/15/2018 (acute). No future appointment

## 2019-09-15 NOTE — Telephone Encounter (Signed)
Please schedule her for a physical in the next few months

## 2019-09-17 NOTE — Telephone Encounter (Signed)
Patient scheduled appointment on 02/06/20.

## 2019-09-17 NOTE — Telephone Encounter (Signed)
I left a detailed message on patient's voice mail to call back and schedule a physical in the next couple months.

## 2019-09-19 ENCOUNTER — Ambulatory Visit: Payer: BC Managed Care – PPO

## 2019-09-19 ENCOUNTER — Inpatient Hospital Stay: Payer: BC Managed Care – PPO

## 2019-09-19 ENCOUNTER — Other Ambulatory Visit: Payer: Self-pay

## 2019-09-19 VITALS — BP 123/79 | HR 86 | Temp 98.8°F | Resp 18

## 2019-09-19 DIAGNOSIS — D500 Iron deficiency anemia secondary to blood loss (chronic): Secondary | ICD-10-CM | POA: Diagnosis not present

## 2019-09-19 DIAGNOSIS — D62 Acute posthemorrhagic anemia: Secondary | ICD-10-CM

## 2019-09-19 MED ORDER — SODIUM CHLORIDE 0.9 % IV SOLN
200.0000 mg | Freq: Once | INTRAVENOUS | Status: AC
  Administered 2019-09-19: 200 mg via INTRAVENOUS
  Filled 2019-09-19: qty 200

## 2019-09-19 MED ORDER — SODIUM CHLORIDE 0.9 % IV SOLN
Freq: Once | INTRAVENOUS | Status: AC
  Filled 2019-09-19: qty 250

## 2019-09-19 NOTE — Patient Instructions (Signed)

## 2019-09-26 ENCOUNTER — Ambulatory Visit: Payer: BC Managed Care – PPO

## 2019-09-26 ENCOUNTER — Ambulatory Visit
Admission: RE | Admit: 2019-09-26 | Discharge: 2019-09-26 | Disposition: A | Discharge status: Home / Self Care | Payer: BC Managed Care – PPO | Source: Ambulatory Visit | Attending: Obstetrics & Gynecology | Admitting: Obstetrics & Gynecology

## 2019-09-26 ENCOUNTER — Inpatient Hospital Stay: Payer: BC Managed Care – PPO

## 2019-09-26 ENCOUNTER — Other Ambulatory Visit: Payer: Self-pay

## 2019-09-26 VITALS — BP 127/83 | HR 81 | Temp 98.2°F | Resp 18

## 2019-09-26 DIAGNOSIS — D500 Iron deficiency anemia secondary to blood loss (chronic): Secondary | ICD-10-CM | POA: Diagnosis not present

## 2019-09-26 DIAGNOSIS — D62 Acute posthemorrhagic anemia: Secondary | ICD-10-CM

## 2019-09-26 DIAGNOSIS — Z1231 Encounter for screening mammogram for malignant neoplasm of breast: Secondary | ICD-10-CM

## 2019-09-26 MED ORDER — SODIUM CHLORIDE 0.9 % IV SOLN
Freq: Once | INTRAVENOUS | Status: AC
  Filled 2019-09-26: qty 250

## 2019-09-26 MED ORDER — SODIUM CHLORIDE 0.9 % IV SOLN
200.0000 mg | Freq: Once | INTRAVENOUS | Status: AC
  Administered 2019-09-26: 200 mg via INTRAVENOUS
  Filled 2019-09-26: qty 200

## 2019-09-26 NOTE — Progress Notes (Signed)
Patient declined to stay for 30 minute post observation period. Pt discharged in stable condition with no complaints. VSS

## 2019-10-03 ENCOUNTER — Ambulatory Visit: Payer: BC Managed Care – PPO

## 2019-10-03 ENCOUNTER — Inpatient Hospital Stay: Payer: BC Managed Care – PPO | Attending: Hematology and Oncology

## 2019-10-03 ENCOUNTER — Other Ambulatory Visit: Payer: Self-pay

## 2019-10-03 VITALS — BP 120/86 | HR 93 | Temp 98.7°F | Resp 18

## 2019-10-03 DIAGNOSIS — D500 Iron deficiency anemia secondary to blood loss (chronic): Secondary | ICD-10-CM

## 2019-10-03 DIAGNOSIS — D62 Acute posthemorrhagic anemia: Secondary | ICD-10-CM

## 2019-10-03 MED ORDER — SODIUM CHLORIDE 0.9 % IV SOLN
Freq: Once | INTRAVENOUS | Status: AC
  Filled 2019-10-03: qty 250

## 2019-10-03 MED ORDER — SODIUM CHLORIDE 0.9 % IV SOLN
200.0000 mg | Freq: Once | INTRAVENOUS | Status: AC
  Administered 2019-10-03: 200 mg via INTRAVENOUS
  Filled 2019-10-03: qty 200

## 2019-10-03 NOTE — Patient Instructions (Signed)

## 2019-10-03 NOTE — Progress Notes (Signed)
Patient declines to stay 30 minute observation period. All vitals signs stable. IV removed and patient sent home.

## 2019-10-15 ENCOUNTER — Other Ambulatory Visit: Payer: Self-pay

## 2019-10-15 ENCOUNTER — Ambulatory Visit
Admission: RE | Admit: 2019-10-15 | Discharge: 2019-10-15 | Disposition: A | Discharge status: Home / Self Care | Payer: BC Managed Care – PPO | Source: Ambulatory Visit | Attending: Interventional Radiology | Admitting: Interventional Radiology

## 2019-10-15 DIAGNOSIS — D250 Submucous leiomyoma of uterus: Secondary | ICD-10-CM

## 2019-10-15 HISTORY — PX: IR RADIOLOGIST EVAL & MGMT: IMG5224

## 2019-10-15 NOTE — Progress Notes (Signed)
Patient ID: [name], female   DOB: Jun 26, 1976, 44 y.o.   MRN: [id number]       Chief Complaint:  Abnormal uterine bleeding  Referring Physician(s): Anyanwu  History of Present Illness: [name] is a 44 y.o. female with a chronic history of abnormal menstrual bleeding related to an enlarged uterus and predominantly secondary to a posterior adenomyoma and diffuse uterine adenomyosis.  She does have a few small fibroids largest measuring 2.4 cm.  She has been managed with hormone therapy with some relief.  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  She also has antiphospholipid syndrome.  No current abnormal discharge or bleeding.  No significant pelvic pain or bulk related symptoms.  Because of her comorbidities including the chronic anemia, antiphospholipid syndrome, chronic anticoagulation for recurrent DVT, and morbid obesity she is high risk for surgery.  Pelvic MR imaging has been performed August 19, 2021 reviewed with the patient.  Past Medical History:  Diagnosis Date  . Anemia 09/2017   REQUIRING A TRANSFUSION  . Antiphospholipid syndrome ([place])   . Breast cyst    BILATERAL  . DVT (deep venous thrombosis) ([place])   . Dysfunctional uterine bleeding   . Fibroids   . Obesity     Past Surgical History:  Procedure Laterality Date  . CESAREAN SECTION    . DILITATION & CURRETTAGE/HYSTROSCOPY WITH HYDROTHERMAL ABLATION N/A 10/08/2014   Procedure: DILATATION & CURETTAGE/HYSTEROSCOPY WITH HYDROTHERMAL ABLATION;  Surgeon: Osborne Oman, MD;  Location: [facility];  Service: Gynecology;  Laterality: N/A;  . IR RADIOLOGIST EVAL & MGMT  10/10/2017  . IR RADIOLOGIST EVAL & MGMT  11/28/2017  . IR RADIOLOGIST EVAL & MGMT  12/12/2017  . IR RADIOLOGIST EVAL & MGMT  08/07/2019  . IR SINUS/FIST TUBE CHK-NON GI  12/26/2017  . MYOMECTOMY      Allergies: Patient has no known allergies.  Medications: Prior to  Admission medications   Medication Sig Start Date End Date Taking? Authorizing Provider  leuprolide (LUPRON DEPOT, 84-MONTH,) 11.25 MG injection Inject 11.25 mg into the muscle every 3 (three) months. 03/20/19   Anyanwu, Sallyanne Havers, MD  norethindrone (AYGESTIN) 5 MG tablet Take one tablet 59m once per day until bleeding stops. Once bleeding stops can go back 10 mg. Please scheduled appointment. 06/19/19   Anyanwu, USallyanne Havers MD  XARELTO 20 MG TABS tablet TAKE 1 TABLET (20 MG TOTAL) BY MOUTH DAILY WITH SUPPER. 09/15/19   LVenia Carbon MD     Family History  Problem Relation Age of Onset  . Diabetes Paternal Grandmother   . Heart disease Neg Hx   . Hypertension Neg Hx   . Cancer Neg Hx        breast or colon cancer    Social History   Socioeconomic History  . Marital status: Married    Spouse name: Not on file  . Number of children: 1  . Years of education: Not on file  . Highest education level: Not on file  Occupational History  . Occupation: SAnimal nutritionist- [employer]  Tobacco Use  . Smoking status: Never Smoker  . Smokeless tobacco: Never Used  Substance and Sexual Activity  . Alcohol use: Yes    Comment: rare  . Drug use: No  . Sexual activity: Yes    Partners: Male    Birth control/protection: None  Other Topics Concern  . Not on file  Social  History Narrative  . Not on file   Social Determinants of Health   Financial Resource Strain:   . Difficulty of Paying Living Expenses: Not on file  Food Insecurity:   . Worried About Charity fundraiser in the Last Year: Not on file  . Ran Out of Food in the Last Year: Not on file  Transportation Needs:   . Lack of Transportation (Medical): Not on file  . Lack of Transportation (Non-Medical): Not on file  Physical Activity:   . Days of Exercise per Week: Not on file  . Minutes of Exercise per Session: Not on file  Stress:   . Feeling of Stress : Not on file  Social Connections:   . Frequency  of Communication with Friends and Family: Not on file  . Frequency of Social Gatherings with Friends and Family: Not on file  . Attends Religious Services: Not on file  . Active Member of Clubs or Organizations: Not on file  . Attends Archivist Meetings: Not on file  . Marital Status: Not on file     Review of Systems  Review of Systems: A 12 point ROS discussed and pertinent positives are indicated in the HPI above.  All other systems are negative.  Physical Exam No direct physical exam was performed, telephone health visit only today because of Covid pandemic Vital Signs: There were no vitals taken for this visit.  Imaging: MM 3D SCREEN BREAST BILATERAL  Result Date: 09/30/2019 CLINICAL DATA:  Screening. EXAM: DIGITAL SCREENING BILATERAL MAMMOGRAM WITH TOMO AND CAD COMPARISON:  Previous exam(s). ACR Breast Density Category c: The breast tissue is heterogeneously dense, which may obscure small masses. FINDINGS: There are no findings suspicious for malignancy. Images were processed with CAD. IMPRESSION: No mammographic evidence of malignancy. A result letter of this screening mammogram will be mailed directly to the patient. RECOMMENDATION: Screening mammogram in one year. (Code:SM-B-01Y) BI-RADS CATEGORY  1: Negative. Electronically Signed   By: Kristopher Oppenheim M.D.   On: 09/30/2019 09:47    Labs:  CBC: Recent Labs    02/14/19 1110 08/25/19 1035  WBC 3.6* 4.7  HGB 14.2 11.0*  HCT 44.5 36.6  PLT 256 327    COAGS: No results for input(s): INR, APTT in the last 8760 hours.  BMP: No results for input(s): NA, K, CL, CO2, GLUCOSE, BUN, CALCIUM, CREATININE, GFRNONAA, GFRAA in the last 8760 hours.  Invalid input(s): CMP  LIVER FUNCTION TESTS: No results for input(s): BILITOT, AST, ALT, ALKPHOS, PROT, ALBUMIN in the last 8760 hours.  TUMOR MARKERS: No results for input(s): AFPTM, CEA, CA199, CHROMGRNA in the last 8760 hours.  Assessment and Plan:  Abnormal  uterine bleeding with some response from hormone therapy.  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  She has a few small fibroids noted but nothing over 2.4 cm.  Complicated medical history with recurrent DVT/IVC thrombus related to antiphospholipid syndrome.  She is on lifelong anticoagulation with Xarelto.  She also receives iron infusions for a chronic anemia.  We had a lengthy discussion today about treatment options for chronic severe and progressive adenomyosis.  Uterine  embolization has been used to treat adenomyosis however has a slight lower success rate and also a minimally increased recurrence rate when compared to fibroid therapy.  However, given her high risk for surgery, I think uterine embolization is a low risk procedure for this patient and  may prove beneficial.  All questions were addressed.  After discussion she would like to proceed with scheduling the procedure in the next few weeks at [facility].  Plan: Scheduled for uterine embolization for adenomyosis at [facility].  Because this is still a low risk interventional procedure, she actually can remain on the Xarelto based on our recent updated anticoagulation guidelines.  Thank you for this interesting consult.  I greatly enjoyed meeting [name] and look forward to participating in their care.  A copy of this report was sent to the requesting provider on this date.  Electronically Signed: Greggory Keen 10/15/2019, 2:11 PM   I spent a total of    40 Minutes in remote  clinical consultation, greater than 50% of which was counseling/coordinating care for this patient with symptomatic uterine adenomyosis.    Visit type: Audio only (telephone). Audio (no video) only due to patient's lack of internet/smartphone capability. Alternative for in-person consultation at [facility], [address], [place], [place]. This visit type was conducted due to national recommendations for restrictions regarding the COVID-19 Pandemic (e.g. social distancing).  This format is felt to be most appropriate for this patient at this time.  All issues noted in this document were discussed and addressed.

## 2019-10-16 ENCOUNTER — Encounter: Payer: Self-pay | Admitting: *Deleted

## 2019-10-21 ENCOUNTER — Other Ambulatory Visit (HOSPITAL_COMMUNITY): Payer: Self-pay | Admitting: Interventional Radiology

## 2019-10-21 DIAGNOSIS — N8003 Adenomyosis of the uterus: Secondary | ICD-10-CM

## 2019-11-29 ENCOUNTER — Other Ambulatory Visit (HOSPITAL_COMMUNITY): Payer: BC Managed Care – PPO

## 2019-12-01 ENCOUNTER — Other Ambulatory Visit (HOSPITAL_COMMUNITY)
Admission: RE | Admit: 2019-12-01 | Discharge: 2019-12-01 | Disposition: A | Discharge status: Home / Self Care | Payer: BC Managed Care – PPO | Source: Ambulatory Visit | Attending: Interventional Radiology | Admitting: Interventional Radiology

## 2019-12-01 ENCOUNTER — Other Ambulatory Visit: Payer: Self-pay | Admitting: Radiology

## 2019-12-01 DIAGNOSIS — Z01812 Encounter for preprocedural laboratory examination: Secondary | ICD-10-CM | POA: Diagnosis present

## 2019-12-01 DIAGNOSIS — Z20822 Contact with and (suspected) exposure to covid-19: Secondary | ICD-10-CM | POA: Diagnosis not present

## 2019-12-01 LAB — SARS CORONAVIRUS 2 (TAT 6-24 HRS): SARS Coronavirus 2: NEGATIVE

## 2019-12-03 ENCOUNTER — Observation Stay (HOSPITAL_COMMUNITY)
Admission: RE | Admit: 2019-12-03 | Discharge: 2019-12-04 | Disposition: A | Discharge status: Home / Self Care | Payer: BC Managed Care – PPO | Source: Ambulatory Visit | Attending: Interventional Radiology | Admitting: Interventional Radiology

## 2019-12-03 ENCOUNTER — Encounter (HOSPITAL_COMMUNITY): Payer: Self-pay

## 2019-12-03 ENCOUNTER — Other Ambulatory Visit: Payer: Self-pay

## 2019-12-03 ENCOUNTER — Ambulatory Visit (HOSPITAL_COMMUNITY)
Admission: RE | Admit: 2019-12-03 | Discharge: 2019-12-03 | Disposition: A | Discharge status: Home / Self Care | Payer: BC Managed Care – PPO | Source: Ambulatory Visit | Attending: Interventional Radiology | Admitting: Interventional Radiology

## 2019-12-03 VITALS — BP 141/89 | HR 96 | Temp 98.0°F | Resp 18

## 2019-12-03 DIAGNOSIS — D259 Leiomyoma of uterus, unspecified: Secondary | ICD-10-CM | POA: Insufficient documentation

## 2019-12-03 DIAGNOSIS — Z79899 Other long term (current) drug therapy: Secondary | ICD-10-CM | POA: Diagnosis not present

## 2019-12-03 DIAGNOSIS — Z7901 Long term (current) use of anticoagulants: Secondary | ICD-10-CM | POA: Insufficient documentation

## 2019-12-03 DIAGNOSIS — Z86718 Personal history of other venous thrombosis and embolism: Secondary | ICD-10-CM | POA: Insufficient documentation

## 2019-12-03 DIAGNOSIS — N8003 Adenomyosis of the uterus: Secondary | ICD-10-CM

## 2019-12-03 DIAGNOSIS — N938 Other specified abnormal uterine and vaginal bleeding: Secondary | ICD-10-CM | POA: Diagnosis present

## 2019-12-03 DIAGNOSIS — N800 Endometriosis of uterus: Principal | ICD-10-CM | POA: Insufficient documentation

## 2019-12-03 DIAGNOSIS — D649 Anemia, unspecified: Secondary | ICD-10-CM | POA: Insufficient documentation

## 2019-12-03 DIAGNOSIS — D6861 Antiphospholipid syndrome: Secondary | ICD-10-CM | POA: Diagnosis not present

## 2019-12-03 HISTORY — PX: IR US GUIDE VASC ACCESS LEFT: IMG2389

## 2019-12-03 HISTORY — PX: IR EMBO TUMOR ORGAN ISCHEMIA INFARCT INC GUIDE ROADMAPPING: IMG5449

## 2019-12-03 HISTORY — PX: IR ANGIOGRAM SELECTIVE EACH ADDITIONAL VESSEL: IMG667

## 2019-12-03 HISTORY — PX: IR ANGIOGRAM PELVIS SELECTIVE OR SUPRASELECTIVE: IMG661

## 2019-12-03 HISTORY — DX: Other specified abnormal uterine and vaginal bleeding: N93.8

## 2019-12-03 HISTORY — PX: IR US GUIDE VASC ACCESS RIGHT: IMG2390

## 2019-12-03 LAB — CBC WITH DIFFERENTIAL/PLATELET
Abs Immature Granulocytes: 0.02 10*3/uL (ref 0.00–0.07)
Basophils Absolute: 0 10*3/uL (ref 0.0–0.1)
Basophils Relative: 1 %
Eosinophils Absolute: 0.2 10*3/uL (ref 0.0–0.5)
Eosinophils Relative: 4 %
HCT: 46.2 % — ABNORMAL HIGH (ref 36.0–46.0)
Hemoglobin: 14 g/dL (ref 12.0–15.0)
Immature Granulocytes: 1 %
Lymphocytes Relative: 36 %
Lymphs Abs: 1.6 10*3/uL (ref 0.7–4.0)
MCH: 23.5 pg — ABNORMAL LOW (ref 26.0–34.0)
MCHC: 30.3 g/dL (ref 30.0–36.0)
MCV: 77.6 fL — ABNORMAL LOW (ref 80.0–100.0)
Monocytes Absolute: 0.4 10*3/uL (ref 0.1–1.0)
Monocytes Relative: 10 %
Neutro Abs: 2.2 10*3/uL (ref 1.7–7.7)
Neutrophils Relative %: 48 %
Platelets: 279 10*3/uL (ref 150–400)
RBC: 5.95 MIL/uL — ABNORMAL HIGH (ref 3.87–5.11)
RDW: 17.9 % — ABNORMAL HIGH (ref 11.5–15.5)
WBC: 4.4 10*3/uL (ref 4.0–10.5)
nRBC: 0 % (ref 0.0–0.2)

## 2019-12-03 LAB — BASIC METABOLIC PANEL
Anion gap: 8 (ref 5–15)
BUN: 10 mg/dL (ref 6–20)
CO2: 22 mmol/L (ref 22–32)
Calcium: 9.5 mg/dL (ref 8.9–10.3)
Chloride: 110 mmol/L (ref 98–111)
Creatinine, Ser: 0.87 mg/dL (ref 0.44–1.00)
GFR calc Af Amer: >60 mL/min (ref >60)
GFR calc non Af Amer: >60 mL/min (ref >60)
Glucose, Bld: 99 mg/dL (ref 70–99)
Potassium: 4 mmol/L (ref 3.5–5.1)
Sodium: 140 mmol/L (ref 135–145)

## 2019-12-03 LAB — PROTIME-INR
INR: 0.9 (ref 0.8–1.2)
Prothrombin Time: 12.5 seconds (ref 11.4–15.2)

## 2019-12-03 LAB — HCG, SERUM, QUALITATIVE: Preg, Serum: NEGATIVE

## 2019-12-03 MED ORDER — ONDANSETRON HCL 4 MG/2ML IJ SOLN: 4.0000 mg | Freq: Four times a day (QID) | INTRAMUSCULAR | Status: DC | PRN

## 2019-12-03 MED ORDER — PROMETHAZINE HCL 25 MG RE SUPP: 25.0000 mg | Freq: Three times a day (TID) | RECTAL | Status: DC | PRN

## 2019-12-03 MED ORDER — SODIUM CHLORIDE 0.9% FLUSH: 3.0000 mL | Freq: Two times a day (BID) | INTRAVENOUS | Status: DC

## 2019-12-03 MED ORDER — IOHEXOL 300 MG/ML  SOLN
100.0000 mL | Freq: Once | INTRAMUSCULAR | Status: AC | PRN
  Administered 2019-12-03: 75 mL via INTRA_ARTERIAL

## 2019-12-03 MED ORDER — HYDROMORPHONE 1 MG/ML IV SOLN
INTRAVENOUS | Status: DC
  Administered 2019-12-03: 3.6 mg via INTRAVENOUS
  Administered 2019-12-03: 30 mg via INTRAVENOUS
  Administered 2019-12-04: 0 mg via INTRAVENOUS
  Administered 2019-12-04: 0.6 mg via INTRAVENOUS
  Administered 2019-12-04: 1.5 mg via INTRAVENOUS

## 2019-12-03 MED ORDER — RIVAROXABAN 20 MG PO TABS
20.0000 mg | ORAL_TABLET | Freq: Every day | ORAL | Status: DC
  Administered 2019-12-03: 20 mg via ORAL
  Filled 2019-12-03: qty 1

## 2019-12-03 MED ORDER — MIDAZOLAM HCL 2 MG/2ML IJ SOLN
INTRAMUSCULAR | Status: AC
  Filled 2019-12-03: qty 6

## 2019-12-03 MED ORDER — CEFAZOLIN SODIUM-DEXTROSE 2-4 GM/100ML-% IV SOLN: 2.0000 g | INTRAVENOUS | Status: AC

## 2019-12-03 MED ORDER — FENTANYL CITRATE (PF) 100 MCG/2ML IJ SOLN
INTRAMUSCULAR | Status: AC | PRN
  Administered 2019-12-03 (×2): 50 ug via INTRAVENOUS

## 2019-12-03 MED ORDER — SODIUM CHLORIDE 0.9 % IV SOLN: INTRAVENOUS | Status: DC

## 2019-12-03 MED ORDER — DIPHENHYDRAMINE HCL 50 MG/ML IJ SOLN: 12.5000 mg | Freq: Four times a day (QID) | INTRAMUSCULAR | Status: DC | PRN

## 2019-12-03 MED ORDER — HYDROCODONE-ACETAMINOPHEN 5-325 MG PO TABS: 1.0000 | ORAL_TABLET | ORAL | Status: DC | PRN

## 2019-12-03 MED ORDER — MIDAZOLAM HCL 2 MG/2ML IJ SOLN
INTRAMUSCULAR | Status: AC | PRN
  Administered 2019-12-03 (×3): 1 mg via INTRAVENOUS

## 2019-12-03 MED ORDER — IOHEXOL 300 MG/ML  SOLN
100.0000 mL | Freq: Once | INTRAMUSCULAR | Status: AC | PRN
  Administered 2019-12-03: 90 mL via INTRA_ARTERIAL

## 2019-12-03 MED ORDER — KETOROLAC TROMETHAMINE 30 MG/ML IJ SOLN
30.0000 mg | INTRAMUSCULAR | Status: AC
  Filled 2019-12-03: qty 1

## 2019-12-03 MED ORDER — SODIUM CHLORIDE 0.9% FLUSH: 3.0000 mL | INTRAVENOUS | Status: DC | PRN

## 2019-12-03 MED ORDER — DEXAMETHASONE 4 MG PO TABS
8.0000 mg | ORAL_TABLET | ORAL | Status: AC
  Administered 2019-12-03: 8 mg via ORAL
  Filled 2019-12-03: qty 2

## 2019-12-03 MED ORDER — NALOXONE HCL 0.4 MG/ML IJ SOLN: 0.4000 mg | INTRAMUSCULAR | Status: DC | PRN

## 2019-12-03 MED ORDER — LIDOCAINE HCL (PF) 1 % IJ SOLN
INTRAMUSCULAR | Status: AC | PRN
  Administered 2019-12-03 (×2): 5 mL

## 2019-12-03 MED ORDER — SODIUM CHLORIDE 0.9 % IV SOLN: 250.0000 mL | INTRAVENOUS | Status: DC | PRN

## 2019-12-03 MED ORDER — IOHEXOL 300 MG/ML  SOLN
100.0000 mL | Freq: Once | INTRAMUSCULAR | Status: AC | PRN
  Administered 2019-12-03: 12 mL via INTRA_ARTERIAL

## 2019-12-03 MED ORDER — LIDOCAINE HCL 1 % IJ SOLN
INTRAMUSCULAR | Status: AC
  Filled 2019-12-03: qty 20

## 2019-12-03 MED ORDER — DIPHENHYDRAMINE HCL 12.5 MG/5ML PO ELIX
12.5000 mg | ORAL_SOLUTION | Freq: Four times a day (QID) | ORAL | Status: DC | PRN
  Filled 2019-12-03: qty 5

## 2019-12-03 MED ORDER — KETOROLAC TROMETHAMINE 30 MG/ML IJ SOLN
INTRAMUSCULAR | Status: AC
  Administered 2019-12-03: 30 mg via INTRAVENOUS
  Filled 2019-12-03: qty 1

## 2019-12-03 MED ORDER — PROMETHAZINE HCL 25 MG PO TABS: 25.0000 mg | ORAL_TABLET | Freq: Three times a day (TID) | ORAL | Status: DC | PRN

## 2019-12-03 MED ORDER — DOCUSATE SODIUM 100 MG PO CAPS
100.0000 mg | ORAL_CAPSULE | Freq: Two times a day (BID) | ORAL | Status: DC
  Administered 2019-12-04 (×2): 100 mg via ORAL
  Filled 2019-12-03 (×2): qty 1

## 2019-12-03 MED ORDER — KETOROLAC TROMETHAMINE 30 MG/ML IJ SOLN
30.0000 mg | Freq: Four times a day (QID) | INTRAMUSCULAR | Status: DC
  Administered 2019-12-03 – 2019-12-04 (×3): 30 mg via INTRAVENOUS
  Filled 2019-12-03 (×3): qty 1

## 2019-12-03 MED ORDER — CEFAZOLIN SODIUM-DEXTROSE 2-4 GM/100ML-% IV SOLN
INTRAVENOUS | Status: AC
  Administered 2019-12-03: 2 g via INTRAVENOUS
  Filled 2019-12-03: qty 100

## 2019-12-03 MED ORDER — SODIUM CHLORIDE 0.9% FLUSH: 9.0000 mL | INTRAVENOUS | Status: DC | PRN

## 2019-12-03 MED ORDER — FENTANYL CITRATE (PF) 100 MCG/2ML IJ SOLN
INTRAMUSCULAR | Status: AC
  Filled 2019-12-03: qty 4

## 2019-12-03 NOTE — H&P (Addendum)
Referring Physician(s): Anyanwu,U  Supervising Physician: Daryll Brod  Patient Status:  [facility]  TBA  Chief Complaint: Symptomatic uterine fibroids   Subjective: Patient familiar to IR service from previous consultations on 10/10/2017 as well as follow-up visits on 08/07/2019 and 10/15/2019 to discuss treatment options for symptomatic uterine fibroids/adenomyosis. Patient has also undergone prior left transgluteal/pelvic/tubo-ovarian abscess drain placements on 11/17/2017 and 11/21/2017.  Drains were subsequently removed on 11/22/2017 and 12/26/2017.  She has a chronic history of abnormal menstrual bleeding related to an enlarged uterus and predominantly secondary to a posterior adenomyoma and diffuse uterine adenomyosis.  She does have a few small fibroids largest measuring 2.4 cm.  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  Due to her multiple comorbidities including chronic anemia, antiphospholipid syndrome, chronic anticoagulation for recurrent LE DVT's and morbid obesity she is high risk for surgery.  Following discussions with Dr. Annamaria Boots she presents today for uterine artery embolization to treat progressive adenomyosis.  She currently denies fever, headache, chest pain, dyspnea, cough, abdominal/back pain, nausea, vomiting.   Past Medical History:  Diagnosis Date  . Anemia 09/2017   REQUIRING A TRANSFUSION  . Antiphospholipid syndrome ([place])   . Breast cyst    BILATERAL  . DVT (deep venous thrombosis) ([place])   . Dysfunctional uterine bleeding   . Fibroids   . Obesity    Past Surgical History:  Procedure Laterality Date  . CESAREAN SECTION    . DILITATION & CURRETTAGE/HYSTROSCOPY WITH HYDROTHERMAL ABLATION N/A 10/08/2014   Procedure: DILATATION & CURETTAGE/HYSTEROSCOPY WITH HYDROTHERMAL ABLATION;  Surgeon: Osborne Oman, MD;  Location: [facility];  Service: Gynecology;  Laterality: N/A;  .  IR RADIOLOGIST EVAL & MGMT  10/10/2017  . IR RADIOLOGIST EVAL & MGMT  11/28/2017  . IR RADIOLOGIST EVAL & MGMT  12/12/2017  . IR RADIOLOGIST EVAL & MGMT  08/07/2019  . IR RADIOLOGIST EVAL & MGMT  10/15/2019  . IR SINUS/FIST TUBE CHK-NON GI  12/26/2017  . MYOMECTOMY        Allergies: Patient has no known allergies.  Medications: Prior to Admission medications   Medication Sig Start Date End Date Taking? Authorizing Provider  leuprolide (LUPRON DEPOT, 19-MONTH,) 11.25 MG injection Inject 11.25 mg into the muscle every 3 (three) months. 03/20/19   Anyanwu, Sallyanne Havers, MD  norethindrone (AYGESTIN) 5 MG tablet Take one tablet 22m once per day until bleeding stops. Once bleeding stops can go back 10 mg. Please scheduled appointment. 06/19/19   Anyanwu, USallyanne Havers MD  XARELTO 20 MG TABS tablet TAKE 1 TABLET (20 MG TOTAL) BY MOUTH DAILY WITH SUPPER. 09/15/19   LVenia Carbon MD     Vital Signs: Blood pressure 134/89, temperature 98.7, heart rate 90, respirations 15, O2 sat 99% room air   Physical Exam awake, alert.  Chest clear to auscultation bilaterally.  Heart with regular rate and rhythm.  Abdomen obese, soft, positive bowel sounds, nontender.  Trace pretibial edema bilaterally.  Some venous stasis changes of skin noted left lower extremity  Imaging: No results found.  Labs:  CBC: Recent Labs    02/14/19 1110 08/25/19 1035  WBC 3.6* 4.7  HGB 14.2 11.0*  HCT 44.5 36.6  PLT 256 327    COAGS: No results for input(s): INR, APTT in the last 8760 hours.  BMP: No results for input(s): NA, K, CL, CO2, GLUCOSE, BUN, CALCIUM, CREATININE, GFRNONAA, GFRAA in the last  8760 hours.  Invalid input(s): CMP  LIVER FUNCTION TESTS: No results for input(s): BILITOT, AST, ALT, ALKPHOS, PROT, ALBUMIN in the last 8760 hours.  Assessment and Plan: 44 yo female with prior history of left tubo-ovarian/pelvic abscess drainage x2 in 2019 (drains since removed) as well as chronic history of abnormal  menstrual bleeding related to an enlarged uterus and predominantly secondary to a posterior adenomyoma and diffuse uterine adenomyosis.  She does have a few small fibroids largest measuring 2.4 cm.  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  Due to her multiple comorbidities including chronic anemia, antiphospholipid syndrome, chronic anticoagulation for recurrent LE DVT's and morbid obesity she is high risk for surgery.  Following discussions with Dr. Annamaria Boots she presents today for uterine artery embolization to treat progressive adenomyosis. Risks and benefits of procedure were discussed with the patient including, but not limited to bleeding, infection, vascular injury or contrast induced renal failure.  This interventional procedure involves the use of X-rays and because of the nature of the planned procedure, it is possible that we will have prolonged use of X-ray fluoroscopy.  Potential radiation risks to you include (but are not limited to) the following: - A slightly elevated risk for cancer  several years later in life. This risk is typically less than 0.5% percent. This risk is low in comparison to the normal incidence of human cancer, which is 33% for women and 50% for men according to the [employer]. - Radiation induced injury can include skin redness, resembling a rash, tissue breakdown / ulcers and hair loss (which can be temporary or permanent).   The likelihood of either of these occurring depends on the difficulty of the procedure and whether you are sensitive to radiation due to previous procedures, disease, or genetic conditions.   IF your procedure requires a prolonged use of radiation, you will be notified and given written instructions for further action.  It is your responsibility to monitor the irradiated area for the 2 weeks following the procedure and to notify your  physician if you are concerned that you have suffered a radiation induced injury.    All of the patient's questions were answered, patient is agreeable to proceed.  Consent signed and in chart. COVID-19 neg. Post procedure she will be admitted to the hospital for overnight observation for pain control.      Electronically Signed: D. Rowe Robert, PA-C 12/03/2019, 12:16 PM   I spent a total of 30 minutes at the the patient's bedside AND on the patient's hospital floor or unit, greater than 50% of which was counseling/coordinating care for bilateral uterine artery embolization

## 2019-12-03 NOTE — Procedures (Signed)
Interventional Radiology Procedure Note  Procedure: UFE   Complications: None  Estimated Blood Loss: MIN  Findings: Successful bilateral [place] for adenomyosis/adenomyoma predominately and a few small fibroids

## 2019-12-04 DIAGNOSIS — N800 Endometriosis of uterus: Secondary | ICD-10-CM | POA: Diagnosis not present

## 2019-12-04 MED ORDER — CHLORHEXIDINE GLUCONATE CLOTH 2 % EX PADS: 6.0000 | MEDICATED_PAD | Freq: Every day | CUTANEOUS | Status: DC

## 2019-12-04 MED ORDER — DOCUSATE SODIUM 100 MG PO CAPS: 100.0000 mg | ORAL_CAPSULE | Freq: Two times a day (BID) | ORAL | 0 refills | Status: DC

## 2019-12-04 MED ORDER — PROMETHAZINE HCL 25 MG PO TABS: 25.0000 mg | ORAL_TABLET | Freq: Three times a day (TID) | ORAL | 0 refills | Status: DC | PRN

## 2019-12-04 MED ORDER — HYDROCODONE-ACETAMINOPHEN 5-325 MG PO TABS: 1.0000 | ORAL_TABLET | ORAL | 0 refills | Status: DC | PRN

## 2019-12-04 NOTE — Progress Notes (Signed)
RN discontinued PCA Dilaudid per orders. Wasted 53m into stericycle. Waste was witnessed by SHubbard Robinson RTherapist, sports

## 2019-12-04 NOTE — Discharge Instructions (Signed)
Uterine Artery Embolization for Fibroids/Adenomyosis, Care After This sheet gives you information about how to care for yourself after your procedure. Your health care provider may also give you more specific instructions. If you have problems or questions, contact your health care provider. What can I expect after the procedure? After your procedure, it is common to have:  Pelvic cramping. You will be given pain medicine.  Nausea and vomiting. You may be given medicine to help relieve nausea. Follow these instructions at home: Incision care  Follow instructions from your health care provider about how to take care of your incision. Make sure you: ? Wash your hands with soap and water before you change your bandage (dressing). If soap and water are not available, use hand sanitizer. ? Change your dressing as told by your health care provider.  Check your incision area every day for signs of infection. Check for: ? More redness, swelling, or pain. ? More fluid or blood. ? Warmth. ? Pus or a bad smell. Medicines   Take over-the-counter and prescription medicines only as told by your health care provider.  Do not take aspirin. It can cause bleeding.  Do not drive for 24 hours if you were given a medicine to help you relax (sedative).  Do not drive or use heavy machinery while taking prescription pain medicine. General instructions  Ask your health care provider when you can resume sexual activity.  To prevent or treat constipation while you are taking prescription pain medicine, your health care provider may recommend that you: ? Drink enough fluid to keep your urine clear or pale yellow. ? Take over-the-counter or prescription medicines. ? Eat foods that are high in fiber, such as fresh fruits and vegetables, whole grains, and beans. ? Limit foods that are high in fat and processed sugars, such as fried and sweet foods. Contact a health care provider if:  You have a  fever.  You have more redness, swelling, or pain around your incision site.  You have more fluid or blood coming from your incision site.  Your incision feels warm to the touch.  You have pus or a bad smell coming from your incision.  You have a rash.  You have uncontrolled nausea or you cannot eat or drink anything without vomiting. Get help right away if:  You have trouble breathing.  You have chest pain.  You have severe abdominal pain.  You have leg pain.  You become dizzy and faint. Summary  After your procedure, it is common to have pelvic cramping. You will be given pain medicine.  Follow instructions from your health care provider about how to take care of your incision.  Check your incision area every day for signs of infection.  Take over-the-counter and prescription medicines only as told by your health care provider. This information is not intended to replace advice given to you by your health care provider. Make sure you discuss any questions you have with your health care provider. Document Revised: 07/27/2017 Document Reviewed: 11/16/2016 Elsevier Patient Education  2020 [employer].

## 2019-12-04 NOTE — Progress Notes (Signed)
Pt is being discharged home. Discharge instructions including follow up appointments and medications given. Pt had no further questions at this time.

## 2019-12-04 NOTE — Progress Notes (Signed)
Rolled pt side to side to remove excess bed linens, sm amt vaginal bleeding noted.

## 2019-12-04 NOTE — Discharge Summary (Signed)
Patient ID: [name] MRN: [id number] DOB/AGE: [birth year] 44 y.o.  Admit date: 12/03/2019 Discharge date: 12/04/2019  Supervising Physician: Daryll Brod  Patient Status: [facility] - In-pt  Admission Diagnoses: Abnormal uterine bleeding/adenomyosis/adenomyoma  Discharge Diagnoses: Abnormal uterine bleeding/adenomyosis/adenomyoma, status post bilateral uterine artery embolization via  IV conscious sedation on 12/03/2019 Active Problems:   Uterine bleeding, dysfunctional  Past Medical History:  Diagnosis Date  . Anemia 09/2017   REQUIRING A TRANSFUSION  . Antiphospholipid syndrome ([place])   . Breast cyst    BILATERAL  . DVT (deep venous thrombosis) ([place])   . Dysfunctional uterine bleeding   . Fibroids   . Obesity    Past Surgical History:  Procedure Laterality Date  . CESAREAN SECTION    . DILITATION & CURRETTAGE/HYSTROSCOPY WITH HYDROTHERMAL ABLATION N/A 10/08/2014   Procedure: DILATATION & CURETTAGE/HYSTEROSCOPY WITH HYDROTHERMAL ABLATION;  Surgeon: Osborne Oman, MD;  Location: [facility];  Service: Gynecology;  Laterality: N/A;  . IR ANGIOGRAM PELVIS SELECTIVE OR SUPRASELECTIVE  12/03/2019  . IR ANGIOGRAM PELVIS SELECTIVE OR SUPRASELECTIVE  12/03/2019  . IR ANGIOGRAM SELECTIVE EACH ADDITIONAL VESSEL  12/03/2019  . IR ANGIOGRAM SELECTIVE EACH ADDITIONAL VESSEL  12/03/2019  . IR EMBO TUMOR ORGAN ISCHEMIA INFARCT INC GUIDE ROADMAPPING  12/03/2019  . IR RADIOLOGIST EVAL & MGMT  10/10/2017  . IR RADIOLOGIST EVAL & MGMT  11/28/2017  . IR RADIOLOGIST EVAL & MGMT  12/12/2017  . IR RADIOLOGIST EVAL & MGMT  08/07/2019  . IR RADIOLOGIST EVAL & MGMT  10/15/2019  . IR SINUS/FIST TUBE CHK-NON GI  12/26/2017  . IR US GUIDE VASC ACCESS LEFT  12/03/2019  . IR US GUIDE VASC ACCESS RIGHT  12/03/2019  . MYOMECTOMY       Discharged Condition:  good  Hospital Course: [name] is a 44 yo female with prior history of left tubo-ovarian/pelvic abscess drainage x2 in 2019 (drains since removed) as  well as chronic history of abnormal menstrual bleeding related to an enlarged uterus and predominantly secondary to a posterior adenomyoma and diffuse uterine adenomyosis.  She does have a few small fibroids largest measuring 2.4 cm.  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  Due to her multiple comorbidities including chronic anemia, antiphospholipid syndrome, chronic anticoagulation for recurrent LE DVT's and morbid obesity she is high risk for surgery.  Following discussions with Dr. Annamaria Boots she was deemed an appropriate candidate for bilateral uterine artery embolization  to treat progressive adenomyosis and underwent the procedure at [facility] on 12/03/2019.  The procedure was performed without immediate complications and she was subsequently admitted to the hospital for overnight observation for pain control.  She was placed on Dilaudid PCA pump.  Overnight the patient did well with some expected pelvic cramping.  On the morning of discharge she was stable.  She was able to tolerate her diet, ambulate and void without significant difficulty.  She only complained of some minimal pelvic cramping.  Findings were discussed with Dr. Annamaria Boots and she was deemed stable for discharge at this time.  She will continue her current home medications.  Electronic prescriptions were submitted for Norco, Colace and Phenergan.  She will follow-up with Dr. Annamaria Boots ,likely virtual visit,  in the [place] clinic in 3 to 4 weeks.  She will continue her current gynecological care with Dr. Harolyn Rutherford.  She was told to contact our service with any additional questions or concerns.  Consults: none  Significant Diagnostic Studies:  Results for orders placed or performed during the hospital encounter of 98/33/82  Basic metabolic panel  Result Value Ref Range   Sodium 140 135 - 145 mmol/L   Potassium 4.0 3.5 - 5.1 mmol/L   Chloride 110 98  - 111 mmol/L   CO2 22 22 - 32 mmol/L   Glucose, Bld 99 70 - 99 mg/dL   BUN 10 6 - 20 mg/dL   Creatinine, Ser 0.87 0.44 - 1.00 mg/dL   Calcium 9.5 8.9 - 10.3 mg/dL   GFR calc non Af Amer >60 >60 mL/min   GFR calc Af Amer >60 >60 mL/min   Anion gap 8 5 - 15  CBC with Differential/Platelet  Result Value Ref Range   WBC 4.4 4.0 - 10.5 K/uL   RBC 5.95 (H) 3.87 - 5.11 MIL/uL   Hemoglobin 14.0 12.0 - 15.0 g/dL   HCT 46.2 (H) 36.0 - 46.0 %   MCV 77.6 (L) 80.0 - 100.0 fL   MCH 23.5 (L) 26.0 - 34.0 pg   MCHC 30.3 30.0 - 36.0 g/dL   RDW 17.9 (H) 11.5 - 15.5 %   Platelets 279 150 - 400 K/uL   nRBC 0.0 0.0 - 0.2 %   Neutrophils Relative % 48 %   Neutro Abs 2.2 1.7 - 7.7 K/uL   Lymphocytes Relative 36 %   Lymphs Abs 1.6 0.7 - 4.0 K/uL   Monocytes Relative 10 %   Monocytes Absolute 0.4 0.1 - 1.0 K/uL   Eosinophils Relative 4 %   Eosinophils Absolute 0.2 0.0 - 0.5 K/uL   Basophils Relative 1 %   Basophils Absolute 0.0 0.0 - 0.1 K/uL   Immature Granulocytes 1 %   Abs Immature Granulocytes 0.02 0.00 - 0.07 K/uL  hCG, serum, qualitative  Result Value Ref Range   Preg, Serum NEGATIVE NEGATIVE  Protime-INR  Result Value Ref Range   Prothrombin Time 12.5 11.4 - 15.2 seconds   INR 0.9 0.8 - 1.2     Treatments: Technically successful bilateral uterine artery embolization to treat uterine adenomyosis/adenomyoma via IV conscious sedation on 12/03/19  Discharge Exam: Blood pressure (!) 150/90, pulse 96, temperature 97.7 F (36.5 C), temperature source Oral, resp. rate (!) 22, last menstrual period 10/05/2019, SpO2 97 %. Awake, alert.  Chest clear to auscultation bilaterally.  Heart with regular rate and rhythm.  Abdomen soft, positive bowel sounds, currently nontender.  Puncture sites right and left common femoral artery soft, clean, dry, intact dressing, no discrete hematoma.  Extremities with full range of motion, intact distal pulses.  No significant edema.  Chronic venous stasis changes left  lower extremity.  Disposition: Discharge disposition: 01-Home or Self Care       Discharge Instructions    Call MD for:  difficulty breathing, headache or visual disturbances   Complete by: As directed    Call MD for:  extreme fatigue   Complete by: As directed    Call MD for:  hives   Complete by: As directed    Call MD for:  persistant dizziness or light-headedness   Complete by: As directed    Call MD for:  persistant nausea and vomiting   Complete by: As directed    Call MD for:  redness, tenderness, or signs of infection (pain, swelling, redness, odor or green/yellow discharge around incision site)   Complete by: As directed    Call MD for:  severe uncontrolled pain   Complete by: As  directed    Call MD for:  temperature >100.4   Complete by: As directed    Diet - low sodium heart healthy   Complete by: As directed    Discharge instructions   Complete by: As directed    Stay well-hydrated; contact radiology at [phone] or [phone] with any questions   Driving Restrictions   Complete by: As directed    No driving for 24 hours   Increase activity slowly   Complete by: As directed    Lifting restrictions   Complete by: As directed    No heavy lifting for the next 3 to 4 days   May shower / Bathe   Complete by: As directed    May walk up steps   Complete by: As directed    Sexual Activity Restrictions   Complete by: As directed    No sexual intercourse for 1 week     Allergies as of 12/04/2019   No Known Allergies     Medication List    STOP taking these medications   Lupron Depot (75-Month) 11.25 MG injection Generic drug: leuprolide     TAKE these medications   docusate sodium 100 MG capsule Commonly known as: COLACE Take 1 capsule (100 mg total) by mouth 2 (two) times daily.   HYDROcodone-acetaminophen 5-325 MG tablet Commonly known as: NORCO/VICODIN Take 1-2 tablets by mouth every 4 (four) hours as needed for moderate pain.   norethindrone  5 MG tablet Commonly known as: AYGESTIN Take one tablet 93m once per day until bleeding stops. Once bleeding stops can go back 10 mg. Please scheduled appointment.   promethazine 25 MG tablet Commonly known as: PHENERGAN Take 1 tablet (25 mg total) by mouth every 8 (eight) hours as needed for nausea.   Xarelto 20 MG Tabs tablet Generic drug: rivaroxaban TAKE 1 TABLET (20 MG TOTAL) BY MOUTH DAILY WITH SUPPER.      Follow-up Information    Anyanwu, USallyanne Havers MD Follow up.   Specialty: Obstetrics and Gynecology Why: Follow-up with Dr. AHarolyn Rutherfordas scheduled Contact information: [address] [phone]       SGreggory Keen MD Follow up.   Specialties: Interventional Radiology, Radiology Why: Radiology will contact you regarding follow-up likely virtual appointment with Dr. SAnnamaria Bootsin 3 to 4 weeks; call [phone] [phone] any questions Contact information: [address] [place] [phone]           Electronically Signed: D. KRowe Robert PA-C 12/04/2019, 11:34 AM   I have spent Less Than 30 Minutes discharging [employer]

## 2019-12-08 IMAGING — US US PELVIS COMPLETE TRANSABD/TRANSVAG
1 series · 15 of 25 positions shown · non-contrast
Comparison: CT of the abdomen and pelvis 08/02/2017 ; ultrasound of
the pelvis on 08/18/2015

CLINICAL DATA: Abnormal uterine bleeding. Fibroids. Long-term
anticoagulation use. History of myomectomy, D & Celsius,
C-section.



[Series 1: us pelvis complete transabd/transvag · 15 of 65 slices shown]
[im 1/65]
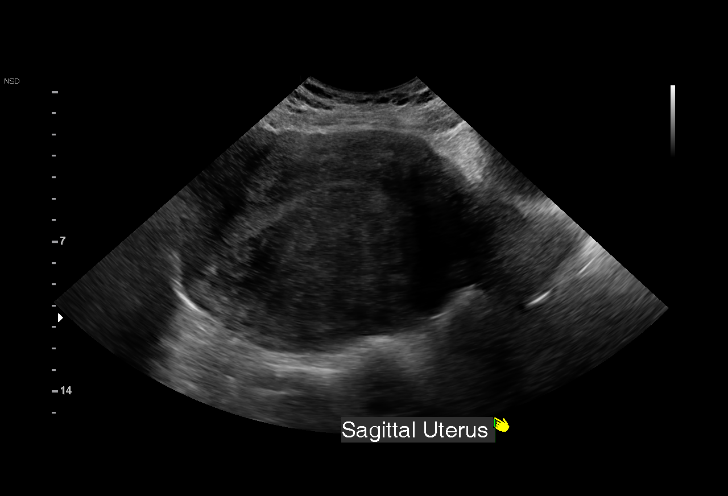
[im 6/65]
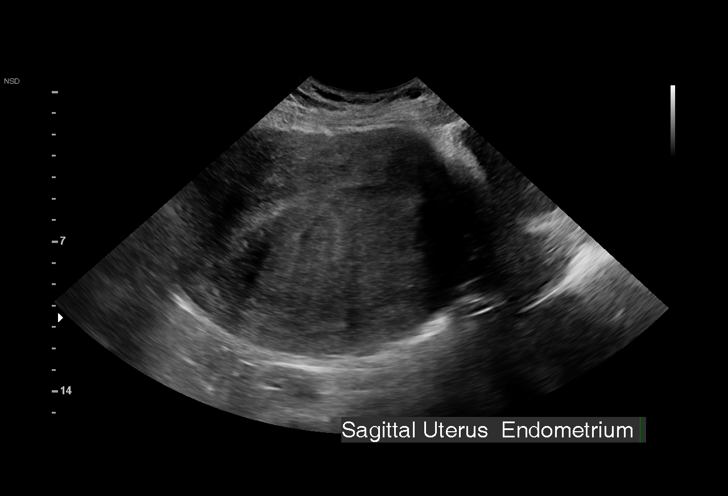
[im 11/65]
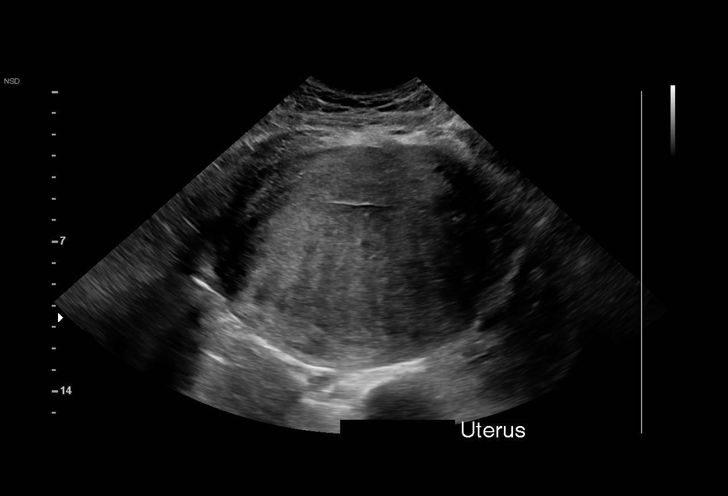
[im 14/65]
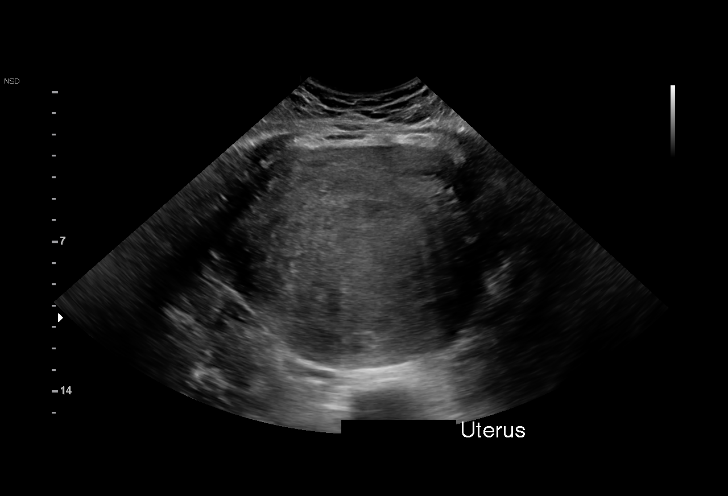
[im 19/65]
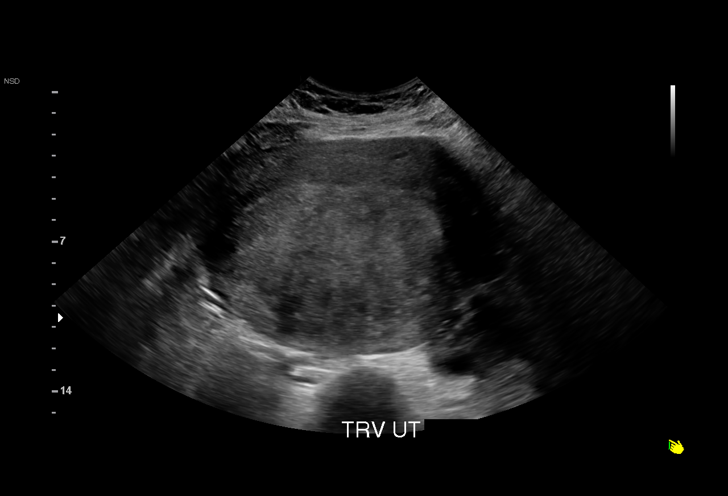
[im 25/65]
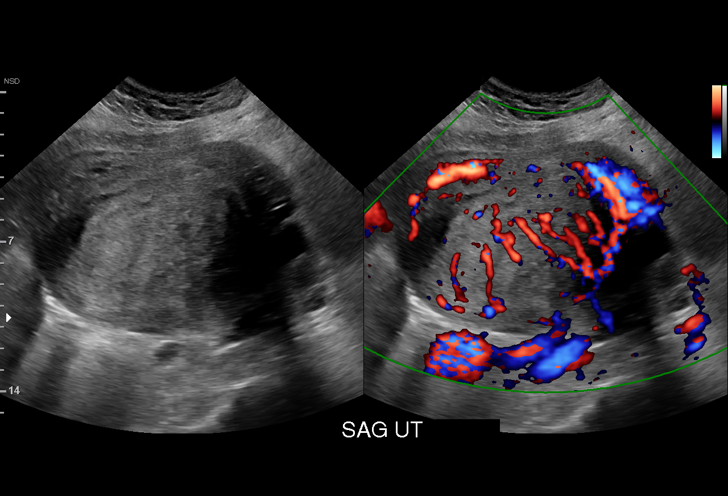
[im 27/65]
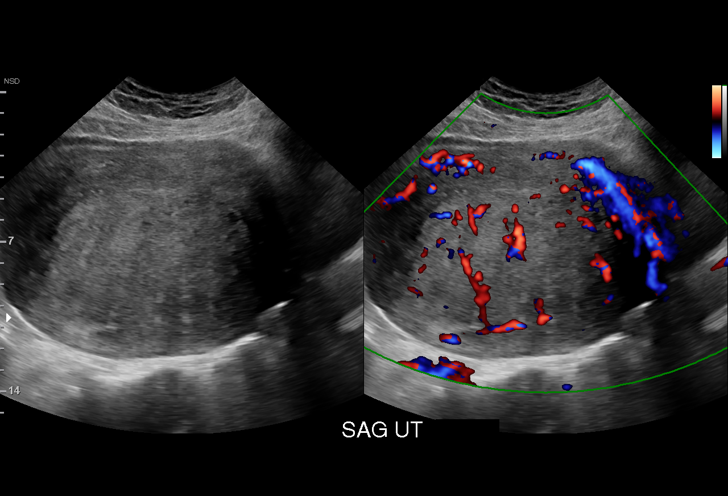
[im 33/65]
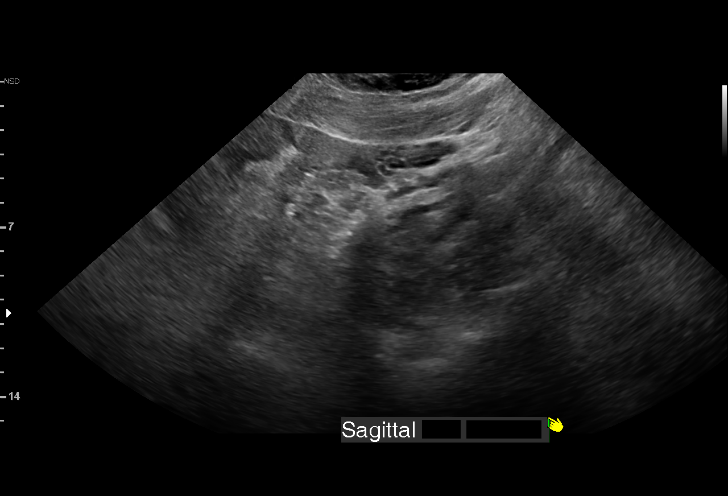
[im 38/65]
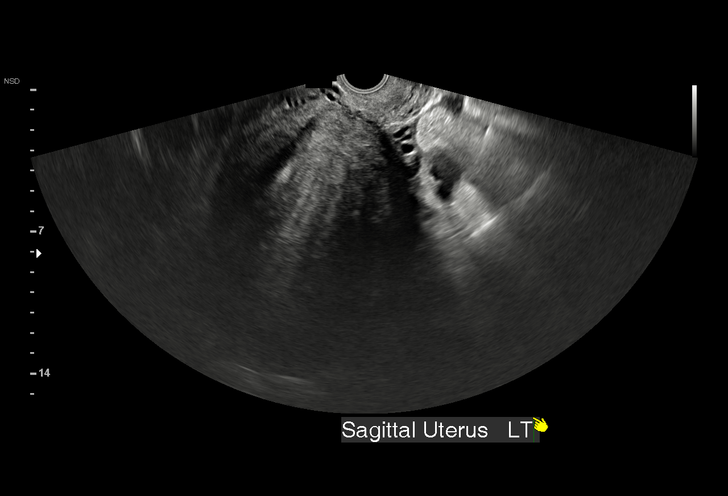
[im 41/65]
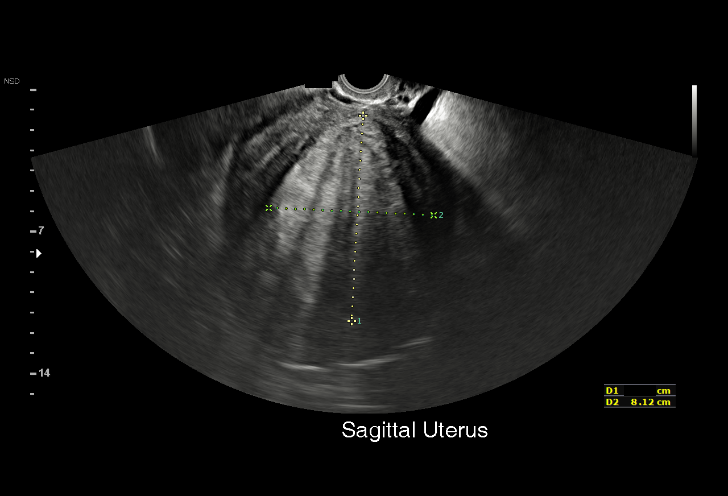
[im 46/65]
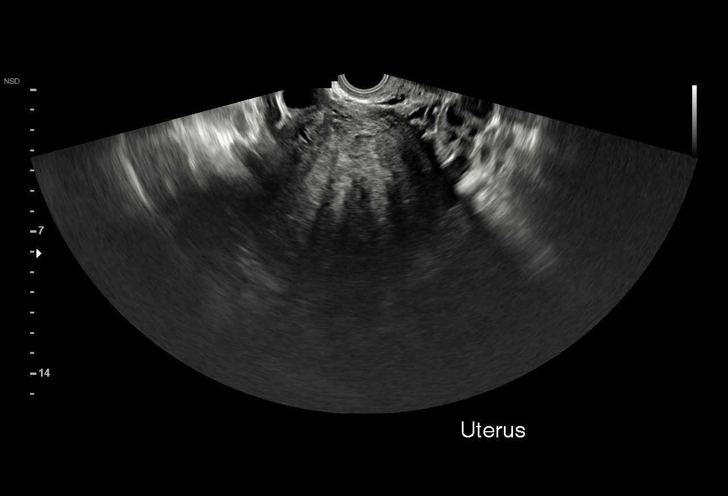
[im 51/65]
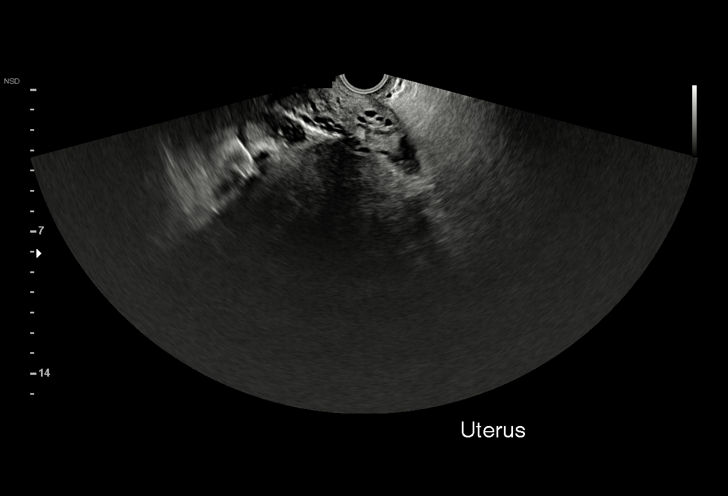
[im 54/65]
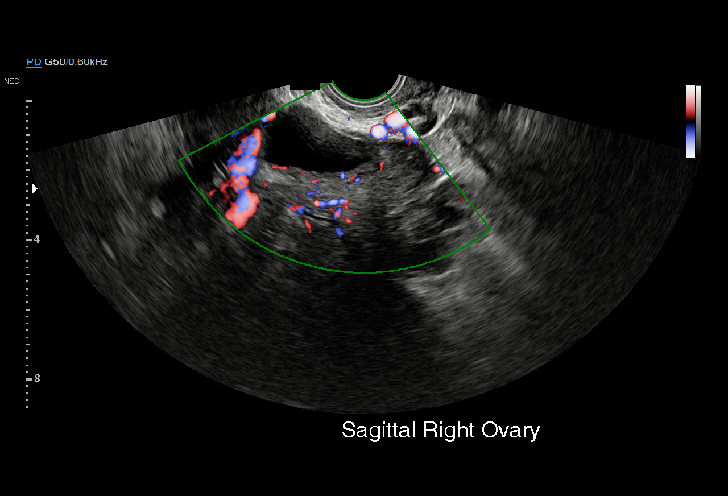
[im 59/65]
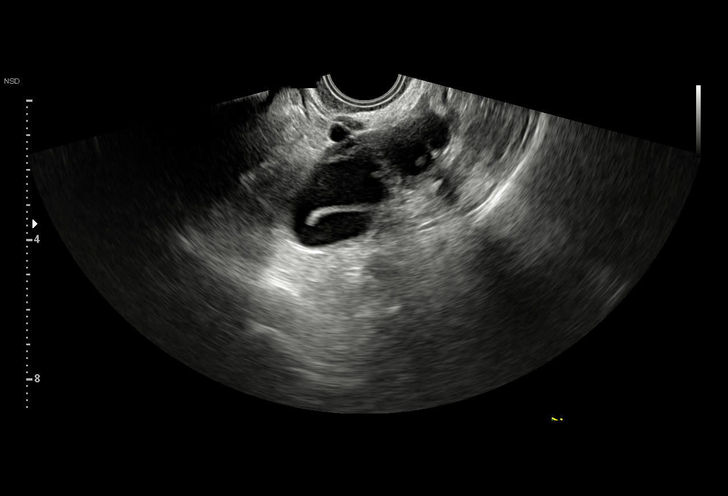
[im 65/65]
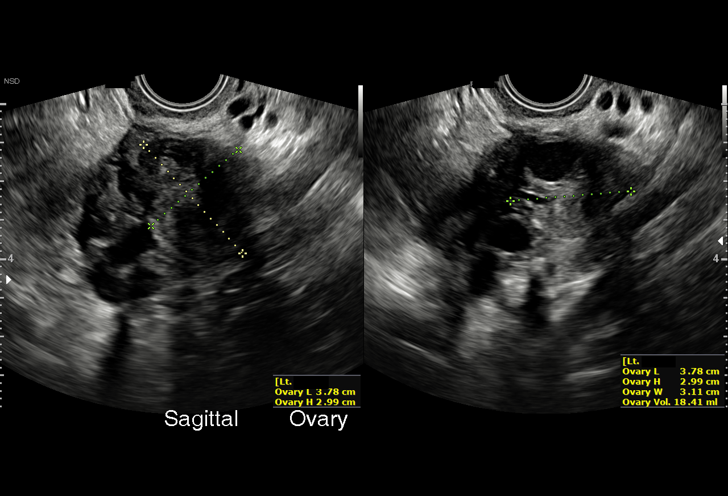

[15 of 25 positions shown; findings below may reference images not displayed]

FINDINGS: Uterus

Measurements: At least 19.1 x 11.0 x 13.8 cm. Heterogeneous oval
mass is identified in the posterior aspect of the uterus measuring
10.5 x 7.1 x 10.9 cm, consistent with large fibroid. In 8018, this
fibroid measured 5.6 x 4.6 x 5.9 cm.

Endometrium

Thickness: 12.3 mm. Displaced by a large posterior fibroid but
otherwise normal in appearance.

Right ovary

Measurements: 5.1 x 2.1 x 2.8 cm. Normal appearance/no adnexal mass.

Left ovary

Measurements: 3.8 x 3.0 x 3.1 cm. Ovary is normal in appearance.
Adjacent to the ovary there is a tubular structure possibly
representing hydrosalpinx.

Other findings

No abnormal free fluid.
IMPRESSION: 1. Enlarged uterus containing large posterior fibroid which
displaces the endometrium anteriorly.
2. The endometrium is normal in thickness.
3. Suspect left adnexal hydrosalpinx. The ovaries are normal in
appearance.

## 2020-02-06 ENCOUNTER — Ambulatory Visit (INDEPENDENT_AMBULATORY_CARE_PROVIDER_SITE_OTHER): Payer: BC Managed Care – PPO | Admitting: Internal Medicine

## 2020-02-06 ENCOUNTER — Other Ambulatory Visit: Payer: Self-pay

## 2020-02-06 ENCOUNTER — Encounter: Payer: Self-pay | Admitting: Internal Medicine

## 2020-02-06 VITALS — BP 108/80 | HR 88 | Temp 97.9°F | Ht 69.0 in | Wt 237.0 lb

## 2020-02-06 DIAGNOSIS — I82409 Acute embolism and thrombosis of unspecified deep veins of unspecified lower extremity: Secondary | ICD-10-CM | POA: Diagnosis not present

## 2020-02-06 DIAGNOSIS — Z1211 Encounter for screening for malignant neoplasm of colon: Secondary | ICD-10-CM | POA: Diagnosis not present

## 2020-02-06 DIAGNOSIS — Z0000 Encounter for general adult medical examination without abnormal findings: Secondary | ICD-10-CM

## 2020-02-06 DIAGNOSIS — N938 Other specified abnormal uterine and vaginal bleeding: Secondary | ICD-10-CM | POA: Diagnosis not present

## 2020-02-06 NOTE — Assessment & Plan Note (Signed)
Better now since uterine artery embolization

## 2020-02-06 NOTE — Assessment & Plan Note (Signed)
Needs xarelto permanently

## 2020-02-06 NOTE — Assessment & Plan Note (Signed)
Generally healthy Discussed increasing exercise Recent mammogram Pap not due yet Will do FIT Consider flu vaccine in fall

## 2020-02-06 NOTE — Progress Notes (Signed)
Subjective:    Patient ID: [name], female    DOB: 12-10-1975, 44 y.o.   MRN: [id number]  HPI Here for physical This visit occurred during the SARS-CoV-2 public health emergency.  Safety protocols were in place, including screening questions prior to the visit, additional usage of staff PPE, and extensive cleaning of exam room while observing appropriate contact time as indicated for disinfecting solutions.   Finally had the uterine artery embolization in April Worked well Only with slight spotting since then No abdominal pain  Ongoing stasis changes in left leg Ulcer has healed Does use support hose when she can  Current Outpatient Medications on File Prior to Visit  Medication Sig Dispense Refill  . norethindrone (AYGESTIN) 5 MG tablet Take one tablet 67m once per day until bleeding stops. Once bleeding stops can go back 10 mg. Please scheduled appointment. 180 tablet 5  . XARELTO 20 MG TABS tablet TAKE 1 TABLET (20 MG TOTAL) BY MOUTH DAILY WITH SUPPER. 90 tablet 3   Current Facility-Administered Medications on File Prior to Visit  Medication Dose Route Frequency Provider Last Rate Last Admin  . oxyCODONE-acetaminophen (PERCOCET/ROXICET) 5-325 MG per tablet 2 tablet  2 tablet Oral Once THarle Stanford, PA-C        No Known Allergies  Past Medical History:  Diagnosis Date  . Anemia 09/2017   REQUIRING A TRANSFUSION  . Antiphospholipid syndrome ([place]   . Breast cyst    BILATERAL  . DVT (deep venous thrombosis) ([place]   . Dysfunctional uterine bleeding   . Fibroids   . Obesity     Past Surgical History:  Procedure Laterality Date  . CESAREAN SECTION    . DILITATION & CURRETTAGE/HYSTROSCOPY WITH HYDROTHERMAL ABLATION N/A 10/08/2014   Procedure: DILATATION & CURETTAGE/HYSTEROSCOPY WITH HYDROTHERMAL ABLATION;  Surgeon: UOsborne Oman MD;  [facility];  Service: Gynecology;  Laterality: N/A;  . IR ANGIOGRAM PELVIS SELECTIVE OR SUPRASELECTIVE   12/03/2019  . IR ANGIOGRAM PELVIS SELECTIVE OR SUPRASELECTIVE  12/03/2019  . IR ANGIOGRAM SELECTIVE EACH ADDITIONAL VESSEL  12/03/2019  . IR ANGIOGRAM SELECTIVE EACH ADDITIONAL VESSEL  12/03/2019  . IR EMBO TUMOR ORGAN ISCHEMIA INFARCT INC GUIDE ROADMAPPING  12/03/2019  . IR RADIOLOGIST EVAL & MGMT  10/10/2017  . IR RADIOLOGIST EVAL & MGMT  11/28/2017  . IR RADIOLOGIST EVAL & MGMT  12/12/2017  . IR RADIOLOGIST EVAL & MGMT  08/07/2019  . IR RADIOLOGIST EVAL & MGMT  10/15/2019  . IR SINUS/FIST TUBE CHK-NON GI  12/26/2017  . IR [place] V[place] ACCESS LEFT  12/03/2019  . IR [place] V[place] ACCESS RIGHT  12/03/2019  . MYOMECTOMY      Family History  Problem Relation Age of Onset  . Diabetes Paternal Grandmother   . Heart disease Neg Hx   . Hypertension Neg Hx   . Cancer Neg Hx        breast or colon cancer    Social History   Socioeconomic History  . Marital status: Married    Spouse name: Not on file  . Number of children: 1  . Years of education: Not on file  . Highest education level: Not on file  Occupational History  . Occupation: SAnimal nutritionist- [employer]  Tobacco Use  . Smoking status: Never Smoker  . Smokeless tobacco: Never Used  Vaping Use  . Vaping Use: Never used  Substance and Sexual Activity  . Alcohol use: Yes  Comment: rare  . Drug use: No  . Sexual activity: Yes    Partners: Male    Birth control/protection: None  Other Topics Concern  . Not on file  Social History Narrative  . Not on file   Social Determinants of Health   Financial Resource Strain:   . Difficulty of Paying Living Expenses:   Food Insecurity:   . Worried About Charity fundraiser in the Last Year:   . Arboriculturist in the Last Year:   Transportation Needs:   . Film/video editor (Medical):   Marland Kitchen Lack of Transportation (Non-Medical):   Physical Activity:   . Days of Exercise per Week:   . Minutes of Exercise per Session:   Stress:   . Feeling of Stress :   Social  Connections:   . Frequency of Communication with Friends and Family:   . Frequency of Social Gatherings with Friends and Family:   . Attends Religious Services:   . Active Member of Clubs or Organizations:   . Attends Archivist Meetings:   Marland Kitchen Marital Status:   Intimate Partner Violence:   . Fear of Current or Ex-Partner:   . Emotionally Abused:   Marland Kitchen Physically Abused:   . Sexually Abused:    Review of Systems  Constitutional: Negative for fatigue and unexpected weight change.       Wears seat belt Has started walking with husband  HENT: Negative for dental problem, hearing loss and tinnitus.   Eyes: Negative for visual disturbance.       No diplopia or unilateral vision loss  Respiratory: Negative for cough, chest tightness and shortness of breath.   Cardiovascular: Positive for leg swelling. Negative for chest pain and palpitations.  Gastrointestinal: Negative for blood in stool and constipation.       No heartburn  Endocrine: Negative for polydipsia and polyuria.  Genitourinary: Negative for dyspareunia, dysuria and hematuria.  Musculoskeletal: Negative for arthralgias, back pain and joint swelling.  Skin:       No suspicious lesions  Allergic/Immunologic: Negative for environmental allergies and immunocompromised state.  Neurological: Negative for dizziness, syncope, light-headedness and headaches.  Hematological: Negative for adenopathy. Does not bruise/bleed easily.  Psychiatric/Behavioral: Negative for dysphoric mood and sleep disturbance. The patient is not nervous/anxious.        Objective:   Physical Exam  Constitutional: No distress.  HENT:  Head: Normocephalic and atraumatic.  Right Ear: Tympanic membrane normal.  Mouth/Throat: Mucous membranes are moist.  Eyes: Pupils are equal, round, and reactive to light. Conjunctivae are normal.  Cardiovascular: Normal rate, regular rhythm and normal pulses. Exam reveals no gallop.  No murmur heard. Respiratory:  Effort normal and breath sounds normal. She has no wheezes. She has no rhonchi. She has no rales.  GI: Soft. There is no abdominal tenderness.  Musculoskeletal:     Cervical back: Normal range of motion.     Comments: No calf tenderness or sig swelling  Lymphadenopathy:    She has no cervical adenopathy.  Neurological: She is alert.  Skin:  Stasis changes left calf. Very small superficial ulcer on lateral lower calf (using antibiotic ointment)  Psychiatric: Her behavior is normal. Mood normal.           Assessment & Plan:

## 2020-02-16 ENCOUNTER — Ambulatory Visit: Payer: BC Managed Care – PPO | Admitting: Family Medicine

## 2020-02-16 ENCOUNTER — Other Ambulatory Visit (INDEPENDENT_AMBULATORY_CARE_PROVIDER_SITE_OTHER): Payer: BC Managed Care – PPO

## 2020-02-16 ENCOUNTER — Encounter: Payer: Self-pay | Admitting: Family Medicine

## 2020-02-16 ENCOUNTER — Other Ambulatory Visit: Payer: Self-pay

## 2020-02-16 ENCOUNTER — Ambulatory Visit
Admission: RE | Admit: 2020-02-16 | Discharge: 2020-02-16 | Disposition: A | Discharge status: Home / Self Care | Payer: BC Managed Care – PPO | Source: Ambulatory Visit | Attending: Family Medicine | Admitting: Family Medicine

## 2020-02-16 VITALS — BP 130/90 | HR 87 | Temp 98.3°F | Ht 69.0 in | Wt 235.0 lb

## 2020-02-16 DIAGNOSIS — M79605 Pain in left leg: Secondary | ICD-10-CM

## 2020-02-16 DIAGNOSIS — M79672 Pain in left foot: Secondary | ICD-10-CM

## 2020-02-16 DIAGNOSIS — S91002A Unspecified open wound, left ankle, initial encounter: Secondary | ICD-10-CM

## 2020-02-16 DIAGNOSIS — M7989 Other specified soft tissue disorders: Secondary | ICD-10-CM

## 2020-02-16 DIAGNOSIS — Z1211 Encounter for screening for malignant neoplasm of colon: Secondary | ICD-10-CM | POA: Diagnosis not present

## 2020-02-16 DIAGNOSIS — Z86718 Personal history of other venous thrombosis and embolism: Secondary | ICD-10-CM

## 2020-02-16 LAB — FECAL OCCULT BLOOD, IMMUNOCHEMICAL: Fecal Occult Bld: NEGATIVE

## 2020-02-16 MED ORDER — AMOXICILLIN-POT CLAVULANATE 875-125 MG PO TABS: 1.0000 | ORAL_TABLET | Freq: Two times a day (BID) | ORAL | 0 refills | Status: DC

## 2020-02-16 NOTE — Patient Instructions (Signed)
Stop the Peroxide  Start antibiotics and other skin creams  Start Hydrogel - can get over the counter

## 2020-02-16 NOTE — Progress Notes (Signed)
Summer Mota T. Havannah Streat, MD, [place] [facility] [place], [address]  Phone: [phone]  FAX: Sarita - 44 y.o. female  MRN [id number]  Date of Birth: 07-May-1976  Date: 02/16/2020  PCP: Venia Carbon, MD  Referral: Venia Carbon, MD  Chief Complaint  Patient presents with  . Foot Swelling    Left  . Sore on Left Ankle    This visit occurred during the SARS-CoV-2 public health emergency.  Safety protocols were in place, including screening questions prior to the visit, additional usage of staff PPE, and extensive cleaning of exam room while observing appropriate contact time as indicated for disinfecting solutions.   Subjective:   [name] is a 44 y.o. very pleasant female patient with Body mass index is 34.7 kg/m. who presents with the following:  Left foot swelling and pain:  Has been there about 3 weeks with skin breakdown and prior history of venous ulcer.   She has a complex history of vascular disease as well as multiple DVTs in the past.  She is on Xarelto chronically.  She has been followed by wound care in the past for some significant left lower extremity wound breakdown.  She is also seeing vascular surgery in the past.  She has a complex procedural history from a vascular standpoint.  The primary issue today is the small open wound on her lateral ankle.  This is also somewhat tender to palpation.  No peroxide -she has been using this on home.  Hydrogel OTC  Review of Systems is noted in the HPI, as appropriate   Objective:   BP 130/90   Pulse 87   Temp 98.3 F (36.8 C) (Temporal)   Ht 5' 9"  (1.753 m)   Wt 235 lb (106.6 kg)   SpO2 97%   BMI 34.70 kg/m     GEN: No acute distress; alert,appropriate. PULM: Breathing comfortably in no respiratory distress PSYCH: Normally interactive.    Left lower  extremity with some modest swelling and trace to 1+ lower extremity edema.  There is some chronic darkening of her lower extremity.  There is a wound on the left lateral ankle and this is somewhat tender to palpation on exam.      Radiology: No results found.  Assessment and Plan:     ICD-10-CM   1. Acute foot pain, left  M79.672   2. Left leg swelling  M79.89 US Venous Img Lower Unilateral Left (DVT)  3. Left leg pain  M79.605 US Venous Img Lower Unilateral Left (DVT)  4. Ankle wound, left, initial encounter  S91.002A   5. History of recurrent deep vein thrombosis (DVT)  Z86.718    Painful very small wound on the lateral ankle in a patient who has known vascular disease.  For now start with hydrogel and add some antibiotics with anaerobic coverage.  This does appear to be some early infection as well.  I will have her f/u with PCP - familiar with her case and experienced with wound care.  Patient Instructions  Stop the Peroxide  Start antibiotics and other skin creams  Start Hydrogel - can get over the counter    Follow-up: Return for follow-up Dr. Silvio Pate next week..  Meds ordered this encounter  Medications  . amoxicillin-clavulanate (AUGMENTIN) 875-125 MG tablet    Sig: Take 1 tablet by mouth 2 (two) times daily for  10 days.    Dispense:  20 tablet    Refill:  0   There are no discontinued medications. Orders Placed This Encounter  Procedures  . US Venous Img Lower Unilateral Left (DVT)    Signed,  Edrei Norgaard T. Shalondra Wunschel, MD   Outpatient Encounter Medications as of 02/16/2020  Medication Sig  . norethindrone (AYGESTIN) 5 MG tablet Take one tablet 63m once per day until bleeding stops. Once bleeding stops can go back 10 mg. Please scheduled appointment.  .Alveda Reasons20 MG TABS tablet TAKE 1 TABLET (20 MG TOTAL) BY MOUTH DAILY WITH SUPPER.  .Marland Kitchenamoxicillin-clavulanate (AUGMENTIN) 875-125 MG tablet Take 1 tablet by mouth 2 (two) times daily for 10 days.    Facility-Administered Encounter Medications as of 02/16/2020  Medication  . oxyCODONE-acetaminophen (PERCOCET/ROXICET) 5-325 MG per tablet 2 tablet

## 2020-02-17 MED ORDER — AMOXICILLIN-POT CLAVULANATE 875-125 MG PO TABS: 1.0000 | ORAL_TABLET | Freq: Two times a day (BID) | ORAL | 0 refills | Status: AC

## 2020-02-17 NOTE — Addendum Note (Signed)
Addended by: Carter Kitten on: 02/17/2020 08:13 AM   Modules accepted: Orders

## 2020-02-22 NOTE — Progress Notes (Signed)
Patient Care Team: Venia Carbon, MD as PCP - General  DIAGNOSIS:    ICD-10-CM   1. 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  She reports that she had embolization procedure on the uterus and since she then she has not had any problems with bleeding.  ALLERGIES:  has No Known Allergies.  MEDICATIONS:  Current Outpatient Medications  Medication Sig Dispense Refill  . amoxicillin-clavulanate (AUGMENTIN) 875-125 MG tablet Take 1 tablet by mouth 2 (two) times daily for 10 days. 20 tablet 0  . norethindrone (AYGESTIN) 5 MG tablet Take one tablet 20m once per day until bleeding stops. Once bleeding stops can go back 10 mg. Please scheduled appointment. 180 tablet 5  . XARELTO 20 MG TABS tablet TAKE 1 TABLET (20 MG TOTAL) BY MOUTH DAILY WITH SUPPER. 90 tablet 3   No current facility-administered medications for this visit.   Facility-Administered Medications Ordered in Other Visits  Medication Dose Route Frequency Provider Last Rate Last Admin  . oxyCODONE-acetaminophen (PERCOCET/ROXICET) 5-325 MG per tablet 2 tablet  2 tablet Oral Once THarle Stanford, PA-C        PHYSICAL EXAMINATION: ECOG PERFORMANCE STATUS: 1 - Symptomatic but completely ambulatory  Vitals:   02/23/20 1041  BP: (!) 143/97  Pulse: 90  Resp: 18  Temp: 99.2 F (37.3 C)  SpO2: 100%   Filed Weights   02/23/20 1041  Weight: 233 lb 6.4 oz (105.9 kg)    LABORATORY DATA:  I have reviewed the data as listed CMP Latest Ref Rng & Units 12/03/2019 12/27/2017 11/22/2017  Glucose 70 - 99 mg/dL 99 88 99    BUN 6 - 20 mg/dL 10 4(L) 6  Creatinine 0.44 - 1.00 mg/dL 0.87 0.79 0.66  Sodium 135 - 145 mmol/L 140 139 144  Potassium 3.5 - 5.1 mmol/L 4.0 3.4(L) 3.6  Chloride 98 - 111 mmol/L 110 104 113(H)  CO2 22 - 32 mmol/L 22 27 23   Calcium 8.9 - 10.3 mg/dL 9.5 9.2 8.3(L)  Total Protein 6.4 - 8.3 g/dL - 7.9 -  Total Bilirubin 0.2 - 1.2 mg/dL - 0.3 -  Alkaline Phos 40 - 150 U/L - 59 -  AST 5 - 34 U/L - 14 -  ALT 0 - 55 U/L - 11 -    Lab Results  Component Value Date   WBC 4.4 02/23/2020   HGB 14.6 02/23/2020   HCT 46.1 (H) 02/23/2020   MCV 78.3 (L) 02/23/2020   PLT 255 02/23/2020   NEUTROABS 2.0 02/23/2020    ASSESSMENT & PLAN:  Iron deficiency anemia due to chronic blood loss Blood loss anemia with iron deficiency:2 units of blood given for hemoglobin of 6.8 in May 2019 She has chronic uterine bleeding. Patient underwent uterine ablation/embolization and since then she has not had any further problems with uterine bleeding.  Received IV iron treatment 04/26/2018 and 05/04/2018;November 2019, June 2020, January 2021  Lab review: 02/23/2020: Hemoglobin 14.6 Iron studies are pending. Since she had the uterine procedure, I do not anticipate that she will require IV iron in the future. I  will call her with the results of these tests. Patient has a 57 year old daughter she had several questions about vaccinating her for COVID-19. Return to clinic in 6 months with labs and follow-up.  If in 6 months her labs are normal then we can sign off at that time.  Orders Placed This Encounter  Procedures  . CBC with Differential (Cancer Center Only)    Standing Status:   Future    Standing Expiration Date:   02/22/2021  . Iron and TIBC    Standing Status:   Future    Standing Expiration Date:   02/22/2021  . Ferritin    Standing Status:   Future    Standing Expiration Date:   02/22/2021   The patient has a good understanding of the overall plan. she agrees with it. she will call with any  problems that may develop before the next visit here.  Total time spent: 20 mins including face to face time and time spent for planning, charting and coordination of care  Nicholas Lose, MD 02/23/2020  I, Cloyde Reams Dorshimer, am acting as scribe for Dr. Nicholas Lose.  I have reviewed the above documentation for accuracy and completeness, and I agree with the above.

## 2020-02-23 ENCOUNTER — Inpatient Hospital Stay: Payer: BC Managed Care – PPO | Attending: Hematology and Oncology

## 2020-02-23 ENCOUNTER — Other Ambulatory Visit: Payer: Self-pay

## 2020-02-23 ENCOUNTER — Inpatient Hospital Stay (HOSPITAL_BASED_OUTPATIENT_CLINIC_OR_DEPARTMENT_OTHER): Payer: BC Managed Care – PPO | Admitting: Hematology and Oncology

## 2020-02-23 ENCOUNTER — Telehealth: Payer: Self-pay | Admitting: Oncology

## 2020-02-23 DIAGNOSIS — Z793 Long term (current) use of hormonal contraceptives: Secondary | ICD-10-CM | POA: Insufficient documentation

## 2020-02-23 DIAGNOSIS — N939 Abnormal uterine and vaginal bleeding, unspecified: Secondary | ICD-10-CM | POA: Insufficient documentation

## 2020-02-23 DIAGNOSIS — Z86718 Personal history of other venous thrombosis and embolism: Secondary | ICD-10-CM | POA: Diagnosis not present

## 2020-02-23 DIAGNOSIS — D500 Iron deficiency anemia secondary to blood loss (chronic): Secondary | ICD-10-CM

## 2020-02-23 DIAGNOSIS — Z7901 Long term (current) use of anticoagulants: Secondary | ICD-10-CM | POA: Diagnosis not present

## 2020-02-23 DIAGNOSIS — D62 Acute posthemorrhagic anemia: Secondary | ICD-10-CM

## 2020-02-23 LAB — FERRITIN: Ferritin: 11 ng/mL (ref 11–307)

## 2020-02-23 LAB — CBC WITH DIFFERENTIAL (CANCER CENTER ONLY)
Abs Immature Granulocytes: 0.01 10*3/uL (ref 0.00–0.07)
Basophils Absolute: 0 10*3/uL (ref 0.0–0.1)
Basophils Relative: 1 %
Eosinophils Absolute: 0.1 10*3/uL (ref 0.0–0.5)
Eosinophils Relative: 2 %
HCT: 46.1 % — ABNORMAL HIGH (ref 36.0–46.0)
Hemoglobin: 14.6 g/dL (ref 12.0–15.0)
Immature Granulocytes: 0 %
Lymphocytes Relative: 40 %
Lymphs Abs: 1.8 10*3/uL (ref 0.7–4.0)
MCH: 24.8 pg — ABNORMAL LOW (ref 26.0–34.0)
MCHC: 31.7 g/dL (ref 30.0–36.0)
MCV: 78.3 fL — ABNORMAL LOW (ref 80.0–100.0)
Monocytes Absolute: 0.4 10*3/uL (ref 0.1–1.0)
Monocytes Relative: 10 %
Neutro Abs: 2 10*3/uL (ref 1.7–7.7)
Neutrophils Relative %: 47 %
Platelet Count: 255 10*3/uL (ref 150–400)
RBC: 5.89 MIL/uL — ABNORMAL HIGH (ref 3.87–5.11)
RDW: 18.2 % — ABNORMAL HIGH (ref 11.5–15.5)
WBC Count: 4.4 10*3/uL (ref 4.0–10.5)
nRBC: 0 % (ref 0.0–0.2)

## 2020-02-23 LAB — IRON AND TIBC
Iron: 63 ug/dL (ref 41–142)
Saturation Ratios: 11 % — ABNORMAL LOW (ref 21–57)
TIBC: 569 ug/dL — ABNORMAL HIGH (ref 236–444)
UIBC: 505 ug/dL — ABNORMAL HIGH (ref 120–384)

## 2020-02-23 NOTE — Telephone Encounter (Signed)
Scheduled appts per 6/28 los. Pt declined print out of AVS and stated she would refer to mychart.

## 2020-02-23 NOTE — Assessment & Plan Note (Signed)
Blood loss anemia with iron deficiency:2 units of blood given for hemoglobin of 6.8 in May 2019 She has chronic uterine bleeding. She is planning to do a ablation procedure later next year.  Received IV iron treatment 04/26/2018 and 05/04/2018;November 2019, June 2020, January 2021  Lab review: Hemoglobin is 11, MCV 74.2 Iron studies are pending.  I suspect that she will be iron deficient. Return to clinic in 6 months with labs and follow-up

## 2020-02-25 ENCOUNTER — Encounter: Payer: Self-pay | Admitting: Internal Medicine

## 2020-02-25 ENCOUNTER — Ambulatory Visit: Payer: BC Managed Care – PPO | Admitting: Internal Medicine

## 2020-02-25 ENCOUNTER — Other Ambulatory Visit: Payer: Self-pay

## 2020-02-25 DIAGNOSIS — I83001 Varicose veins of unspecified lower extremity with ulcer of thigh: Secondary | ICD-10-CM | POA: Diagnosis not present

## 2020-02-25 DIAGNOSIS — L97101 Non-pressure chronic ulcer of unspecified thigh limited to breakdown of skin: Secondary | ICD-10-CM

## 2020-02-25 MED ORDER — FUROSEMIDE 40 MG PO TABS: 40.0000 mg | ORAL_TABLET | Freq: Every day | ORAL | 1 refills | Status: DC | PRN

## 2020-02-25 MED ORDER — SILVER SULFADIAZINE 1 % EX CREA: 1.0000 "application " | TOPICAL_CREAM | Freq: Every day | CUTANEOUS | 2 refills | Status: DC

## 2020-02-25 NOTE — Assessment & Plan Note (Signed)
Related to chronic venous disease Discussed need to avoid swelling----will give furosemide for prn use Compression hose Not really infected---will finish out augmentin and use silvadene topically

## 2020-02-25 NOTE — Progress Notes (Signed)
Subjective:    Patient ID: [name], female    DOB: [birth year], 44 y.o.   MRN: [id number]  HPI Here for follow up of worsening left leg ulcer This visit occurred during the SARS-CoV-2 public health emergency.  Safety protocols were in place, including screening questions prior to the visit, additional usage of staff PPE, and extensive cleaning of exam room while observing appropriate contact time as indicated for disinfecting solutions.   Ulcer worsened after my last visit She noticed more swelling in the leg---and had some shooting pains No known trauma  Started on augmentin  Had venous evaluation--no DVT Still some pain Using hydrogel topically  Current Outpatient Medications on File Prior to Visit  Medication Sig Dispense Refill  . amoxicillin-clavulanate (AUGMENTIN) 875-125 MG tablet Take 1 tablet by mouth 2 (two) times daily for 10 days. 20 tablet 0  . norethindrone (AYGESTIN) 5 MG tablet Take one tablet 13m once per day until bleeding stops. Once bleeding stops can go back 10 mg. Please scheduled appointment. 180 tablet 5  . XARELTO 20 MG TABS tablet TAKE 1 TABLET (20 MG TOTAL) BY MOUTH DAILY WITH SUPPER. 90 tablet 3   Current Facility-Administered Medications on File Prior to Visit  Medication Dose Route Frequency Provider Last Rate Last Admin  . oxyCODONE-acetaminophen (PERCOCET/ROXICET) 5-325 MG per tablet 2 tablet  2 tablet Oral Once THarle Stanford, PA-C        No Known Allergies  Past Medical History:  Diagnosis Date  . Anemia 09/2017   REQUIRING A TRANSFUSION  . [place]id syndrome ([facility]   . Breast cyst    BILATERAL  . DVT (deep venous thrombosis) ([facility]   . Dysfunctional uterine bleeding   . Fibroids   . Obesity     Past Surgical History:  Procedure Laterality Date  . CESAREAN SECTION    . DILITATION & CURRETTAGE/HYSTROSCOPY WITH HYDROTHERMAL ABLATION N/A 10/08/2014   Procedure: DILATATION & CURETTAGE/HYSTEROSCOPY WITH HYDROTHERMAL  ABLATION;  Surgeon: UOsborne Oman MD;  [facility];  Service: Gynecology;  Laterality: N/A;  . IR ANGIOGRAM PELVIS SELECTIVE OR SUPRASELECTIVE  12/03/2019  . IR ANGIOGRAM PELVIS SELECTIVE OR SUPRASELECTIVE  12/03/2019  . IR ANGIOGRAM SELECTIVE EACH ADDITIONAL VESSEL  12/03/2019  . IR ANGIOGRAM SELECTIVE EACH ADDITIONAL VESSEL  12/03/2019  . IR EMBO TUMOR ORGAN ISCHEMIA INFARCT INC GUIDE ROADMAPPING  12/03/2019  . IR RADIOLOGIST EVAL & MGMT  10/10/2017  . IR RADIOLOGIST EVAL & MGMT  11/28/2017  . IR RADIOLOGIST EVAL & MGMT  12/12/2017  . IR RADIOLOGIST EVAL & MGMT  08/07/2019  . IR RADIOLOGIST EVAL & MGMT  10/15/2019  . IR SINUS/FIST TUBE CHK-NON GI  12/26/2017  . IR [place] V[place] ACCESS LEFT  12/03/2019  . IR [place] V[place] ACCESS RIGHT  12/03/2019  . MYOMECTOMY      Family History  Problem Relation Age of Onset  . Diabetes Paternal Grandmother   . Heart disease Neg Hx   . Hypertension Neg Hx   . Cancer Neg Hx        breast or colon cancer    Social History   Socioeconomic History  . Marital status: Married    Spouse name: Not on file  . Number of children: 1  . Years of education: Not on file  . Highest education level: Not on file  Occupational History  . 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 Smoking status: Never Smoker  .  Smokeless tobacco: Never Used  Vaping Use  . Vaping Use: Never used  Substance and Sexual Activity  . Alcohol use: Yes    Comment: rare  . Drug use: No  . Sexual activity: Yes    Partners: Male    Birth control/protection: None  Other Topics Concern  . Not on file  Social History Narrative  . Not on file   Social Determinants of Health   Financial Resource Strain:   . Difficulty of Paying Living Expenses:   Food Insecurity:   . Worried About Charity fundraiser in the Last Year:   . Arboriculturist in the Last Year:   Transportation Needs:   . Film/video editor (Medical):   Marland Kitchen Lack of Transportation  (Non-Medical):   Physical Activity:   . Days of Exercise per Week:   . Minutes of Exercise per Session:   Stress:   . Feeling of Stress :   Social Connections:   . Frequency of Communication with Friends and Family:   . Frequency of Social Gatherings with Friends and Family:   . Attends Religious Services:   . Active Member of Clubs or Organizations:   . Attends Archivist Meetings:   Marland Kitchen Marital Status:   Intimate Partner Violence:   . Fear of Current or Ex-Partner:   . Emotionally Abused:   Marland Kitchen Physically Abused:   . Sexually Abused:    Review of Systems No fever No diarrhea or other problems with the antibiotic    Objective:   Physical Exam Cardiovascular:     Pulses: Normal pulses.  Skin:    Comments: 2 x 2 cm shallow ulcer on lateral lower left calf Not inflamed now Some early granulation            Assessment & Plan:

## 2020-02-27 ENCOUNTER — Ambulatory Visit: Payer: BC Managed Care – PPO | Admitting: Internal Medicine

## 2020-02-29 IMAGING — CT CT ABD-PELV W/ CM
2 of 5 series · 16 of 46 positions shown, 18 images · IV contrast (ISOVUE)
Comparison: CT abdomen pelvis dated August 02, 2017.

CLINICAL DATA: Fever, tachycardia, and leukocytosis. History of
uterine fibroids.

EXAM:
CT ABDOMEN AND PELVIS WITH CONTRAST
TECHNIQUE: Multidetector CT imaging of the abdomen and pelvis was performed
using the standard protocol following bolus administration of
intravenous contrast.
CONTRAST:  100 cc MIFMHW-GRR IOPAMIDOL (MIFMHW-GRR) INJECTION 61%

[Series 2: axial st · axial · 0.83mm/px · z∈[-344,+91]mm · 13 of 99 slices shown, 15 images]
[im 6/99  soft-tissue]
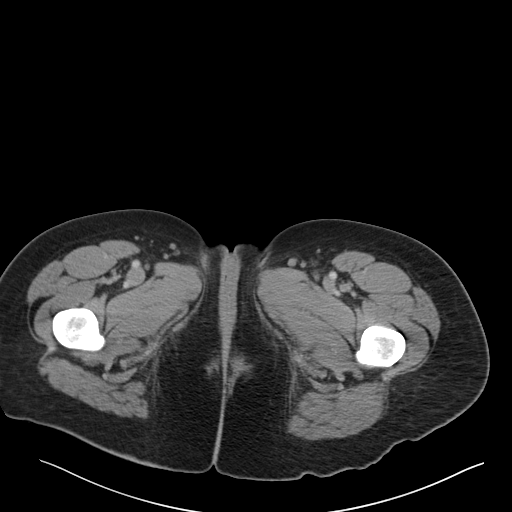
[im 6/99  bone]
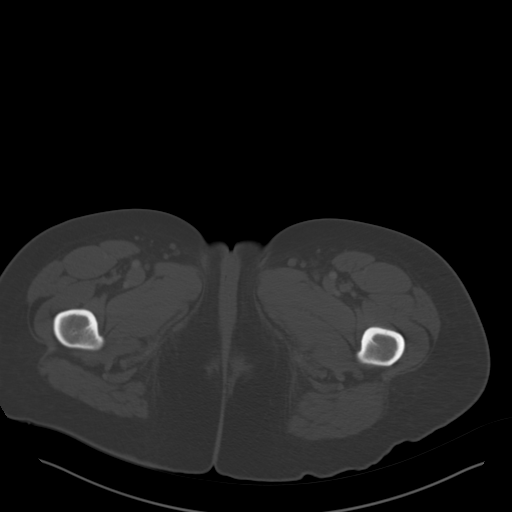
[im 11/99  soft-tissue]
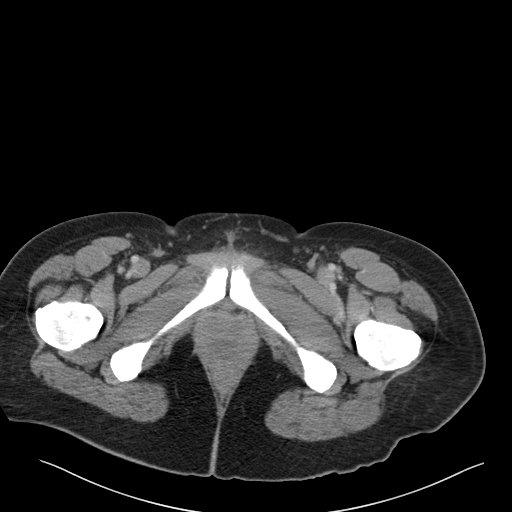
[im 22/99  soft-tissue]
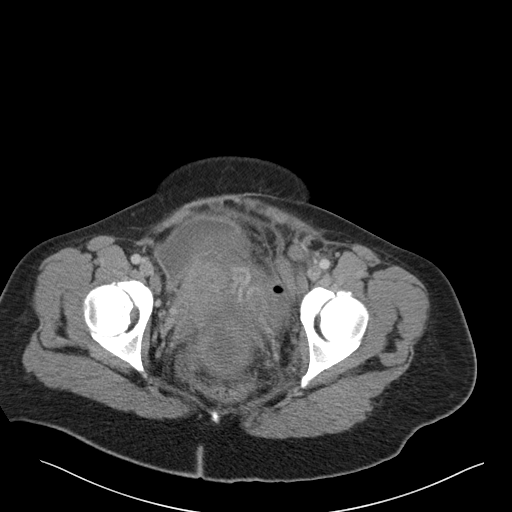
[im 28/99  soft-tissue]
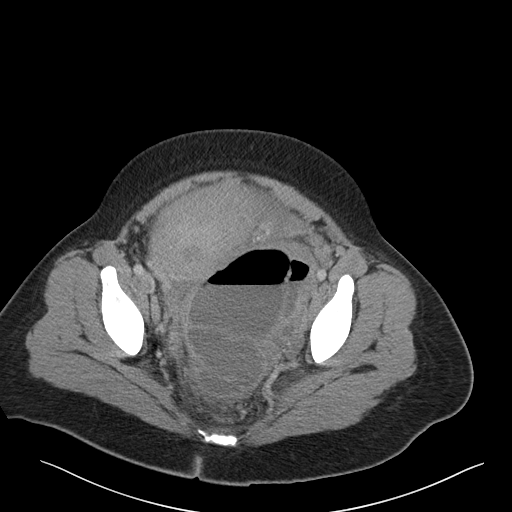
[im 33/99  soft-tissue]
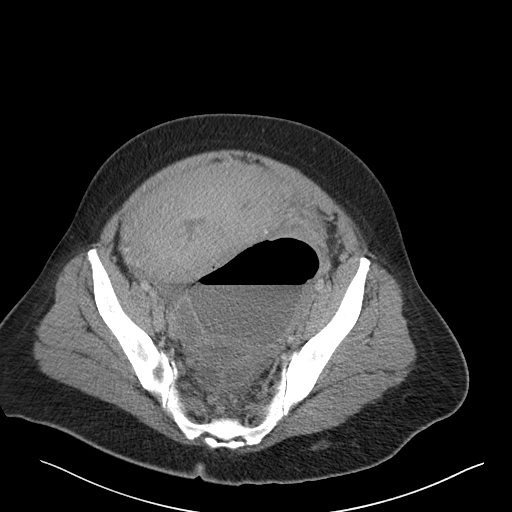
[im 44/99  soft-tissue]
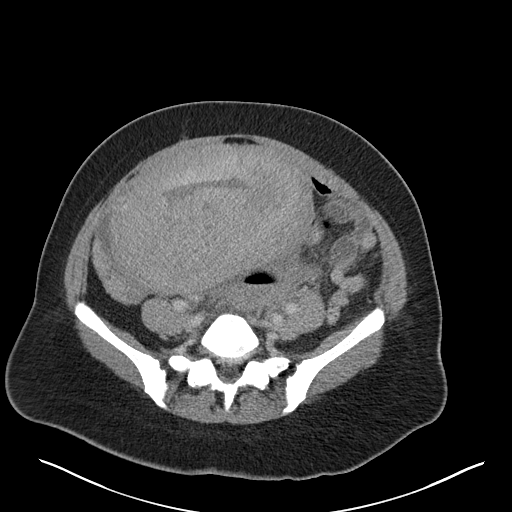
[im 50/99  soft-tissue]
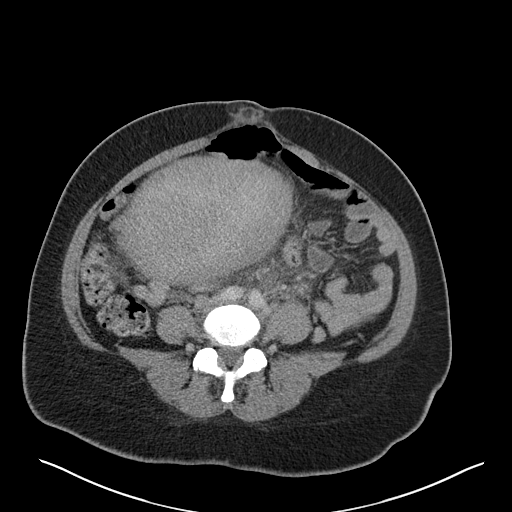
[im 55/99  soft-tissue]
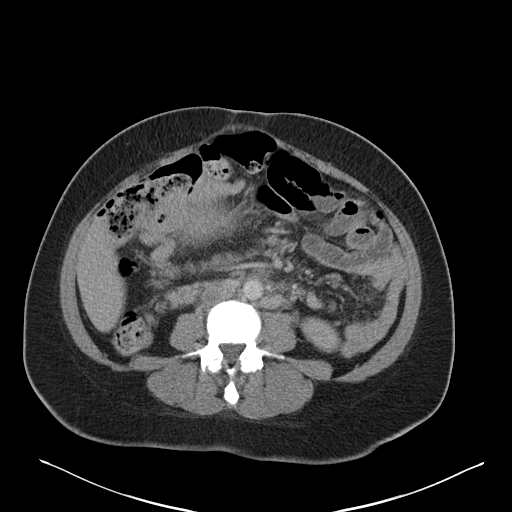
[im 66/99  soft-tissue]
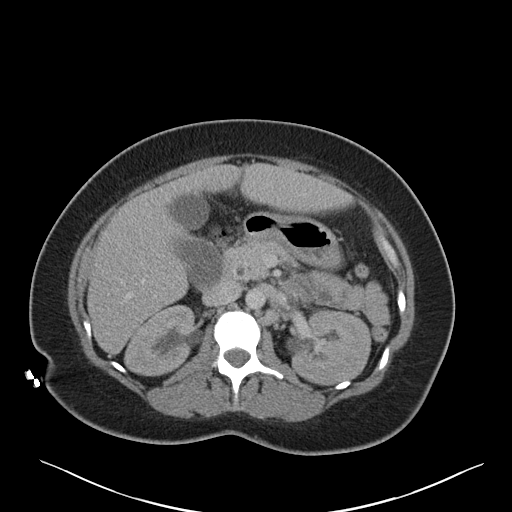
[im 66/99  bone]
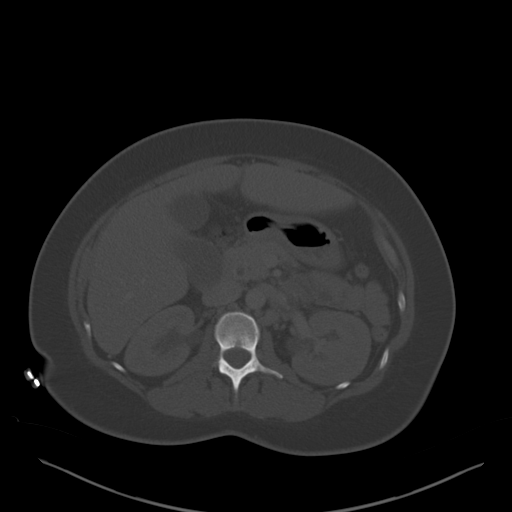
[im 71/99  soft-tissue]
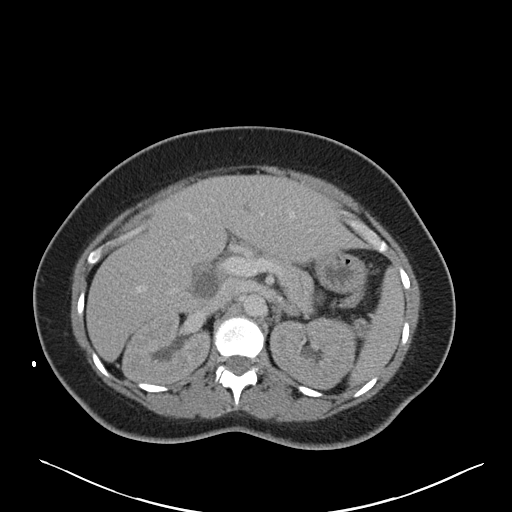
[im 77/99  soft-tissue]
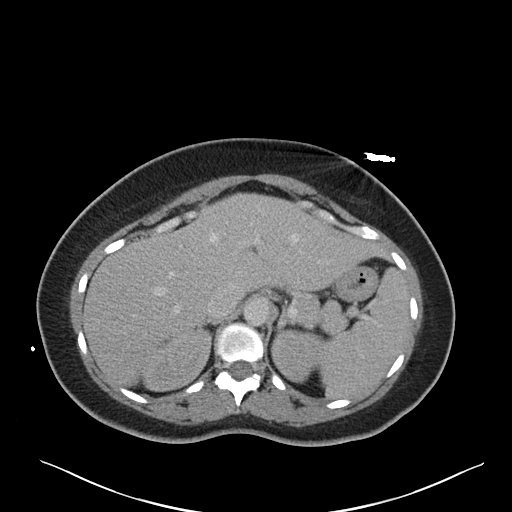
[im 88/99  soft-tissue]
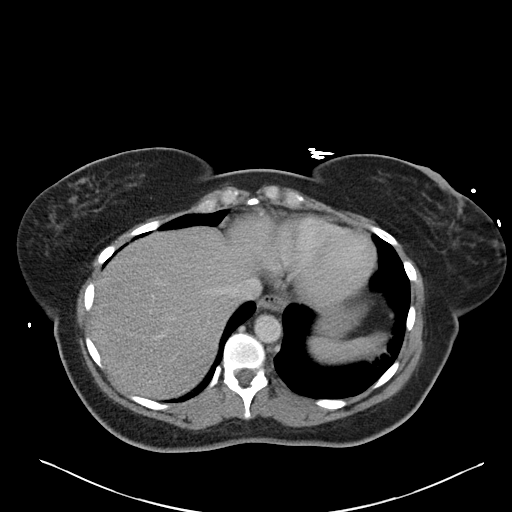
[im 93/99  soft-tissue]
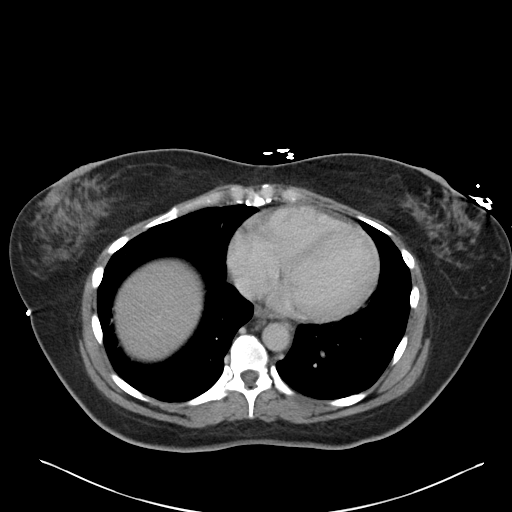

[Series 5: coronal st · coronal · 0.93mm/px · 3 of 101 slices shown]
[im 34/101  soft-tissue]
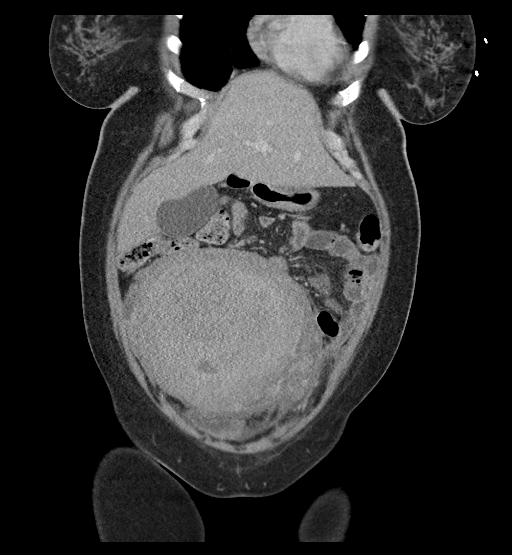
[im 45/101  soft-tissue]
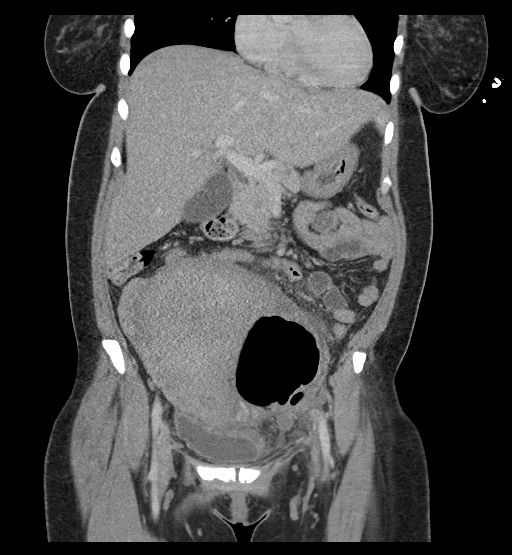
[im 56/101  soft-tissue]
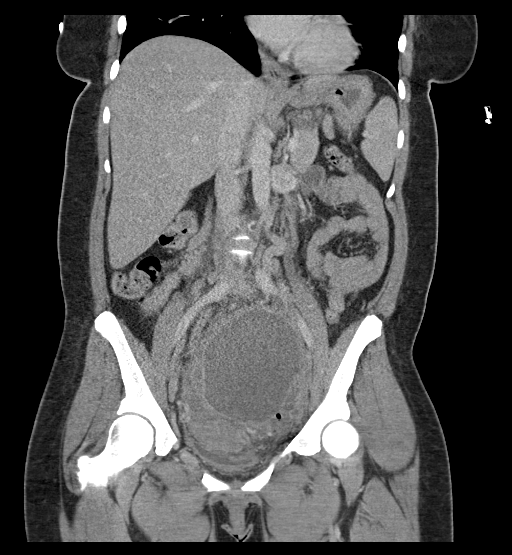

[16 of 46 positions shown; findings below may reference images not displayed]

FINDINGS: Lower chest: No acute abnormality.

Hepatobiliary: No focal liver abnormality. The gallbladder is
distended without wall thickening, gallstones, or biliary
dilatation.

Pancreas: Unremarkable. No pancreatic ductal dilatation or
surrounding inflammatory changes.

Spleen: Normal in size without focal abnormality.

Adrenals/Urinary Tract: The adrenal glands are unremarkable. Delayed
enhancement and excretion of contrast from the bilateral kidneys.
Mild bilateral hydronephrosis. No focal renal lesion. No renal or
ureteral calculi. Mild anterior and rightward displacement of the
bladder, which is otherwise unremarkable in appearance.

Stomach/Bowel: Stomach is within normal limits. Appendix is not
definitely visualized, however there are no secondary inflammatory
changes at the base of the cecum. No evidence of bowel wall
thickening, distention, or inflammatory changes.

Vascular/Lymphatic: Hypodense filling defect within the IVC.
Multiple enlarged left external iliac lymph nodes measuring up to 13
mm in short axis. Multiple mildly enlarged retroperitoneal lymph
nodes measuring up to 12 mm in short axis.

Reproductive: Interval increase in size of the uterus, now measuring
up to 11.2 x 17.5 x 18.4 cm, previously 11.0 x 13.2 x 15.5 cm, with
interval increase in size of the large posterior intramural fibroid.
Interval increase in size of the multiloculated, fluid and
air-filled left adnexal mass, measuring 13.8 x 8.4 x 11.1 cm. There
are surrounding inflammatory changes. The right adnexa is
unremarkable.

Other: Small amount of free fluid in the pelvis. No
pneumoperitoneum. Small fat containing umbilical hernia.

Musculoskeletal: No acute or significant osseous findings.
IMPRESSION: 1. Large multiloculated, fluid and air-filled left adnexal mass,
measuring 13.8 x 8.4 x 11.1 cm, with surrounding inflammatory
changes, concerning for tubo-ovarian abscess.
2. Interval increase in size of the uterus due to enlargement of a
large posterior intramural fibroid.
3. New mild bilateral hydronephrosis and delayed renal function,
likely secondary to bilateral distal ureteral obstruction due to
pelvic pathology.
4. Hypodense filling defect within the IVC, concerning for thrombus.
5. Mild retroperitoneal and left external iliac lymphadenopathy is
nonspecific, but may be reactive.

These results were called by telephone at the time of interpretation
on 11/15/2017 at [DATE] to Dr. ONOFRE MISAEL , who verbally
acknowledged these results.

## 2020-02-29 IMAGING — CR DG CHEST 2V
2 series · 2 of 2 positions shown · non-contrast
Comparison: CT chest 04/08/2008

CLINICAL DATA: Low-grade fever, tachycardia, leukocytosis

EXAM:
CHEST - 2 VIEW

[w chest lat]
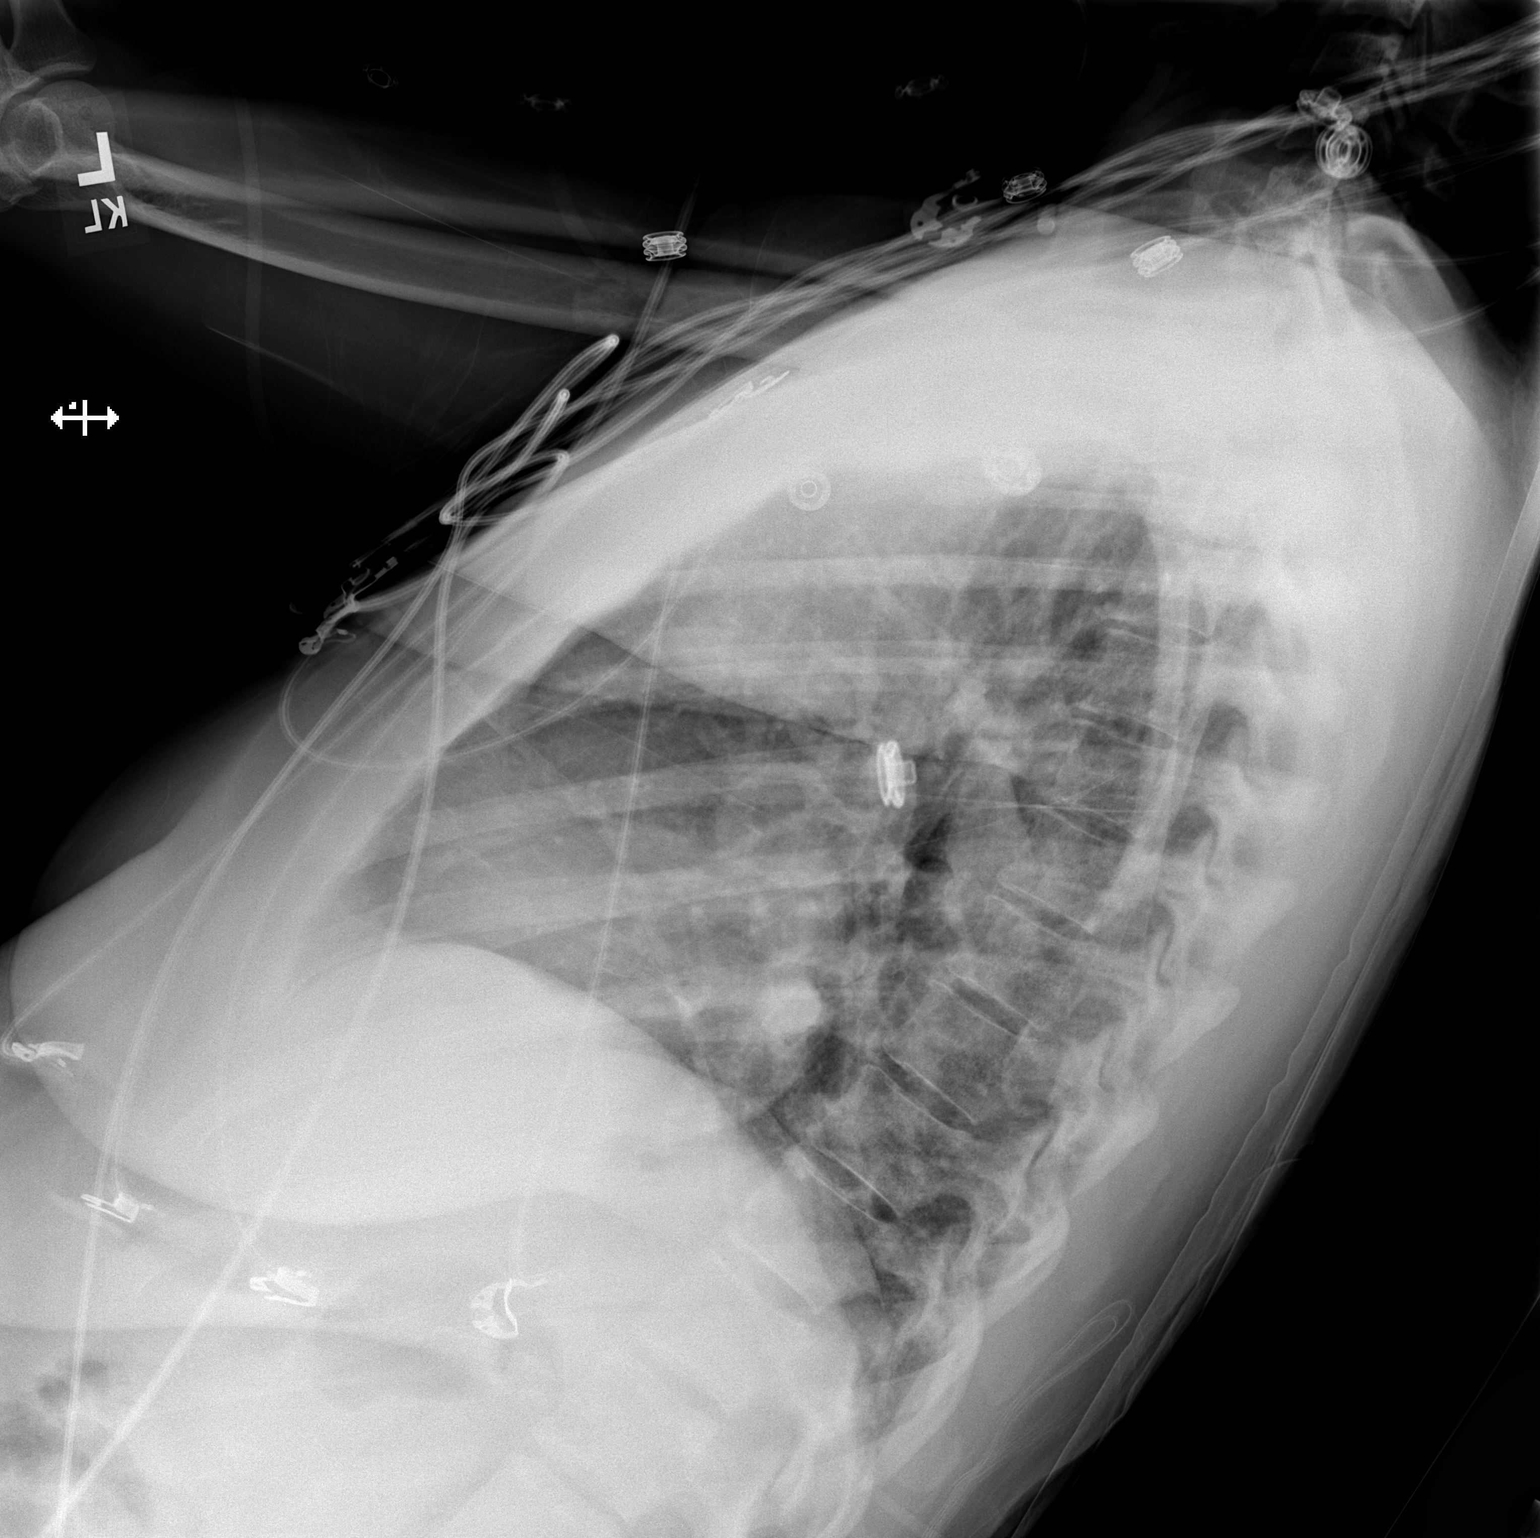

[x chest ap]
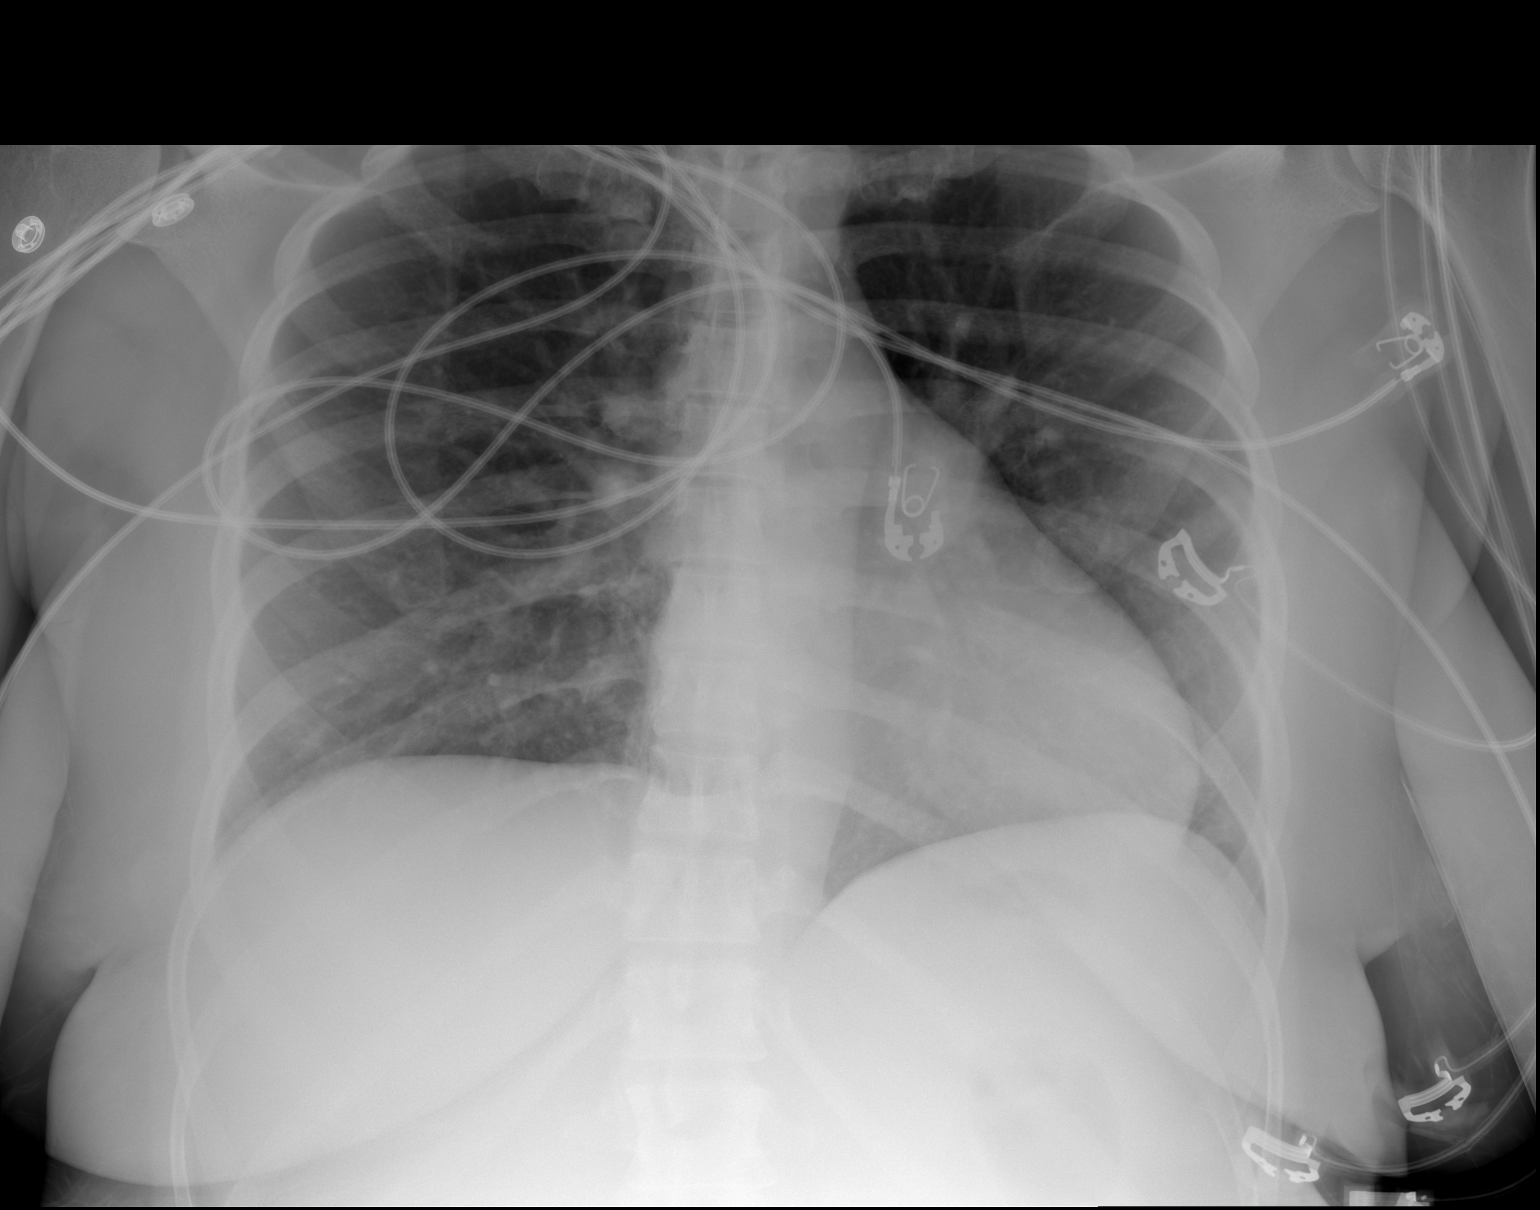

[2 of 2 positions shown; findings below may reference images not displayed]

FINDINGS: Normal heart size, mediastinal contours, and pulmonary vascularity.

Lungs clear.

No pulmonary infiltrate, pleural effusion or pneumothorax.

Bones unremarkable.
IMPRESSION: No acute abnormalities.

## 2020-03-18 ENCOUNTER — Other Ambulatory Visit: Payer: Self-pay | Admitting: Internal Medicine

## 2020-03-24 ENCOUNTER — Encounter: Payer: Self-pay | Admitting: Internal Medicine

## 2020-03-24 ENCOUNTER — Ambulatory Visit (INDEPENDENT_AMBULATORY_CARE_PROVIDER_SITE_OTHER): Payer: BC Managed Care – PPO | Admitting: Internal Medicine

## 2020-03-24 ENCOUNTER — Other Ambulatory Visit: Payer: Self-pay

## 2020-03-24 VITALS — BP 104/70 | HR 89 | Temp 97.9°F | Ht 69.0 in | Wt 237.0 lb

## 2020-03-24 DIAGNOSIS — I872 Venous insufficiency (chronic) (peripheral): Secondary | ICD-10-CM

## 2020-03-24 DIAGNOSIS — L97201 Non-pressure chronic ulcer of unspecified calf limited to breakdown of skin: Secondary | ICD-10-CM | POA: Diagnosis not present

## 2020-03-24 DIAGNOSIS — I83002 Varicose veins of unspecified lower extremity with ulcer of calf: Secondary | ICD-10-CM

## 2020-03-24 HISTORY — DX: Venous insufficiency (chronic) (peripheral): I87.2

## 2020-03-24 NOTE — Assessment & Plan Note (Signed)
Discussed compression hose, diuretics when she can Will set up with [facility]

## 2020-03-24 NOTE — Assessment & Plan Note (Signed)
Mild improvement No infection Discussed cleaning to stimulate borders and continuing daily silvadene

## 2020-03-24 NOTE — Progress Notes (Signed)
Subjective:    Patient ID: [name], female    DOB: July 30, 1976, 44 y.o.   MRN: [id number]  HPI Here for follow up of left leg ulcer This visit occurred during the SARS-CoV-2 public health emergency.  Safety protocols were in place, including screening questions prior to the visit, additional usage of staff PPE, and extensive cleaning of exam room while observing appropriate contact time as indicated for disinfecting solutions.   The ulcer seems some better Has been wrapping and using the silvadene Less pain now  Using the furosemide sparingly--only 2-3 times since last visit  Current Outpatient Medications on File Prior to Visit  Medication Sig Dispense Refill  . furosemide (LASIX) 40 MG tablet TAKE 1 TABLET BY MOUTH DAILY AS NEEDED 30 tablet 1  . norethindrone (AYGESTIN) 5 MG tablet Take one tablet 59m once per day until bleeding stops. Once bleeding stops can go back 10 mg. Please scheduled appointment. 180 tablet 5  . silver sulfADIAZINE (SILVADENE) 1 % cream Apply 1 application topically daily. 50 g 2  . XARELTO 20 MG TABS tablet TAKE 1 TABLET (20 MG TOTAL) BY MOUTH DAILY WITH SUPPER. 90 tablet 3   Current Facility-Administered Medications on File Prior to Visit  Medication Dose Route Frequency Provider Last Rate Last Admin  . oxyCODONE-acetaminophen (PERCOCET/ROXICET) 5-325 MG per tablet 2 tablet  2 tablet Oral Once THarle Stanford, PA-C        No Known Allergies  Past Medical History:  Diagnosis Date  . Anemia 09/2017   REQUIRING A TRANSFUSION  . Ant[place]olipid syndrome ([facility]   . Breast cyst    BILATERAL  . DVT (deep venous thrombosis) ([facility]   . Dysfunctional uterine bleeding   . Fibroids   . Obesity     Past Surgical History:  Procedure Laterality Date  . CESAREAN SECTION    . DILITATION & CURRETTAGE/HYSTROSCOPY WITH HYDROTHERMAL ABLATION N/A 10/08/2014   Procedure: DILATATION & CURETTAGE/HYSTEROSCOPY WITH HYDROTHERMAL ABLATION;  Surgeon: UOsborne Oman MD;  [facility];  Service: Gynecology;  Laterality: N/A;  . IR ANGIOGRAM PELVIS SELECTIVE OR SUPRASELECTIVE  12/03/2019  . IR ANGIOGRAM PELVIS SELECTIVE OR SUPRASELECTIVE  12/03/2019  . IR ANGIOGRAM SELECTIVE EACH ADDITIONAL VESSEL  12/03/2019  . IR ANGIOGRAM SELECTIVE EACH ADDITIONAL VESSEL  12/03/2019  . IR EMBO TUMOR ORGAN ISCHEMIA INFARCT INC GUIDE ROADMAPPING  12/03/2019  . IR RADIOLOGIST EVAL & MGMT  10/10/2017  . IR RADIOLOGIST EVAL & MGMT  11/28/2017  . IR RADIOLOGIST EVAL & MGMT  12/12/2017  . IR RADIOLOGIST EVAL & MGMT  08/07/2019  . IR RADIOLOGIST EVAL & MGMT  10/15/2019  . IR SINUS/FIST TUBE CHK-NON GI  12/26/2017  . IR [place] VASC ACCESS LEFT  12/03/2019  . IR [place] VASC ACCESS RIGHT  12/03/2019  . MYOMECTOMY      Family History  Problem Relation Age of Onset  . Diabetes Paternal Grandmother   . Heart disease Neg Hx   . Hypertension Neg Hx   . Cancer Neg Hx        breast or colon cancer    Social History   Socioeconomic History  . Marital status: Married    Spouse name: Not on file  . Number of children: 1  . Years of education: Not on file  . Highest education level: Not on file  Occupational History  . 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 Smoking status: Never  Smoker  . Smokeless tobacco: Never Used  Vaping Use  . Vaping Use: Never used  Substance and Sexual Activity  . Alcohol use: Yes    Comment: rare  . Drug use: No  . Sexual activity: Yes    Partners: Male    Birth control/protection: None  Other Topics Concern  . Not on file  Social History Narrative  . Not on file   Social Determinants of Health   Financial Resource Strain:   . Difficulty of Paying Living Expenses:   Food Insecurity:   . Worried About Charity fundraiser in the Last Year:   . Arboriculturist in the Last Year:   Transportation Needs:   . Film/video editor (Medical):   Marland Kitchen Lack of Transportation (Non-Medical):   Physical  Activity:   . Days of Exercise per Week:   . Minutes of Exercise per Session:   Stress:   . Feeling of Stress :   Social Connections:   . Frequency of Communication with Friends and Family:   . Frequency of Social Gatherings with Friends and Family:   . Attends Religious Services:   . Active Member of Clubs or Organizations:   . Attends Archivist Meetings:   Marland Kitchen Marital Status:   Intimate Partner Violence:   . Fear of Current or Ex-Partner:   . Emotionally Abused:   Marland Kitchen Physically Abused:   . Sexually Abused:    Review of Systems  No fever Feels okay Appetite is fine     Objective:   Physical Exam Constitutional:      Appearance: Normal appearance.  Musculoskeletal:     Right lower leg: No edema.     Left lower leg: No edema.  Skin:    Comments: Ulcer is now slightly smaller and irregular shaped Borders don't appear active No inflammation Same stasis changes  Neurological:     Mental Status: She is alert.            Assessment & Plan:

## 2020-04-10 ENCOUNTER — Other Ambulatory Visit: Payer: Self-pay | Admitting: Internal Medicine

## 2020-04-26 ENCOUNTER — Other Ambulatory Visit: Payer: Self-pay | Admitting: Internal Medicine

## 2020-05-05 ENCOUNTER — Other Ambulatory Visit: Payer: Self-pay

## 2020-05-05 ENCOUNTER — Ambulatory Visit
Admission: RE | Admit: 2020-05-05 | Discharge: 2020-05-05 | Disposition: A | Discharge status: Home / Self Care | Payer: BC Managed Care – PPO | Source: Ambulatory Visit | Attending: Physician Assistant | Admitting: Physician Assistant

## 2020-05-05 VITALS — BP 146/91 | HR 92 | Temp 98.5°F | Resp 18

## 2020-05-05 DIAGNOSIS — Z1152 Encounter for screening for COVID-19: Secondary | ICD-10-CM

## 2020-05-05 NOTE — Discharge Instructions (Signed)
If you know you were exposed to a confirmed case of COVID-19, continue quarantine until your test results are available.  Interact as little as possible until COVID-19 test results are available.  Continue to keep her social distance of 6 feet from others, wash hands frequently and wear facemask when indoors or when you are unable to social distance in outdoor settings.  If you develop any symptoms, reach out to the urgent care for further instructions.  If your test results are positive, a member of the urgent care team will reach out to you with further instructions. If you have any further questions, please don't hesitate to reach out to the urgent care clinic.  Please sign up for MyChart to access your lab results.

## 2020-05-05 NOTE — ED Triage Notes (Signed)
Pt states had a positive covid exposure last Thursday. States has had a headache that has subsided. Denies sx's at this time.

## 2020-05-06 ENCOUNTER — Telehealth: Payer: Self-pay | Admitting: Hematology and Oncology

## 2020-05-06 NOTE — Telephone Encounter (Signed)
Called pt per 9/9 sch msg - no answer. Left message for patient to call back to rescheduled.

## 2020-05-07 LAB — SARS-COV-2, NAA 2 DAY TAT

## 2020-05-07 LAB — NOVEL CORONAVIRUS, NAA: SARS-CoV-2, NAA: NOT DETECTED

## 2020-05-26 ENCOUNTER — Other Ambulatory Visit: Payer: Self-pay

## 2020-05-26 DIAGNOSIS — I872 Venous insufficiency (chronic) (peripheral): Secondary | ICD-10-CM

## 2020-06-02 ENCOUNTER — Other Ambulatory Visit: Payer: Self-pay | Admitting: Interventional Radiology

## 2020-06-02 DIAGNOSIS — D250 Submucous leiomyoma of uterus: Secondary | ICD-10-CM

## 2020-06-08 ENCOUNTER — Telehealth: Payer: Self-pay | Admitting: Internal Medicine

## 2020-06-08 NOTE — Telephone Encounter (Signed)
Pt is experiencing pain in her right breast and called [facility], but they told her to call her PCP and has you to put in a referral for a diagnostic mammogram.  Does she need an appt w/ you first?  Please advise.  Thank you!

## 2020-06-08 NOTE — Telephone Encounter (Signed)
Yes---she should be seen before ordering the mammogram (may need ultrasound also if something is found----or may be able to hold off if nothing is palpable)

## 2020-06-09 NOTE — Telephone Encounter (Signed)
Spoke to pt. She wanted to see a female for this. I made her an appt with Tor Netters on 06-11-20.

## 2020-06-10 ENCOUNTER — Ambulatory Visit: Payer: BC Managed Care – PPO | Admitting: Vascular Surgery

## 2020-06-10 ENCOUNTER — Encounter: Payer: Self-pay | Admitting: Vascular Surgery

## 2020-06-10 ENCOUNTER — Ambulatory Visit (HOSPITAL_COMMUNITY)
Admission: RE | Admit: 2020-06-10 | Discharge: 2020-06-10 | Disposition: A | Discharge status: Home / Self Care | Payer: BC Managed Care – PPO | Source: Ambulatory Visit | Attending: Vascular Surgery | Admitting: Vascular Surgery

## 2020-06-10 ENCOUNTER — Other Ambulatory Visit: Payer: Self-pay

## 2020-06-10 VITALS — BP 133/88 | HR 99 | Temp 98.4°F | Resp 14 | Ht 69.0 in | Wt 240.0 lb

## 2020-06-10 DIAGNOSIS — I83023 Varicose veins of left lower extremity with ulcer of ankle: Secondary | ICD-10-CM | POA: Diagnosis not present

## 2020-06-10 DIAGNOSIS — I872 Venous insufficiency (chronic) (peripheral): Secondary | ICD-10-CM | POA: Insufficient documentation

## 2020-06-10 DIAGNOSIS — L97329 Non-pressure chronic ulcer of left ankle with unspecified severity: Secondary | ICD-10-CM | POA: Diagnosis not present

## 2020-06-10 NOTE — Progress Notes (Signed)
REASON FOR VISIT:   Chronic venous insufficiency.  The consult is requested by Dr. Viviana Simpler.  MEDICAL ISSUES:   CHRONIC VENOUS INSUFFICIENCY: This patient has CEAP C5 venous disease.  Currently her venous ulcer has healed.  She has evidence of chronic clot in the common femoral vein and femoral vein on the left.  There is no significant superficial venous reflux.  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  She is on chronic anticoagulation.  She is on Xarelto.  We have discussed the importance of intermittent leg elevation the proper positioning for this.  In addition I have encouraged her to continue to wear her knee-high compression stockings.  If she would tolerate them I have recommended a gradient of 20 to 30 mmHg as this would lower her risk of recurrence.  In addition I have encouraged her to avoid prolonged sitting and standing.  We discussed importance of exercise specifically walking and water aerobics.  If risks symptoms worsen I think a follow-up CT venogram would be helpful.  I have offered to see her back for routine follow-up but she would like to call if her symptoms progress.  I have also encouraged her to keep her skin well lubricated especially in the winter to prevent recurrent ulcerations.   HPI:   [name] is a pleasant 44 y.o. female who I saw back in October 2019 with a venous ulcer of the left leg.  At that time on exam the patient had bilateral lower extremity swelling.  There was hyperpigmentation of the distal left leg with a venous ulcer along the lateral aspect of the leg which was a centimeter in diameter.  I looked at the great saphenous vein and common femoral vein on the left myself with the SonoSite.  The patient had reflux in the great saphenous vein which was dilated.  However the femoral vein was noncompressible.  Patient had excellent arterial flow at that time with a palpable dorsalis pedis pulse and a biphasic posterior tibial signal with a  Doppler.  We discussed the importance of leg elevation and compression therapy.  I do not think she was a good candidate for laser ablation of the left great saphenous vein given that the femoral vein was noncompressible with evidence of proximal obstruction.   This patient comes in with complaints of aching pain and heaviness in the left leg which is aggravated by standing and sitting relieved somewhat with elevation.  She has healed the ulcer on the lateral aspect of her left leg.  Of note she had uterine fibroids with a significantly enlarged uterus and has undergone an embolization procedure in April of this year.   The patient has a history of bilateral lower extremity DVTs.  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  She said no previous venous procedures.  She does elevate her legs which helps her symptoms some.  She also wears knee-high compression stockings with a gradient of 15 to 20 mmHg which help her symptoms some.    Past Medical History:  Diagnosis Date  . Anemia 09/2017   REQUIRING A TRANSFUSION  . Antiphospholipid syndrome ([place])   . Breast cyst    BILATERAL  . DVT (deep venous thrombosis) ([place])   . Dysfunctional uterine bleeding   . Fibroids   . Obesity     Family History  Problem Relation Age of Onset  . Diabetes Paternal Grandmother   .  Heart disease Neg Hx   . Hypertension Neg Hx   . Cancer Neg Hx        breast or colon cancer    SOCIAL HISTORY: Social History   Tobacco Use  . Smoking status: Never Smoker  . Smokeless tobacco: Never Used  Substance Use Topics  . Alcohol use: Yes    Comment: rare    No Known Allergies  Current Outpatient Medications  Medication Sig Dispense Refill  . furosemide (LASIX) 40 MG tablet TAKE 1 TABLET BY MOUTH DAILY AS NEEDED (Patient taking differently: No sig reported) 90 tablet 0  . norethindrone (AYGESTIN) 5 MG tablet Take one tablet 59m once per day until bleeding  stops. Once bleeding stops can go back 10 mg. Please scheduled appointment. 180 tablet 5  . silver sulfADIAZINE (SILVADENE) 1 % cream Apply 1 application topically daily. 50 g 2  . XARELTO 20 MG TABS tablet TAKE 1 TABLET (20 MG TOTAL) BY MOUTH DAILY WITH SUPPER. 90 tablet 3   No current facility-administered medications for this visit.   Facility-Administered Medications Ordered in Other Visits  Medication Dose Route Frequency Provider Last Rate Last Admin  . oxyCODONE-acetaminophen (PERCOCET/ROXICET) 5-325 MG per tablet 2 tablet  2 tablet Oral Once THarle Stanford, PA-C        REVIEW OF SYSTEMS:  [X]  denotes positive finding, [ ]  denotes negative finding Cardiac  Comments:  Chest pain or chest pressure:    Shortness of breath upon exertion:    Short of breath when lying flat:    Irregular heart rhythm:        Vascular    Pain in calf, thigh, or hip brought on by ambulation:    Pain in feet at night that wakes you up from your sleep:     Blood clot in your veins:    Leg swelling:  x       Pulmonary    Oxygen at home:    Productive cough:     Wheezing:         Neurologic    Sudden weakness in arms or legs:     Sudden numbness in arms or legs:     Sudden onset of difficulty speaking or slurred speech:    Temporary loss of vision in one eye:     Problems with dizziness:         Gastrointestinal    Blood in stool:     Vomited blood:         Genitourinary    Burning when urinating:     Blood in urine:        Psychiatric    Major depression:         Hematologic    Bleeding problems:    Problems with blood clotting too easily:        Skin    Rashes or ulcers:        Constitutional    Fever or chills:     PHYSICAL EXAM:   Vitals:   06/10/20 1504  BP: 133/88  Pulse: 99  Resp: 14  Temp: 98.4 F (36.9 C)  TempSrc: Temporal  SpO2: 98%  Weight: 240 lb (108.9 kg)  Height: 5' 9"  (1.753 m)    GENERAL: The patient is a well-nourished female, in no acute  distress. The vital signs are documented above. CARDIAC: There is a regular rate and rhythm.  VASCULAR: I do not detect carotid bruits. She has palpable dorsalis pedis and posterior tibial  pulses bilaterally. She has hyperpigmentation of the left leg as documented in the photograph below.      PULMONARY: There is good air exchange bilaterally without wheezing or rales. ABDOMEN: Soft and non-tender with normal pitched bowel sounds.  MUSCULOSKELETAL: There are no major deformities or cyanosis. NEUROLOGIC: No focal weakness or paresthesias are detected. SKIN: There are no ulcers or rashes noted. PSYCHIATRIC: The patient has a normal affect.  DATA:    VENOUS DUPLEX: I have independently interpreted her venous duplex scan today.  This was of the left lower extremity.  She appears to have chronic clot in the common femoral vein and femoral vein on the left.  There is continuous flow in the common femoral vein suggesting proximal obstruction.  There appears to be compression of the iliac vein by her uterus.  There are short segments of reflux in the superficial system that do not appear to be clinically significant.  She has some reflux at the saphenofemoral junction.  She has some reflux in the great saphenous vein at the knee.  She has some reflux in the small saphenous vein in the mid calf.  Majority of the small saphenous and great saphenous veins are competent.  She does not have any significant deep venous reflux.  CT ABDOMEN PELVIS: I reviewed her CT of the abdomen pelvis that was done in April 2019.  At that time she had an enlarged leiomyomatous uterus.  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  Deitra Mayo [facility] [phone]

## 2020-06-11 ENCOUNTER — Ambulatory Visit: Payer: BC Managed Care – PPO | Admitting: Family Medicine

## 2020-06-11 ENCOUNTER — Encounter: Payer: Self-pay | Admitting: Family Medicine

## 2020-06-11 VITALS — BP 126/88 | HR 91 | Temp 97.4°F | Ht 69.0 in | Wt 240.0 lb

## 2020-06-11 DIAGNOSIS — N644 Mastodynia: Secondary | ICD-10-CM | POA: Diagnosis not present

## 2020-06-11 NOTE — Progress Notes (Signed)
   Subjective:    Patient ID: [name], female    DOB: [birth year], 44 y.o.   MRN: [id number]  HPI Chief Complaint  Patient presents with  . Breast Pain    x 4 days burning sensation right breast. Also felt burning sensation in left breast but no sensation feeling now.   Last mammo 09/16/18. Has been told she has dense breasts. No nipple discharge, no fevers. No family history of breast cancer.  Burning sensation under right nipple, none now.   On Xarelto, denies any s/s bleeding. Followed by hematology.    Review of Systems Per HPI    Objective:   Physical Exam Vitals reviewed.  Constitutional:      General: She is not in acute distress.    Appearance: Normal appearance. She is obese. She is not ill-appearing, toxic-appearing or diaphoretic.  HENT:     Head: Normocephalic and atraumatic.  Cardiovascular:     Rate and Rhythm: Normal rate.  Pulmonary:     Effort: Pulmonary effort is normal.  Chest:     Breasts: Breasts are symmetrical.        Right: Normal. No swelling, bleeding, inverted nipple, mass, nipple discharge, skin change or tenderness.        Left: Normal. No swelling, bleeding, inverted nipple, mass, nipple discharge, skin change or tenderness.     Comments: Examined patient sitting and supine.  Lymphadenopathy:     Upper Body:     Right upper body: No supraclavicular, axillary or pectoral adenopathy.     Left upper body: No supraclavicular, axillary or pectoral adenopathy.  Neurological:     Mental Status: She is alert.      BP 126/88   Pulse 91   Temp (!) 97.4 F (36.3 C) (Temporal)   Ht 5' 9"  (1.753 m)   Wt 240 lb (108.9 kg)   SpO2 98%   BMI 35.44 kg/m  Wt Readings from Last 3 Encounters:  06/11/20 240 lb (108.9 kg)  06/10/20 240 lb (108.9 kg)  03/24/20 (!) 237 lb (107.5 kg)        Assessment & Plan:  1. Breast pain, right - no abnormal findings on physical exam - will send for diagnostic mammogram - MM DIAG BREAST TOMO  BILATERAL; Future  This visit occurred during the SARS-CoV-2 public health emergency.  Safety protocols were in place, including screening questions prior to the visit, additional usage of staff PPE, and extensive cleaning of exam room while observing appropriate contact time as indicated for disinfecting solutions.    Clarene Reamer, FNP-BC  [facility]  06/11/2020 9:31 AM

## 2020-06-11 NOTE — Patient Instructions (Signed)
Good to meet you today  I have placed an order for you to have a diagnostic mamm[facility] [place], you will get a call to schedule in the next day or two

## 2020-06-15 ENCOUNTER — Other Ambulatory Visit: Payer: Self-pay | Admitting: Family Medicine

## 2020-06-15 DIAGNOSIS — N644 Mastodynia: Secondary | ICD-10-CM

## 2020-07-05 ENCOUNTER — Other Ambulatory Visit: Payer: BC Managed Care – PPO

## 2020-07-08 ENCOUNTER — Ambulatory Visit: Payer: BC Managed Care – PPO

## 2020-07-08 ENCOUNTER — Other Ambulatory Visit: Payer: Self-pay

## 2020-07-08 ENCOUNTER — Ambulatory Visit
Admission: RE | Admit: 2020-07-08 | Discharge: 2020-07-08 | Disposition: A | Discharge status: Home / Self Care | Payer: BC Managed Care – PPO | Source: Ambulatory Visit | Attending: Family Medicine | Admitting: Family Medicine

## 2020-07-08 DIAGNOSIS — N644 Mastodynia: Secondary | ICD-10-CM

## 2020-08-04 ENCOUNTER — Other Ambulatory Visit: Payer: Self-pay | Admitting: Obstetrics & Gynecology

## 2020-08-04 DIAGNOSIS — D259 Leiomyoma of uterus, unspecified: Secondary | ICD-10-CM

## 2020-08-26 ENCOUNTER — Other Ambulatory Visit: Payer: BC Managed Care – PPO

## 2020-08-26 ENCOUNTER — Ambulatory Visit: Payer: BC Managed Care – PPO | Admitting: Hematology and Oncology

## 2020-08-29 NOTE — Progress Notes (Incomplete)
   Patient Care Team: Venia Carbon, MD as PCP - General  DIAGNOSIS: No diagnosis found.  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  ALLERGIES:  has No Known Allergies.  MEDICATIONS:  Current Outpatient Medications  Medication Sig Dispense Refill  . chlorhexidine (PERIDEX) 0.12 % solution SMARTSIG:By Mouth    . furosemide (LASIX) 40 MG tablet TAKE 1 TABLET BY MOUTH DAILY AS NEEDED (Patient taking differently: No sig reported) 90 tablet 0  . norethindrone (AYGESTIN) 5 MG tablet TAKE ONE TABLET 15MG ONCE PER DAY UNTIL BLEEDING STOPS. ONCE BLEEDING STOPS CAN GO BACK 10 MG. 180 tablet 5  . silver sulfADIAZINE (SILVADENE) 1 % cream Apply 1 application topically daily. 50 g 2  . XARELTO 20 MG TABS tablet TAKE 1 TABLET (20 MG TOTAL) BY MOUTH DAILY WITH SUPPER. 90 tablet 3   No current facility-administered medications for this visit.   Facility-Administered Medications Ordered in Other Visits  Medication Dose Route Frequency Provider Last Rate Last Admin  . oxyCODONE-acetaminophen (PERCOCET/ROXICET) 5-325 MG per tablet 2 tablet  2 tablet Oral Once Harle Stanford., PA-C        PHYSICAL EXAMINATION: ECOG PERFORMANCE STATUS: {CHL ONC ECOG ZD:[id number]}  There were no vitals filed for this visit. There were no vitals filed for this visit.  LABORATORY DATA:  I have reviewed the data as listed CMP Latest Ref Rng & Units 12/03/2019 12/27/2017 11/22/2017  Glucose 70 - 99 mg/dL 99 88 99  BUN 6 - 20 mg/dL 10 4(L) 6  Creatinine 0.44 - 1.00 mg/dL 0.87 0.79 0.66  Sodium 135 - 145 mmol/L 140 139 144  Potassium 3.5 - 5.1 mmol/L 4.0 3.4(L) 3.6  Chloride 98 - 111 mmol/L 110 104 113(H)  CO2 22 - 32 mmol/L 22 27 23    Calcium 8.9 - 10.3 mg/dL 9.5 9.2 8.3(L)  Total Protein 6.4 - 8.3 g/dL - 7.9 -  Total Bilirubin 0.2 - 1.2 mg/dL - 0.3 -  Alkaline Phos 40 - 150 U/L - 59 -  AST 5 - 34 U/L - 14 -  ALT 0 - 55 U/L - 11 -    Lab Results  Component Value Date   WBC 4.4 02/23/2020   HGB 14.6 02/23/2020   HCT 46.1 (H) 02/23/2020   MCV 78.3 (L) 02/23/2020   PLT 255 02/23/2020   NEUTROABS 2.0 02/23/2020    ASSESSMENT & PLAN:  No problem-specific Assessment & Plan notes found for this encounter.    No orders of the defined types were placed in this encounter.  The patient has a good understanding of the overall plan. she agrees with it. she will call with any problems that may develop before the next visit here.  Total time spent: *** mins including face to face time and time spent for planning, charting and coordination of care  Nicholas Lose, MD 08/29/2020  I, Cloyde Reams Dorshimer, am acting as scribe for Dr. Nicholas Lose.  {insert scribe attestation}

## 2020-08-30 ENCOUNTER — Telehealth: Payer: Self-pay | Admitting: Hematology and Oncology

## 2020-08-30 ENCOUNTER — Inpatient Hospital Stay: Payer: BC Managed Care – PPO

## 2020-08-30 ENCOUNTER — Inpatient Hospital Stay: Payer: BC Managed Care – PPO | Admitting: Hematology and Oncology

## 2020-08-30 NOTE — Assessment & Plan Note (Deleted)
Blood loss anemia with iron deficiency:2 units of blood given for hemoglobin of 6.8 in May 2019 She has chronic uterine bleeding. Patient underwent uterine ablation/embolization and since then she has not had any further problems with uterine bleeding.  Received IV iron treatment 04/26/2018 and 05/04/2018;November 2019, June 2020, January 2021  Lab review: 02/23/2020: Hemoglobin 14.6, ferritin 11, TIBC 569, iron saturation 11%   Since she had the uterine procedure, I do not anticipate that she will require IV iron in the future.

## 2020-08-30 NOTE — Telephone Encounter (Signed)
Cancelled appointment per 1/3 schedule message. Called patient to confirm. Patient is aware and will call back to reschedule.

## 2020-09-09 ENCOUNTER — Other Ambulatory Visit: Payer: Self-pay | Admitting: Internal Medicine

## 2020-09-11 ENCOUNTER — Other Ambulatory Visit: Payer: Self-pay

## 2020-09-11 DIAGNOSIS — Z20822 Contact with and (suspected) exposure to covid-19: Secondary | ICD-10-CM

## 2020-09-14 LAB — NOVEL CORONAVIRUS, NAA: SARS-CoV-2, NAA: DETECTED — AB

## 2020-09-16 ENCOUNTER — Encounter: Payer: Self-pay | Admitting: Internal Medicine

## 2020-09-16 ENCOUNTER — Telehealth (INDEPENDENT_AMBULATORY_CARE_PROVIDER_SITE_OTHER): Payer: Self-pay | Admitting: Internal Medicine

## 2020-09-16 DIAGNOSIS — J029 Acute pharyngitis, unspecified: Secondary | ICD-10-CM

## 2020-09-16 DIAGNOSIS — R0981 Nasal congestion: Secondary | ICD-10-CM

## 2020-09-16 DIAGNOSIS — U071 COVID-19: Secondary | ICD-10-CM

## 2020-09-16 DIAGNOSIS — R059 Cough, unspecified: Secondary | ICD-10-CM

## 2020-09-16 MED ORDER — PROMETHAZINE-DM 6.25-15 MG/5ML PO SYRP: 5.0000 mL | ORAL_SOLUTION | Freq: Four times a day (QID) | ORAL | 0 refills | Status: DC | PRN

## 2020-09-16 NOTE — Progress Notes (Signed)
Virtual Visit via Video Note  I connected with [name] on 09/16/20 at 11:30 AM EST by a video enabled telemedicine application and verified that I am speaking with the correct person using two identifiers.  Location: Patient: Home Provider: Office  Person's participating in this video call: Webb Silversmith, NP-C and Alyzza Andringa.   I discussed the limitations of evaluation and management by telemedicine and the availability of in person appointments. The patient expressed understanding and agreed to proceed.  History of Present Illness:   Pt reports nasal congestion, sore throat and cough. This started 10 days ago. She is blowing clear mucous out of her nose. The sore throat improved. The cough is mostly nonproductive. She denies headache, ear pain, loss of taste/smell or SOB. She denies fever, chills or body aches. She has tried Tylenol and Theraful with some relief of symptoms. She has had a positive covid test.  Past Medical History:  Diagnosis Date  . Anemia 09/2017   REQUIRING A TRANSFUSION  . Antiphospholipid syndrome ([place])   . Breast cyst    BILATERAL  . DVT (deep venous thrombosis) ([place])   . Dysfunctional uterine bleeding   . Fibroids   . Obesity     Current Outpatient Medications  Medication Sig Dispense Refill  . chlorhexidine (PERIDEX) 0.12 % solution SMARTSIG:By Mouth    . furosemide (LASIX) 40 MG tablet TAKE 1 TABLET BY MOUTH DAILY AS NEEDED (Patient taking differently: No sig reported) 90 tablet 0  . norethindrone (AYGESTIN) 5 MG tablet TAKE ONE TABLET 15MG ONCE PER DAY UNTIL BLEEDING STOPS. ONCE BLEEDING STOPS CAN GO BACK 10 MG. 180 tablet 5  . silver sulfADIAZINE (SILVADENE) 1 % cream Apply 1 application topically daily. 50 g 2  . XARELTO 20 MG TABS tablet TAKE 1 TABLET (20 MG TOTAL) BY MOUTH DAILY WITH SUPPER. 90 tablet 3   No current facility-administered medications for this visit.   Facility-Administered Medications Ordered in Other Visits   Medication Dose Route Frequency Provider Last Rate Last Admin  . oxyCODONE-acetaminophen (PERCOCET/ROXICET) 5-325 MG per tablet 2 tablet  2 tablet Oral Once Harle Stanford., PA-C        No Known Allergies  Family History  Problem Relation Age of Onset  . Diabetes Paternal Grandmother   . Heart disease Neg Hx   . Hypertension Neg Hx   . Cancer Neg Hx        breast or colon cancer    Social History   Socioeconomic History  . Marital status: Married    Spouse name: Not on file  . Number of children: 1  . Years of education: Not on file  . Highest education level: Not on file  Occupational History  . 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 Smoking status: Never Smoker  . Smokeless tobacco: Never Used  Vaping Use  . Vaping Use: Never used  Substance and Sexual Activity  . Alcohol use: Yes    Comment: rare  . Drug use: No  . Sexual activity: Yes    Partners: Male    Birth control/protection: None  Other Topics Concern  . Not on file  Social History Narrative  . Not on file   Social Determinants of Health   Financial Resource Strain: Not on file  Food Insecurity: Not on file  Transportation Needs: Not on file  Physical Activity: Not on file  Stress: Not on file  Social Connections: Not on file  Intimate Partner Violence: Not on file     Constitutional: Denies fever, malaise, fatigue, headache or abrupt weight changes.  HEENT: Pt reports nasal congestion, sore throat. Denies eye pain, eye redness, ear pain, ringing in the ears, wax buildup, runny nose,  bloody nose. Respiratory: Pt repots cough. Denies difficulty breathing, shortness of breath or sputum production.   Cardiovascular: Denies chest pain, chest tightness, palpitations or swelling in the hands or feet.  Gastrointestinal: Denies abdominal pain, bloating, constipation, diarrhea or blood in the stool.    No other specific complaints in a complete review of systems  (except as listed in HPI above).  Observations/Objective:   Wt Readings from Last 3 Encounters:  06/11/20 240 lb (108.9 kg)  06/10/20 240 lb (108.9 kg)  03/24/20 (!) 237 lb (107.5 kg)    General: Appears her stated age, in NAD. HEENT: Nose:slight congestion noted; Throat/Mouth: no hoarseness noted. Pulmonary/Chest: Normal effort. No respiratory distress.  Neurological: Alert and oriented.   BMET    Component Value Date/Time   NA 140 12/03/2019 1214   K 4.0 12/03/2019 1214   CL 110 12/03/2019 1214   CO2 22 12/03/2019 1214   GLUCOSE 99 12/03/2019 1214   BUN 10 12/03/2019 1214   CREATININE 0.87 12/03/2019 1214   CREATININE 0.79 12/27/2017 1147   CALCIUM 9.5 12/03/2019 1214   GFRNONAA >60 12/03/2019 1214   GFRNONAA >60 12/27/2017 1147   GFRAA >60 12/03/2019 1214   GFRAA >60 12/27/2017 1147    Lipid Panel     Component Value Date/Time   CHOL 140 03/08/2017 1602   TRIG 121.0 03/08/2017 1602   HDL 24.20 (L) 03/08/2017 1602   CHOLHDL 6 03/08/2017 1602   VLDL 24.2 03/08/2017 1602   LDLCALC 91 03/08/2017 1602    CBC    Component Value Date/Time   WBC 4.4 02/23/2020 1023   WBC 4.4 12/03/2019 1214   RBC 5.89 (H) 02/23/2020 1023   HGB 14.6 02/23/2020 1023   HGB 9.3 (L) 07/17/2017 1606   HCT 46.1 (H) 02/23/2020 1023   HCT 30.3 (L) 07/17/2017 1606   PLT 255 02/23/2020 1023   PLT 256 07/17/2017 1606   MCV 78.3 (L) 02/23/2020 1023   MCV 70.4 (L) 07/17/2017 1606   MCH 24.8 (L) 02/23/2020 1023   MCHC 31.7 02/23/2020 1023   RDW 18.2 (H) 02/23/2020 1023   RDW 20.9 (H) 07/17/2017 1606   LYMPHSABS 1.8 02/23/2020 1023   LYMPHSABS 2.0 07/17/2017 1606   MONOABS 0.4 02/23/2020 1023   MONOABS 0.5 07/17/2017 1606   EOSABS 0.1 02/23/2020 1023   EOSABS 0.1 07/17/2017 1606   BASOSABS 0.0 02/23/2020 1023   BASOSABS 0.0 07/17/2017 1606    Hgb A1C Lab Results  Component Value Date   HGBA1C 5.5 10/02/2017       Assessment and Plan:  Nasal Congestion, Sore Throat, Cough  secondary to Covid 19:  Encouraged rest and fluids Encouraged Tylenol/Ibuprofen as needed for body aches RX for Promethazine DM cough syrup  Follow Up Instructions:    I discussed the assessment and treatment plan with the patient. The patient was provided an opportunity to ask questions and all were answered. The patient agreed with the plan and demonstrated an understanding of the instructions.   The patient was advised to call back or seek an in-person evaluation if the symptoms worsen or if the condition fails to improve as anticipated.    Webb Silversmith, NP

## 2020-09-19 ENCOUNTER — Encounter: Payer: Self-pay | Admitting: Internal Medicine

## 2020-09-19 NOTE — Patient Instructions (Signed)

## 2020-09-30 ENCOUNTER — Other Ambulatory Visit: Payer: Self-pay | Admitting: Obstetrics & Gynecology

## 2020-09-30 DIAGNOSIS — Z1231 Encounter for screening mammogram for malignant neoplasm of breast: Secondary | ICD-10-CM

## 2020-11-19 ENCOUNTER — Ambulatory Visit
Admission: RE | Admit: 2020-11-19 | Discharge: 2020-11-19 | Disposition: A | Discharge status: Home / Self Care | Payer: BC Managed Care – PPO | Source: Ambulatory Visit | Attending: Obstetrics & Gynecology | Admitting: Obstetrics & Gynecology

## 2020-11-19 ENCOUNTER — Ambulatory Visit: Payer: Self-pay

## 2020-11-19 ENCOUNTER — Other Ambulatory Visit: Payer: Self-pay

## 2020-11-19 DIAGNOSIS — Z1231 Encounter for screening mammogram for malignant neoplasm of breast: Secondary | ICD-10-CM

## 2021-02-07 ENCOUNTER — Ambulatory Visit (INDEPENDENT_AMBULATORY_CARE_PROVIDER_SITE_OTHER): Payer: BC Managed Care – PPO | Admitting: Internal Medicine

## 2021-02-07 ENCOUNTER — Other Ambulatory Visit: Payer: Self-pay

## 2021-02-07 ENCOUNTER — Encounter: Payer: Self-pay | Admitting: Internal Medicine

## 2021-02-07 VITALS — BP 118/82 | HR 85 | Temp 97.5°F | Ht 68.75 in | Wt 249.0 lb

## 2021-02-07 DIAGNOSIS — I872 Venous insufficiency (chronic) (peripheral): Secondary | ICD-10-CM | POA: Diagnosis not present

## 2021-02-07 DIAGNOSIS — Z1211 Encounter for screening for malignant neoplasm of colon: Secondary | ICD-10-CM

## 2021-02-07 DIAGNOSIS — I82409 Acute embolism and thrombosis of unspecified deep veins of unspecified lower extremity: Secondary | ICD-10-CM

## 2021-02-07 DIAGNOSIS — Z0000 Encounter for general adult medical examination without abnormal findings: Secondary | ICD-10-CM | POA: Diagnosis not present

## 2021-02-07 NOTE — Assessment & Plan Note (Signed)
Uses the furosemide occasionally Had brief ulcer---better with compression wraps

## 2021-02-07 NOTE — Assessment & Plan Note (Signed)
No recurrence on the xarelto

## 2021-02-07 NOTE — Progress Notes (Signed)
Subjective:    Patient ID: [name], female    DOB: 31-Dec-1975, 45 y.o.   MRN: [id number]  HPI Here for physical This visit occurred during the SARS-CoV-2 public health emergency.  Safety protocols were in place, including screening questions prior to the visit, additional usage of staff PPE, and extensive cleaning of exam room while observing appropriate contact time as indicated for disinfecting solutions.   No more uterine bleeding Still on norethindrone  Continues on the xarelto Leg swelling controlled with prn furosemide Did have brief ulcer in April---better after she wrapped it for a while  Current Outpatient Medications on File Prior to Visit  Medication Sig Dispense Refill   norethindrone (AYGESTIN) 5 MG tablet TAKE ONE TABLET 15MG ONCE PER DAY UNTIL BLEEDING STOPS. ONCE BLEEDING STOPS CAN GO BACK 10 MG. 180 tablet 5   silver sulfADIAZINE (SILVADENE) 1 % cream Apply 1 application topically daily. 50 g 2   XARELTO 20 MG TABS tablet TAKE 1 TABLET (20 MG TOTAL) BY MOUTH DAILY WITH SUPPER. 90 tablet 3   furosemide (LASIX) 40 MG tablet TAKE 1 TABLET BY MOUTH DAILY AS NEEDED (Patient not taking: Reported on 02/07/2021) 90 tablet 0   No current facility-administered medications on file prior to visit.    Not on File  Past Medical History:  Diagnosis Date   Anemia 09/2017   REQUIRING A TRANSFUSION   Antiphospholipid syndrome (HCC)    Breast cyst    BILATERAL   DVT (deep venous thrombosis) ([place])    Dysfunctional uterine bleeding    Fibroids    Obesity     Past Surgical History:  Procedure Laterality Date   CESAREAN SECTION     DILITATION & CURRETTAGE/HYSTROSCOPY WITH HYDROTHERMAL ABLATION N/A 10/08/2014   Procedure: DILATATION & CURETTAGE/HYSTEROSCOPY WITH HYDROTHERMAL ABLATION;  Surgeon: Osborne Oman, MD;  Location: [facility];  Service: Gynecology;  Laterality: N/A;   IR ANGIOGRAM PELVIS SELECTIVE OR SUPRASELECTIVE  12/03/2019   IR ANGIOGRAM PELVIS  SELECTIVE OR SUPRASELECTIVE  12/03/2019   IR ANGIOGRAM SELECTIVE EACH ADDITIONAL VESSEL  12/03/2019   IR ANGIOGRAM SELECTIVE EACH ADDITIONAL VESSEL  12/03/2019   IR EMBO TUMOR ORGAN ISCHEMIA INFARCT INC GUIDE ROADMAPPING  12/03/2019   IR RADIOLOGIST EVAL & MGMT  10/10/2017   IR RADIOLOGIST EVAL & MGMT  11/28/2017   IR RADIOLOGIST EVAL & MGMT  12/12/2017   IR RADIOLOGIST EVAL & MGMT  08/07/2019   IR RADIOLOGIST EVAL & MGMT  10/15/2019   IR SINUS/FIST TUBE CHK-NON GI  12/26/2017   IR US GUIDE VASC ACCESS LEFT  12/03/2019   IR US GUIDE VASC ACCESS RIGHT  12/03/2019   MYOMECTOMY      Family History  Problem Relation Age of Onset   Diabetes Paternal Grandmother    Heart disease Neg Hx    Hypertension Neg Hx    Cancer Neg Hx        breast or colon cancer    Social History   Socioeconomic History   Marital status: Married    Spouse name: Not on file   Number of children: 1   Years of education: Not on file   Highest education level: Not on file  Occupational History   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  Tobacco Use   Smoking status: Never   Smokeless tobacco: Never  Vaping Use   Vaping Use: Never used  Substance and Sexual Activity   Alcohol use: Yes    Comment:  rare   Drug use: No   Sexual activity: Yes    Partners: Male    Birth control/protection: None  Other Topics Concern   Not on file  Social History Narrative   Not on file   Social Determinants of Health   Financial Resource Strain: Not on file  Food Insecurity: Not on file  Transportation Needs: Not on file  Physical Activity: Not on file  Stress: Not on file  Social Connections: Not on file  Intimate Partner Violence: Not on file   Review of Systems  Constitutional:        Concerned about weight gain Trying to walk Wears seat belt  HENT:  Negative for dental problem, hearing loss and tinnitus.        Keeps up with dentist  Eyes:  Negative for visual disturbance.       No diplopia or  unilateral vision loss  Respiratory:  Negative for cough, chest tightness and shortness of breath.   Cardiovascular:  Positive for leg swelling. Negative for chest pain and palpitations.  Gastrointestinal:  Negative for abdominal pain, blood in stool and constipation.       No heartburn  Endocrine: Negative for polydipsia and polyuria.  Genitourinary:  Negative for dyspareunia, dysuria and hematuria.       Will have intermittent spotting at times--no true periods  Allergic/Immunologic: Positive for environmental allergies. Negative for immunocompromised state.       Uses flonase prn  Neurological:  Negative for dizziness, syncope, light-headedness and headaches.  Hematological:  Negative for adenopathy. Does not bruise/bleed easily.  Psychiatric/Behavioral:  Negative for dysphoric mood and sleep disturbance. The patient is not nervous/anxious.       Objective:   Physical Exam Constitutional:      Appearance: Normal appearance.  HENT:     Right Ear: Tympanic membrane and ear canal normal.     Left Ear: Tympanic membrane and ear canal normal.     Mouth/Throat:     Pharynx: No oropharyngeal exudate or posterior oropharyngeal erythema.  Eyes:     Conjunctiva/sclera: Conjunctivae normal.     Pupils: Pupils are equal, round, and reactive to light.  Cardiovascular:     Rate and Rhythm: Normal rate and regular rhythm.     Heart sounds: No murmur heard.   No gallop.  Pulmonary:     Effort: Pulmonary effort is normal.     Breath sounds: Normal breath sounds. No wheezing or rales.  Abdominal:     Palpations: Abdomen is soft.     Tenderness: There is no abdominal tenderness.  Musculoskeletal:     Cervical back: Neck supple.     Comments: Slight calf edema  Lymphadenopathy:     Cervical: No cervical adenopathy.  Skin:    Comments: Stasis changes L>>R calf/foot No ulcers  Neurological:     General: No focal deficit present.     Mental Status: She is alert and oriented to person,  place, and time.  Psychiatric:        Mood and Affect: Mood normal.        Behavior: Behavior normal.           Assessment & Plan:

## 2021-02-07 NOTE — Assessment & Plan Note (Signed)
Healthy Discussed fitness and DASH eating Recommended COVID booster soon Flu vaccine in the fall Mammogram yearly Pap per gyn [facility]

## 2021-02-08 LAB — CBC
HCT: 44.4 % (ref 36.0–46.0)
Hemoglobin: 15 g/dL (ref 12.0–15.0)
MCHC: 33.8 g/dL (ref 30.0–36.0)
MCV: 82.6 fl (ref 78.0–100.0)
Platelets: 230 10*3/uL (ref 150.0–400.0)
RBC: 5.37 Mil/uL — ABNORMAL HIGH (ref 3.87–5.11)
RDW: 13.8 % (ref 11.5–15.5)
WBC: 5.4 10*3/uL (ref 4.0–10.5)

## 2021-02-08 LAB — COMPREHENSIVE METABOLIC PANEL
ALT: 15 U/L (ref 0–35)
AST: 14 U/L (ref 0–37)
Albumin: 4.2 g/dL (ref 3.5–5.2)
Alkaline Phosphatase: 74 U/L (ref 39–117)
BUN: 9 mg/dL (ref 6–23)
CO2: 25 mEq/L (ref 19–32)
Calcium: 9.5 mg/dL (ref 8.4–10.5)
Chloride: 105 mEq/L (ref 96–112)
Creatinine, Ser: 0.84 mg/dL (ref 0.40–1.20)
GFR: 83.49 mL/min (ref >60.00)
Glucose, Bld: 130 mg/dL — ABNORMAL HIGH (ref 70–99)
Potassium: 3.5 mEq/L (ref 3.5–5.1)
Sodium: 139 mEq/L (ref 135–145)
Total Bilirubin: 0.3 mg/dL (ref 0.2–1.2)
Total Protein: 7.7 g/dL (ref 6.0–8.3)

## 2021-02-14 ENCOUNTER — Other Ambulatory Visit (INDEPENDENT_AMBULATORY_CARE_PROVIDER_SITE_OTHER): Payer: BC Managed Care – PPO

## 2021-02-14 DIAGNOSIS — Z1211 Encounter for screening for malignant neoplasm of colon: Secondary | ICD-10-CM

## 2021-02-14 LAB — FECAL OCCULT BLOOD, IMMUNOCHEMICAL: Fecal Occult Bld: NEGATIVE

## 2021-03-23 ENCOUNTER — Other Ambulatory Visit: Payer: Self-pay

## 2021-03-23 ENCOUNTER — Ambulatory Visit: Payer: BC Managed Care – PPO | Admitting: Family Medicine

## 2021-03-23 ENCOUNTER — Encounter: Payer: Self-pay | Admitting: Family Medicine

## 2021-03-23 VITALS — BP 124/82 | HR 99 | Temp 97.9°F | Ht 68.75 in | Wt 245.0 lb

## 2021-03-23 DIAGNOSIS — R2231 Localized swelling, mass and lump, right upper limb: Secondary | ICD-10-CM | POA: Insufficient documentation

## 2021-03-23 NOTE — Patient Instructions (Signed)
Possible lipoma under right arm, possible lymph node.  I have ordered a ultrasound for the right axilla.  Let us know if enlarging, becoming painful, or coming to a head or draining.

## 2021-03-23 NOTE — Assessment & Plan Note (Signed)
Newly noted a week ago.  Not consistent with abscess as no fluctuance or tenderness, not consistent with cyst as no pore/opening noted to skin.  Denies breast concerns.  ?LAD vs lipoma - will check R axillary/breast US.

## 2021-03-23 NOTE — Progress Notes (Signed)
Patient ID: [name], female    DOB: [birth year], 45 y.o.   MRN: [id number]  This visit was conducted in person.  BP 124/82   Pulse 99   Temp 97.9 F (36.6 C) (Temporal)   Ht 5' 8.75" (1.746 m)   Wt 245 lb (111.1 kg)   SpO2 100%   BMI 36.44 kg/m    CC: lump to R axilla Subjective:   HPI: [name] is a 45 y.o. female presenting on 03/23/2021 for Mass (C/o lump in right axilla.  Noticed 03/16/21.  Area is tender and itchy. )   1 wk h/o itchy lump to R axilla. No red streaks, draining or open lesions to skin. No h/o lumps to axilla or infection, no h/o skin infections. Hasn't noticed new lumps or tender areas to breasts. No fevers/chills.  Denies inciting trauma/injury or falls.  Hasn't tried anything for this yet.   [facility]  H/o dense breasts.  H/o antiphospholipid syndrome and h/o DVT on xarelto.      Relevant past medical, surgical, family and social history reviewed and updated as indicated. Interim medical history since our last visit reviewed. Allergies and medications reviewed and updated. Outpatient Medications Prior to Visit  Medication Sig Dispense Refill   furosemide (LASIX) 40 MG tablet TAKE 1 TABLET BY MOUTH DAILY AS NEEDED 90 tablet 0   norethindrone (AYGESTIN) 5 MG tablet TAKE ONE TABLET 15MG ONCE PER DAY UNTIL BLEEDING STOPS. ONCE BLEEDING STOPS CAN GO BACK 10 MG. 180 tablet 5   XARELTO 20 MG TABS tablet TAKE 1 TABLET (20 MG TOTAL) BY MOUTH DAILY WITH SUPPER. 90 tablet 3   No facility-administered medications prior to visit.     Per HPI unless specifically indicated in ROS section below Review of Systems  Objective:  BP 124/82   Pulse 99   Temp 97.9 F (36.6 C) (Temporal)   Ht 5' 8.75" (1.746 m)   Wt 245 lb (111.1 kg)   SpO2 100%   BMI 36.44 kg/m   Wt Readings from Last 3 Encounters:  03/23/21 245 lb (111.1 kg)  02/07/21 249 lb (112.9 kg)  06/11/20 240 lb (108.9 kg)      Physical  Exam Vitals and nursing note reviewed.  Constitutional:      Appearance: Normal appearance. She is not ill-appearing.  Chest:  Breasts:    Right: No axillary adenopathy or supraclavicular adenopathy.     Left: No supraclavicular adenopathy.  Lymphadenopathy:     Head:     Right side of head: No submental, submandibular, tonsillar, preauricular or posterior auricular adenopathy.     Left side of head: No submental, submandibular, preauricular or posterior auricular adenopathy.     Cervical: No cervical adenopathy.     Right cervical: No superficial, deep or posterior cervical adenopathy.    Left cervical: No superficial, deep or posterior cervical adenopathy.     Upper Body:     Right upper body: No supraclavicular or axillary adenopathy.     Left upper body: No supraclavicular adenopathy.  Skin:    General: Skin is warm and dry.     Findings: No erythema or rash.     Comments: Firm mobile lump ~1-2cm to R axilla without fluctuance or surrounding erythema or tenderness  Neurological:     Mental Status: She is alert.       Assessment & Plan:  This visit occurred during the SARS-CoV-2 public health emergency.  Safety protocols  were in place, including screening questions prior to the visit, additional usage of staff PPE, and extensive cleaning of exam room while observing appropriate contact time as indicated for disinfecting solutions.   Problem List Items Addressed This Visit     Mass of right axilla - Primary    Newly noted a week ago.  Not consistent with abscess as no fluctuance or tenderness, not consistent with cyst as no pore/opening noted to skin.  Denies breast concerns.  ?LAD vs lipoma - will check R axillary/breast US.        Relevant Orders   US BREAST LTD UNI RIGHT INC AXILLA     No orders of the defined types were placed in this encounter.  Orders Placed This Encounter  Procedures   US BREAST LTD UNI RIGHT INC AXILLA    Standing Status:   Future     Standing Expiration Date:   03/23/2022    Order Specific Question:   Reason for Exam (SYMPTOM  OR DIAGNOSIS REQUIRED)    Answer:   R axilla mass, new    Order Specific Question:   Preferred imaging location?    Answer:   [facility]    Patient Instructions  Possible lipoma under right arm, possible lymph node.  I have ordered a ultrasound for the right axilla.  Let us know if enlarging, becoming painful, or coming to a head or draining.   Follow up plan: Return if symptoms worsen or fail to improve.  Ria Bush, MD

## 2021-04-05 ENCOUNTER — Other Ambulatory Visit: Payer: Self-pay | Admitting: Family Medicine

## 2021-04-05 DIAGNOSIS — R2231 Localized swelling, mass and lump, right upper limb: Secondary | ICD-10-CM

## 2021-04-08 ENCOUNTER — Other Ambulatory Visit: Payer: BC Managed Care – PPO

## 2021-04-15 ENCOUNTER — Ambulatory Visit
Admission: RE | Admit: 2021-04-15 | Discharge: 2021-04-15 | Disposition: A | Discharge status: Home / Self Care | Payer: BC Managed Care – PPO | Source: Ambulatory Visit | Attending: Family Medicine | Admitting: Family Medicine

## 2021-04-15 ENCOUNTER — Other Ambulatory Visit: Payer: Self-pay

## 2021-04-15 DIAGNOSIS — R2231 Localized swelling, mass and lump, right upper limb: Secondary | ICD-10-CM

## 2021-05-06 ENCOUNTER — Other Ambulatory Visit: Payer: Self-pay | Admitting: Internal Medicine

## 2021-08-05 ENCOUNTER — Ambulatory Visit: Payer: BC Managed Care – PPO | Admitting: Podiatry

## 2021-08-05 ENCOUNTER — Other Ambulatory Visit: Payer: Self-pay

## 2021-08-05 ENCOUNTER — Encounter: Payer: Self-pay | Admitting: Podiatry

## 2021-08-05 DIAGNOSIS — M79675 Pain in left toe(s): Secondary | ICD-10-CM

## 2021-08-05 DIAGNOSIS — L600 Ingrowing nail: Secondary | ICD-10-CM | POA: Diagnosis not present

## 2021-08-05 MED ORDER — CEPHALEXIN 500 MG PO CAPS: 500.0000 mg | ORAL_CAPSULE | Freq: Three times a day (TID) | ORAL | 0 refills | Status: DC

## 2021-08-05 NOTE — Patient Instructions (Signed)

## 2021-08-06 ENCOUNTER — Other Ambulatory Visit: Payer: Self-pay | Admitting: Obstetrics & Gynecology

## 2021-08-06 DIAGNOSIS — D259 Leiomyoma of uterus, unspecified: Secondary | ICD-10-CM

## 2021-08-08 NOTE — Progress Notes (Signed)
Subjective:   Patient ID: [name], female   DOB: 45 y.o.   MRN: [id number]   HPI 45 year old female presents the office today for concerns of ingrown toenail left big toe, lateral aspect.  This is been on for about 1 week.  She has had some drainage the area is tender with pressure.  There is also been swollen.  No recent treatment.  She has no other concerns today.   Review of Systems  All other systems reviewed and are negative.  Past Medical History:  Diagnosis Date   Anemia 09/2017   REQUIRING A TRANSFUSION   Antiphospholipid syndrome (HCC)    Breast cyst    BILATERAL   DVT (deep venous thrombosis) ([place])    Dysfunctional uterine bleeding    Fibroids    Obesity     Past Surgical History:  Procedure Laterality Date   CESAREAN SECTION     DILITATION & CURRETTAGE/HYSTROSCOPY WITH HYDROTHERMAL ABLATION N/A 10/08/2014   Procedure: DILATATION & CURETTAGE/HYSTEROSCOPY WITH HYDROTHERMAL ABLATION;  Surgeon: Osborne Oman, MD;  Location: [facility];  Service: Gynecology;  Laterality: N/A;   IR ANGIOGRAM PELVIS SELECTIVE OR SUPRASELECTIVE  12/03/2019   IR ANGIOGRAM PELVIS SELECTIVE OR SUPRASELECTIVE  12/03/2019   IR ANGIOGRAM SELECTIVE EACH ADDITIONAL VESSEL  12/03/2019   IR ANGIOGRAM SELECTIVE EACH ADDITIONAL VESSEL  12/03/2019   IR EMBO TUMOR ORGAN ISCHEMIA INFARCT INC GUIDE ROADMAPPING  12/03/2019   IR RADIOLOGIST EVAL & MGMT  10/10/2017   IR RADIOLOGIST EVAL & MGMT  11/28/2017   IR RADIOLOGIST EVAL & MGMT  12/12/2017   IR RADIOLOGIST EVAL & MGMT  08/07/2019   IR RADIOLOGIST EVAL & MGMT  10/15/2019   IR SINUS/FIST TUBE CHK-NON GI  12/26/2017   IR US GUIDE VASC ACCESS LEFT  12/03/2019   IR US GUIDE VASC ACCESS RIGHT  12/03/2019   MYOMECTOMY       Current Outpatient Medications:    cephALEXin (KEFLEX) 500 MG capsule, Take 1 capsule (500 mg total) by mouth 3 (three) times daily., Disp: 21 capsule, Rfl: 0   furosemide (LASIX) 40 MG tablet, TAKE 1 TABLET BY MOUTH DAILY AS NEEDED, Disp:  90 tablet, Rfl: 0   norethindrone (AYGESTIN) 5 MG tablet, TAKE ONE TABLET 15MG ONCE PER DAY UNTIL BLEEDING STOPS. ONCE BLEEDING STOPS CAN GO BACK 10 MG., Disp: 180 tablet, Rfl: 5   silver sulfADIAZINE (SILVADENE) 1 % cream, APPLY TO AFFECTED AREA EVERY DAY, Disp: 50 g, Rfl: 0   XARELTO 20 MG TABS tablet, TAKE 1 TABLET (20 MG TOTAL) BY MOUTH DAILY WITH SUPPER., Disp: 90 tablet, Rfl: 3  No Known Allergies        Objective:  Physical Exam  General: AAO x3, NAD  Dermatological: Incurvation present along the left lateral hallux nail border with localized edema and erythema.  There is macerated tissue along the nail border as well as minimal clear drainage.  There is no ascending cellulitis.  There is no fluctuation or crepitation.  No abscess formation I can identify today.  Vascular: Dorsalis Pedis artery and Posterior Tibial artery pedal pulses are 2/4 bilateral with immedate capillary fill time. There is no pain with calf compression, swelling, warmth, erythema.   Neruologic: Grossly intact via light touch bilateral.   Musculoskeletal: Tenderness palpation on the left lateral hallux ingrown toenail but no other areas of discomfort.  Muscular strength 5/5 in all groups tested bilateral.  Gait: Unassisted, Nonantalgic.       Assessment:   Left lateral  hallux ingrown toenail     Plan:  -Treatment options discussed including all alternatives, risks, and complications -Etiology of symptoms were discussed -At this time, recommended partial nail removal without chemical matricectomy to the left lateral due to infection. Risks and complications were discussed with the patient for which they understand and  verbally consent to the procedure. Under sterile conditions a total of 3 mL of a mixture of 2% lidocaine plain and 0.5% Marcaine plain was infiltrated in a hallux block fashion. Once anesthetized, the skin was prepped in sterile fashion. A tourniquet was then applied. Next the lateral  border of the hallux nail border was sharply excised making sure to remove the entire offending nail border. Once the nail was removed, the area was debrided and the underlying skin was intact. The area was irrigated and hemostasis was obtained.  A dry sterile dressing was applied. After application of the dressing the tourniquet was removed and there is found to be an immediate capillary refill time to the digit. The patient tolerated the procedure well any complications. Post procedure instructions were discussed the patient for which he verbally understood. Follow-up in one week for nail check or sooner if any problems are to arise. Discussed signs/symptoms of worsening infection and directed to call the office immediately should any occur or go directly to the emergency room. In the meantime, encouraged to call the office with any questions, concerns, changes symptoms. -Keflex  Return in about 2 weeks (around 08/19/2021) for nail check .  Trula Slade DPM

## 2021-08-11 ENCOUNTER — Encounter: Payer: Self-pay | Admitting: Obstetrics & Gynecology

## 2021-08-30 ENCOUNTER — Other Ambulatory Visit: Payer: Self-pay | Admitting: Internal Medicine

## 2021-09-20 ENCOUNTER — Ambulatory Visit (INDEPENDENT_AMBULATORY_CARE_PROVIDER_SITE_OTHER): Payer: BC Managed Care – PPO | Admitting: Obstetrics & Gynecology

## 2021-09-20 ENCOUNTER — Other Ambulatory Visit: Payer: Self-pay

## 2021-09-20 ENCOUNTER — Other Ambulatory Visit (HOSPITAL_COMMUNITY)
Admission: RE | Admit: 2021-09-20 | Discharge: 2021-09-20 | Disposition: A | Discharge status: Home / Self Care | Payer: BC Managed Care – PPO | Source: Ambulatory Visit | Attending: Obstetrics & Gynecology | Admitting: Obstetrics & Gynecology

## 2021-09-20 ENCOUNTER — Encounter: Payer: Self-pay | Admitting: Obstetrics & Gynecology

## 2021-09-20 VITALS — BP 134/92 | HR 91 | Ht 69.0 in | Wt 244.0 lb

## 2021-09-20 DIAGNOSIS — Z1211 Encounter for screening for malignant neoplasm of colon: Secondary | ICD-10-CM | POA: Diagnosis not present

## 2021-09-20 DIAGNOSIS — Z01419 Encounter for gynecological examination (general) (routine) without abnormal findings: Secondary | ICD-10-CM

## 2021-09-20 DIAGNOSIS — Z1231 Encounter for screening mammogram for malignant neoplasm of breast: Secondary | ICD-10-CM | POA: Diagnosis not present

## 2021-09-20 NOTE — Progress Notes (Signed)
GYNECOLOGY ANNUAL PREVENTATIVE CARE ENCOUNTER NOTE  History:     [name] is a 46 y.o. G51P0001 female here for a routine annual gynecologic exam.  Current complaints: none.  She is s/p bilateral uterine artery embolization on 12/03/2019 for fibroids and DUB, bleeding is much improved especially since she continued Aygestin 10 mg daily. Aygestin also continued to suppress bleeding in the setting of her long term anticoagulation use given her history of recurrent DVT.   Denies abnormal vaginal bleeding, discharge, pelvic pain, problems with intercourse or other gynecologic concerns.    Gynecologic History No LMP recorded. Patient has had an ablation. Contraception: none Last Pap: 07/23/2018. Result was normal with negative HPV Last Mammogram: 11/19/2020.  Result was normal Last Colonoscopy: Never had one but had negative fecal occult blood tests for last two years.  Obstetric History OB History  Gravida Para Term Preterm AB Living  1 1 0 0 0 1  SAB IAB Ectopic Multiple Live Births  0 0 0 0 1    # Outcome Date GA Lbr Len/2nd Weight Sex Delivery Anes PTL Lv  1 Para 03/28/07    F CS-Classical   LIV    Past Medical History:  Diagnosis Date   Anemia 09/2017   REQUIRING A TRANSFUSION   Antiphospholipid syndrome (HCC)    Breast cyst    BILATERAL   DVT (deep venous thrombosis) (HCC)    Dysfunctional uterine bleeding    Fibroids    Obesity     Past Surgical History:  Procedure Laterality Date   CESAREAN SECTION     DILITATION & CURRETTAGE/HYSTROSCOPY WITH HYDROTHERMAL ABLATION N/A 10/08/2014   Procedure: DILATATION & CURETTAGE/HYSTEROSCOPY WITH HYDROTHERMAL ABLATION;  Surgeon: Osborne Oman, MD;  Location: [facility];  Service: Gynecology;  Laterality: N/A;   IR ANGIOGRAM PELVIS SELECTIVE OR SUPRASELECTIVE  12/03/2019   IR ANGIOGRAM PELVIS SELECTIVE OR SUPRASELECTIVE  12/03/2019   IR ANGIOGRAM SELECTIVE EACH ADDITIONAL VESSEL  12/03/2019   IR ANGIOGRAM SELECTIVE EACH  ADDITIONAL VESSEL  12/03/2019   IR EMBO TUMOR ORGAN ISCHEMIA INFARCT INC GUIDE ROADMAPPING  12/03/2019   IR RADIOLOGIST EVAL & MGMT  10/10/2017   IR RADIOLOGIST EVAL & MGMT  11/28/2017   IR RADIOLOGIST EVAL & MGMT  12/12/2017   IR RADIOLOGIST EVAL & MGMT  08/07/2019   IR RADIOLOGIST EVAL & MGMT  10/15/2019   IR SINUS/FIST TUBE CHK-NON GI  12/26/2017   IR US GUIDE VASC ACCESS LEFT  12/03/2019   IR US GUIDE VASC ACCESS RIGHT  12/03/2019   MYOMECTOMY      Current Outpatient Medications on File Prior to Visit  Medication Sig Dispense Refill   norethindrone (AYGESTIN) 5 MG tablet TAKE 15MG ONCE PER DAY UNTIL BLEEDING STOPS. ONCE BLEEDING STOPS CAN GO BACK 10 MG. 180 tablet 0   XARELTO 20 MG TABS tablet TAKE 1 TABLET BY MOUTH DAILY WITH SUPPER. 90 tablet 3   furosemide (LASIX) 40 MG tablet TAKE 1 TABLET BY MOUTH DAILY AS NEEDED (Patient not taking: Reported on 09/20/2021) 90 tablet 0   silver sulfADIAZINE (SILVADENE) 1 % cream APPLY TO AFFECTED AREA EVERY DAY 50 g 0   No current facility-administered medications on file prior to visit.    No Known Allergies  Social History:  reports that she has never smoked. She has never used smokeless tobacco. She reports current alcohol use. She reports that she does not use drugs.  Family History  Problem Relation Age of Onset   Diabetes  Paternal Grandmother    Heart disease Neg Hx    Hypertension Neg Hx    Cancer Neg Hx        breast or colon cancer    The following portions of the patient's history were reviewed and updated as appropriate: allergies, current medications, past family history, past medical history, past social history, past surgical history and problem list.  Review of Systems Pertinent items noted in HPI and remainder of comprehensive ROS otherwise negative.  Physical Exam:  BP (!) 134/92    Pulse 91    Ht 5' 9"  (1.753 m)    Wt 244 lb (110.7 kg)    BMI 36.03 kg/m  CONSTITUTIONAL: Well-developed, well-nourished female in no acute distress.   HENT:  Normocephalic, atraumatic, External right and left ear normal.  EYES: Conjunctivae and EOM are normal. Pupils are equal, round, and reactive to light. No scleral icterus.  NECK: Normal range of motion, supple, no masses.  Normal thyroid.  SKIN: Skin is warm and dry. No rash noted. Not diaphoretic. No erythema. No pallor. MUSCULOSKELETAL: Normal range of motion. No tenderness.  No cyanosis, clubbing, or edema. NEUROLOGIC: Alert and oriented to person, place, and time. Normal reflexes, muscle tone coordination.  PSYCHIATRIC: Normal mood and affect. Normal behavior. Normal judgment and thought content. CARDIOVASCULAR: Normal heart rate noted, regular rhythm RESPIRATORY: Clear to auscultation bilaterally. Effort and breath sounds normal, no problems with respiration noted. BREASTS: Symmetric in size. No masses, tenderness, skin changes, nipple drainage, or lymphadenopathy bilaterally. Performed in the presence of a chaperone. ABDOMEN: Soft, no distention noted.  No tenderness, rebound or guarding.  PELVIC: Normal appearing external genitalia and urethral meatus; normal appearing vaginal mucosa and cervix.  No abnormal vaginal discharge noted.  Pap smear obtained.  Normal uterine size, no other palpable masses, no uterine or adnexal tenderness.  Performed in the presence of a chaperone.   Assessment and Plan:     1. Breast cancer screening by mammogram Mammogram scheduled - MM 3D SCREEN BREAST BILATERAL; Future  2. Colon cancer screening Colon cancer screening recommended, discussed Cologuard vs Colonoscopy details. She desired Cologuard, this was ordered. - Cologuard  3. Well woman exam with routine gynecological exam - Cytology - PAP Will follow up results of pap smear and manage accordingly. Continue Aygestin 10 mg daily for control of bleeding, already has enough refills.  Routine preventative health maintenance measures emphasized. Please refer to After Visit Summary for other  counseling recommendations.      Verita Schneiders, MD, [place] for [employer], [place]

## 2021-09-21 LAB — CYTOLOGY - PAP
Comment: NEGATIVE
Diagnosis: NEGATIVE
High risk HPV: NEGATIVE

## 2021-10-07 LAB — COLOGUARD: COLOGUARD: POSITIVE — AB

## 2021-10-11 ENCOUNTER — Telehealth: Payer: Self-pay | Admitting: *Deleted

## 2021-10-11 ENCOUNTER — Other Ambulatory Visit: Payer: Self-pay | Admitting: *Deleted

## 2021-10-11 DIAGNOSIS — Z1211 Encounter for screening for malignant neoplasm of colon: Secondary | ICD-10-CM

## 2021-10-11 NOTE — Telephone Encounter (Signed)
Called pt to discuss referral to a GI, pt verbalizes and referral will be placed

## 2021-10-11 NOTE — Telephone Encounter (Signed)
-----   Message from Osborne Oman, MD sent at 10/10/2021  8:15 AM EST ----- Patient needs colonoscopy given this abnormal Cologuard result.  Needs referral to Gastroenterologist of her choice.  Thank you!

## 2021-11-03 ENCOUNTER — Encounter: Payer: Self-pay | Admitting: Gastroenterology

## 2021-11-08 ENCOUNTER — Other Ambulatory Visit: Payer: Self-pay | Admitting: Obstetrics & Gynecology

## 2021-11-08 DIAGNOSIS — D259 Leiomyoma of uterus, unspecified: Secondary | ICD-10-CM

## 2021-11-21 ENCOUNTER — Ambulatory Visit
Admission: RE | Admit: 2021-11-21 | Discharge: 2021-11-21 | Disposition: A | Discharge status: Home / Self Care | Payer: BC Managed Care – PPO | Source: Ambulatory Visit | Attending: Obstetrics & Gynecology | Admitting: Obstetrics & Gynecology

## 2021-11-21 DIAGNOSIS — Z1231 Encounter for screening mammogram for malignant neoplasm of breast: Secondary | ICD-10-CM

## 2021-11-22 ENCOUNTER — Telehealth: Payer: Self-pay | Admitting: *Deleted

## 2021-11-22 ENCOUNTER — Encounter: Payer: Self-pay | Admitting: Gastroenterology

## 2021-11-22 ENCOUNTER — Ambulatory Visit: Payer: BC Managed Care – PPO | Admitting: Gastroenterology

## 2021-11-22 ENCOUNTER — Telehealth: Payer: Self-pay | Admitting: Gastroenterology

## 2021-11-22 VITALS — BP 138/90 | HR 106 | Ht 69.0 in | Wt 239.8 lb

## 2021-11-22 DIAGNOSIS — Z7901 Long term (current) use of anticoagulants: Secondary | ICD-10-CM

## 2021-11-22 DIAGNOSIS — R195 Other fecal abnormalities: Secondary | ICD-10-CM | POA: Diagnosis not present

## 2021-11-22 MED ORDER — NA SULFATE-K SULFATE-MG SULF 17.5-3.13-1.6 GM/177ML PO SOLN
1.0000 | Freq: Once | ORAL | 0 refills | Status: AC
Start: 2021-11-22 — End: 2021-11-22

## 2021-11-22 NOTE — Telephone Encounter (Signed)
Work note sent via [employer]. ?

## 2021-11-22 NOTE — Progress Notes (Signed)
? ?Referring Provider: Osborne Oman, MD ?Primary Care Physician:  Venia Carbon, MD ? ?Reason for Consultation:  Cologuard +  ? ? ?IMPRESSION:  ?Cologuard + ?Chronic Xarelto ? 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 As I will be out of town, we will try to schedule this colonoscopy with one of my associates.  ? ?I have recommended holding Xarelto for 2 days before endoscopy.  I discussed with the patient that there is a low, but real, risk of a recurrent embolism/thrombus, cardiovascular event such as heart attack, or stroke, while off Xarelto. Will communicate by phone or EMR with patient's prescribing provider to confirm that holding the Xarelto is appropriate at this time. The patient has appropriate anxiety about being off her Xarelto given her history.  ? ? ?PLAN: ?Colonoscopy after Xarelto washout (Dr. Nonah Mattes prescribes the Xarelto) ? ? ?HPI: [name] is a 46 y.o. female referred by Dr. Harolyn Rutherford for further evaluation of a positive Cologuard.  ? 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  ? ?No symptoms associated with the + cologuard.  No overt GI blood loss. No melena, hematochezia, bright red blood per rectum. No epistaxis, vaginal bleeding, hemoptysis, or hematuria.   ? ?She has some mild post-prandial bloating. GI ROS is otherwise negative.  ? ?No prior endoscopy or colon cancer screening.  ? ?There is no known family history of colon cancer or polyps. No family history of stomach cancer or other GI malignancy. No family history of inflammatory bowel disease or celiac.  ? ? ?Past Medical History:  ?Diagnosis Date  ? Anemia 09/2017  ? REQUIRING A TRANSFUSION  ? Antiphospholipid syndrome ([place])   ? Breast cyst   ? BILATERAL  ? DVT (deep venous thrombosis) ([facility])   ? Dysfunctional uterine bleeding   ? Fibroids    ? Obesity   ? ? ?Past Surgical History:  ?Procedure Laterality Date  ? CESAREAN SECTION    ? DILITATION & CURRETTAGE/HYSTROSCOPY WITH HYDROTHERMAL ABLATION N/A 10/08/2014  ? Procedure: DILATATION & CURETTAGE/HYSTEROSCOPY WITH HYDROTHERMAL ABLATION;  Surgeon: Osborne Oman, MD;  Location: [facility];  Service: Gynecology;  Laterality: N/A;  ? IR ANGIOGRAM PELVIS SELECTIVE OR SUPRASELECTIVE  12/03/2019  ? IR ANGIOGRAM PELVIS SELECTIVE OR SUPRASELECTIVE  12/03/2019  ? IR ANGIOGRAM SELECTIVE EACH ADDITIONAL VESSEL  12/03/2019  ? IR ANGIOGRAM SELECTIVE EACH ADDITIONAL VESSEL  12/03/2019  ? IR EMBO TUMOR ORGAN ISCHEMIA INFARCT INC GUIDE ROADMAPPING  12/03/2019  ? IR RADIOLOGIST EVAL & MGMT  10/10/2017  ? IR RADIOLOGIST EVAL & MGMT  11/28/2017  ? IR RADIOLOGIST EVAL & MGMT  12/12/2017  ? IR RADIOLOGIST EVAL & MGMT  08/07/2019  ? IR RADIOLOGIST EVAL & MGMT  10/15/2019  ? IR SINUS/FIST TUBE CHK-NON GI  12/26/2017  ? IR US GUIDE VASC ACCESS LEFT  12/03/2019  ? IR US GUIDE VASC ACCESS RIGHT  12/03/2019  ? MYOMECTOMY    ? ? ? ?Current Outpatient Medications  ?Medication Sig Dispense Refill  ? norethindrone (AYGESTIN) 5 MG tablet TAKE 15MG ONCE PER DAY UNTIL BLEEDING STOPS. ONCE BLEEDING STOPS CAN GO BACK 10 MG. 180 tablet 12  ? silver sulfADIAZINE (SILVADENE) 1 % cream APPLY TO AFFECTED AREA EVERY DAY 50 g 0  ? XARELTO 20 MG TABS tablet TAKE 1 TABLET BY MOUTH DAILY WITH SUPPER. 90 tablet 3  ?  furosemide (LASIX) 40 MG tablet TAKE 1 TABLET BY MOUTH DAILY AS NEEDED (Patient not taking: Reported on 09/20/2021) 90 tablet 0  ? ?No current facility-administered medications for this visit.  ? ? ?Allergies as of 11/22/2021  ? (No Known Allergies)  ? ? ?Family History  ?Problem Relation Age of Onset  ? Diabetes Paternal Grandmother   ? Heart disease Neg Hx   ? Hypertension Neg Hx   ? Cancer Neg Hx   ?     breast or colon cancer  ? Colon polyps Neg Hx   ? Esophageal cancer Neg Hx   ? Stomach cancer Neg Hx   ? ? ?Social History  ? ?Socioeconomic History  ?  Marital status: Married  ?  Spouse name: Not on file  ? Number of children: 1  ? Years of education: Not on file  ? Highest education level: Not on file  ?Occupational History  ? 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 Smoking status: Never  ? Smokeless tobacco: Never  ?Vaping Use  ? Vaping Use: Never used  ?Substance and Sexual Activity  ? Alcohol use: Not Currently  ?  Comment: rare  ? Drug use: No  ? Sexual activity: Yes  ?  Partners: Male  ?  Birth control/protection: None  ?Other Topics Concern  ? Not on file  ?Social History Narrative  ? Not on file  ? ?Social Determinants of Health  ? ?Financial Resource Strain: Not on file  ?Food Insecurity: Not on file  ?Transportation Needs: Not on file  ?Physical Activity: Not on file  ?Stress: Not on file  ?Social Connections: Not on file  ?Intimate Partner Violence: Not on file  ? ? ?Review of Systems: ?12 system ROS is negative except as noted above.  ? ?Physical Exam: ?General:   Alert,  well-nourished, pleasant and cooperative in NAD ?Head:  Normocephalic and atraumatic. ?Eyes:  Sclera clear, no icterus.   Conjunctiva pink. ?Ears:  Normal auditory acuity. ?Nose:  No deformity, discharge,  or lesions. ?Mouth:  No deformity or lesions.   ?Neck:  Supple; no masses or thyromegaly. ?Lungs:  Clear throughout to auscultation.   No wheezes. ?Heart:  Regular rate and rhythm; no murmurs. ?Abdomen:  Soft, nontender, nondistended, normal bowel sounds, no rebound or guarding. No hepatosplenomegaly.   ?Rectal:  Deferred  ?Msk:  Symmetrical. No boney deformities ?LAD: No inguinal or umbilical LAD ?Extremities:  No clubbing or edema. ?Neurologic:  Alert and  oriented x4;  grossly nonfocal ?Skin:  Intact without significant lesions or rashes. ?Psych:  Alert and cooperative. Normal mood and affect. ? ? ? ? ? ?Jayshawn Colston L. Tarri Glenn, MD, MPH ?11/22/2021, 10:22 AM ? ? ? ?  ?

## 2021-11-22 NOTE — Telephone Encounter (Signed)
Inbound call from patient stating that she forgot to ask for a doctors note for her appointment this morning. Patient is requesting that it be sent via [place]. Please advise.  ?

## 2021-11-22 NOTE — Patient Instructions (Signed)
It was my pleasure to provide care to you today. Based on our discussion, I am providing you with my recommendations below: ? ?RECOMMENDATION(S):  ? ?COLONOSCOPY:  ? ?You have been scheduled for a colonoscopy. Please follow written instructions given to you at your visit today.  ? ?PREP:  ? ?Please pick up your prep supplies at the pharmacy within the next 1-3 days. ? ?INHALERS:  ? ?If you use inhalers (even only as needed), please bring them with you on the day of your procedure. ? ?MEDICATIONS TO HOLD: ? ?We will contact your provider to request permission for you to hold Xarelto. Once we receive a response, you will be contacted by our office. If you do not hear from our office 1 week prior to your scheduled procedure, please contact our office at [phone].  ? ?COLONOSCOPY TIPS: ? ?To reduce nausea and dehydration, stay well hydrated for 3-4 days prior to the exam.  ?To prevent skin/hemorrhoid irritation - prior to wiping, put A&Dointment or vaseline on the toilet paper. ?Keep a towel or pad on the bed.  ?BEFORE STARTING YOUR PREP, drink  64oz of clear liquids in the morning. This will help to flush the colon and will ensure you are well hydrated!!!!  ?NOTE - This is in addition to the fluids required for to complete your prep. ?Use of a flavored hard candy, such as grape Anise Salvo, can counteract some of the flavor of the prep and may prevent some nausea.  ? ? ?FOLLOW UP: ? ?After your procedure, you will receive a call from my office staff regarding my recommendation for follow up. ? ?BMI: ? ?If you are age 46 or younger, your body mass index should be between 19-25. Your Body mass index is 35.41 kg/m?Marland Kitchen If this is out of the aformentioned range listed, please consider follow up with your Primary Care Provider.  ? ?MY CHART: ? ?The [facility] providers would like to encourage you to use [facility] to communicate with providers for non-urgent requests or questions.  Due to long hold times on the telephone,  sending your provider a message by [facility] may be a faster and more efficient way to get a response.  Please allow 48 business hours for a response.  Please remember that this is for non-urgent requests.  ? ?Thank you for trusting me with your gastrointestinal care!   ? ?Thornton Park, MD, MPH ? ?

## 2021-11-23 NOTE — Telephone Encounter (Signed)
Faxed Xarelto clearance request to Dr. Silvio Pate.  ?

## 2021-11-24 ENCOUNTER — Other Ambulatory Visit: Payer: Self-pay | Admitting: Internal Medicine

## 2021-11-28 ENCOUNTER — Encounter: Payer: Self-pay | Admitting: Podiatry

## 2021-11-28 ENCOUNTER — Ambulatory Visit: Payer: BC Managed Care – PPO | Admitting: Podiatry

## 2021-11-28 DIAGNOSIS — L603 Nail dystrophy: Secondary | ICD-10-CM

## 2021-11-28 DIAGNOSIS — L600 Ingrowing nail: Secondary | ICD-10-CM | POA: Diagnosis not present

## 2021-11-28 MED ORDER — MUPIROCIN 2 % EX OINT: 1.0000 "application " | TOPICAL_OINTMENT | Freq: Two times a day (BID) | CUTANEOUS | 2 refills | Status: DC

## 2021-11-28 NOTE — Telephone Encounter (Signed)
Patient informed she can hold Xarelto 2 days prior to procedure. Patient voiced understanding.  ?

## 2021-11-28 NOTE — Telephone Encounter (Signed)
-----   Message from Venia Carbon, MD sent at 11/23/2021  1:53 PM EDT ----- ?It is okay for her to stop the xarelto for 2 days prior to her colonoscopy---and resume as soon after the procedure as possible. ?----- Message ----- ?From: Horris Latino, CMA ?Sent: 11/23/2021  10:40 AM EDT ?To: Venia Carbon, MD ? ? ?

## 2021-11-28 NOTE — Patient Instructions (Addendum)
For the left you can use tea tree oil, fungi-nail. Also I would start a biotin supplement or a vitamin for hair, skin, nails.  ? ?Soak Instructions ? ? ? ?THE DAY AFTER THE PROCEDURE ? ?Place 1/4 cup of epsom salts in a quart of warm tap water.  Submerge your foot or feet with outer bandage intact for the initial soak; this will allow the bandage to become moist and wet for easy lift off.  Once you remove your bandage, continue to soak in the solution for 20 minutes.  This soak should be done twice a day.  Next, remove your foot or feet from solution, blot dry the affected area and cover.  You may use a band aid large enough to cover the area or use gauze and tape.  Apply other medications to the area as directed by the doctor such as polysporin neosporin. ? ?IF YOUR SKIN BECOMES IRRITATED WHILE USING THESE INSTRUCTIONS, IT IS OKAY TO SWITCH TO  WHITE VINEGAR AND WATER. Or you may use antibacterial soap and water to keep the toe clean ? ?Monitor for any signs/symptoms of infection. Call the office immediately if any occur or go directly to the emergency room. Call with any questions/concerns. ? ?Ingrown Toenail ?An ingrown toenail occurs when the corner or sides of a toenail grow into the surrounding skin. This causes discomfort and pain. The big toe is most commonly affected, but any of the toes can be affected. If an ingrown toenail is not treated, it can become infected. ?What are the causes? ?This condition may be caused by: ?Wearing shoes that are too small or tight. ?An injury, such as stubbing your toe or having your toe stepped on. ?Improper cutting or care of your toenails. ?Having nail or foot abnormalities that were present from birth (congenital abnormalities), such as having a nail that is too big for your toe. ?What increases the risk? ?The following factors may make you more likely to develop ingrown toenails: ?Age. Nails tend to get thicker with age, so ingrown nails are more common among older  people. ?Cutting your toenails incorrectly, such as cutting them very short or cutting them unevenly. ?An ingrown toenail is more likely to get infected if you have: ?Diabetes. ?Blood flow (circulation) problems. ?What are the signs or symptoms? ?Symptoms of an ingrown toenail may include: ?Pain, soreness, or tenderness. ?Redness. ?Swelling. ?Hardening of the skin that surrounds the toenail. ?Signs that an ingrown toenail may be infected include: ?Fluid or pus. ?Symptoms that get worse. ?How is this diagnosed? ?Ingrown toenails may be diagnosed based on: ?Your symptoms and medical history. ?A physical exam. ?Labs or tests. If you have fluid or blood coming from your toenail, a sample may be collected to test for the specific type of bacteria that is causing the infection. ?How is this treated? ?Treatment depends on the severity of your symptoms. You may be able to care for your toenail at home. ?If you have an infection, you may be prescribed antibiotic medicines. ?If you have fluid or pus draining from your toenail, your health care provider may drain it. ?If you have trouble walking, you may be given crutches to use. ?If you have a severe or infected ingrown toenail, you may need a procedure to remove part or all of the nail. ?Follow these instructions at home: ?Foot care ? ?Check your wound every day for signs of infection, or as often as told by your health care provider. Check for: ?More redness, swelling, or  pain. ?More fluid or blood. ?Warmth. ?Pus or a bad smell. ?Do not pick at your toenail or try to remove it yourself. ?Soak your foot in warm, soapy water. Do this for 20 minutes, 3 times a day, or as often as told by your health care provider. This helps to keep your toe clean and your skin soft. ?Wear shoes that fit well and are not too tight. Your health care provider may recommend that you wear open-toed shoes while you heal. ?Trim your toenails regularly and carefully. Cut your toenails straight across  to prevent injury to the skin at the corners of the toenail. Do not cut your nails in a curved shape. ?Keep your feet clean and dry to help prevent infection. ?General instructions ?Take over-the-counter and prescription medicines only as told by your health care provider. ?If you were prescribed an antibiotic, take it as told by your health care provider. Do not stop taking the antibiotic even if you start to feel better. ?If your health care provider told you to use crutches to help you move around, use them as instructed. ?Return to your normal activities as told by your health care provider. Ask your health care provider what activities are safe for you. ?Keep all follow-up visits. This is important. ?Contact a health care provider if: ?You have more redness, swelling, pain, or other symptoms that do not improve with treatment. ?You have fluid, blood, or pus coming from your toenail. ?You have a red streak on your skin that starts at your foot and spreads up your leg. ?You have a fever. ?Summary ?An ingrown toenail occurs when the corner or sides of a toenail grow into the surrounding skin. This causes discomfort and pain. The big toe is most commonly affected, but any of the toes can be affected. ?If an ingrown toenail is not treated, it can become infected. ?Fluid or pus draining from your toenail is a sign of infection. Your health care provider may need to drain it. You may be given antibiotics to treat the infection. ?Trimming your toenails regularly and properly can help you prevent an ingrown toenail. ?This information is not intended to replace advice given to you by your health care provider. Make sure you discuss any questions you have with your health care provider. ?Document Revised: 12/14/2020 Document Reviewed: 12/14/2020 ?Elsevier Patient Education ? [place]. ? ?

## 2021-11-29 ENCOUNTER — Encounter: Payer: Self-pay | Admitting: Gastroenterology

## 2021-11-29 ENCOUNTER — Encounter: Payer: Self-pay | Admitting: Hematology and Oncology

## 2021-11-30 NOTE — Progress Notes (Signed)
Subjective: ?46 year old female presents the office today for concerns of pain in the right third toe, lateral nail border which about 3 weeks ago.  She denies any swelling to the area that was tender with pressure.  Also she present left big toenail, medial nail border removed.  She notes a mild white discoloration at the tip of the toenail to make sure that the nail is looking okay.  No pain in the left toenail.  No other concerns. ? ? ?Objective: ?AAO x3, NAD ?DP/PT pulses palpable bilaterally, CRT less than 3 seconds ?Mild incurvation present along the right third digit lateral nail border.  There is no significant drainage or pus.  Minimal edema to the distal portion of the nail border.  No purulence.  No ascending cellulitis.  No signs of infection.  On the left hallux toenail there is mild yellow, white discoloration of the tip of the toenail.  There is no edema, erythema or signs of infection to the toenail sites.  No pain with calf compression, swelling, warmth, erythema ? ?Assessment: ?Right third ingrown toenail lateral nail border; onychodystrophy left hallux ? ?Plan: ?-All treatment options discussed with the patient including all alternatives, risks, complications.  ?-Regards for right third digit toenail intraoperative to the point of any complications or bleeding.  However, he was continuing to proceed with partial nail avulsion.  Recommended antibiotic ointment dressing changes daily. ?-For the left side discussed taking oral biotin, tea tree oil or Fungi-Nail to the toenail. ?-Patient encouraged to call the office with any questions, concerns, change in symptoms.  ? ?Trula Slade DPM ? ?

## 2021-12-07 ENCOUNTER — Encounter: Payer: Self-pay | Admitting: Internal Medicine

## 2021-12-07 ENCOUNTER — Ambulatory Visit (AMBULATORY_SURGERY_CENTER): Payer: BC Managed Care – PPO | Admitting: Internal Medicine

## 2021-12-07 VITALS — BP 134/94 | HR 88 | Temp 98.3°F | Resp 17 | Ht 69.0 in | Wt 239.0 lb

## 2021-12-07 DIAGNOSIS — K648 Other hemorrhoids: Secondary | ICD-10-CM

## 2021-12-07 DIAGNOSIS — R195 Other fecal abnormalities: Secondary | ICD-10-CM

## 2021-12-07 DIAGNOSIS — K5730 Diverticulosis of large intestine without perforation or abscess without bleeding: Secondary | ICD-10-CM | POA: Diagnosis not present

## 2021-12-07 MED ORDER — SODIUM CHLORIDE 0.9 % IV SOLN: 500.0000 mL | Freq: Once | INTRAVENOUS | Status: DC

## 2021-12-07 NOTE — Op Note (Addendum)
[facility] ?Patient Name: [name] ?Procedure Date: 12/07/2021 9:56 AM ?MRN: [id number] ?Endoscopist: Sonny Masters "[name] ,  ?Age: 46 ?Referring MD:  ?Date of Birth: 01/25/1976 ?Gender: Female ?Account #: [id number] ?Procedure:                Colonoscopy ?Indications:              Positive Cologuard test ?Medicines:                Monitored Anesthesia Care ?Procedure:                Pre-Anesthesia Assessment: ?                          - Prior to the procedure, a History and Physical  ?                          was performed, and patient medications and  ?                          allergies were reviewed. The patient's tolerance of  ?                          previous anesthesia was also reviewed. The risks  ?                          and benefits of the procedure and the sedation  ?                          options and risks were discussed with the patient.  ?                          All questions were answered, and informed consent  ?                          was obtained. Prior Anticoagulants: The patient has  ?                          taken Xarelto (rivaroxaban), last dose was 2 days  ?                          prior to procedure. ASA Grade Assessment: III - A  ?                          patient with severe systemic disease. After  ?                          reviewing the risks and benefits, the patient was  ?                          deemed in satisfactory condition to undergo the  ?                          procedure. ?  After obtaining informed consent, the colonoscope  ?                          was passed under direct vision. Throughout the  ?                          procedure, the patient's blood pressure, pulse, and  ?                          oxygen saturations were monitored continuously. The  ?                          CF [id number] #[id number] was introduced through the anus  ?                          and advanced to the the terminal ileum. The  ?                           colonoscopy was performed without difficulty. The  ?                          patient tolerated the procedure well. The quality  ?                          of the bowel preparation was good. The terminal  ?                          ileum, ileocecal valve, appendiceal orifice, and  ?                          rectum were photographed. ?Scope In: 10:10:49 AM ?Scope Out: 10:31:34 AM ?Scope Withdrawal Time: 0 hours 13 minutes 37 seconds  ?Total Procedure Duration: 0 hours 20 minutes 45 seconds  ?Findings:                 The terminal ileum appeared normal. ?                          Multiple diverticula were found in the entire colon. ?                          Non-bleeding internal hemorrhoids were found during  ?                          retroflexion. ?Complications:            No immediate complications. ?Estimated Blood Loss:     Estimated blood loss: none. ?Impression:               - The examined portion of the ileum was normal. ?                          - Diverticulosis in the entire examined colon. ?                          - Non-bleeding internal hemorrhoids. ?                          -  No specimens collected. ?Recommendation:           - Discharge patient to home (with escort). ?                          - Repeat colonoscopy in 10 years for screening  ?                          purposes. ?                          - Okay to restart Xarelto today. ?                          - The findings and recommendations were discussed  ?                          with the patient. ?Georgian Co,  ?12/07/2021 10:34:33 AM ?

## 2021-12-07 NOTE — Patient Instructions (Addendum)
Handouts on diverticulosis and hemorrhoids given. ? ?Resume Xarelto at previous dose today. ? ? ? YOU HAD AN ENDOSCOPIC PROCEDURE TODAY AT [facility]:   Refer to the procedure report that was given to you for any specific questions about what was found during the examination.  If the procedure report does not answer your questions, please call your gastroenterologist to clarify.  If you requested that your care partner not be given the details of your procedure findings, then the procedure report has been included in a sealed envelope for you to review at your convenience later. ? ?YOU SHOULD EXPECT: Some feelings of bloating in the abdomen. Passage of more gas than usual.  Walking can help get rid of the air that was put into your GI tract during the procedure and reduce the bloating. If you had a lower endoscopy (such as a colonoscopy or flexible sigmoidoscopy) you may notice spotting of blood in your stool or on the toilet paper. If you underwent a bowel prep for your procedure, you may not have a normal bowel movement for a few days. ? ?Please Note:  You might notice some irritation and congestion in your nose or some drainage.  This is from the oxygen used during your procedure.  There is no need for concern and it should clear up in a day or so. ? ?SYMPTOMS TO REPORT IMMEDIATELY: ? ?Following lower endoscopy (colonoscopy or flexible sigmoidoscopy): ? Excessive amounts of blood in the stool ? Significant tenderness or worsening of abdominal pains ? Swelling of the abdomen that is new, acute ? Fever of 100?F or higher ? ? ?For urgent or emergent issues, a gastroenterologist can be reached at any hour by calling [phone]. ?Do not use MyChart messaging for urgent concerns.  ? ? ?DIET:  We do recommend a small meal at first, but then you may proceed to your regular diet.  Drink plenty of fluids but you should avoid alcoholic beverages for 24 hours. ? ?ACTIVITY:  You should plan to take it  easy for the rest of today and you should NOT DRIVE or use heavy machinery until tomorrow (because of the sedation medicines used during the test).   ? ?FOLLOW UP: ?Our staff will call the number listed on your records 48-72 hours following your procedure to check on you and address any questions or concerns that you may have regarding the information given to you following your procedure. If we do not reach you, we will leave a message.  We will attempt to reach you two times.  During this call, we will ask if you have developed any symptoms of COVID 19. If you develop any symptoms (ie: fever, flu-like symptoms, shortness of breath, cough etc.) before then, please call [phone].  If you test positive for Covid 19 in the 2 weeks post procedure, please call and report this information to [place].   ? ?If any biopsies were taken you will be contacted by phone or by letter within the next 1-3 weeks.  Please call us at [phone] if you have not heard about the biopsies in 3 weeks.  ? ? ?SIGNATURES/CONFIDENTIALITY: ?You and/or your care partner have signed paperwork which will be entered into your electronic medical record.  These signatures attest to the fact that that the information above on your After Visit Summary has been reviewed and is understood.  Full responsibility of the confidentiality of this discharge information lies with you and/or your care-partner.  ?

## 2021-12-07 NOTE — Progress Notes (Signed)
Pt non-responsive, VVS, Report to RN  ?

## 2021-12-07 NOTE — Progress Notes (Signed)
? ?GASTROENTEROLOGY PROCEDURE H&P NOTE  ? ?Primary Care Physician: ?Venia Carbon, MD ? ? ? ?Reason for Procedure:   Positive Cologuard ? ?Plan:    Colonoscopy ? ?Patient is appropriate for endoscopic procedure(s) in the ambulatory ([facility]) setting. ? ?The nature of the procedure, as well as the risks, benefits, and alternatives were carefully and thoroughly reviewed with the patient. Ample time for discussion and questions allowed. The patient understood, was satisfied, and agreed to proceed.  ? ? ? ?HPI: ?[name] is a 46 y.o. female who presents for colonoscopy for evaluation of positive Cologuard .  Patient was most recently seen in the Gastroenterology Clinic on 11/22/21.  No interval change in medical history since that appointment. Please refer to that note for full details regarding GI history and clinical presentation.  ? ?Past Medical History:  ?Diagnosis Date  ? Anemia 09/2017  ? REQUIRING A TRANSFUSION  ? Antiphospholipid syndrome ([place])   ? Breast cyst   ? BILATERAL  ? DVT (deep venous thrombosis) ([facility])   ? Dysfunctional uterine bleeding   ? Fibroids   ? Obesity   ? ? ?Past Surgical History:  ?Procedure Laterality Date  ? CESAREAN SECTION    ? DILITATION & CURRETTAGE/HYSTROSCOPY WITH HYDROTHERMAL ABLATION N/A 10/08/2014  ? Procedure: DILATATION & CURETTAGE/HYSTEROSCOPY WITH HYDROTHERMAL ABLATION;  Surgeon: Osborne Oman, MD;  Location: [facility];  Service: Gynecology;  Laterality: N/A;  ? IR ANGIOGRAM PELVIS SELECTIVE OR SUPRASELECTIVE  12/03/2019  ? IR ANGIOGRAM PELVIS SELECTIVE OR SUPRASELECTIVE  12/03/2019  ? IR ANGIOGRAM SELECTIVE EACH ADDITIONAL VESSEL  12/03/2019  ? IR ANGIOGRAM SELECTIVE EACH ADDITIONAL VESSEL  12/03/2019  ? IR EMBO TUMOR ORGAN ISCHEMIA INFARCT INC GUIDE ROADMAPPING  12/03/2019  ? IR RADIOLOGIST EVAL & MGMT  10/10/2017  ? IR RADIOLOGIST EVAL & MGMT  11/28/2017  ? IR RADIOLOGIST EVAL & MGMT  12/12/2017  ? IR RADIOLOGIST EVAL & MGMT  08/07/2019  ? IR RADIOLOGIST EVAL & MGMT   10/15/2019  ? IR SINUS/FIST TUBE CHK-NON GI  12/26/2017  ? IR US GUIDE VASC ACCESS LEFT  12/03/2019  ? IR US GUIDE VASC ACCESS RIGHT  12/03/2019  ? MYOMECTOMY    ? ? ?Prior to Admission medications   ?Medication Sig Start Date End Date Taking? Authorizing Provider  ?silver sulfADIAZINE (SILVADENE) 1 % cream APPLY TOPICALLY TO AFFECTED AREA EVERY DAY 11/25/21  Yes Viviana Simpler I, MD  ?furosemide (LASIX) 40 MG tablet TAKE 1 TABLET BY MOUTH DAILY AS NEEDED ?Patient not taking: Reported on 09/20/2021 04/26/20   Venia Carbon, MD  ?mupirocin ointment (BACTROBAN) 2 % Apply 1 application. topically 2 (two) times daily. ?Patient not taking: Reported on 12/07/2021 11/28/21   Trula Slade, DPM  ?norethindrone (AYGESTIN) 5 MG tablet TAKE 15MG ONCE PER DAY UNTIL BLEEDING STOPS. ONCE BLEEDING STOPS CAN GO BACK 10 MG. 11/08/21   Anyanwu, Sallyanne Havers, MD  ?XARELTO 20 MG TABS tablet TAKE 1 TABLET BY MOUTH DAILY WITH SUPPER. 08/30/21   Venia Carbon, MD  ? ? ?Current Outpatient Medications  ?Medication Sig Dispense Refill  ? silver sulfADIAZINE (SILVADENE) 1 % cream APPLY TOPICALLY TO AFFECTED AREA EVERY DAY 50 g 0  ? furosemide (LASIX) 40 MG tablet TAKE 1 TABLET BY MOUTH DAILY AS NEEDED (Patient not taking: Reported on 09/20/2021) 90 tablet 0  ? mupirocin ointment (BACTROBAN) 2 % Apply 1 application. topically 2 (two) times daily. (Patient not taking: Reported on 12/07/2021) 30 g 2  ? norethindrone (AYGESTIN)  5 MG tablet TAKE 15MG ONCE PER DAY UNTIL BLEEDING STOPS. ONCE BLEEDING STOPS CAN GO BACK 10 MG. 180 tablet 12  ? XARELTO 20 MG TABS tablet TAKE 1 TABLET BY MOUTH DAILY WITH SUPPER. 90 tablet 3  ? ?Current Facility-Administered Medications  ?Medication Dose Route Frequency Provider Last Rate Last Admin  ? 0.9 %  sodium chloride infusion  500 mL Intravenous Once Sharyn Creamer, MD      ? ? ?Allergies as of 12/07/2021  ? (No Known Allergies)  ? ? ?Family History  ?Problem Relation Age of Onset  ? Diabetes Paternal Grandmother   ?  Heart disease Neg Hx   ? Hypertension Neg Hx   ? Cancer Neg Hx   ?     breast or colon cancer  ? Colon polyps Neg Hx   ? Esophageal cancer Neg Hx   ? Stomach cancer Neg Hx   ? ? ?Social History  ? ?Socioeconomic History  ? Marital status: Married  ?  Spouse name: Not on file  ? Number of children: 1  ? Years of education: Not on file  ? Highest education level: Not on file  ?Occupational History  ? 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 Smoking status: Never  ? Smokeless tobacco: Never  ?Vaping Use  ? Vaping Use: Never used  ?Substance and Sexual Activity  ? Alcohol use: Not Currently  ?  Comment: rare  ? Drug use: No  ? Sexual activity: Yes  ?  Partners: Male  ?  Birth control/protection: None  ?Other Topics Concern  ? Not on file  ?Social History Narrative  ? Not on file  ? ?Social Determinants of Health  ? ?Financial Resource Strain: Not on file  ?Food Insecurity: Not on file  ?Transportation Needs: Not on file  ?Physical Activity: Not on file  ?Stress: Not on file  ?Social Connections: Not on file  ?Intimate Partner Violence: Not on file  ? ? ?Physical Exam: ?Vital signs in last 24 hours: ?BP (!) 164/88   Pulse (!) 104   Temp 98.3 ?F (36.8 ?C)   Ht 5' 9"  (1.753 m)   Wt 239 lb (108.4 kg)   SpO2 96%   BMI 35.29 kg/m?  ?GEN: NAD ?EYE: Sclerae anicteric ?ENT: MMM ?CV: Non-tachycardic ?Pulm: No increased WOB ?GI: Soft ?NEURO:  Alert & Oriented ? ? ?[name], MD ?[facility] ? ? ?12/07/2021 9:58 AM ? ?

## 2021-12-09 ENCOUNTER — Telehealth: Payer: Self-pay | Admitting: *Deleted

## 2021-12-09 ENCOUNTER — Telehealth: Payer: Self-pay

## 2021-12-09 NOTE — Telephone Encounter (Signed)
?  Follow up Call- ? ? ?  12/07/2021  ?  9:13 AM  ?Call back number  ?Post procedure Call Back phone  # [phone]  ?Permission to leave phone message Yes  ?  ? ?Patient questions: ? ?Do you have a fever, pain , or abdominal swelling? No. ?Pain Score  0 * ? ?Have you tolerated food without any problems? Yes.   ? ?Have you been able to return to your normal activities? Yes.   ? ?Do you have any questions about your discharge instructions: ?Diet   No. ?Medications  No. ?Follow up visit  No. ? ?Do you have questions or concerns about your Care? No. ? ?Actions: ?* If pain score is 4 or above: ?No action needed, pain <4. ? ? ?

## 2021-12-09 NOTE — Telephone Encounter (Signed)
First follow up call attempt.  LVM ?

## 2021-12-30 ENCOUNTER — Ambulatory Visit: Payer: BC Managed Care – PPO | Admitting: Internal Medicine

## 2021-12-30 ENCOUNTER — Encounter: Payer: Self-pay | Admitting: Internal Medicine

## 2021-12-30 DIAGNOSIS — J069 Acute upper respiratory infection, unspecified: Secondary | ICD-10-CM

## 2021-12-30 NOTE — Assessment & Plan Note (Signed)
Seems to have typical self limited viral infection ?Discussed tylenol, OTC cough syrup prn ?May want to test tomorrow again for COVID--but not sure about antivirals ?If worsens into mid next week, might consider empiric antibiotic for sinusitis ?

## 2021-12-30 NOTE — Progress Notes (Signed)
? ?Subjective:  ? ? Patient ID: [name], female    DOB: 23-May-1976, 46 y.o.   MRN: [id number] ? ?HPI ?Here due to respiratory symptoms ? ?Started feeling stuffy yesterday--then worsened at night ?Congestion, sore throat, headache ?Some cough---no SOB ?No fever ?Slight sweat/chill last night ? ?COVID test negative today ? ?Current Outpatient Medications on File Prior to Visit  ?Medication Sig Dispense Refill  ? furosemide (LASIX) 40 MG tablet TAKE 1 TABLET BY MOUTH DAILY AS NEEDED 90 tablet 0  ? mupirocin ointment (BACTROBAN) 2 % Apply 1 application. topically 2 (two) times daily. 30 g 2  ? norethindrone (AYGESTIN) 5 MG tablet TAKE 15MG ONCE PER DAY UNTIL BLEEDING STOPS. ONCE BLEEDING STOPS CAN GO BACK 10 MG. 180 tablet 12  ? silver sulfADIAZINE (SILVADENE) 1 % cream APPLY TOPICALLY TO AFFECTED AREA EVERY DAY 50 g 0  ? XARELTO 20 MG TABS tablet TAKE 1 TABLET BY MOUTH DAILY WITH SUPPER. 90 tablet 3  ? ?No current facility-administered medications on file prior to visit.  ? ? ?No Known Allergies ? ?Past Medical History:  ?Diagnosis Date  ? Anemia 09/2017  ? REQUIRING A TRANSFUSION  ? Antiphospholipid syndrome ([place])   ? Breast cyst   ? BILATERAL  ? DVT (deep venous thrombosis) ([facility])   ? Dysfunctional uterine bleeding   ? Fibroids   ? Obesity   ? ? ?Past Surgical History:  ?Procedure Laterality Date  ? CESAREAN SECTION    ? DILITATION & CURRETTAGE/HYSTROSCOPY WITH HYDROTHERMAL ABLATION N/A 10/08/2014  ? Procedure: DILATATION & CURETTAGE/HYSTEROSCOPY WITH HYDROTHERMAL ABLATION;  Surgeon: Osborne Oman, MD;  Location: [facility];  Service: Gynecology;  Laterality: N/A;  ? IR ANGIOGRAM PELVIS SELECTIVE OR SUPRASELECTIVE  12/03/2019  ? IR ANGIOGRAM PELVIS SELECTIVE OR SUPRASELECTIVE  12/03/2019  ? IR ANGIOGRAM SELECTIVE EACH ADDITIONAL VESSEL  12/03/2019  ? IR ANGIOGRAM SELECTIVE EACH ADDITIONAL VESSEL  12/03/2019  ? IR EMBO TUMOR ORGAN ISCHEMIA INFARCT INC GUIDE ROADMAPPING  12/03/2019  ? IR RADIOLOGIST EVAL & MGMT   10/10/2017  ? IR RADIOLOGIST EVAL & MGMT  11/28/2017  ? IR RADIOLOGIST EVAL & MGMT  12/12/2017  ? IR RADIOLOGIST EVAL & MGMT  08/07/2019  ? IR RADIOLOGIST EVAL & MGMT  10/15/2019  ? IR SINUS/FIST TUBE CHK-NON GI  12/26/2017  ? IR US GUIDE VASC ACCESS LEFT  12/03/2019  ? IR US GUIDE VASC ACCESS RIGHT  12/03/2019  ? MYOMECTOMY    ? ? ?Family History  ?Problem Relation Age of Onset  ? Diabetes Paternal Grandmother   ? Heart disease Neg Hx   ? Hypertension Neg Hx   ? Cancer Neg Hx   ?     breast or colon cancer  ? Colon polyps Neg Hx   ? Esophageal cancer Neg Hx   ? Stomach cancer Neg Hx   ? ? ?Social History  ? ?Socioeconomic History  ? Marital status: Married  ?  Spouse name: Not on file  ? Number of children: 1  ? Years of education: Not on file  ? Highest education level: Not on file  ?Occupational History  ? 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 Smoking status: Never  ? Smokeless tobacco: Never  ?Vaping Use  ? Vaping Use: Never used  ?Substance and Sexual Activity  ? Alcohol use: Not Currently  ?  Comment: rare  ? Drug use: No  ? Sexual activity: Yes  ?  Partners: Male  ?  Birth control/protection: None  ?Other Topics Concern  ? Not on file  ?Social History Narrative  ? Not on file  ? ?Social Determinants of Health  ? ?Financial Resource Strain: Not on file  ?Food Insecurity: Not on file  ?Transportation Needs: Not on file  ?Physical Activity: Not on file  ?Stress: Not on file  ?Social Connections: Not on file  ?Intimate Partner Violence: Not on file  ? ?Review of Systems ?No rash ?No N/V ?Appetite off but able to eat ?No change in taste or smell ?   ?Objective:  ? Physical Exam ?Constitutional:   ?   Comments: Looks a bit tired  ?HENT:  ?   Head:  ?   Comments: Mild sinus tenderness ?   Right Ear: Tympanic membrane and ear canal normal.  ?   Left Ear: Tympanic membrane and ear canal normal.  ?   Nose:  ?   Comments: Moderate inflammation--mostly on right ?   Mouth/Throat:  ?    Pharynx: No oropharyngeal exudate or posterior oropharyngeal erythema.  ?Neck:  ?   Comments: Slight non tender anterior cervical node enlargement ?Pulmonary:  ?   Effort: Pulmonary effort is normal.  ?   Breath sounds: Normal breath sounds. No wheezing or rales.  ?Musculoskeletal:  ?   Cervical back: Neck supple.  ?Neurological:  ?   Mental Status: She is alert.  ?  ? ? ? ? ?   ?Assessment & Plan:  ? ?

## 2022-02-10 ENCOUNTER — Ambulatory Visit (INDEPENDENT_AMBULATORY_CARE_PROVIDER_SITE_OTHER): Payer: BC Managed Care – PPO | Admitting: Internal Medicine

## 2022-02-10 ENCOUNTER — Encounter: Payer: Self-pay | Admitting: Internal Medicine

## 2022-02-10 VITALS — BP 118/80 | HR 83 | Temp 97.6°F | Ht 68.5 in | Wt 238.0 lb

## 2022-02-10 DIAGNOSIS — E669 Obesity, unspecified: Secondary | ICD-10-CM | POA: Diagnosis not present

## 2022-02-10 DIAGNOSIS — N938 Other specified abnormal uterine and vaginal bleeding: Secondary | ICD-10-CM | POA: Diagnosis not present

## 2022-02-10 DIAGNOSIS — Z0000 Encounter for general adult medical examination without abnormal findings: Secondary | ICD-10-CM | POA: Diagnosis not present

## 2022-02-10 DIAGNOSIS — I82409 Acute embolism and thrombosis of unspecified deep veins of unspecified lower extremity: Secondary | ICD-10-CM | POA: Diagnosis not present

## 2022-02-10 LAB — COMPREHENSIVE METABOLIC PANEL
ALT: 13 U/L (ref 0–35)
AST: 12 U/L (ref 0–37)
Albumin: 4 g/dL (ref 3.5–5.2)
Alkaline Phosphatase: 52 U/L (ref 39–117)
BUN: 9 mg/dL (ref 6–23)
CO2: 23 mEq/L (ref 19–32)
Calcium: 9.3 mg/dL (ref 8.4–10.5)
Chloride: 108 mEq/L (ref 96–112)
Creatinine, Ser: 0.79 mg/dL (ref 0.40–1.20)
GFR: 89.24 mL/min (ref >60.00)
Glucose, Bld: 95 mg/dL (ref 70–99)
Potassium: 4 mEq/L (ref 3.5–5.1)
Sodium: 140 mEq/L (ref 135–145)
Total Bilirubin: 0.4 mg/dL (ref 0.2–1.2)
Total Protein: 7.2 g/dL (ref 6.0–8.3)

## 2022-02-10 LAB — CBC
HCT: 45.8 % (ref 36.0–46.0)
Hemoglobin: 15.1 g/dL — ABNORMAL HIGH (ref 12.0–15.0)
MCHC: 32.9 g/dL (ref 30.0–36.0)
MCV: 83.2 fl (ref 78.0–100.0)
Platelets: 228 10*3/uL (ref 150.0–400.0)
RBC: 5.51 Mil/uL — ABNORMAL HIGH (ref 3.87–5.11)
RDW: 14.2 % (ref 11.5–15.5)
WBC: 4 10*3/uL (ref 4.0–10.5)

## 2022-02-10 LAB — HEMOGLOBIN A1C: Hgb A1c MFr Bld: 6.3 % (ref 4.6–6.5)

## 2022-02-10 NOTE — Assessment & Plan Note (Signed)
Controlled with ongoing xarelto 20

## 2022-02-10 NOTE — Assessment & Plan Note (Signed)
Will continue to work on low carb eating and more exercise Might consider semaglutide next year if no progress

## 2022-02-10 NOTE — Progress Notes (Signed)
Subjective:    Patient ID: [name], female    DOB: Apr 16, 1976, 46 y.o.   MRN: [id number]  HPI Here for physical  Got over cold from last month  Working on losing weight Different diets, etc---mostly low carb [facility] been [facility] Wonders about the progesterone (but no more bleeding on this)  No leg swelling Occasional left leg pain despite compression socks Does have ongoing dark areas on leg---seeing derm about this  Current Outpatient Medications on File Prior to Visit  Medication Sig Dispense Refill   furosemide (LASIX) 40 MG tablet TAKE 1 TABLET BY MOUTH DAILY AS NEEDED 90 tablet 0   mupirocin ointment (BACTROBAN) 2 % Apply 1 application. topically 2 (two) times daily. 30 g 2   norethindrone (AYGESTIN) 5 MG tablet TAKE 15MG ONCE PER DAY UNTIL BLEEDING STOPS. ONCE BLEEDING STOPS CAN GO BACK 10 MG. 180 tablet 12   silver sulfADIAZINE (SILVADENE) 1 % cream APPLY TOPICALLY TO AFFECTED AREA EVERY DAY 50 g 0   XARELTO 20 MG TABS tablet TAKE 1 TABLET BY MOUTH DAILY WITH SUPPER. 90 tablet 3   No current facility-administered medications on file prior to visit.    No Known Allergies  Past Medical History:  Diagnosis Date   Anemia 09/2017   REQUIRING A TRANSFUSION   Antiphospholipid syndrome ([facility])    Breast cyst    BILATERAL   DVT (deep venous thrombosis) ([place])    Dysfunctional uterine bleeding    Fibroids    Obesity     Past Surgical History:  Procedure Laterality Date   CESAREAN SECTION     DILITATION & CURRETTAGE/HYSTROSCOPY WITH HYDROTHERMAL ABLATION N/A 10/08/2014   Procedure: DILATATION & CURETTAGE/HYSTEROSCOPY WITH HYDROTHERMAL ABLATION;  Surgeon: Osborne Oman, MD;  Location: [facility];  Service: Gynecology;  Laterality: N/A;   IR ANGIOGRAM PELVIS SELECTIVE OR SUPRASELECTIVE  12/03/2019   IR ANGIOGRAM PELVIS SELECTIVE OR SUPRASELECTIVE  12/03/2019   IR ANGIOGRAM SELECTIVE EACH ADDITIONAL VESSEL   12/03/2019   IR ANGIOGRAM SELECTIVE EACH ADDITIONAL VESSEL  12/03/2019   IR EMBO TUMOR ORGAN ISCHEMIA INFARCT INC GUIDE ROADMAPPING  12/03/2019   IR RADIOLOGIST EVAL & MGMT  10/10/2017   IR RADIOLOGIST EVAL & MGMT  11/28/2017   IR RADIOLOGIST EVAL & MGMT  12/12/2017   IR RADIOLOGIST EVAL & MGMT  08/07/2019   IR RADIOLOGIST EVAL & MGMT  10/15/2019   IR SINUS/FIST TUBE CHK-NON GI  12/26/2017   IR US GUIDE VASC ACCESS LEFT  12/03/2019   IR US GUIDE VASC ACCESS RIGHT  12/03/2019   MYOMECTOMY      Family History  Problem Relation Age of Onset   Diabetes Paternal Grandmother    Heart disease Neg Hx    Hypertension Neg Hx    Cancer Neg Hx        breast or colon cancer   Colon polyps Neg Hx    Esophageal cancer Neg Hx    Stomach cancer Neg Hx     Social History   Socioeconomic History   Marital status: Married    Spouse name: Not on file   Number of children: 1   Years of education: Not on file   Highest education level: Not on file  Occupational History   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  Tobacco Use   Smoking status: Never    Passive exposure: Past   Smokeless tobacco: Never  Vaping Use   Vaping Use: Never used  Substance and Sexual Activity   Alcohol use: Not Currently    Comment: rare   Drug use: No   Sexual activity: Yes    Partners: Male    Birth control/protection: None  Other Topics Concern   Not on file  Social History Narrative   Not on file   Social Determinants of Health   Financial Resource Strain: Not on file  Food Insecurity: Not on file  Transportation Needs: Not on file  Physical Activity: Not on file  Stress: Not on file  Social Connections: Not on file  Intimate Partner Violence: Not on file   Review of Systems  Constitutional:  Negative for fatigue and unexpected weight change.       Wears seat belt  HENT:  Negative for dental problem, hearing loss and tinnitus.        Keeps up with dentist  Eyes:  Negative for visual  disturbance.       No diplopia or unilateral vision loss  Respiratory:  Negative for cough, chest tightness and shortness of breath.   Cardiovascular:  Negative for chest pain and palpitations.  Gastrointestinal:  Negative for blood in stool and constipation.       No heartburn  Endocrine: Negative for polydipsia and polyuria.  Genitourinary:  Positive for frequency. Negative for dyspareunia, dysuria and hematuria.  Musculoskeletal:  Negative for arthralgias and joint swelling.       Some mild back pain---thinking of replacing mattrress  Skin:  Negative for rash.  Allergic/Immunologic: Negative for environmental allergies and immunocompromised state.  Neurological:  Negative for dizziness, syncope, light-headedness and headaches.  Hematological:  Negative for adenopathy. Does not bruise/bleed easily.  Psychiatric/Behavioral:  Negative for dysphoric mood. The patient is not nervous/anxious.        Rare night sweats---sleeps okay generally       Objective:   Physical Exam Constitutional:      Appearance: Normal appearance.  HENT:     Mouth/Throat:     Pharynx: No oropharyngeal exudate or posterior oropharyngeal erythema.  Eyes:     Conjunctiva/sclera: Conjunctivae normal.     Pupils: Pupils are equal, round, and reactive to light.  Cardiovascular:     Rate and Rhythm: Normal rate and regular rhythm.     Pulses: Normal pulses.     Heart sounds: No murmur heard.    No gallop.  Pulmonary:     Effort: Pulmonary effort is normal.     Breath sounds: Normal breath sounds. No wheezing or rales.  Abdominal:     Palpations: Abdomen is soft.     Tenderness: There is no abdominal tenderness.  Musculoskeletal:     Cervical back: Neck supple.     Right lower leg: No edema.     Left lower leg: No edema.  Lymphadenopathy:     Cervical: No cervical adenopathy.  Skin:    Findings: No lesion or rash.     Comments: Stasis derm lower medial left calf  Neurological:     General: No focal  deficit present.     Mental Status: She is alert and oriented to person, place, and time.  Psychiatric:        Mood and Affect: Mood normal.        Behavior: Behavior normal.            Assessment & Plan:

## 2022-02-10 NOTE — Assessment & Plan Note (Signed)
Controlled with the progesterone

## 2022-02-10 NOTE — Assessment & Plan Note (Signed)
Healthy Recent normal colonoscopy Pap and mammo this year were good Will need updated COVID vaccine in the fall Prefers no flu vaccine Working on lifestyle

## 2022-03-27 ENCOUNTER — Ambulatory Visit (INDEPENDENT_AMBULATORY_CARE_PROVIDER_SITE_OTHER): Payer: BC Managed Care – PPO | Admitting: Podiatry

## 2022-03-27 DIAGNOSIS — L600 Ingrowing nail: Secondary | ICD-10-CM | POA: Diagnosis not present

## 2022-03-27 MED ORDER — CEPHALEXIN 500 MG PO CAPS: 500.0000 mg | ORAL_CAPSULE | Freq: Three times a day (TID) | ORAL | 0 refills | Status: DC

## 2022-03-27 NOTE — Patient Instructions (Signed)

## 2022-03-27 NOTE — Progress Notes (Signed)
Subjective: 46 year old female presents to the office today for concerns of ingrown toenail left big toe.  She said that she had a pedicure and afterwards is when it got more ingrown and causing discomfort.  No drainage or pus.  No recent injuries.  Objective: AAO x3, NAD DP/PT pulses palpable bilaterally, CRT less than 3 seconds Incurvation present to left lateral hallux nail border with granulation tissue present some scabbing present.  Localized edema but no erythema.  No ascending cellulitis.  No fluctuance or crepitation.  No pain with calf compression, swelling, warmth, erythema  Assessment: Left lateral hallux ingrown toenail  Plan: -All treatment options discussed with the patient including all alternatives, risks, complications.  -At this time, the patient is in agreement to proceeding with partial nail removal with chemical matricectomy to the symptomatic portion of the nail. Risks and complications were discussed with the patient for which they understand and written consent was obtained. Under sterile conditions a total of 3 mL of a mixture of 2% lidocaine plain and 0.5% Marcaine plain was infiltrated in a hallux block fashion. Once anesthetized, the skin was prepped in sterile fashion. A tourniquet was then applied. Next the lateral aspect of hallux nail border was then sharply excised making sure to remove the entire offending nail border. Once the nails were ensured to be removed area was debrided and the underlying skin was intact. There is no purulence identified in the procedure. Next phenol was then applied under standard conditions and copiously irrigated. Silvadene was applied. A dry sterile dressing was applied. After application of the dressing the tourniquet was removed and there is found to be an immediate capillary refill time to the digit. The patient tolerated the procedure well any complications. Post procedure instructions were discussed the patient for which he verbally  understood. Discussed signs/symptoms of infection and directed to call the office immediately should any occur or go directly to the emergency room. In the meantime, encouraged to call the office with any questions, concerns, changes symptoms. -Keflex -Patient encouraged to call the office with any questions, concerns, change in symptoms.   Trula Slade DPM

## 2022-06-15 ENCOUNTER — Other Ambulatory Visit: Payer: Self-pay | Admitting: Internal Medicine

## 2022-06-27 ENCOUNTER — Ambulatory Visit (INDEPENDENT_AMBULATORY_CARE_PROVIDER_SITE_OTHER): Payer: BC Managed Care – PPO

## 2022-06-27 ENCOUNTER — Ambulatory Visit: Payer: BC Managed Care – PPO | Admitting: Podiatry

## 2022-06-27 DIAGNOSIS — M7752 Other enthesopathy of left foot: Secondary | ICD-10-CM

## 2022-06-27 DIAGNOSIS — L600 Ingrowing nail: Secondary | ICD-10-CM

## 2022-06-27 MED ORDER — CEPHALEXIN 500 MG PO CAPS: 500.0000 mg | ORAL_CAPSULE | Freq: Three times a day (TID) | ORAL | 0 refills | Status: DC

## 2022-06-27 NOTE — Patient Instructions (Signed)

## 2022-07-03 NOTE — Progress Notes (Signed)
Subjective: Chief Complaint  Patient presents with   Ingrown Toenail    Left hallux lateral border red, swollen, sore and has drainage. Onset 1 week ago. Patient reports having an ingrown removed back in July 2023 and he was better for a while until last week it started to feel sore and progressively got worse.    46 year old female presents the office today for concerns of recurrence of the left big toenail. She said the nail is again ingrown causing pain.  No drainage or pus at this time.  Area is tender with pressure.  Objective: AAO x3, NAD DP/PT pulses palpable bilaterally, CRT less than 3 seconds Regards to ingrown toenail left lateral nail border with localized edema and erythema but there is no drainage or pus noted today.  No ascending cellulitis.  Likely more from inflammation as opposed to infection. No pain with calf compression, swelling, warmth, erythema  Assessment: Ingrown toenail left hallux, bone spur left distal phalanx  Plan: -All treatment options discussed with the patient including all alternatives, risks, complications.  -X-rays obtained reviewed.  3 views of the foot were obtained.  No evidence of acute fracture or osteomyelitis.  Lateral view small dorsal spurring present at the distal phalanx. -At this time, the patient is requesting partial nail removal with chemical matricectomy to the symptomatic portion of the nail. Risks and complications were discussed with the patient for which they understand and written consent was obtained. Under sterile conditions a total of 3 mL of a mixture of 2% lidocaine plain and 0.5% Marcaine plain was infiltrated in a hallux block fashion. Once anesthetized, the skin was prepped in sterile fashion. A tourniquet was then applied. Next the latearl aspect of hallux nail border was then sharply excised making sure to remove the entire offending nail border. Once the nails were ensured to be removed area was debrided and the underlying  skin was intact. There is no purulence identified in the procedure. Next phenol was then applied under standard conditions and copiously irrigated. Silvadene was applied. A dry sterile dressing was applied. After application of the dressing the tourniquet was removed and there is found to be an immediate capillary refill time to the digit. The patient tolerated the procedure well any complications. Post procedure instructions were discussed the patient for which he verbally understood. Follow-up in one week for nail check or sooner if any problems are to arise. Discussed signs/symptoms of infection and directed to call the office immediately should any occur or go directly to the emergency room. In the meantime, encouraged to call the office with any questions, concerns, changes symptoms. -Nail sent to pathology at [facility]-specimen was given to Isidore Moos, [place].  -Discussed given the bone spur this could be causing the ingrown toenail as well.  She will need to have an exostectomy to help with this.  She will consider this in the future.  It is small enough that not sure if it is causing significant problems. -Patient encouraged to call the office with any questions, concerns, change in symptoms.   Trula Slade DPM

## 2022-07-13 ENCOUNTER — Telehealth: Payer: Self-pay | Admitting: *Deleted

## 2022-07-13 NOTE — Telephone Encounter (Signed)
error 

## 2022-07-27 ENCOUNTER — Encounter: Payer: Self-pay | Admitting: Podiatry

## 2022-09-12 ENCOUNTER — Other Ambulatory Visit: Payer: Self-pay | Admitting: Internal Medicine

## 2022-09-12 DIAGNOSIS — Z1231 Encounter for screening mammogram for malignant neoplasm of breast: Secondary | ICD-10-CM

## 2022-09-13 ENCOUNTER — Telehealth: Payer: Self-pay | Admitting: Internal Medicine

## 2022-09-13 NOTE — Telephone Encounter (Signed)
Prescription Request  09/13/2022  Is this a "Controlled Substance" medicine? No  LOV: 02/10/2022  What is the name of the medication or equipment? XARELTO 20 MG TABS tablet   Have you contacted your pharmacy to request a refill? Yes   Which pharmacy would you like this sent to?  [id number]- [place], [place] - [address] [id number]: [phone]: [phone]   Patient notified that their request is being sent to the clinical staff for review and that they should receive a response within 2 business days.   Please advise at Mobile [phone](mobile)    Patient also stated she is out and what can she do for the time being. Call back number [phone]

## 2022-09-13 NOTE — Telephone Encounter (Signed)
Rx sent 

## 2022-09-27 ENCOUNTER — Ambulatory Visit: Payer: BC Managed Care – PPO | Admitting: Internal Medicine

## 2022-09-27 ENCOUNTER — Encounter: Payer: Self-pay | Admitting: Internal Medicine

## 2022-09-27 VITALS — BP 118/80 | HR 116 | Temp 100.2°F | Ht 68.5 in | Wt 239.0 lb

## 2022-09-27 DIAGNOSIS — J101 Influenza due to other identified influenza virus with other respiratory manifestations: Secondary | ICD-10-CM | POA: Insufficient documentation

## 2022-09-27 DIAGNOSIS — R509 Fever, unspecified: Secondary | ICD-10-CM | POA: Diagnosis not present

## 2022-09-27 LAB — POC COVID19 BINAXNOW: SARS Coronavirus 2 Ag: NEGATIVE

## 2022-09-27 LAB — POCT INFLUENZA A/B
Influenza A, POC: NEGATIVE
Influenza B, POC: POSITIVE — AB

## 2022-09-27 MED ORDER — OSELTAMIVIR PHOSPHATE 75 MG PO CAPS: 75.0000 mg | ORAL_CAPSULE | Freq: Two times a day (BID) | ORAL | 0 refills | Status: DC

## 2022-09-27 NOTE — Progress Notes (Signed)
Subjective:    Patient ID: [name], female    DOB: 1976/06/02, 47 y.o.   MRN: [id number]  HPI Here due to a respiratory infection  Started with symptoms of sore throat---probably 2 days ago (or the night before) Pain with swallowing Felt hot and chills Then cough started Headache Loss of appetite--some stomach pain No sig myalgias No SOB  Current Outpatient Medications on File Prior to Visit  Medication Sig Dispense Refill   furosemide (LASIX) 40 MG tablet TAKE 1 TABLET BY MOUTH DAILY AS NEEDED 90 tablet 0   mupirocin ointment (BACTROBAN) 2 % Apply 1 application. topically 2 (two) times daily. 30 g 2   norethindrone (AYGESTIN) 5 MG tablet TAKE '15MG'$  ONCE PER DAY UNTIL BLEEDING STOPS. ONCE BLEEDING STOPS CAN GO BACK 10 MG. 180 tablet 12   silver sulfADIAZINE (SILVADENE) 1 % cream APPLY TOPICALLY TO AFFECTED AREA EVERY DAY 50 g 0   XARELTO 20 MG TABS tablet TAKE 1 TABLET BY MOUTH DAILY WITH SUPPER 90 tablet 3   No current facility-administered medications on file prior to visit.    No Known Allergies  Past Medical History:  Diagnosis Date   Anemia 09/2017   REQUIRING A TRANSFUSION   Antiphospholipid syndrome (HCC)    Breast cyst    BILATERAL   DVT (deep venous thrombosis) ([place])    Dysfunctional uterine bleeding    Fibroids    Obesity     Past Surgical History:  Procedure Laterality Date   CESAREAN SECTION     DILITATION & CURRETTAGE/HYSTROSCOPY WITH HYDROTHERMAL ABLATION N/A 10/08/2014   Procedure: DILATATION & CURETTAGE/HYSTEROSCOPY WITH HYDROTHERMAL ABLATION;  Surgeon: Osborne Oman, MD;  Location: [facility];  Service: Gynecology;  Laterality: N/A;   IR ANGIOGRAM PELVIS SELECTIVE OR SUPRASELECTIVE  12/03/2019   IR ANGIOGRAM PELVIS SELECTIVE OR SUPRASELECTIVE  12/03/2019   IR ANGIOGRAM SELECTIVE EACH ADDITIONAL VESSEL  12/03/2019   IR ANGIOGRAM SELECTIVE EACH ADDITIONAL VESSEL  12/03/2019   IR EMBO TUMOR ORGAN ISCHEMIA INFARCT INC GUIDE ROADMAPPING  12/03/2019    IR RADIOLOGIST EVAL & MGMT  10/10/2017   IR RADIOLOGIST EVAL & MGMT  11/28/2017   IR RADIOLOGIST EVAL & MGMT  12/12/2017   IR RADIOLOGIST EVAL & MGMT  08/07/2019   IR RADIOLOGIST EVAL & MGMT  10/15/2019   IR SINUS/FIST TUBE CHK-NON GI  12/26/2017   IR US GUIDE VASC ACCESS LEFT  12/03/2019   IR US GUIDE VASC ACCESS RIGHT  12/03/2019   MYOMECTOMY      Family History  Problem Relation Age of Onset   Diabetes Paternal Grandmother    Heart disease Neg Hx    Hypertension Neg Hx    Cancer Neg Hx        breast or colon cancer   Colon polyps Neg Hx    Esophageal cancer Neg Hx    Stomach cancer Neg Hx     Social History   Socioeconomic History   Marital status: Married    Spouse name: Not on file   Number of children: 1   Years of education: Not on file   Highest education level: Not on file  Occupational History   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  Tobacco Use   Smoking status: Never    Passive exposure: Past   Smokeless tobacco: Never  Vaping Use   Vaping Use: Never used  Substance and Sexual Activity   Alcohol use: Not Currently    Comment: rare  Drug use: No   Sexual activity: Yes    Partners: Male    Birth control/protection: None  Other Topics Concern   Not on file  Social History Narrative   Not on file   Social Determinants of Health   Financial Resource Strain: Not on file  Food Insecurity: Not on file  Transportation Needs: Not on file  Physical Activity: Not on file  Stress: Not on file  Social Connections: Not on file  Intimate Partner Violence: Not on file   Review of Systems Loose stool once Some nausea--no vomiting Able to drink--but no appetite     Objective:   Physical Exam Constitutional:      Comments: Appears tired but no distress  HENT:     Right Ear: Tympanic membrane and ear canal normal.     Left Ear: Tympanic membrane and ear canal normal.     Mouth/Throat:     Comments: Mild pharyngeal injection without  exudates or swelling Pulmonary:     Effort: Pulmonary effort is normal.     Breath sounds: Normal breath sounds. No wheezing or rales.  Musculoskeletal:     Cervical back: Neck supple.  Lymphadenopathy:     Cervical: No cervical adenopathy.  Neurological:     Mental Status: She is alert.            Assessment & Plan:

## 2022-09-27 NOTE — Assessment & Plan Note (Signed)
2 days of symptoms Discussed antivirals---will try tamiflu Continue regular tylenol OTC cough meds OOW till 2/5

## 2022-11-20 ENCOUNTER — Other Ambulatory Visit: Payer: Self-pay | Admitting: Obstetrics & Gynecology

## 2022-11-20 DIAGNOSIS — D259 Leiomyoma of uterus, unspecified: Secondary | ICD-10-CM

## 2022-11-27 ENCOUNTER — Ambulatory Visit
Admission: RE | Admit: 2022-11-27 | Discharge: 2022-11-27 | Disposition: A | Discharge status: Home / Self Care | Payer: BC Managed Care – PPO | Source: Ambulatory Visit | Attending: Internal Medicine | Admitting: Internal Medicine

## 2022-11-27 DIAGNOSIS — Z1231 Encounter for screening mammogram for malignant neoplasm of breast: Secondary | ICD-10-CM

## 2022-11-30 ENCOUNTER — Ambulatory Visit (INDEPENDENT_AMBULATORY_CARE_PROVIDER_SITE_OTHER): Payer: BC Managed Care – PPO | Admitting: Internal Medicine

## 2022-11-30 ENCOUNTER — Encounter: Payer: Self-pay | Admitting: Internal Medicine

## 2022-11-30 VITALS — BP 112/76 | HR 75 | Temp 97.5°F | Ht 68.5 in | Wt 241.0 lb

## 2022-11-30 DIAGNOSIS — J0100 Acute maxillary sinusitis, unspecified: Secondary | ICD-10-CM

## 2022-11-30 MED ORDER — AMOXICILLIN 500 MG PO TABS: 1000.0000 mg | ORAL_TABLET | Freq: Two times a day (BID) | ORAL | 0 refills | Status: AC

## 2022-11-30 NOTE — Progress Notes (Signed)
Subjective:    Patient ID: [name], female    DOB: Jun 22, 1976, 47 y.o.   MRN: [id number]  HPI Here due to respiratory symptoms  Having cough with sputum Now seeing blood Cough never went away after flu 2 months ago Waxed and waned---was able to get along Now with increased sputum No fever, chills Just mild sweats  No SOB No headache---but using fluticasone spray No ear pain  Current Outpatient Medications on File Prior to Visit  Medication Sig Dispense Refill   furosemide (LASIX) 40 MG tablet TAKE 1 TABLET BY MOUTH DAILY AS NEEDED 90 tablet 0   mupirocin ointment (BACTROBAN) 2 % Apply 1 application. topically 2 (two) times daily. 30 g 2   norethindrone (AYGESTIN) 5 MG tablet TAKE 15MG  ONCE PER DAY UNTIL BLEEDING STOPS. ONCE BLEEDING STOPS CAN GO BACK 10 MG. 180 tablet 12   silver sulfADIAZINE (SILVADENE) 1 % cream APPLY TOPICALLY TO AFFECTED AREA EVERY DAY 50 g 0   XARELTO 20 MG TABS tablet TAKE 1 TABLET BY MOUTH DAILY WITH SUPPER 90 tablet 3   No current facility-administered medications on file prior to visit.    No Known Allergies  Past Medical History:  Diagnosis Date   Anemia 09/2017   REQUIRING A TRANSFUSION   Antiphospholipid syndrome    Breast cyst    BILATERAL   DVT (deep venous thrombosis)    Dysfunctional uterine bleeding    Fibroids    Obesity     Past Surgical History:  Procedure Laterality Date   CESAREAN SECTION     DILITATION & CURRETTAGE/HYSTROSCOPY WITH HYDROTHERMAL ABLATION N/A 10/08/2014   Procedure: DILATATION & CURETTAGE/HYSTEROSCOPY WITH HYDROTHERMAL ABLATION;  Surgeon: Osborne Oman, MD;  Location: [facility];  Service: Gynecology;  Laterality: N/A;   IR ANGIOGRAM PELVIS SELECTIVE OR SUPRASELECTIVE  12/03/2019   IR ANGIOGRAM PELVIS SELECTIVE OR SUPRASELECTIVE  12/03/2019   IR ANGIOGRAM SELECTIVE EACH ADDITIONAL VESSEL  12/03/2019   IR ANGIOGRAM SELECTIVE EACH ADDITIONAL VESSEL  12/03/2019   IR EMBO TUMOR ORGAN ISCHEMIA INFARCT INC  GUIDE ROADMAPPING  12/03/2019   IR RADIOLOGIST EVAL & MGMT  10/10/2017   IR RADIOLOGIST EVAL & MGMT  11/28/2017   IR RADIOLOGIST EVAL & MGMT  12/12/2017   IR RADIOLOGIST EVAL & MGMT  08/07/2019   IR RADIOLOGIST EVAL & MGMT  10/15/2019   IR SINUS/FIST TUBE CHK-NON GI  12/26/2017   IR US GUIDE VASC ACCESS LEFT  12/03/2019   IR US GUIDE VASC ACCESS RIGHT  12/03/2019   MYOMECTOMY      Family History  Problem Relation Age of Onset   Diabetes Paternal Grandmother    Heart disease Neg Hx    Hypertension Neg Hx    Cancer Neg Hx        breast or colon cancer   Colon polyps Neg Hx    Esophageal cancer Neg Hx    Stomach cancer Neg Hx     Social History   Socioeconomic History   Marital status: Married    Spouse name: Not on file   Number of children: 1   Years of education: Not on file   Highest education level: Not on file  Occupational History   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  Tobacco Use   Smoking status: Never    Passive exposure: Past   Smokeless tobacco: Never  Vaping Use   Vaping Use: Never used  Substance and Sexual Activity   Alcohol use:  Not Currently    Comment: rare   Drug use: No   Sexual activity: Yes    Partners: Male    Birth control/protection: None  Other Topics Concern   Not on file  Social History Narrative   Not on file   Social Determinants of Health   Financial Resource Strain: Not on file  Food Insecurity: Not on file  Transportation Needs: Not on file  Physical Activity: Not on file  Stress: Not on file  Social Connections: Not on file  Intimate Partner Violence: Not on file   Review of Systems Some throat itching No N/V Eating okay    Objective:   Physical Exam Constitutional:      Appearance: Normal appearance.  HENT:     Head:     Comments: No sinus tenderness    Right Ear: Tympanic membrane and ear canal normal.     Left Ear: Tympanic membrane and ear canal normal.     Nose:     Comments: Pale nasal  swelling    Mouth/Throat:     Pharynx: No oropharyngeal exudate or posterior oropharyngeal erythema.  Neck:     Comments: Small non tender anterior cervical nodes Pulmonary:     Effort: Pulmonary effort is normal.     Breath sounds: Normal breath sounds. No wheezing or rales.  Musculoskeletal:     Cervical back: Neck supple.  Neurological:     Mental Status: She is alert.            Assessment & Plan:

## 2022-11-30 NOTE — Assessment & Plan Note (Signed)
Has had mild symptoms since flu 2 months ago Now worse Seems to have allergy component Continue flonase and add loratadine 10-20mg  daily Will give amoxil 1000 bid x 7 days as well

## 2022-12-13 ENCOUNTER — Telehealth: Payer: Self-pay | Admitting: *Deleted

## 2022-12-13 NOTE — Telephone Encounter (Signed)
Patient called stating she was experiencing pain at a level of #7 on the lateral portion of her left lower extremity.  Patient describes the pain as a "burning" sensation with some "sharpness" noted. Patient continues wearing 15-20 stockings. Patient declined an appointment at this time as she wants to check her calendar for her availability on a Wednesday with Dr Edilia Bo.

## 2023-02-14 ENCOUNTER — Ambulatory Visit (INDEPENDENT_AMBULATORY_CARE_PROVIDER_SITE_OTHER): Payer: BC Managed Care – PPO | Admitting: Internal Medicine

## 2023-02-14 ENCOUNTER — Encounter: Payer: Self-pay | Admitting: Internal Medicine

## 2023-02-14 VITALS — BP 124/88 | HR 95 | Temp 98.1°F | Ht 68.5 in | Wt 248.0 lb

## 2023-02-14 DIAGNOSIS — I872 Venous insufficiency (chronic) (peripheral): Secondary | ICD-10-CM

## 2023-02-14 DIAGNOSIS — L97921 Non-pressure chronic ulcer of unspecified part of left lower leg limited to breakdown of skin: Secondary | ICD-10-CM | POA: Diagnosis not present

## 2023-02-14 DIAGNOSIS — E669 Obesity, unspecified: Secondary | ICD-10-CM

## 2023-02-14 DIAGNOSIS — I82409 Acute embolism and thrombosis of unspecified deep veins of unspecified lower extremity: Secondary | ICD-10-CM

## 2023-02-14 DIAGNOSIS — Z0000 Encounter for general adult medical examination without abnormal findings: Secondary | ICD-10-CM

## 2023-02-14 DIAGNOSIS — L97929 Non-pressure chronic ulcer of unspecified part of left lower leg with unspecified severity: Secondary | ICD-10-CM | POA: Insufficient documentation

## 2023-02-14 LAB — TSH: TSH: 0.97 u[IU]/mL (ref 0.35–5.50)

## 2023-02-14 LAB — COMPREHENSIVE METABOLIC PANEL
ALT: 22 U/L (ref 0–35)
AST: 14 U/L (ref 0–37)
Albumin: 4 g/dL (ref 3.5–5.2)
Alkaline Phosphatase: 58 U/L (ref 39–117)
BUN: 8 mg/dL (ref 6–23)
CO2: 26 mEq/L (ref 19–32)
Calcium: 9.3 mg/dL (ref 8.4–10.5)
Chloride: 105 mEq/L (ref 96–112)
Creatinine, Ser: 0.81 mg/dL (ref 0.40–1.20)
GFR: 85.99 mL/min (ref >60.00)
Glucose, Bld: 167 mg/dL — ABNORMAL HIGH (ref 70–99)
Potassium: 3.6 mEq/L (ref 3.5–5.1)
Sodium: 140 mEq/L (ref 135–145)
Total Bilirubin: 0.4 mg/dL (ref 0.2–1.2)
Total Protein: 7.2 g/dL (ref 6.0–8.3)

## 2023-02-14 LAB — CBC
HCT: 48.5 % — ABNORMAL HIGH (ref 36.0–46.0)
Hemoglobin: 15.9 g/dL — ABNORMAL HIGH (ref 12.0–15.0)
MCHC: 32.7 g/dL (ref 30.0–36.0)
MCV: 85 fl (ref 78.0–100.0)
Platelets: 253 10*3/uL (ref 150.0–400.0)
RBC: 5.7 Mil/uL — ABNORMAL HIGH (ref 3.87–5.11)
RDW: 14 % (ref 11.5–15.5)
WBC: 4.1 10*3/uL (ref 4.0–10.5)

## 2023-02-14 MED ORDER — SILVER SULFADIAZINE 1 % EX CREA: TOPICAL_CREAM | CUTANEOUS | 0 refills | Status: DC

## 2023-02-14 NOTE — Patient Instructions (Signed)

## 2023-02-14 NOTE — Assessment & Plan Note (Signed)
Abnormal skin area from venous disease Superficial and recurrent Will refill the silvadene

## 2023-02-14 NOTE — Assessment & Plan Note (Signed)
Healthy Needs to work on fitness Colon due 2023 Yearly mammogram--just had one Pap at gyn--is UTD Prefers no flu or COVID vaccines--I did recommend she reconsider Shingrix at 3

## 2023-02-14 NOTE — Progress Notes (Signed)
Subjective:    Patient ID: [name], female    DOB: 18-Sep-1975, 47 y.o.   MRN: [id number]  HPI Here for physical  Has noticed her weight gain here Does check at the gym Somewhat inconsistent lately Eat out a lot Occasional sweetened tea/lemonade  Left calf ulcer opened again Needs the silvadene again  No uterine bleeding---occasional spotting Keeps up with gyn  Current Outpatient Medications on File Prior to Visit  Medication Sig Dispense Refill   furosemide (LASIX) 40 MG tablet TAKE 1 TABLET BY MOUTH DAILY AS NEEDED 90 tablet 0   mupirocin ointment (BACTROBAN) 2 % Apply 1 application. topically 2 (two) times daily. 30 g 2   norethindrone (AYGESTIN) 5 MG tablet TAKE 15MG  ONCE PER DAY UNTIL BLEEDING STOPS. ONCE BLEEDING STOPS CAN GO BACK 10 MG. 180 tablet 12   silver sulfADIAZINE (SILVADENE) 1 % cream APPLY TOPICALLY TO AFFECTED AREA EVERY DAY 50 g 0   XARELTO 20 MG TABS tablet TAKE 1 TABLET BY MOUTH DAILY WITH SUPPER 90 tablet 3   No current facility-administered medications on file prior to visit.    No Known Allergies  Past Medical History:  Diagnosis Date   Anemia 09/2017   REQUIRING A TRANSFUSION   Antiphospholipid syndrome (HCC)    Breast cyst    BILATERAL   DVT (deep venous thrombosis) (HCC)    Dysfunctional uterine bleeding    Fibroids    Obesity     Past Surgical History:  Procedure Laterality Date   CESAREAN SECTION     DILITATION & CURRETTAGE/HYSTROSCOPY WITH HYDROTHERMAL ABLATION N/A 10/08/2014   Procedure: DILATATION & CURETTAGE/HYSTEROSCOPY WITH HYDROTHERMAL ABLATION;  Surgeon: Tereso Newcomer, MD;  Location: [facility];  Service: Gynecology;  Laterality: N/A;   IR ANGIOGRAM PELVIS SELECTIVE OR SUPRASELECTIVE  12/03/2019   IR ANGIOGRAM PELVIS SELECTIVE OR SUPRASELECTIVE  12/03/2019   IR ANGIOGRAM SELECTIVE EACH ADDITIONAL VESSEL  12/03/2019   IR ANGIOGRAM SELECTIVE EACH ADDITIONAL VESSEL  12/03/2019   IR EMBO TUMOR ORGAN ISCHEMIA INFARCT INC  GUIDE ROADMAPPING  12/03/2019   IR RADIOLOGIST EVAL & MGMT  10/10/2017   IR RADIOLOGIST EVAL & MGMT  11/28/2017   IR RADIOLOGIST EVAL & MGMT  12/12/2017   IR RADIOLOGIST EVAL & MGMT  08/07/2019   IR RADIOLOGIST EVAL & MGMT  10/15/2019   IR SINUS/FIST TUBE CHK-NON GI  12/26/2017   IR US GUIDE VASC ACCESS LEFT  12/03/2019   IR US GUIDE VASC ACCESS RIGHT  12/03/2019   MYOMECTOMY      Family History  Problem Relation Age of Onset   Diabetes Paternal Grandmother    Heart disease Neg Hx    Hypertension Neg Hx    Cancer Neg Hx        breast or colon cancer   Colon polyps Neg Hx    Esophageal cancer Neg Hx    Stomach cancer Neg Hx     Social History   Socioeconomic History   Marital status: Married    Spouse name: Not on file   Number of children: 1   Years of education: Not on file   Highest education level: Not on file  Occupational History   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  Tobacco Use   Smoking status: Never    Passive exposure: Past   Smokeless tobacco: Never  Vaping Use   Vaping Use: Never used  Substance and Sexual Activity   Alcohol use: Not Currently  Comment: rare   Drug use: No   Sexual activity: Yes    Partners: Male    Birth control/protection: None  Other Topics Concern   Not on file  Social History Narrative   Not on file   Social Determinants of Health   Financial Resource Strain: Not on file  Food Insecurity: Not on file  Transportation Needs: Not on file  Physical Activity: Not on file  Stress: Not on file  Social Connections: Not on file  Intimate Partner Violence: Not on file   Review of Systems  Constitutional:        Weight up some Gets tired easily Wears seat belt  HENT:  Negative for dental problem, hearing loss and tinnitus.        Keeps up with dentist  Eyes:  Negative for visual disturbance.       No diplopia or unilateral vision loss  Respiratory:  Negative for cough, chest tightness and shortness of  breath.   Cardiovascular:  Negative for chest pain and palpitations.       Slight leg swelling--on left (where past DVT)  Gastrointestinal:  Negative for blood in stool and constipation.       No heartburn  Endocrine: Negative for polydipsia and polyuria.  Genitourinary:  Negative for dyspareunia, dysuria and hematuria.  Musculoskeletal:  Negative for arthralgias, back pain and joint swelling.  Skin:  Negative for rash.  Allergic/Immunologic: Positive for environmental allergies. Negative for immunocompromised state.       Cetirizine usually helps  Neurological:  Negative for dizziness, syncope, light-headedness and headaches.  Hematological:  Negative for adenopathy. Does not bruise/bleed easily.  Psychiatric/Behavioral:  Negative for dysphoric mood and sleep disturbance. The patient is not nervous/anxious.        Objective:   Physical Exam Constitutional:      Appearance: Normal appearance.  HENT:     Mouth/Throat:     Pharynx: No oropharyngeal exudate or posterior oropharyngeal erythema.  Eyes:     Conjunctiva/sclera: Conjunctivae normal.     Pupils: Pupils are equal, round, and reactive to light.  Cardiovascular:     Rate and Rhythm: Normal rate and regular rhythm.     Pulses: Normal pulses.     Heart sounds: No murmur heard.    No gallop.  Pulmonary:     Effort: Pulmonary effort is normal.     Breath sounds: Normal breath sounds. No wheezing or rales.  Abdominal:     Palpations: Abdomen is soft.     Tenderness: There is no abdominal tenderness.  Musculoskeletal:     Cervical back: Neck supple.     Right lower leg: No edema.     Left lower leg: No edema.  Lymphadenopathy:     Cervical: No cervical adenopathy.  Skin:    Findings: No rash.     Comments: Superficial ulcer without inflammation above left lateral malleolus ( in area of abnormal dark hypertrophic skin)  Neurological:     General: No focal deficit present.     Mental Status: She is alert and oriented to  person, place, and time.  Psychiatric:        Mood and Affect: Mood normal.        Behavior: Behavior normal.            Assessment & Plan:

## 2023-02-14 NOTE — Assessment & Plan Note (Signed)
No recurrence on the xarelto 20mg  daily

## 2023-02-14 NOTE — Assessment & Plan Note (Signed)
Discussed support hose when having prolonged sitting

## 2023-02-14 NOTE — Assessment & Plan Note (Signed)
DASH info Avoid sugared drinks Get back regular with the gym

## 2023-02-22 ENCOUNTER — Ambulatory Visit (INDEPENDENT_AMBULATORY_CARE_PROVIDER_SITE_OTHER): Payer: BC Managed Care – PPO | Admitting: Podiatry

## 2023-02-22 DIAGNOSIS — L603 Nail dystrophy: Secondary | ICD-10-CM

## 2023-02-22 NOTE — Patient Instructions (Signed)
I would start a BIOTIN supplement or a vitamin for hair, skin and nails. You can also use tea tree oil to help hydrate the nail

## 2023-02-22 NOTE — Progress Notes (Signed)
Subjective: Chief Complaint  Patient presents with   Nail Problem    Left hallux nail is cracked at the tip of the nail x 1 month. No known injuries. Patient is not diabetic.    47 year old female presents the office with above concerns.  She does not recall any injury to the toe and she has no pain associated but she has noticed a "dent" or a split to the tip of the toenail.  The nail has not been growing out this past Friday others.  No swelling redness or drainage.  Objective: AAO x3, NAD DP/PT pulses palpable bilaterally, CRT less than 3 seconds At the left hallux toenail at the distal aspect there is a deep transverse ridge within the nail.  There is no incurvation of the nail at this time there is no edema, erythema or signs of infection.  No open lesions. No pain with calf compression, swelling, warmth, erythema  Assessment: Onychodystrophy  Plan: -All treatment options discussed with the patient including all alternatives, risks, complications.  -Patient debrided The nail with any complications or bleeding.  We discussed starting a Biotin supplement to help.  Discussed topical medications like to be beneficial as well.  We could consider urea nail gel but her nails not overly thickening.  Monitor any signs or symptoms of infection. -Patient encouraged to call the office with any questions, concerns, change in symptoms.   Vivi Barrack DPM

## 2023-08-12 ENCOUNTER — Other Ambulatory Visit: Payer: Self-pay | Admitting: Internal Medicine

## 2023-08-27 ENCOUNTER — Ambulatory Visit: Payer: Self-pay | Admitting: Internal Medicine

## 2023-08-27 ENCOUNTER — Encounter: Payer: Self-pay | Admitting: Hematology and Oncology

## 2023-08-27 NOTE — Telephone Encounter (Signed)
Noted--glad she could get in

## 2023-08-27 NOTE — Telephone Encounter (Signed)
  Chief Complaint: Left hand pain Symptoms: Pain Frequency: About a month Pertinent Negatives: Patient denies relief Disposition: [] ED /[] Urgent Care (no appt availability in office) / [x] Appointment(In office/virtual)/ []  [facility]/ [] Home Care/ [] Refused Recommended Disposition /[] [facility]/ []  Follow-up with PCP Additional Notes: Patient called in complaining of left hand/thumb pain that started about a month ago. Patient denies relief from pain after taking Tylenol and soaking in salt baths. Patient stated that she can still use the hand normally. No cuts, bruises, or swelling are present. Patient denies other symptoms. Scheduled office visit for tomorrow, 12/31.   Reason for Disposition  [1] After 2 weeks AND [2] still painful  Answer Assessment - Initial Assessment Questions 1. MECHANISM: "How did the injury happen?"     Unknown  2. ONSET: "When did the injury happen?" (Minutes or hours ago)      About month  3. APPEARANCE of INJURY: "What does the injury look like?"      Appears normal  4. SEVERITY: "Can you use the hand normally?" "Can you bend your fingers into a ball and then fully open them?"     Can use it normally  5. SIZE: For cuts, bruises, or swelling, ask: "How large is it?" (e.g., inches or centimeters;  entire hand or wrist)      Denies cuts, bruises, and swelling  6. PAIN: "Is there pain?" If Yes, ask: "How bad is the pain?"  (Scale 1-10; or mild, moderate, severe)     5 or 6, pain is constant- taking Tylenol and soaking in salt bath to help with pain  8. OTHER SYMPTOMS: "Do you have any other symptoms?"      None  Protocols used: Hand and Wrist Injury-A-AH

## 2023-08-28 ENCOUNTER — Ambulatory Visit: Payer: BC Managed Care – PPO | Admitting: Nurse Practitioner

## 2023-08-28 ENCOUNTER — Ambulatory Visit (INDEPENDENT_AMBULATORY_CARE_PROVIDER_SITE_OTHER)
Admission: RE | Admit: 2023-08-28 | Discharge: 2023-08-28 | Disposition: A | Discharge status: Home / Self Care | Payer: BC Managed Care – PPO | Source: Ambulatory Visit | Attending: Nurse Practitioner

## 2023-08-28 VITALS — BP 132/88 | HR 98 | Temp 98.6°F | Ht 68.5 in | Wt 251.4 lb

## 2023-08-28 DIAGNOSIS — M79645 Pain in left finger(s): Secondary | ICD-10-CM | POA: Diagnosis not present

## 2023-08-28 NOTE — Progress Notes (Signed)
   Acute Office Visit  Subjective:     Patient ID: [name], female    DOB: 11/19/1975, 47 y.o.   MRN: [id number]  Chief Complaint  Patient presents with   Hand Pain    Pt complain of pain in thumb on left hand. States she she presses on the base of thumb it hurts. Started a month ago. Feels weird pain level 5-6 today.      Patient is in today for thumb pain with a history of DVT, DUB, IDA, ulcer of leg  Left thumb pain that stated approx 1 month ago. States no knownn injry that she is aware of. She may had held a heavy book bag. States that it hurts with movement. States that it is hard to extended. States that she is rihgt hand dominant. She does use a computer a lot. States that she has used epsom slat and tylenol . Some numbness/tingling that is intermittment.described as an ache.  States that she does have some stiffiness when she wakes up. States that stiffness last approx an hour or under. States that sht stiffness can be constant  Review of Systems  Constitutional:  Negative for chills and fever.  Respiratory:  Negative for shortness of breath.   Cardiovascular:  Negative for chest pain.  Musculoskeletal:  Positive for joint pain.  Neurological:  Positive for weakness. Negative for tingling and headaches.        Objective:    BP 132/88   Pulse 98   Temp 98.6 F (37 C) (Oral)   Ht 5' 8.5 (1.74 m)   Wt 251 lb 6.4 oz (114 kg)   SpO2 97%   BMI 37.67 kg/m    Physical Exam Vitals and nursing note reviewed.  Constitutional:      Appearance: Normal appearance.  Cardiovascular:     Rate and Rhythm: Normal rate and regular rhythm.     Heart sounds: Normal heart sounds.  Pulmonary:     Effort: Pulmonary effort is normal.     Breath sounds: Normal breath sounds.  Musculoskeletal:        General: Tenderness present.       Arms:     Comments: Negative Finkelstein   Bilateral radial pulse 2+ Bicep tendon 2+  Neurological:     Mental Status: She is  alert.     No results found for any visits on 08/28/23.      Assessment & Plan:   Problem List Items Addressed This Visit       Other   Thumb pain, left - Primary   Patient continue taking Tylenol  over-the-counter as directed.  And on Voltaren  gel 4 times daily as needed.  Pending x-rays consistently normal for a month if x-ray is normal patient can consider using a thumb spica brace through the day with taking it off at bedtime pending x-ray results      Relevant Orders   DG Finger Thumb Left    No orders of the defined types were placed in this encounter.   Return if symptoms worsen or fail to improve.  Adina Crandall, NP

## 2023-08-28 NOTE — Patient Instructions (Signed)
Nice to see you toyda I will be in touch with the xray once I have reviewed it Tylenol as directed on the bottle Voltaren Gel over the counter is ok too

## 2023-08-28 NOTE — Assessment & Plan Note (Signed)
 Patient continue taking Tylenol  over-the-counter as directed.  And on Voltaren  gel 4 times daily as needed.  Pending x-rays consistently normal for a month if x-ray is normal patient can consider using a thumb spica brace through the day with taking it off at bedtime pending x-ray results

## 2023-09-04 ENCOUNTER — Other Ambulatory Visit: Payer: Self-pay | Admitting: Internal Medicine

## 2023-09-18 ENCOUNTER — Other Ambulatory Visit: Payer: Self-pay | Admitting: Obstetrics & Gynecology

## 2023-09-18 DIAGNOSIS — Z1231 Encounter for screening mammogram for malignant neoplasm of breast: Secondary | ICD-10-CM

## 2023-10-18 ENCOUNTER — Ambulatory Visit: Payer: Self-pay | Admitting: Internal Medicine

## 2023-10-18 NOTE — Telephone Encounter (Signed)
Spoke to pt. Made OV tomorrow.

## 2023-10-18 NOTE — Telephone Encounter (Signed)
Chief Complaint: Productive cough, sore throat Symptoms: productive cough, sore throat, congestion Frequency: a week Pertinent Negatives: Patient denies fever Disposition: [] ED /[] Urgent Care (no appt availability in office) / [] Appointment(In office/virtual)/ []  [facility]/ [x] Home Care/ [] Refused Recommended Disposition /[]  Mobile Bus/ []  Follow-up with PCP Additional Notes: Patient called in stating she has been feeling ill for a week with a productive cough (yellow phlegm), scratchy throat, and congestion. Patient denies fever. Advised patient on home care advice and to call us back if symptoms worsen or fever develops.    Copied from CRM [id number]. Topic: Clinical - Red Word Triage >> Oct 18, 2023  2:10 PM Adele Barthel wrote: [facility] that prompted transfer to Nurse Triage:   Symptoms started a week ago Sore throat, itchy No chills and fever Nasal congestion.  Wet cough, severe Has tried Tylenol and Nyquil Reason for Disposition  Cough with cold symptoms (e.g., runny nose, postnasal drip, throat clearing)  Answer Assessment - Initial Assessment Questions 1. ONSET: "When did the cough begin?"      A week ago 2. SEVERITY: "How bad is the cough today?"      Still severe today 3. SPUTUM: "Describe the color of your sputum" (none, dry cough; clear, white, yellow, green)     Yellow  4. HEMOPTYSIS: "Are you coughing up any blood?" If so ask: "How much?" (flecks, streaks, tablespoons, etc.)     No 5. DIFFICULTY BREATHING: "Are you having difficulty breathing?" If Yes, ask: "How bad is it?" (e.g., mild, moderate, severe)    - MILD: No SOB at rest, mild SOB with walking, speaks normally in sentences, can lie down, no retractions, pulse < 100.    - MODERATE: SOB at rest, SOB with minimal exertion and prefers to sit, cannot lie down flat, speaks in phrases, mild retractions, audible wheezing, pulse 100-120.    - SEVERE: Very SOB at rest, speaks in single words, struggling  to breathe, sitting hunched forward, retractions, pulse > 120      No 6. FEVER: "Do you have a fever?" If Yes, ask: "What is your temperature, how was it measured, and when did it start?"     No 7. CARDIAC HISTORY: "Do you have any history of heart disease?" (e.g., heart attack, congestive heart failure)      No 8. LUNG HISTORY: "Do you have any history of lung disease?"  (e.g., pulmonary embolus, asthma, emphysema)     No 9. PE RISK FACTORS: "Do you have a history of blood clots?" (or: recent major surgery, recent prolonged travel, bedridden)     Yes - DVT 10. OTHER SYMPTOMS: "Do you have any other symptoms?" (e.g., runny nose, wheezing, chest pain)       Sore, itchy throat, congestion  Protocols used: Cough - Acute Productive-A-AH

## 2023-10-19 ENCOUNTER — Encounter: Payer: Self-pay | Admitting: Hematology and Oncology

## 2023-10-19 ENCOUNTER — Ambulatory Visit: Payer: Self-pay | Admitting: Internal Medicine

## 2023-10-19 ENCOUNTER — Encounter: Payer: Self-pay | Admitting: Internal Medicine

## 2023-10-19 VITALS — BP 104/74 | HR 113 | Temp 99.0°F | Ht 68.5 in | Wt 254.0 lb

## 2023-10-19 DIAGNOSIS — R7301 Impaired fasting glucose: Secondary | ICD-10-CM | POA: Insufficient documentation

## 2023-10-19 DIAGNOSIS — J019 Acute sinusitis, unspecified: Secondary | ICD-10-CM | POA: Diagnosis not present

## 2023-10-19 LAB — GLUCOSE, RANDOM: Glucose, Bld: 132 mg/dL — ABNORMAL HIGH (ref 70–99)

## 2023-10-19 LAB — HEMOGLOBIN A1C: Hgb A1c MFr Bld: 6.7 % — ABNORMAL HIGH (ref 4.6–6.5)

## 2023-10-19 MED ORDER — AMOXICILLIN-POT CLAVULANATE 875-125 MG PO TABS: 1.0000 | ORAL_TABLET | Freq: Two times a day (BID) | ORAL | 0 refills | Status: DC

## 2023-10-19 NOTE — Assessment & Plan Note (Signed)
Questions last glucose--thinks she was fasting Will recheck fasting labs soon

## 2023-10-19 NOTE — Assessment & Plan Note (Signed)
Sick for 8 days Likely COVID or other viral infection at first---and now settled in sinuses Discussed analgesics Augmentin 875 bid x 7 days

## 2023-10-19 NOTE — Progress Notes (Signed)
Subjective:    Patient ID: [name], female    DOB: Jan 04, 1976, 48 y.o.   MRN: [id number]  HPI Here due to respiratory illness  Noted scratchy throat a week ago Worsened--then affected her voice Lots of phlegm and with cough Head congestion and post nasal drip No fever, chills or sweats No SOB No headache or ear pain Still with persistent cough and drainage  Tried tylenol cold, vicks, nyquil---?slight help  Current Outpatient Medications on File Prior to Visit  Medication Sig Dispense Refill   furosemide (LASIX) 40 MG tablet TAKE 1 TABLET BY MOUTH DAILY AS NEEDED 90 tablet 0   mupirocin ointment (BACTROBAN) 2 % Apply 1 application. topically 2 (two) times daily. 30 g 2   norethindrone (AYGESTIN) 5 MG tablet TAKE 15MG  ONCE PER DAY UNTIL BLEEDING STOPS. ONCE BLEEDING STOPS CAN GO BACK 10 MG. 180 tablet 12   silver sulfADIAZINE (SILVADENE) 1 % cream APPLY TOPICALLY TO AFFECTED AREA EVERY DAY 50 g 0   XARELTO 20 MG TABS tablet TAKE 1 TABLET BY MOUTH DAILY WITH SUPPER 90 tablet 3   No current facility-administered medications on file prior to visit.    No Known Allergies  Past Medical History:  Diagnosis Date   Anemia 09/2017   REQUIRING A TRANSFUSION   Antiphospholipid syndrome (HCC)    Breast cyst    BILATERAL   DVT (deep venous thrombosis) (HCC)    Dysfunctional uterine bleeding    Fibroids    Obesity     Past Surgical History:  Procedure Laterality Date   CESAREAN SECTION     DILITATION & CURRETTAGE/HYSTROSCOPY WITH HYDROTHERMAL ABLATION N/A 10/08/2014   Procedure: DILATATION & CURETTAGE/HYSTEROSCOPY WITH HYDROTHERMAL ABLATION;  Surgeon: Tereso Newcomer, MD;  Location: [facility];  Service: Gynecology;  Laterality: N/A;   IR ANGIOGRAM PELVIS SELECTIVE OR SUPRASELECTIVE  12/03/2019   IR ANGIOGRAM PELVIS SELECTIVE OR SUPRASELECTIVE  12/03/2019   IR ANGIOGRAM SELECTIVE EACH ADDITIONAL VESSEL  12/03/2019   IR ANGIOGRAM SELECTIVE EACH ADDITIONAL VESSEL  12/03/2019    IR EMBO TUMOR ORGAN ISCHEMIA INFARCT INC GUIDE ROADMAPPING  12/03/2019   IR RADIOLOGIST EVAL & MGMT  10/10/2017   IR RADIOLOGIST EVAL & MGMT  11/28/2017   IR RADIOLOGIST EVAL & MGMT  12/12/2017   IR RADIOLOGIST EVAL & MGMT  08/07/2019   IR RADIOLOGIST EVAL & MGMT  10/15/2019   IR SINUS/FIST TUBE CHK-NON GI  12/26/2017   IR US GUIDE VASC ACCESS LEFT  12/03/2019   IR US GUIDE VASC ACCESS RIGHT  12/03/2019   MYOMECTOMY      Family History  Problem Relation Age of Onset   Diabetes Paternal Grandmother    Heart disease Neg Hx    Hypertension Neg Hx    Cancer Neg Hx        breast or colon cancer   Colon polyps Neg Hx    Esophageal cancer Neg Hx    Stomach cancer Neg Hx     Social History   Socioeconomic History   Marital status: Married    Spouse name: Not on file   Number of children: 1   Years of education: Not on file   Highest education level: Not on file  Occupational History   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  Tobacco Use   Smoking status: Never    Passive exposure: Past   Smokeless tobacco: Never  Vaping Use   Vaping status: Never Used  Substance and Sexual  Activity   Alcohol use: Not Currently    Comment: rare   Drug use: No   Sexual activity: Yes    Partners: Male    Birth control/protection: None  Other Topics Concern   Not on file  Social History Narrative   Not on file   Social Drivers of Health   Financial Resource Strain: Not on file  Food Insecurity: Not on file  Transportation Needs: Not on file  Physical Activity: Not on file  Stress: Not on file  Social Connections: Not on file  Intimate Partner Violence: Not on file   Review of Systems No loss of smell or taste Eating okay No N/V     Objective:   Physical Exam Constitutional:      Appearance: Normal appearance.  HENT:     Head:     Comments: No sinus tenderness    Mouth/Throat:     Pharynx: No oropharyngeal exudate or posterior oropharyngeal erythema.   Pulmonary:     Effort: Pulmonary effort is normal.     Breath sounds: Normal breath sounds. No wheezing or rales.  Musculoskeletal:     Cervical back: Neck supple.  Lymphadenopathy:     Cervical: No cervical adenopathy.  Neurological:     Mental Status: She is alert.            Assessment & Plan:

## 2023-10-20 ENCOUNTER — Encounter: Payer: Self-pay | Admitting: Internal Medicine

## 2023-10-22 ENCOUNTER — Other Ambulatory Visit: Payer: Self-pay | Admitting: Internal Medicine

## 2023-10-22 DIAGNOSIS — E119 Type 2 diabetes mellitus without complications: Secondary | ICD-10-CM

## 2023-10-23 ENCOUNTER — Telehealth: Payer: Self-pay

## 2023-10-23 MED ORDER — LANCETS MISC. MISC: 1.0000 | Freq: Three times a day (TID) | 3 refills | Status: AC

## 2023-10-23 MED ORDER — BLOOD GLUCOSE TEST VI STRP: 1.0000 | ORAL_STRIP | Freq: Three times a day (TID) | 3 refills | Status: DC

## 2023-10-23 MED ORDER — LANCET DEVICE MISC: 1.0000 | Freq: Three times a day (TID) | 0 refills | Status: AC

## 2023-10-23 MED ORDER — BLOOD GLUCOSE MONITORING SUPPL DEVI: 1.0000 | Freq: Three times a day (TID) | 0 refills | Status: AC

## 2023-10-23 NOTE — Telephone Encounter (Signed)
 Rx sent electronically.

## 2023-11-02 ENCOUNTER — Ambulatory Visit: Payer: 59 | Admitting: Internal Medicine

## 2023-11-09 ENCOUNTER — Encounter: Payer: 59 | Attending: Internal Medicine | Admitting: Dietician

## 2023-11-09 DIAGNOSIS — E119 Type 2 diabetes mellitus without complications: Secondary | ICD-10-CM | POA: Diagnosis present

## 2023-11-09 NOTE — Patient Instructions (Signed)
 Aim for balanced meals and snacks on a schedule

## 2023-11-09 NOTE — Progress Notes (Signed)
 Diabetes Self-Management Education  Visit Type: First/Initial  Appt. Start Time: 1035 Appt. End Time: 1135   11/09/2023  [name], identified by name and date of birth, is a 48 y.o. female with a diagnosis of Diabetes: Type 2.   ASSESSMENT  Patient is here today alone. Patient would like to learn more about dieary management. 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  Patient lives with husband and 38 yo daughter.  Pt repoorts she does the shopping and cooking.  Pt desires to control blood sugar with lifestyle and dietary factors. Pt reports she eats out ~3-4 times weekly. Pt reports self monitoring blood sugar twice most day with recent values before meal ranging "103-135" mg/dL and 2 hour post prandial ranging "97- 100" mg/dL. All Pt's questions were answered during this encounter   History includes:   Past Medical History:  Diagnosis Date   Anemia 09/2017   REQUIRING A TRANSFUSION   Antiphospholipid syndrome (HCC)    Breast cyst    BILATERAL   DVT (deep venous thrombosis) (HCC)    Dysfunctional uterine bleeding    Fibroids    Obesity     Labs noted:   Lab Results  Component Value Date   HGBA1C 6.7 (H) 10/19/2023   Lab Results  Component Value Date   CHOL 140 03/08/2017   HDL 24.20 (L) 03/08/2017   LDLCALC 91 03/08/2017   TRIG 121.0 03/08/2017   CHOLHDL 6 03/08/2017   Medications include:   Current Outpatient Medications:    Blood Glucose Monitoring Suppl DEVI, 1 each by Does not apply route in the morning, at noon, and at bedtime. May substitute to any manufacturer covered by patient's insurance., Disp: 1 each, Rfl: 0   Glucose Blood (BLOOD GLUCOSE TEST STRIPS) STRP, 1 each by In Vitro route in the morning, at noon, and at bedtime. May substitute to any manufacturer covered by patient's insurance., Disp: 100 strip, Rfl: 3   Lancet Device MISC, 1 each by Does not apply route in the morning, at noon, and at bedtime. May substitute to  any manufacturer covered by patient's insurance., Disp: 1 each, Rfl: 0   Lancets Misc. MISC, 1 each by Does not apply route in the morning, at noon, and at bedtime. May substitute to any manufacturer covered by patient's insurance., Disp: 100 each, Rfl: 3   mupirocin ointment (BACTROBAN) 2 %, Apply 1 application. topically 2 (two) times daily., Disp: 30 g, Rfl: 2   norethindrone (AYGESTIN) 5 MG tablet, TAKE 15MG  ONCE PER DAY UNTIL BLEEDING STOPS. ONCE BLEEDING STOPS CAN GO BACK 10 MG., Disp: 180 tablet, Rfl: 12   silver sulfADIAZINE (SILVADENE) 1 % cream, APPLY TOPICALLY TO AFFECTED AREA EVERY DAY, Disp: 50 g, Rfl: 0   XARELTO 20 MG TABS tablet, TAKE 1 TABLET BY MOUTH DAILY WITH SUPPER, Disp: 90 tablet, Rfl: 3   amoxicillin-clavulanate (AUGMENTIN) 875-125 MG tablet, Take 1 tablet by mouth 2 (two) times daily. (Patient not taking: Reported on 11/09/2023), Disp: 14 tablet, Rfl: 0   furosemide (LASIX) 40 MG tablet, TAKE 1 TABLET BY MOUTH DAILY AS NEEDED (Patient not taking: Reported on 11/09/2023), Disp: 90 tablet, Rfl: 0    There were no vitals taken for this visit. There is no height or weight on file to calculate BMI.   Diabetes Self-Management Education - 11/09/23 1044       Visit Information   Visit Type First/Initial      Initial Visit   Diabetes  Type Type 2    Date Diagnosed 2025    Are you currently following a meal plan? No    Are you taking your medications as prescribed? Not on Medications      Health Coping   How would you rate your overall health? Fair      Psychosocial Assessment   Patient Belief/Attitude about Diabetes Motivated to manage diabetes    What is the hardest part about your diabetes right now, causing you the most concern, or is the most worrisome to you about your diabetes?   Making healty food and beverage choices;Checking blood sugar    Self-care barriers None    Self-management support Doctor's office    Other persons present Patient    Patient Concerns  Nutrition/Meal planning;Monitoring;Problem Solving;Support    Special Needs None    Preferred Learning Style No preference indicated    Learning Readiness Ready    How often do you need to have someone help you when you read instructions, pamphlets, or other written materials from your doctor or pharmacy? 1 - Never    What is the last grade level you completed in school? college      Pre-Education Assessment   Patient understands the diabetes disease and treatment process. Needs Instruction    Patient understands incorporating nutritional management into lifestyle. Needs Instruction    Patient undertands incorporating physical activity into lifestyle. Needs Instruction    Patient understands using medications safely. Needs Instruction    Patient understands monitoring blood glucose, interpreting and using results Needs Instruction    Patient understands prevention, detection, and treatment of acute complications. Needs Instruction    Patient understands prevention, detection, and treatment of chronic complications. Needs Instruction    Patient understands how to develop strategies to address psychosocial issues. Needs Instruction    Patient understands how to develop strategies to promote health/change behavior. Needs Instruction      Complications   Last HgB A1C per patient/outside source 6.7 %    How often do you check your blood sugar? 1-2 times/day    Fasting Blood glucose range (mg/dL) 161-096;04-540    Postprandial Blood glucose range (mg/dL) 981-191    Number of hypoglycemic episodes per month 0    Have you had a dilated eye exam in the past 12 months? No    Have you had a dental exam in the past 12 months? Yes    Are you checking your feet? Yes    How many days per week are you checking your feet? 7      Dietary Intake   Breakfast 6 crakers, peanut butter, fruit cup, zero minute maid or grapes or skips 2/d/w    Lunch [place], 2 slices of white or wheat bread, mayp, lettuce, zero  fresca    Snack (afternoon) nuts or kettle chips    Dinner salmon, yellow rice, asparagus, zero fresca    Snack (evening) chips or none    Beverage(s) [place], sweet tea, water, MM zer      Activity / Exercise   Activity / Exercise Type Light (walking / raking leaves);Moderate (swimming / aerobic walking)    How many days per week do you exercise? 3    How many minutes per day do you exercise? 30    Total minutes per week of exercise 90      Patient Education   Previous Diabetes Education No    Disease Pathophysiology Definition of diabetes, type 1 and 2, and the diagnosis of diabetes  Healthy Eating Plate Method;Role of diet in the treatment of diabetes and the relationship between the three main macronutrients and blood glucose level;Food label reading, portion sizes and measuring food.;Reviewed blood glucose goals for pre and post meals and how to evaluate the patients' food intake on their blood glucose level.    Being Active Role of exercise on diabetes management, blood pressure control and cardiac health.    Medications Other (comment)   n/a   Monitoring Identified appropriate SMBG and/or A1C goals.    Acute complications Other (comment)   signs symtpoms of hyper/hypoglycemia   Chronic complications Relationship between chronic complications and blood glucose control;Assessed and discussed foot care and prevention of foot problems    Diabetes Stress and Support Worked with patient to identify barriers to care and solutions;Role of stress on diabetes    Preconception care Other (comment)   n/a   Lifestyle and Health Coping Lifestyle issues that need to be addressed for better diabetes care      Individualized Goals (developed by patient)   Nutrition Follow meal plan discussed    Physical Activity Exercise 3-5 times per week;30 minutes per day    Medications Not Applicable    Monitoring  Test my blood glucose as discussed    Problem Solving Eating Pattern    Reducing Risk examine  blood glucose patterns;do foot checks daily    Health Coping Ask for help with psychological, social, or emotional issues      Post-Education Assessment   Patient understands the diabetes disease and treatment process. Needs Review    Patient understands incorporating nutritional management into lifestyle. Needs Review    Patient undertands incorporating physical activity into lifestyle. Comprehends key points    Patient understands using medications safely. Needs Instruction    Patient understands monitoring blood glucose, interpreting and using results Comprehends key points    Patient understands prevention, detection, and treatment of acute complications. Comprehends key points    Patient understands prevention, detection, and treatment of chronic complications. Comprehends key points    Patient understands how to develop strategies to address psychosocial issues. Needs Review    Patient understands how to develop strategies to promote health/change behavior. Needs Review      Outcomes   Expected Outcomes Demonstrated interest in learning. Expect positive outcomes    Future DMSE 3-4 months    Program Status Not Completed             Individualized Plan for Diabetes Self-Management Training:   Learning Objective:  Patient will have a greater understanding of diabetes self-management. Patient education plan is to attend individual and/or group sessions per assessed needs and concerns.   Plan:   There are no Patient Instructions on file for this visit.  Expected Outcomes:  Demonstrated interest in learning. Expect positive outcomes  Education material provided: ADA - How to Thrive: A Guide for Your Journey with Diabetes, My Plate, and Snack sheet  If problems or questions, patient to contact team via:  Phone  Future DSME appointment: 3-4 months

## 2023-12-05 ENCOUNTER — Other Ambulatory Visit: Payer: Self-pay | Admitting: Internal Medicine

## 2023-12-07 ENCOUNTER — Ambulatory Visit
Admission: RE | Admit: 2023-12-07 | Discharge: 2023-12-07 | Disposition: A | Discharge status: Home / Self Care | Payer: Self-pay | Source: Ambulatory Visit | Attending: Obstetrics & Gynecology | Admitting: Obstetrics & Gynecology

## 2023-12-07 DIAGNOSIS — Z1231 Encounter for screening mammogram for malignant neoplasm of breast: Secondary | ICD-10-CM

## 2023-12-13 ENCOUNTER — Encounter: Payer: Self-pay | Admitting: Obstetrics & Gynecology

## 2023-12-24 ENCOUNTER — Encounter: Payer: Self-pay | Admitting: Hematology and Oncology

## 2024-01-18 ENCOUNTER — Encounter: Payer: Self-pay | Admitting: Hematology and Oncology

## 2024-01-18 NOTE — Telephone Encounter (Signed)
 Patient made aware, patient declined appointments offered and stated she would be okay waiting til her Physical Appointment 01/18/24 for medication discussion.

## 2024-01-22 ENCOUNTER — Ambulatory Visit: Payer: 59 | Admitting: Internal Medicine

## 2024-02-04 ENCOUNTER — Encounter: Payer: Self-pay | Admitting: Hematology and Oncology

## 2024-02-06 ENCOUNTER — Ambulatory Visit: Admitting: Podiatry

## 2024-02-06 ENCOUNTER — Encounter: Payer: Self-pay | Admitting: Podiatry

## 2024-02-06 DIAGNOSIS — L600 Ingrowing nail: Secondary | ICD-10-CM

## 2024-02-07 ENCOUNTER — Other Ambulatory Visit: Payer: Self-pay | Admitting: Obstetrics & Gynecology

## 2024-02-07 DIAGNOSIS — D259 Leiomyoma of uterus, unspecified: Secondary | ICD-10-CM

## 2024-02-10 NOTE — Progress Notes (Signed)
 Chief Complaint  Patient presents with   Ingrown Toenail    RM#6 Right great toe nail ingrown going on 2 weeks of symptoms of discharge and pain discoloration of nail bed area. Last A1C 6.7    Subjective: Patient presents today for evaluation of pain to the right great toe. Patient is concerned for possible ingrown nail.  It is very sensitive to touch.  Patient presents today for further treatment and evaluation.  Past Medical History:  Diagnosis Date   Anemia 09/2017   REQUIRING A TRANSFUSION   Antiphospholipid syndrome (HCC)    Breast cyst    BILATERAL   DVT (deep venous thrombosis) (HCC)    Dysfunctional uterine bleeding    Fibroids    Obesity     Past Surgical History:  Procedure Laterality Date   CESAREAN SECTION     DILITATION & CURRETTAGE/HYSTROSCOPY WITH HYDROTHERMAL ABLATION N/A 10/08/2014   Procedure: DILATATION & CURETTAGE/HYSTEROSCOPY WITH HYDROTHERMAL ABLATION;  Surgeon: Julianne Octave, MD;  Location: [facility];  Service: Gynecology;  Laterality: N/A;   IR ANGIOGRAM PELVIS SELECTIVE OR SUPRASELECTIVE  12/03/2019   IR ANGIOGRAM PELVIS SELECTIVE OR SUPRASELECTIVE  12/03/2019   IR ANGIOGRAM SELECTIVE EACH ADDITIONAL VESSEL  12/03/2019   IR ANGIOGRAM SELECTIVE EACH ADDITIONAL VESSEL  12/03/2019   IR EMBO TUMOR ORGAN ISCHEMIA INFARCT INC GUIDE ROADMAPPING  12/03/2019   IR RADIOLOGIST EVAL & MGMT  10/10/2017   IR RADIOLOGIST EVAL & MGMT  11/28/2017   IR RADIOLOGIST EVAL & MGMT  12/12/2017   IR RADIOLOGIST EVAL & MGMT  08/07/2019   IR RADIOLOGIST EVAL & MGMT  10/15/2019   IR SINUS/FIST TUBE CHK-NON GI  12/26/2017   IR US  GUIDE VASC ACCESS LEFT  12/03/2019   IR US  GUIDE VASC ACCESS RIGHT  12/03/2019   MYOMECTOMY      No Known Allergies  Objective:  General: Well developed, nourished, in no acute distress, alert and oriented x3   Dermatology: Skin is warm, dry and supple bilateral.  Right great toe is tender with evidence of an ingrowing nail. Pain on palpation noted to the border of  the nail fold. The remaining nails appear unremarkable at this time.   Vascular: DP and PT pulses palpable.  No clinical evidence of vascular compromise  Neruologic: Grossly intact via light touch bilateral.  Musculoskeletal: No pedal deformity noted  Assesement: #1 Paronychia with ingrowing nail right great toe lateral  Plan of Care:  -Patient evaluated.  -Discussed treatment alternatives and plan of care. Explained nail avulsion procedure and post procedure course to patient. -Patient opted for permanent partial nail avulsion of the ingrown portion of the nail.  -Prior to procedure, local anesthesia infiltration utilized using 3 ml of a 50:50 mixture of 2% plain lidocaine  and 0.5% plain marcaine  in a normal hallux block fashion and a betadine prep performed.  -Partial permanent nail avulsion with chemical matrixectomy performed using 3x30sec applications of phenol followed by alcohol flush.  -Light dressing applied.  Post care instructions provided -Return to clinic 3 weeks  Dot Gazella, DPM [facility]  Dr. Dot Gazella, DPM    [address], [place] [address]                Office [phone]  Fax [phone]

## 2024-02-11 ENCOUNTER — Encounter: Payer: Self-pay | Admitting: Internal Medicine

## 2024-02-11 ENCOUNTER — Ambulatory Visit: Admitting: Internal Medicine

## 2024-02-11 ENCOUNTER — Ambulatory Visit: Payer: Self-pay | Admitting: Internal Medicine

## 2024-02-11 VITALS — BP 116/84 | HR 94 | Temp 98.8°F | Ht 68.5 in | Wt 245.0 lb

## 2024-02-11 DIAGNOSIS — E1159 Type 2 diabetes mellitus with other circulatory complications: Secondary | ICD-10-CM

## 2024-02-11 DIAGNOSIS — I82409 Acute embolism and thrombosis of unspecified deep veins of unspecified lower extremity: Secondary | ICD-10-CM

## 2024-02-11 DIAGNOSIS — I872 Venous insufficiency (chronic) (peripheral): Secondary | ICD-10-CM

## 2024-02-11 HISTORY — DX: Type 2 diabetes mellitus with other circulatory complications: E11.59

## 2024-02-11 LAB — POCT GLYCOSYLATED HEMOGLOBIN (HGB A1C): Hemoglobin A1C: 6.3 % — AB (ref 4.0–5.6)

## 2024-02-11 LAB — HM DIABETES FOOT EXAM

## 2024-02-11 NOTE — Assessment & Plan Note (Signed)
 A1c down to 6.3% with lifestyle changes Will go for nutrition refresher Discussed GLP-1 agonists---wishes to hold off Not excited about statin--will talk again at physical

## 2024-02-11 NOTE — Progress Notes (Signed)
 Subjective:    Patient ID: [name], female    DOB: November 26, 1975, 48 y.o.   MRN: [id number]  HPI Here for review of recent diagnosis of diabetes  Did have one session with diabetic educator May have refresher over time Has lost 9# since last visit  Current Outpatient Medications on File Prior to Visit  Medication Sig Dispense Refill   amoxicillin -clavulanate (AUGMENTIN ) 875-125 MG tablet Take 1 tablet by mouth 2 (two) times daily. 14 tablet 0   Blood Glucose Monitoring Suppl DEVI 1 each by Does not apply route in the morning, at noon, and at bedtime. May substitute to any manufacturer covered by patient's insurance. 1 each 0   furosemide  (LASIX ) 40 MG tablet TAKE 1 TABLET BY MOUTH DAILY AS NEEDED 90 tablet 0   Glucose Blood (BLOOD GLUCOSE TEST STRIPS) STRP 1 each by In Vitro route in the morning, at noon, and at bedtime. May substitute to any manufacturer covered by patient's insurance. 100 strip 3   mupirocin  ointment (BACTROBAN ) 2 % Apply 1 application. topically 2 (two) times daily. 30 g 2   norethindrone  (AYGESTIN ) 5 MG tablet TAKE 15MG  ONCE PER DAY UNTIL BLEEDING STOPS. ONCE BLEEDING STOPS CAN GO BACK 10 MG. 180 tablet 12   silver  sulfADIAZINE  (SILVADENE ) 1 % cream APPLY TOPICALLY TO AFFECTED AREA EVERY DAY 50 g 0   XARELTO  20 MG TABS tablet TAKE 1 TABLET BY MOUTH DAILY WITH SUPPER 90 tablet 3   No current facility-administered medications on file prior to visit.    No Known Allergies  Past Medical History:  Diagnosis Date   Anemia 09/2017   REQUIRING A TRANSFUSION   Antiphospholipid syndrome (HCC)    Breast cyst    BILATERAL   DVT (deep venous thrombosis) (HCC)    Dysfunctional uterine bleeding    Fibroids    Obesity     Past Surgical History:  Procedure Laterality Date   CESAREAN SECTION     DILITATION & CURRETTAGE/HYSTROSCOPY WITH HYDROTHERMAL ABLATION N/A 10/08/2014   Procedure: DILATATION & CURETTAGE/HYSTEROSCOPY WITH HYDROTHERMAL ABLATION;   Surgeon: Julianne Octave, MD;  Location: [facility];  Service: Gynecology;  Laterality: N/A;   IR ANGIOGRAM PELVIS SELECTIVE OR SUPRASELECTIVE  12/03/2019   IR ANGIOGRAM PELVIS SELECTIVE OR SUPRASELECTIVE  12/03/2019   IR ANGIOGRAM SELECTIVE EACH ADDITIONAL VESSEL  12/03/2019   IR ANGIOGRAM SELECTIVE EACH ADDITIONAL VESSEL  12/03/2019   IR EMBO TUMOR ORGAN ISCHEMIA INFARCT INC GUIDE ROADMAPPING  12/03/2019   IR RADIOLOGIST EVAL & MGMT  10/10/2017   IR RADIOLOGIST EVAL & MGMT  11/28/2017   IR RADIOLOGIST EVAL & MGMT  12/12/2017   IR RADIOLOGIST EVAL & MGMT  08/07/2019   IR RADIOLOGIST EVAL & MGMT  10/15/2019   IR SINUS/FIST TUBE CHK-NON GI  12/26/2017   IR US  GUIDE VASC ACCESS LEFT  12/03/2019   IR US  GUIDE VASC ACCESS RIGHT  12/03/2019   MYOMECTOMY      Family History  Problem Relation Age of Onset   Diabetes Paternal Grandmother    Heart disease Neg Hx    Hypertension Neg Hx    Cancer Neg Hx        breast or colon cancer   Colon polyps Neg Hx    Esophageal cancer Neg Hx    Stomach cancer Neg Hx     Social History   Socioeconomic History   Marital status: Married    Spouse name: Not on file   Number of children: 1  Years of education: Not on file   Highest education level: Not on file  Occupational History   Occupation: School counselor - [employer]    Comment: Counselor  Tobacco Use   Smoking status: Never    Passive exposure: Past   Smokeless tobacco: Never  Vaping Use   Vaping status: Never Used  Substance and Sexual Activity   Alcohol use: Not Currently    Comment: rare   Drug use: No   Sexual activity: Yes    Partners: Male    Birth control/protection: None  Other Topics Concern   Not on file  Social History Narrative   Not on file   Social Drivers of Health   Financial Resource Strain: Not on file  Food Insecurity: No Food Insecurity (11/09/2023)   Hunger Vital Sign    Worried About Running Out of Food in the Last Year: Never true    Ran Out of Food in the Last Year:  Never true  Transportation Needs: Not on file  Physical Activity: Not on file  Stress: Not on file  Social Connections: Not on file  Intimate Partner Violence: Not on file   Review of Systems No foot numbness or burning Some mild hand symptoms Sleeps okay Still has to keep legs wrapped and uses cream. Some inflamed feeling     Objective:   Physical Exam  Skin:    Comments: Left leg stasis changes Ingrown toenail--left great. No other lesions   Neurological:     Comments: Normal sensation in feet           Assessment & Plan:

## 2024-02-11 NOTE — Assessment & Plan Note (Signed)
 Some chronic leg pain as well Continues on long term xarelto  20mg  daily

## 2024-02-11 NOTE — Addendum Note (Signed)
 Addended by: Franne Ivory on: 02/11/2024 11:04 AM   Modules accepted: Orders

## 2024-02-11 NOTE — Assessment & Plan Note (Signed)
 Hasn't needed the furosemide  lately

## 2024-02-18 ENCOUNTER — Encounter: Payer: BC Managed Care – PPO | Admitting: Internal Medicine

## 2024-02-18 ENCOUNTER — Other Ambulatory Visit: Payer: Self-pay | Admitting: *Deleted

## 2024-02-18 DIAGNOSIS — D259 Leiomyoma of uterus, unspecified: Secondary | ICD-10-CM

## 2024-02-18 MED ORDER — NORETHINDRONE ACETATE 5 MG PO TABS
ORAL_TABLET | ORAL | 0 refills | Status: DC
Start: 2024-02-18 — End: 2024-04-11

## 2024-02-25 ENCOUNTER — Ambulatory Visit: Admitting: Podiatry

## 2024-02-25 DIAGNOSIS — L600 Ingrowing nail: Secondary | ICD-10-CM | POA: Diagnosis not present

## 2024-02-25 MED ORDER — CEPHALEXIN 500 MG PO CAPS: 500.0000 mg | ORAL_CAPSULE | Freq: Three times a day (TID) | ORAL | 0 refills | Status: DC

## 2024-02-25 NOTE — Progress Notes (Signed)
 Subjective: Chief Complaint  Patient presents with   Ingrown Toenail    RM#11 Follow up right foot great toe ingrown nail doing well just some discoloration on nail.   48 year old female presents the office today for follow-up evaluation after ongoing partial nail avulsion with Dr. Janit.  She is still concerned with some discoloration around the corner.  Not any significant pain.  She still been soaking in Epsom salts.  Objective: AAO x3, NAD DP/PT pulses palpable bilaterally, CRT less than 3 seconds Status post partial nail avulsion right lateral hallux nail border localized edema still present.  There are some scabbing noted.  Upon debridement small clear drainage expressed but no frank purulence.  No ascending cellulitis.  No fluctuation. No pain with calf compression, swelling, warmth, erythema  Assessment: Status post partial nail avulsion with localized edema  Plan: -All treatment options discussed with the patient including all alternatives, risks, complications.  -I curetted the nail border without any complications or bleeding.  Continue soaking Epsom salts, then medical management drainage.  Prescribed cephalexin .  -Monitor for any clinical signs or symptoms of infection and directed to call the office immediately should any occur or go to the ER. -Patient encouraged to call the office with any questions, concerns, change in symptoms.   Return in about 3 weeks (around 03/17/2024), or if symptoms worsen or fail to improve, for ingrown toenail.  Donnice JONELLE Fees DPM

## 2024-02-25 NOTE — Patient Instructions (Signed)
 Continue soaking in epsom salts twice a day followed by antibiotic ointment and a band-aid. Can leave uncovered at night. Continue this until completely healed.  If the area has not healed in 2 weeks, call the office for follow-up appointment, or sooner if any problems arise.  Monitor for any signs/symptoms of infection. Call the office immediately if any occur or go directly to the emergency room. Call with any questions/concerns.

## 2024-03-13 ENCOUNTER — Ambulatory Visit: Admitting: Obstetrics & Gynecology

## 2024-03-13 ENCOUNTER — Encounter: Payer: Self-pay | Admitting: Obstetrics & Gynecology

## 2024-03-13 VITALS — BP 155/107 | HR 97 | Ht 69.0 in | Wt 253.0 lb

## 2024-03-13 DIAGNOSIS — R030 Elevated blood-pressure reading, without diagnosis of hypertension: Secondary | ICD-10-CM

## 2024-03-13 DIAGNOSIS — Z01419 Encounter for gynecological examination (general) (routine) without abnormal findings: Secondary | ICD-10-CM

## 2024-03-13 DIAGNOSIS — Z1331 Encounter for screening for depression: Secondary | ICD-10-CM

## 2024-03-13 NOTE — Progress Notes (Signed)
 GYNECOLOGY ANNUAL PREVENTATIVE CARE ENCOUNTER NOTE  History:     [name] is a 48 y.o. G69P0001 female here for a routine annual exam.  Current complaints: none.  History of bilateral uterine artery embolization on 12/03/2019 for fibroids and DUB, bleeding is controlled on Aygestin  10 mg daily. Aygestin  also continued to suppress bleeding in the setting of her long term anticoagulation use given her history of recurrent DVT.  She wonders how long she needs to continue this medication.  Denies abnormal vaginal bleeding, discharge, pelvic pain, problems with intercourse or other gynecologic concerns.    Gynecologic History No LMP recorded. Patient has had an ablation. Contraception: none Last Pap: 09/20/2021. Result was normal with negative HPV Last Mammogram: 12/07/2023.  Result was normal Last Colonoscopy: 12/07/2021.   Result with benign.  Obstetric History OB History  Gravida Para Term Preterm AB Living  1 1 0 0 0 1  SAB IAB Ectopic Multiple Live Births  0 0 0 0 1    # Outcome Date GA Lbr Len/2nd Weight Sex Type Anes PTL Lv  1 Para 03/28/07    F CS-Classical   LIV    Past Medical History:  Diagnosis Date   Antiphospholipid syndrome (HCC)    Breast cyst    BILATERAL   Chronic venous insufficiency 03/24/2020   Controlled diabetes mellitus with circulatory complication, without long-term current use of insulin (HCC) 02/11/2024   Fibroids    Iron  deficiency anemia due to chronic blood loss 04/04/2018   Has required a transfusion   Obesity    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   2007, 2009, 2015, 2019  On lifelong anticoagulation     Uterine bleeding, dysfunctional 12/03/2019    Past Surgical History:  Procedure Laterality Date   CESAREAN SECTION     DILITATION & CURRETTAGE/HYSTROSCOPY WITH HYDROTHERMAL ABLATION N/A 10/08/2014   Procedure: DILATATION & CURETTAGE/HYSTEROSCOPY WITH HYDROTHERMAL ABLATION;  Surgeon: Gloris DELENA Hugger, MD;  Location:  [facility];  Service: Gynecology;  Laterality: N/A;   IR ANGIOGRAM PELVIS SELECTIVE OR SUPRASELECTIVE  12/03/2019   IR ANGIOGRAM PELVIS SELECTIVE OR SUPRASELECTIVE  12/03/2019   IR ANGIOGRAM SELECTIVE EACH ADDITIONAL VESSEL  12/03/2019   IR ANGIOGRAM SELECTIVE EACH ADDITIONAL VESSEL  12/03/2019   IR EMBO TUMOR ORGAN ISCHEMIA INFARCT INC GUIDE ROADMAPPING  12/03/2019   IR RADIOLOGIST EVAL & MGMT  10/10/2017   IR RADIOLOGIST EVAL & MGMT  11/28/2017   IR RADIOLOGIST EVAL & MGMT  12/12/2017   IR RADIOLOGIST EVAL & MGMT  08/07/2019   IR RADIOLOGIST EVAL & MGMT  10/15/2019   IR SINUS/FIST TUBE CHK-NON GI  12/26/2017   IR US  GUIDE VASC ACCESS LEFT  12/03/2019   IR US  GUIDE VASC ACCESS RIGHT  12/03/2019   MYOMECTOMY      Current Outpatient Medications on File Prior to Visit  Medication Sig Dispense Refill   Blood Glucose Monitoring Suppl DEVI 1 each by Does not apply route in the morning, at noon, and at bedtime. May substitute to any manufacturer covered by patient's insurance. 1 each 0   furosemide  (LASIX ) 40 MG tablet TAKE 1 TABLET BY MOUTH DAILY AS NEEDED 90 tablet 0   Glucose Blood (BLOOD GLUCOSE TEST STRIPS) STRP 1 each by In Vitro route in the morning, at noon, and at bedtime. May substitute to any manufacturer covered by patient's insurance. 100 strip 3   mupirocin  ointment (BACTROBAN ) 2 % Apply 1 application. topically 2 (two) times daily. 30 g 2  norethindrone  (AYGESTIN ) 5 MG tablet TAKE 15MG  ONCE PER DAY UNTIL BLEEDING STOPS. ONCE BLEEDING STOPS CAN GO BACK 10 MG. 180 tablet 12   silver  sulfADIAZINE  (SILVADENE ) 1 % cream APPLY TOPICALLY TO AFFECTED AREA EVERY DAY 50 g 0   XARELTO  20 MG TABS tablet TAKE 1 TABLET BY MOUTH DAILY WITH SUPPER 90 tablet 3   cephALEXin  (KEFLEX ) 500 MG capsule Take 1 capsule (500 mg total) by mouth 3 (three) times daily. (Patient not taking: Reported on 03/13/2024) 21 capsule 0   norethindrone  (AYGESTIN ) 5 MG tablet TAKE 15MG  ONCE PER DAY UNTIL BLEEDING STOPS. ONCE BLEEDING STOPS CAN  GO BACK 10 MG. (Patient not taking: Reported on 03/13/2024) 180 tablet 0   No current facility-administered medications on file prior to visit.    No Known Allergies  Social History:  reports that she has never smoked. She has been exposed to tobacco smoke. She has never used smokeless tobacco. She reports that she does not currently use alcohol. She reports that she does not use drugs.  Family History  Problem Relation Age of Onset   Diabetes Paternal Grandmother    Heart disease Neg Hx    Hypertension Neg Hx    Cancer Neg Hx        breast or colon cancer   Colon polyps Neg Hx    Esophageal cancer Neg Hx    Stomach cancer Neg Hx     The following portions of the patient's history were reviewed and updated as appropriate: allergies, current medications, past family history, past medical history, past social history, past surgical history and problem list.  Review of Systems Pertinent items noted in HPI and remainder of comprehensive ROS otherwise negative.  Physical Exam:  BP (!) 155/107   Pulse 97   Ht 5' 9 (1.753 m)   Wt 253 lb (114.8 kg)   BMI 37.36 kg/m  Vitals:   03/13/24 1316 03/13/24 1335  BP: (!) 148/99 (!) 155/107   CONSTITUTIONAL: Well-developed, well-nourished female in no acute distress.  HENT:  Normocephalic, atraumatic, External right and left ear normal.  EYES: Conjunctivae and EOM are normal. Pupils are equal, round, and reactive to light. No scleral icterus.  NECK: Normal range of motion, supple, no masses.  Normal thyroid .  SKIN: Skin is warm and dry. No rash noted. Not diaphoretic. No erythema. No pallor. MUSCULOSKELETAL: Normal range of motion. No tenderness.  No cyanosis, clubbing, or edema. NEUROLOGIC: Alert and oriented to person, place, and time. Normal reflexes, muscle tone coordination.  PSYCHIATRIC: Normal mood and affect. Normal behavior. Normal judgment and thought content. CARDIOVASCULAR: Normal heart rate noted, regular rhythm RESPIRATORY:  Clear to auscultation bilaterally. Effort and breath sounds normal, no problems with respiration noted. BREASTS: Deferred by patient ABDOMEN: Soft, no distention noted.  No tenderness, rebound or guarding.  PELVIC: Deferred by patient   Assessment and Plan:     1. Elevated blood pressure reading in office without diagnosis of hypertension Elevated BPs here, patient advised to follow up with her PCP for further evaluation and management  2. Encounter for well woman exam (Primary) Pap, mammogram and colon cancer screening are all up to date. Continue Aygestin  10 mg daily for control of bleeding.  She was told that she can try 5 mg to see if this can control her bleeding.  If this works, can continue with the reduced dose. If not, continue 10 mg daily.  Patient told that the Aygestin  will be needed until she is menopausal. Routine preventative  health maintenance measures emphasized. Please refer to After Visit Summary for other counseling recommendations.      GLORIS HUGGER, MD, FACOG Obstetrician & Gynecologist, [facility] for [employer], [facility]

## 2024-03-20 ENCOUNTER — Ambulatory Visit: Admitting: Podiatry

## 2024-03-20 DIAGNOSIS — L03031 Cellulitis of right toe: Secondary | ICD-10-CM | POA: Diagnosis not present

## 2024-03-20 NOTE — Patient Instructions (Signed)

## 2024-03-30 ENCOUNTER — Other Ambulatory Visit: Payer: Self-pay | Admitting: Internal Medicine

## 2024-04-10 ENCOUNTER — Other Ambulatory Visit: Payer: Self-pay | Admitting: Obstetrics & Gynecology

## 2024-04-10 DIAGNOSIS — D259 Leiomyoma of uterus, unspecified: Secondary | ICD-10-CM

## 2024-04-14 ENCOUNTER — Other Ambulatory Visit (HOSPITAL_COMMUNITY): Payer: Self-pay

## 2024-04-14 ENCOUNTER — Ambulatory Visit: Payer: Self-pay | Admitting: Internal Medicine

## 2024-04-14 ENCOUNTER — Ambulatory Visit (INDEPENDENT_AMBULATORY_CARE_PROVIDER_SITE_OTHER): Admitting: Internal Medicine

## 2024-04-14 ENCOUNTER — Encounter: Payer: Self-pay | Admitting: Hematology and Oncology

## 2024-04-14 ENCOUNTER — Encounter: Payer: Self-pay | Admitting: Internal Medicine

## 2024-04-14 ENCOUNTER — Telehealth: Payer: Self-pay

## 2024-04-14 VITALS — BP 128/84 | HR 84 | Temp 98.8°F | Ht 68.5 in | Wt 245.0 lb

## 2024-04-14 DIAGNOSIS — E1159 Type 2 diabetes mellitus with other circulatory complications: Secondary | ICD-10-CM | POA: Diagnosis not present

## 2024-04-14 DIAGNOSIS — Z0000 Encounter for general adult medical examination without abnormal findings: Secondary | ICD-10-CM

## 2024-04-14 DIAGNOSIS — Z7984 Long term (current) use of oral hypoglycemic drugs: Secondary | ICD-10-CM

## 2024-04-14 DIAGNOSIS — I82409 Acute embolism and thrombosis of unspecified deep veins of unspecified lower extremity: Secondary | ICD-10-CM | POA: Diagnosis not present

## 2024-04-14 DIAGNOSIS — I872 Venous insufficiency (chronic) (peripheral): Secondary | ICD-10-CM

## 2024-04-14 LAB — CBC
HCT: 47.9 % — ABNORMAL HIGH (ref 36.0–46.0)
Hemoglobin: 15.9 g/dL — ABNORMAL HIGH (ref 12.0–15.0)
MCHC: 33.1 g/dL (ref 30.0–36.0)
MCV: 83.4 fl (ref 78.0–100.0)
Platelets: 273 K/uL (ref 150.0–400.0)
RBC: 5.74 Mil/uL — ABNORMAL HIGH (ref 3.87–5.11)
RDW: 14.3 % (ref 11.5–15.5)
WBC: 4.4 K/uL (ref 4.0–10.5)

## 2024-04-14 LAB — LIPID PANEL
Cholesterol: 207 mg/dL — ABNORMAL HIGH (ref 0–200)
HDL: 25.3 mg/dL — ABNORMAL LOW (ref >39.00)
LDL Cholesterol: 159 mg/dL — ABNORMAL HIGH (ref 0–99)
NonHDL: 181.85
Total CHOL/HDL Ratio: 8
Triglycerides: 112 mg/dL (ref 0.0–149.0)
VLDL: 22.4 mg/dL (ref 0.0–40.0)

## 2024-04-14 LAB — COMPREHENSIVE METABOLIC PANEL WITH GFR
ALT: 15 U/L (ref 0–35)
AST: 14 U/L (ref 0–37)
Albumin: 4.3 g/dL (ref 3.5–5.2)
Alkaline Phosphatase: 59 U/L (ref 39–117)
BUN: 8 mg/dL (ref 6–23)
CO2: 28 meq/L (ref 19–32)
Calcium: 9.7 mg/dL (ref 8.4–10.5)
Chloride: 106 meq/L (ref 96–112)
Creatinine, Ser: 0.87 mg/dL (ref 0.40–1.20)
GFR: 78.28 mL/min (ref >60.00)
Glucose, Bld: 93 mg/dL (ref 70–99)
Potassium: 4 meq/L (ref 3.5–5.1)
Sodium: 141 meq/L (ref 135–145)
Total Bilirubin: 0.5 mg/dL (ref 0.2–1.2)
Total Protein: 7.8 g/dL (ref 6.0–8.3)

## 2024-04-14 LAB — MICROALBUMIN / CREATININE URINE RATIO
Creatinine,U: 241.3 mg/dL
Microalb Creat Ratio: 7.3 mg/g (ref 0.0–30.0)
Microalb, Ur: 1.8 mg/dL (ref 0.0–1.9)

## 2024-04-14 LAB — HEMOGLOBIN A1C: Hgb A1c MFr Bld: 6.8 % — ABNORMAL HIGH (ref 4.6–6.5)

## 2024-04-14 MED ORDER — RYBELSUS 3 MG PO TABS: 3.0000 mg | ORAL_TABLET | Freq: Every day | ORAL | 0 refills | Status: DC

## 2024-04-14 MED ORDER — SILVER SULFADIAZINE 1 % EX CREA: TOPICAL_CREAM | CUTANEOUS | 0 refills | Status: DC

## 2024-04-14 MED ORDER — RYBELSUS 7 MG PO TABS: 7.0000 mg | ORAL_TABLET | Freq: Every day | ORAL | 3 refills | Status: DC

## 2024-04-14 NOTE — Assessment & Plan Note (Signed)
 Furosemide  prn--but hasn't needed

## 2024-04-14 NOTE — Assessment & Plan Note (Signed)
 Working on fitness Yearly mammogram in April Colon due 2033 Pap per gyn Recommended flu/COVID vaccines--she isn't excited about these

## 2024-04-14 NOTE — Telephone Encounter (Signed)
 Pharmacy Patient Advocate Encounter   Received notification from Onbase that prior authorization for Rybelsus  3 is required/requested.   Insurance verification completed.   The patient is insured through [facility] .   Per test claim: PA required; PA submitted to above mentioned insurance via Latent Key/confirmation #/EOC [id number] Status is pending

## 2024-04-14 NOTE — Progress Notes (Signed)
 Subjective:    Patient ID: [name], female    DOB: 07-Jan-1976, 48 y.o.   MRN: [id number]  HPI Here for physical  Sugars are going up a bit-- 130's, the highest 150 Considering medication---may be ready to try GLP-1 agonist No foot numbness or pain  Leg swelling is controlled Walking more Wears support hose at times  Current Outpatient Medications on File Prior to Visit  Medication Sig Dispense Refill   ACCU-CHEK GUIDE TEST test strip USE AS DIRECTED IN THE MORNING, AT NOON AND AT BEDTIME 100 strip 3   Blood Glucose Monitoring Suppl DEVI 1 each by Does not apply route in the morning, at noon, and at bedtime. May substitute to any manufacturer covered by patient's insurance. 1 each 0   norethindrone  (AYGESTIN ) 5 MG tablet TAKE 15MG  ONCE PER DAY UNTIL BLEEDING STOPS. ONCE BLEEDING STOPS CAN GO BACK 10 MG. 270 tablet 1   silver  sulfADIAZINE  (SILVADENE ) 1 % cream APPLY TOPICALLY TO AFFECTED AREA EVERY DAY 50 g 0   XARELTO  20 MG TABS tablet TAKE 1 TABLET BY MOUTH DAILY WITH SUPPER 90 tablet 3   No current facility-administered medications on file prior to visit.    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   2007, 2009, 2015, 2019  On lifelong anticoagulation     Uterine bleeding, dysfunctional 12/03/2019    Past Surgical History:  Procedure Laterality Date   CESAREAN SECTION     DILITATION & CURRETTAGE/HYSTROSCOPY WITH HYDROTHERMAL ABLATION N/A 10/08/2014   Procedure: DILATATION & CURETTAGE/HYSTEROSCOPY WITH HYDROTHERMAL ABLATION;  Surgeon: Gloris DELENA Hugger, MD;  Location: [facility];  Service: Gynecology;  Laterality: N/A;   IR ANGIOGRAM PELVIS SELECTIVE OR SUPRASELECTIVE  12/03/2019   IR ANGIOGRAM PELVIS SELECTIVE OR SUPRASELECTIVE  12/03/2019   IR ANGIOGRAM SELECTIVE EACH ADDITIONAL VESSEL  12/03/2019   IR ANGIOGRAM SELECTIVE EACH ADDITIONAL VESSEL  12/03/2019   IR EMBO TUMOR ORGAN ISCHEMIA INFARCT INC GUIDE ROADMAPPING  12/03/2019   IR RADIOLOGIST EVAL & MGMT  10/10/2017   IR RADIOLOGIST EVAL & MGMT  11/28/2017   IR RADIOLOGIST EVAL & MGMT  12/12/2017   IR RADIOLOGIST EVAL & MGMT  08/07/2019   IR RADIOLOGIST EVAL & MGMT  10/15/2019   IR SINUS/FIST TUBE CHK-NON GI  12/26/2017   IR US  GUIDE VASC ACCESS LEFT  12/03/2019   IR US  GUIDE VASC ACCESS RIGHT  12/03/2019   MYOMECTOMY      Family History  Problem Relation Age of Onset   Diabetes Paternal Grandmother    Heart disease Neg Hx    Hypertension Neg Hx    Cancer Neg Hx        breast or colon cancer   Colon polyps Neg Hx    Esophageal cancer Neg Hx    Stomach cancer Neg Hx     Social History   Socioeconomic History   Marital status: Married    Spouse name: Not on file   Number of children: 1   Years of education: Not on file   Highest education level: Not on  file  Occupational History   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  Tobacco Use   Smoking status: Never    Passive exposure: Past   Smokeless tobacco: Never  Vaping Use   Vaping status: Never Used  Substance and Sexual Activity   Alcohol use: Not Currently    Comment: rare   Drug use: No   Sexual activity: Yes    Partners: Male    Birth control/protection: None  Other Topics Concern   Not on file  Social History Narrative   Not on file   Social Drivers of Health   Financial Resource Strain: Not on file  Food Insecurity: No Food Insecurity (11/09/2023)   Hunger Vital Sign    Worried About Running Out of Food in the Last Year: Never true    Ran Out of Food in the Last Year: Never true   Transportation Needs: Not on file  Physical Activity: Not on file  Stress: Not on file  Social Connections: Not on file  Intimate Partner Violence: Not on file   Review of Systems  Constitutional:  Negative for fatigue.       Wears seat belt  HENT:  Negative for dental problem, hearing loss and tinnitus.        Keeps up with dentist  Eyes:  Negative for visual disturbance.       No diplopia or unilateral vision loss  Respiratory:  Negative for cough, chest tightness and shortness of breath.   Cardiovascular:  Positive for leg swelling. Negative for chest pain and palpitations.  Gastrointestinal:  Negative for blood in stool and constipation.       No heartburn  Endocrine: Negative for polydipsia and polyuria.  Genitourinary:  Positive for frequency. Negative for dyspareunia, dysuria and hematuria.  Musculoskeletal:  Positive for arthralgias. Negative for back pain and joint swelling.       Some knee pain---better now. Did see sports medicine  Skin:  Negative for rash.  Allergic/Immunologic: Negative for environmental allergies and immunocompromised state.  Neurological:  Negative for dizziness, syncope, light-headedness and headaches.  Hematological:  Negative for adenopathy. Does not bruise/bleed easily.  Psychiatric/Behavioral:  Negative for dysphoric mood and sleep disturbance. The patient is not nervous/anxious.        Objective:   Physical Exam Constitutional:      Appearance: Normal appearance.  HENT:     Mouth/Throat:     Pharynx: No oropharyngeal exudate or posterior oropharyngeal erythema.  Eyes:     Conjunctiva/sclera: Conjunctivae normal.     Pupils: Pupils are equal, round, and reactive to light.  Cardiovascular:     Rate and Rhythm: Normal rate and regular rhythm.     Pulses: Normal pulses.     Heart sounds: No murmur heard.    No gallop.  Pulmonary:     Effort: Pulmonary effort is normal.     Breath sounds: Normal breath sounds. No wheezing or rales.   Abdominal:     Palpations: Abdomen is soft.     Tenderness: There is no abdominal tenderness.  Musculoskeletal:     Cervical back: Neck supple.     Right lower leg: No edema.     Left lower leg: No edema.  Lymphadenopathy:     Cervical: No cervical adenopathy.  Skin:    Findings: No rash.  Neurological:     General: No focal deficit present.     Mental Status: She is alert and oriented to person, place, and time.  Psychiatric:        Mood and Affect: Mood normal.        Behavior: Behavior normal.            Assessment & Plan:

## 2024-04-14 NOTE — Assessment & Plan Note (Signed)
 Has been controlled with xarelto  20mg  daily

## 2024-04-14 NOTE — Telephone Encounter (Signed)
 Pharmacy Patient Advocate Encounter  Received notification from [facility] that Prior Authorization for Rybelsus  3 has been APPROVED from 04/14/24 to 04/15/27. Ran test claim, Copay is $14.99. This test claim was processed through [facility]- copay amounts may vary at other pharmacies due to pharmacy/plan contracts, or as the patient moves through the different stages of their insurance plan.   PA #/Case ID/Reference #: # [id number]

## 2024-04-14 NOTE — Assessment & Plan Note (Addendum)
 Somewhat higher Discussed options--will try her on rybelsus  Still wants to hold off on statin

## 2024-04-18 ENCOUNTER — Ambulatory Visit: Admitting: Podiatry

## 2024-04-25 LAB — HM DIABETES EYE EXAM

## 2024-05-01 ENCOUNTER — Other Ambulatory Visit (HOSPITAL_COMMUNITY): Payer: Self-pay

## 2024-05-05 ENCOUNTER — Ambulatory Visit: Admitting: Podiatry

## 2024-05-27 NOTE — Progress Notes (Signed)
  Subjective:     Patient ID: [name], female   DOB: [birth year], 48 y.o.   MRN: [id number]  HPI   Review of Systems     Objective:   Physical Exam     Assessment:     No charge    Plan:     No charge

## 2024-07-21 ENCOUNTER — Telehealth: Payer: Self-pay | Admitting: Internal Medicine

## 2024-07-21 NOTE — Telephone Encounter (Signed)
 Patient is scheduled for [facility] with Dr Bennett  Copied from CRM [id number]. Topic: General - Billing Inquiry >> Jul 21, 2024  8:44 AM Alexandria E wrote: Reason for CRM: Patient went to the pharmacy to pick up her Semaglutide  (RYBELSUS ) 7 MG TABS and they were going to charge her $141, when she previously paid $44 for this medication. Patient would like to know why the cost went up, also got patient scheduled for a TOC appointment 09/23/2024. OK to leave a VM if no answer.

## 2024-07-21 NOTE — Telephone Encounter (Signed)
 Spoke to pt. We would not know why it changed. Only thing I can see is that the 7 mg is a 90 day supply. She will check with the pharmacy and insurance.

## 2024-07-29 ENCOUNTER — Ambulatory Visit

## 2024-07-31 ENCOUNTER — Ambulatory Visit

## 2024-07-31 DIAGNOSIS — L02611 Cutaneous abscess of right foot: Secondary | ICD-10-CM | POA: Diagnosis not present

## 2024-07-31 DIAGNOSIS — L03031 Cellulitis of right toe: Secondary | ICD-10-CM

## 2024-07-31 MED ORDER — CEPHALEXIN 500 MG PO CAPS: 500.0000 mg | ORAL_CAPSULE | Freq: Three times a day (TID) | ORAL | 0 refills | Status: AC

## 2024-07-31 MED ORDER — MUPIROCIN 2 % EX OINT: 1.0000 | TOPICAL_OINTMENT | Freq: Two times a day (BID) | CUTANEOUS | 2 refills | Status: DC

## 2024-07-31 NOTE — Progress Notes (Unsigned)
 Subjective:  Patient ID: [name], female    DOB: Dec 06, 1975,  MRN: [id number]  [name] presents to clinic today for:  Chief Complaint  Patient presents with   Ingrown Toenail    Paronychia of great toe of right foot lateral border    Patient comes in for above complaint.  She has dealt with ingrown toenails in the past and had the right first toenail treated at least twice.  She is concerned about infection.  Has noticed significant swelling with some drainage base of the nail plate and lateral nail border.  She does have diabetes, well-controlled.  PCP is Jimmy Ade I, MD.  No Known Allergies  Review of Systems: Negative except as noted in the HPI.  Objective:  There were no vitals filed for this visit.  [name] is a pleasant 48 y.o. female in NAD. AAO x 3.  Vascular Examination: Capillary refill time is less than 3 seconds to toes bilateral. Palpable pedal pulses b/l LE. Digital hair present b/l. No pedal edema b/l. Skin temperature gradient WNL b/l. No varicosities b/l. No cyanosis or clubbing noted b/l.   Dermatological Examination: There is incurvation of the right hallux lateral nail border.  There is pain on palpation of the affected nail border.  Edema and fluctuance affecting the lateral nail border and base of the nail bed, scant drainage noted.  Neurological Examination: Protective sensation intact with Semmes-Weinstein 10 gram monofilament b/l LE. Vibratory sensation intact b/l LE.     Latest Ref Rng & Units 04/14/2024   10:28 AM 02/11/2024   11:03 AM 10/19/2023   11:27 AM  Hemoglobin A1C  Hemoglobin-A1c 4.6 - 6.5 % 6.8  6.3  6.7      Assessment/Plan: 1. Paronychia of great toe of right foot     Meds ordered this encounter  Medications   mupirocin  ointment (BACTROBAN ) 2 %    Sig: Apply 1 Application topically 2 (two) times daily.    Dispense:  30 g    Refill:  2   cephALEXin  (KEFLEX ) 500 MG  capsule    Sig: Take 1 capsule (500 mg total) by mouth 3 (three) times daily for 7 days.    Dispense:  21 capsule    Refill:  0  # Paronychia of great toe right foot with cellulitis - Course of oral Keflex  sent to patient's pharmacy for 1 week, 3 times a day - Mupirocin  sent to patient's pharmacy to be applied to the nailbed and toenail avulsion site - Discussed proceeding with total nail avulsion due to the extent of base of the nail involvement. Temporary without chemical due to infection present -Given that she has had multiple recurrence of ingrown toenail right first toe, surgical treatment could be considered going forward if there are further problems. -Written consent obtained -I certify that this diagnosis represents a distinct and separate diagnosis that requires evaluation and treatment separate from other procedures or diagnosis    Discussed patient's condition today.  After obtaining patient consent, the right hallux was anesthetized with a 50:50 mixture of 1% lidocaine  plain and 0.5% bupivacaine  plain for a total of 3cc's administered.  Betadine skin prep. Upon confirmation of anesthesia, a freer elevator was utilized to free the right hallux nail plate from the nail bed.  The nail plate was then avulsed proximal to the eponychium and removed in toto.  The area was inspected for any remaining spicules. There was significant granuloma formation about  the lateral nail fold which was excised.  No chemical matrixectomy was performed. No significant purulence was noted. Antibiotic ointment and a DSD were applied, followed by a Coban dressing.  Patient tolerated the anesthetic and procedure well and will f/u in 2-3 weeks for recheck.  Patient given post-procedure instructions for daily 20-minute Epsom salt soaks, antibiotic ointment and daily use of Bandaids until toe starts to dry / form eschar.    Return in about 2 weeks (around 08/14/2024) for Nail Check.   Ethan Saddler, DPM,  AACFAS [facility]     [address] [place], [place] [address]                Office [phone]  Fax [phone]

## 2024-07-31 NOTE — Patient Instructions (Signed)

## 2024-08-04 ENCOUNTER — Encounter: Payer: Self-pay | Admitting: Podiatry

## 2024-08-29 ENCOUNTER — Ambulatory Visit: Admitting: Podiatry

## 2024-08-29 ENCOUNTER — Encounter: Payer: Self-pay | Admitting: Podiatry

## 2024-08-29 DIAGNOSIS — L03031 Cellulitis of right toe: Secondary | ICD-10-CM

## 2024-08-29 DIAGNOSIS — L02611 Cutaneous abscess of right foot: Secondary | ICD-10-CM

## 2024-08-29 NOTE — Progress Notes (Signed)
 "      Chief Complaint  Patient presents with   Ingrown Toenail    2-3 wk F/U nail check right looks much better    HPI: 49 y.o. female presents today following up for right hallux total nail avulsion.  Doing well.  No significant pain.  She has continued applying ointment to the area.  She completed her antibiotics without any issue.  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    Past Surgical History:  Procedure Laterality Date   CESAREAN SECTION     DILITATION & CURRETTAGE/HYSTROSCOPY WITH HYDROTHERMAL ABLATION N/A 10/08/2014   Procedure: DILATATION & CURETTAGE/HYSTEROSCOPY WITH HYDROTHERMAL ABLATION;  Surgeon: Gloris DELENA Hugger, MD;  Location: [facility];  Service: Gynecology;  Laterality: N/A;   IR ANGIOGRAM PELVIS SELECTIVE OR SUPRASELECTIVE  12/03/2019   IR ANGIOGRAM PELVIS SELECTIVE OR SUPRASELECTIVE  12/03/2019   IR ANGIOGRAM SELECTIVE EACH ADDITIONAL VESSEL  12/03/2019   IR ANGIOGRAM SELECTIVE EACH ADDITIONAL VESSEL  12/03/2019   IR EMBO TUMOR ORGAN ISCHEMIA INFARCT INC GUIDE ROADMAPPING  12/03/2019   IR RADIOLOGIST EVAL & MGMT  10/10/2017   IR RADIOLOGIST EVAL & MGMT  11/28/2017   IR RADIOLOGIST EVAL & MGMT  12/12/2017   IR RADIOLOGIST EVAL & MGMT  08/07/2019   IR RADIOLOGIST EVAL & MGMT  10/15/2019   IR SINUS/FIST TUBE CHK-NON GI  12/26/2017   IR US  GUIDE VASC ACCESS LEFT  12/03/2019   IR US  GUIDE VASC ACCESS RIGHT  12/03/2019   MYOMECTOMY      Allergies[1]  ROS    Physical Exam: There were no vitals  filed for this visit.  General: The patient is alert and oriented x3 in no acute distress.  Dermatology: Right first toe total nail avulsion site healing well.  Still some area of slight maceration and incomplete healing at base of the nailbed towards eponychium.  No erythema, no edema.  No signs of acute bacterial infection  Vascular: Palpable pedal pulses bilaterally. Capillary refill within normal limits.  No appreciable edema.  No erythema or calor.  Neurological: Light touch sensation grossly intact bilateral feet.   Musculoskeletal Exam: No pedal deformities noted   Assessment/Plan of Care: 1. Paronychia of great toe of right foot   2. Cellulitis and abscess of toe of right foot      No orders of the defined types were placed in this encounter.  None  Discussed clinical findings with patient today.  Total nail avulsion site is healing well.  Instructed the patient to discontinue the use of any antibacterial ointment as this is keeping the area macerated and allowing scab formation.  She can leave the toe open to air when at home to help facilitate with this.  At this point no concern for residual cellulitis.  No further antibiotics.  Did discuss for monitor for signs of abnormal nail regrowth.  In talking to the patient she reports 1 prior attempt at chemical matrixectomy and we would consider this again if she has  recurrent ingrown toenails.   Toya Palacios L. Lamount MAUL, AACFAS [facility]     [address] [place], [place] [address]                Office [phone]  Fax [phone]       [1] No Known Allergies  "

## 2024-09-01 ENCOUNTER — Other Ambulatory Visit: Payer: Self-pay

## 2024-09-01 NOTE — Telephone Encounter (Signed)
 Copied from CRM [id number]. Topic: Clinical - Medication Refill >> Sep 01, 2024 11:58 AM Drema MATSU wrote: Medication: XARELTO  20 MG TABS tablet  Has the patient contacted their pharmacy? Yes (Agent: If no, request that the patient contact the pharmacy for the refill. If patient does not wish to contact the pharmacy document the reason why and proceed with request.) out of refills  (Agent: If yes, when and what did the pharmacy advise?)  This is the patient's preferred pharmacy:  [id number] - [place],  - [address] AT [facility] [address] [address] [place] [address] Phone: [phone] Fax: [phone]  Is this the correct pharmacy for this prescription? Yes If no, delete pharmacy and type the correct one.   Has the prescription been filled recently? Yes  Is the patient out of the medication? Yes  Has the patient been seen for an appointment in the last year OR does the patient have an upcoming appointment? Yes upcoming 01/27  Can we respond through MyChart? Yes  Agent: Please be advised that Rx refills may take up to 3 business days. We ask that you follow-up with your pharmacy.

## 2024-09-02 MED ORDER — RIVAROXABAN 20 MG PO TABS: 20.0000 mg | ORAL_TABLET | Freq: Every day | ORAL | 3 refills | Status: AC

## 2024-09-23 ENCOUNTER — Ambulatory Visit

## 2024-09-23 VITALS — BP 122/84 | HR 120 | Temp 98.6°F | Ht 68.5 in | Wt 236.0 lb

## 2024-09-23 DIAGNOSIS — E7800 Pure hypercholesterolemia, unspecified: Secondary | ICD-10-CM | POA: Diagnosis not present

## 2024-09-23 DIAGNOSIS — E1159 Type 2 diabetes mellitus with other circulatory complications: Secondary | ICD-10-CM | POA: Diagnosis not present

## 2024-09-23 DIAGNOSIS — Z7984 Long term (current) use of oral hypoglycemic drugs: Secondary | ICD-10-CM | POA: Diagnosis not present

## 2024-09-23 DIAGNOSIS — E669 Obesity, unspecified: Secondary | ICD-10-CM | POA: Diagnosis not present

## 2024-09-23 DIAGNOSIS — E611 Iron deficiency: Secondary | ICD-10-CM | POA: Diagnosis not present

## 2024-09-23 DIAGNOSIS — Z23 Encounter for immunization: Secondary | ICD-10-CM

## 2024-09-23 DIAGNOSIS — D6861 Antiphospholipid syndrome: Secondary | ICD-10-CM

## 2024-09-23 LAB — POCT GLYCOSYLATED HEMOGLOBIN (HGB A1C): Hemoglobin A1C: 6.1 % — AB (ref 4.0–5.6)

## 2024-09-23 MED ORDER — LISINOPRIL 2.5 MG PO TABS: 2.5000 mg | ORAL_TABLET | Freq: Every day | ORAL | 3 refills | Status: AC

## 2024-09-23 MED ORDER — SILVER SULFADIAZINE 1 % EX CREA: TOPICAL_CREAM | CUTANEOUS | 0 refills | Status: AC

## 2024-09-23 MED ORDER — RYBELSUS 7 MG PO TABS: 14.0000 mg | ORAL_TABLET | Freq: Every day | ORAL | Status: AC

## 2024-09-23 MED ORDER — ATORVASTATIN CALCIUM 40 MG PO TABS: 40.0000 mg | ORAL_TABLET | Freq: Every day | ORAL | 3 refills | Status: AC

## 2024-09-23 NOTE — Patient Instructions (Addendum)
 Thank you for visiting [facility] today! Here's what we talked about: - START taking 2 tablets Rybelsus , start Lipitor and Lisinopril  - After you turn 49, get shingles from pharmacy

## 2024-09-23 NOTE — Progress Notes (Unsigned)
 "  Subjective:   This visit was conducted in person. The patient gave informed consent to the use of Abridge AI technology to record the contents of the encounter as documented below.   Patient ID: [name], female    DOB: [birth year], 49 y.o.   MRN: [id number]   Discussed the use of AI scribe software for clinical note transcription with the patient, who gave verbal consent to proceed.  History of Present Illness [name] is a 49 year old female with antiphospholipid syndrome and diabetes who presents for follow-up of her chronic conditions.  She has a history of recurrent blood clots in both legs, with episodes in 2007, 2009, 2015, and 2019, following a myomectomy. She was diagnosed with antiphospholipid syndrome after hematological evaluations, including a positive lupus anticoagulant test. She is currently on Xarelto  daily for anticoagulation.  She has a history of anemia, previously requiring a blood transfusion. Recent labs from August 2025 showed no anemia from a hemoglobin standpoint, but her iron  levels are to be checked as she does not currently take a supplement.  She has been experiencing weight gain, which she attributes to her diabetes. Her blood sugar levels have been in the pre-diabetic range, transitioning to diabetes a few months ago. Her A1c was 6.8 a few months ago, now 6.1. She takes Rybelsus  7 mg daily before eating in the morning and monitors her blood sugar at home in the morning and evening. She experiences occasional nausea with the medication but states her body has adjusted.  She takes norethindrone  for fibroids and bleeding issues, exacerbated by Xarelto  use. Norethindrone  helps offset external bleeding, which was previously severe.  She experiences headaches, which she suspects may be related to her blood pressure. She does not currently take blood pressure medication and lacks a home blood pressure monitor. She also reported transient  arm pain a few weeks ago, which has resolved.  She has undergone several procedures, including a myomectomy, uterine embolization in 2021, and dilation and curettage. She has no family history of breast, colon, or ovarian cancer.  She rarely consumes alcohol, has never smoked or vaped, and lives with her husband and 5 year old daughter. 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      Review of Systems  All other systems reviewed and are negative.       Allergies[1]  Medications Ordered Prior to Encounter[2]  BP 122/84 (BP Location: Left Arm, Patient Position: Sitting, Cuff Size: Large)   Pulse (!) 120   Temp 98.6 F (37 C) (Oral)   Ht 5' 8.5 (1.74 m)   Wt 236 lb (107 kg)   SpO2 96%   BMI 35.36 kg/m   Objective:      Physical Exam MEASUREMENTS: BMI- 35.0. GENERAL: Alert, cooperative, well developed, no acute distress. HEAD: Normocephalic atraumatic. EYES: Extraocular movements intact BL, pupils round, equal and reactive to light BL, conjunctivae normal BL. EXTREMITIES: No cyanosis or edema. NEUROLOGICAL: Oriented to person, place and time, no gait abnormalities, moves all extremities without gross motor or sensory deficit.         Assessment & Plan:   Assessment & Plan Type 2 diabetes mellitus with circulatory complications Improved glycemic control with A1c at 6.1. Rybelsus  well-tolerated. Discussed increasing Rybelsus  to 14 mg and potential side effects. Considered injectable GLP-1s but patient prefers oral medication. Discussed lisinopril  for kidney  protection and Lipitor for cardiovascular risk reduction.  Current A1c: 6.1 Last A1c: 6.8 Med regimen: Ryblesus Urine microalbumin: UTD Eye exam: UTD Foot exam: UTD Ace-i/ARB: Started Statin: Started Influenza and Pneumococcal vaccines:  Neuropathy: No Nephropathy:  No Retinopathy: No  Diet: Improving Weight concerns: Yes BMI 35   - Increased Rybelsus  to 14 mg daily by taking two 7 mg tablets. - Started lisinopril  2.5 mg daily for kidney protection. - Started Lipitor for cholesterol management. - Administered flu shot today. - Will schedule pneumonia vaccine within the next week or two. - Will schedule shingles vaccine after turning 49.   Iron  deficiency Hx of anemia. No current iron  supplementation. Ordered labs for monitoring - Ordered iron  studies with fasting labs.   Obesity BMI 35. Weight loss of 10-15 pounds so far over the past year. Rybelsus  aids weight loss, increased as below - Increased Rybelsus  to 14 mg daily by taking two 7 mg tablets.  HLD Elevated cholesterol on lipid panel several months ago, ASCVD risk elevated at 10.7, either way should be on statin due to comorbid DM.  Initiated as below. - Started atorvastatin  40 mg daily     Return in about 6 months (around 03/23/2025) for Diabetes then also on 04/14/2025 for CPE with fasting labds 1 wk prior.   [name] K Jaise Moser, MD  09/23/24     Contains text generated by Abridge.        [1] No Known Allergies [2]  Current Outpatient Medications on File Prior to Visit  Medication Sig Dispense Refill   ACCU-CHEK GUIDE TEST test strip USE AS DIRECTED IN THE MORNING, AT NOON AND AT BEDTIME 100 strip 3   Blood Glucose Monitoring Suppl DEVI 1 each by Does not apply route in the morning, at noon, and at bedtime. May substitute to any manufacturer covered by patient's insurance. 1 each 0   norethindrone  (AYGESTIN ) 5 MG tablet TAKE 15MG  ONCE PER DAY UNTIL BLEEDING STOPS. ONCE BLEEDING STOPS CAN GO BACK 10 MG. 270 tablet 1   rivaroxaban  (XARELTO ) 20 MG TABS tablet Take 1 tablet (20 mg total) by mouth daily with supper. 90 tablet 3   No current facility-administered medications on file prior to visit.   "

## 2024-09-24 LAB — IBC + FERRITIN
Ferritin: 85.5 ng/mL (ref 10.0–291.0)
Iron: 108 ug/dL (ref 42–145)
Saturation Ratios: 19 % — ABNORMAL LOW (ref 20.0–50.0)
TIBC: 567 ug/dL — ABNORMAL HIGH (ref 250.0–450.0)
Transferrin: 405 mg/dL — ABNORMAL HIGH (ref 212.0–360.0)

## 2024-09-25 DIAGNOSIS — E7800 Pure hypercholesterolemia, unspecified: Secondary | ICD-10-CM | POA: Insufficient documentation

## 2024-09-28 ENCOUNTER — Ambulatory Visit: Payer: Self-pay

## 2024-09-28 DIAGNOSIS — E611 Iron deficiency: Secondary | ICD-10-CM | POA: Insufficient documentation

## 2024-09-28 MED ORDER — IRON (FERROUS SULFATE) 325 (65 FE) MG PO TABS: 325.0000 mg | ORAL_TABLET | ORAL | 3 refills | Status: AC

## 2025-03-23 ENCOUNTER — Ambulatory Visit

## 2025-09-14 ENCOUNTER — Other Ambulatory Visit

## 2025-09-24 ENCOUNTER — Encounter
